# Patient Record
Sex: Female | Born: 1935
Health system: Southern US, Community
[De-identification: ages and names within clinical notes are randomized; demographics above are authoritative.]

## PROBLEM LIST (undated history)

## (undated) DIAGNOSIS — M1990 Unspecified osteoarthritis, unspecified site: Secondary | ICD-10-CM

## (undated) DIAGNOSIS — I5022 Chronic systolic (congestive) heart failure: Secondary | ICD-10-CM

## (undated) DIAGNOSIS — E785 Hyperlipidemia, unspecified: Secondary | ICD-10-CM

## (undated) DIAGNOSIS — H409 Unspecified glaucoma: Secondary | ICD-10-CM

## (undated) DIAGNOSIS — N3090 Cystitis, unspecified without hematuria: Secondary | ICD-10-CM

## (undated) DIAGNOSIS — I480 Paroxysmal atrial fibrillation: Secondary | ICD-10-CM

## (undated) DIAGNOSIS — H547 Unspecified visual loss: Secondary | ICD-10-CM

## (undated) DIAGNOSIS — I10 Essential (primary) hypertension: Secondary | ICD-10-CM

## (undated) DIAGNOSIS — I639 Cerebral infarction, unspecified: Secondary | ICD-10-CM

## (undated) DIAGNOSIS — I6381 Other cerebral infarction due to occlusion or stenosis of small artery: Secondary | ICD-10-CM

## (undated) DIAGNOSIS — E079 Disorder of thyroid, unspecified: Secondary | ICD-10-CM

## (undated) DIAGNOSIS — G8191 Hemiplegia, unspecified affecting right dominant side: Secondary | ICD-10-CM

## (undated) HISTORY — DX: Cystitis, unspecified without hematuria: N30.90

## (undated) HISTORY — DX: Essential (primary) hypertension: I10

## (undated) HISTORY — DX: Hemiplegia, unspecified affecting right dominant side: G81.91

## (undated) HISTORY — PX: TONSILLECTOMY AND ADENOIDECTOMY: SUR1326

## (undated) HISTORY — DX: Disorder of thyroid, unspecified: E07.9

## (undated) HISTORY — DX: Cerebral infarction, unspecified: I63.9

## (undated) HISTORY — DX: Unspecified glaucoma: H40.9

## (undated) HISTORY — DX: Hyperlipidemia, unspecified: E78.5

## (undated) HISTORY — DX: Unspecified osteoarthritis, unspecified site: M19.90

## (undated) HISTORY — PX: VAGINAL PROLAPSE REPAIR: SHX830

## (undated) HISTORY — DX: Other cerebral infarction due to occlusion or stenosis of small artery: I63.81

---

## 1974-02-23 HISTORY — PX: THYROID SURGERY: SHX805

## 1998-11-27 ENCOUNTER — Other Ambulatory Visit: Admission: RE | Admit: 1998-11-27 | Discharge: 1998-11-27 | Payer: Self-pay | Admitting: Cardiology

## 1999-05-21 ENCOUNTER — Encounter: Payer: Self-pay | Admitting: Cardiology

## 1999-05-21 ENCOUNTER — Encounter: Admission: RE | Admit: 1999-05-21 | Discharge: 1999-05-21 | Payer: Self-pay | Admitting: Cardiology

## 2000-01-28 ENCOUNTER — Ambulatory Visit (HOSPITAL_COMMUNITY): Admission: RE | Admit: 2000-01-28 | Discharge: 2000-01-28 | Payer: Self-pay | Admitting: Cardiovascular Disease

## 2000-05-15 ENCOUNTER — Encounter: Admission: RE | Admit: 2000-05-15 | Discharge: 2000-05-15 | Payer: Self-pay | Admitting: Orthopaedic Surgery

## 2000-05-15 ENCOUNTER — Encounter: Payer: Self-pay | Admitting: Orthopaedic Surgery

## 2000-05-25 ENCOUNTER — Encounter: Payer: Self-pay | Admitting: Internal Medicine

## 2000-05-25 ENCOUNTER — Encounter: Admission: RE | Admit: 2000-05-25 | Discharge: 2000-05-25 | Payer: Self-pay | Admitting: Internal Medicine

## 2000-05-26 ENCOUNTER — Encounter: Admission: RE | Admit: 2000-05-26 | Discharge: 2000-08-24 | Payer: Self-pay | Admitting: Orthopaedic Surgery

## 2000-05-27 ENCOUNTER — Encounter: Admission: RE | Admit: 2000-05-27 | Discharge: 2000-05-27 | Payer: Self-pay | Admitting: Orthopaedic Surgery

## 2000-05-27 ENCOUNTER — Encounter: Payer: Self-pay | Admitting: Orthopaedic Surgery

## 2000-06-11 ENCOUNTER — Encounter: Admission: RE | Admit: 2000-06-11 | Discharge: 2000-06-11 | Payer: Self-pay | Admitting: Orthopaedic Surgery

## 2000-06-11 ENCOUNTER — Encounter: Payer: Self-pay | Admitting: Orthopaedic Surgery

## 2000-06-25 ENCOUNTER — Encounter: Payer: Self-pay | Admitting: Orthopaedic Surgery

## 2000-06-25 ENCOUNTER — Encounter: Admission: RE | Admit: 2000-06-25 | Discharge: 2000-06-25 | Payer: Self-pay | Admitting: Orthopaedic Surgery

## 2000-08-11 ENCOUNTER — Ambulatory Visit (HOSPITAL_COMMUNITY): Admission: RE | Admit: 2000-08-11 | Discharge: 2000-08-11 | Payer: Self-pay | Admitting: Internal Medicine

## 2000-08-12 ENCOUNTER — Ambulatory Visit (HOSPITAL_COMMUNITY): Admission: RE | Admit: 2000-08-12 | Discharge: 2000-08-12 | Payer: Self-pay | Admitting: Internal Medicine

## 2000-08-12 ENCOUNTER — Encounter: Payer: Self-pay | Admitting: Internal Medicine

## 2000-08-19 ENCOUNTER — Encounter: Payer: Self-pay | Admitting: Internal Medicine

## 2000-09-07 ENCOUNTER — Other Ambulatory Visit: Admission: RE | Admit: 2000-09-07 | Discharge: 2000-09-07 | Payer: Self-pay | Admitting: Gynecology

## 2000-09-15 ENCOUNTER — Encounter: Admission: RE | Admit: 2000-09-15 | Discharge: 2000-09-15 | Payer: Self-pay | Admitting: Gynecology

## 2000-09-15 ENCOUNTER — Encounter: Payer: Self-pay | Admitting: Gynecology

## 2000-09-23 ENCOUNTER — Other Ambulatory Visit: Admission: RE | Admit: 2000-09-23 | Discharge: 2000-09-23 | Payer: Self-pay | Admitting: Gynecology

## 2000-11-11 ENCOUNTER — Encounter: Payer: Self-pay | Admitting: General Surgery

## 2000-11-11 ENCOUNTER — Encounter: Admission: RE | Admit: 2000-11-11 | Discharge: 2000-11-11 | Payer: Self-pay | Admitting: General Surgery

## 2001-03-07 ENCOUNTER — Ambulatory Visit (HOSPITAL_COMMUNITY): Admission: RE | Admit: 2001-03-07 | Discharge: 2001-03-07 | Payer: Self-pay | Admitting: General Surgery

## 2001-03-07 ENCOUNTER — Encounter: Payer: Self-pay | Admitting: General Surgery

## 2001-03-23 ENCOUNTER — Ambulatory Visit (HOSPITAL_COMMUNITY): Admission: RE | Admit: 2001-03-23 | Discharge: 2001-03-23 | Payer: Self-pay | Admitting: Gastroenterology

## 2001-06-28 ENCOUNTER — Encounter: Admission: RE | Admit: 2001-06-28 | Discharge: 2001-06-28 | Payer: Self-pay | Admitting: Internal Medicine

## 2001-06-28 ENCOUNTER — Encounter: Payer: Self-pay | Admitting: Internal Medicine

## 2001-09-20 ENCOUNTER — Other Ambulatory Visit: Admission: RE | Admit: 2001-09-20 | Discharge: 2001-09-20 | Payer: Self-pay | Admitting: Gynecology

## 2002-09-06 ENCOUNTER — Encounter: Payer: Self-pay | Admitting: Gynecology

## 2002-09-06 ENCOUNTER — Encounter: Admission: RE | Admit: 2002-09-06 | Discharge: 2002-09-06 | Payer: Self-pay | Admitting: Gynecology

## 2005-03-08 ENCOUNTER — Emergency Department (HOSPITAL_COMMUNITY): Admission: EM | Admit: 2005-03-08 | Discharge: 2005-03-08 | Payer: Self-pay | Admitting: Emergency Medicine

## 2005-05-28 ENCOUNTER — Encounter: Admission: RE | Admit: 2005-05-28 | Discharge: 2005-05-28 | Payer: Self-pay | Admitting: Internal Medicine

## 2006-01-17 ENCOUNTER — Emergency Department (HOSPITAL_COMMUNITY): Admission: EM | Admit: 2006-01-17 | Discharge: 2006-01-17 | Payer: Self-pay | Admitting: Emergency Medicine

## 2009-10-04 ENCOUNTER — Ambulatory Visit: Payer: Self-pay | Admitting: Cardiovascular Disease

## 2010-07-11 NOTE — Procedures (Signed)
[facility]  Patient:    Amanda Cochran, Amanda Cochran Visit Number: [id number] MRN: [id number]          Service Type: END Location: ENDO Attending Physician:  Louie Bun Dictated by:   Everardo All Madilyn Fireman, M.D. Proc. Date: 03/23/01 Admit Date:  03/23/2001   CC:         Sharlet Salina T. Hoxworth, M.D.   Procedure Report  PROCEDURE:  Esophagogastroduodenoscopy with biopsy.  SURGEON:  John C. Madilyn Fireman, M.D.  INDICATIONS FOR PROCEDURE:  The patient undergoing colonoscopy due to right lower quadrant abdominal pain and a family history of colon polyps, who has also had refractory reflux symptoms and other dyspeptic symptoms.  The procedure is to assess for any mucosal or structural abnormality of the upper GI tract to enable better understanding and treatment of her symptoms.  DESCRIPTION OF PROCEDURE:  The patient was placed in the left lateral decubitus position, and placed on the pulse monitor with continuous low flow oxygen delivered by nasal cannula.  She was sedated with 50 mg of IV Demerol and 5 mg of IV Versed.  The Olympus video endoscope was advanced under direct vision into the oropharynx and esophagus.  The esophagus was straight and of normal caliber with the squamocolumnar line at 38 cm.  It was somewhat irregular, but there were no discrete linear erosions or exudate, and no visible stricture, ring, or hiatal hernia.  The appearance was consistent with minimal to mild erosive esophagitis.  The stomach was entered and a small amount of liquid secretions were suctioned from the fundus.  Retroflexed view of the cardia was unremarkable.  The fundus and body appeared normal.  The antrum showed a few patches of erythema and granularity consistent with mild gastritis with no focal erosions or ulcers.  A CLOtest was obtained.  The pylorus was not deformed and easily allowed passage of the endoscope tip into the duodenum, but the bulb and second portion were  well-inspected and appeared to be within normal limits.  The endoscope was then withdrawn and the patient returned to the recovery room in stable condition.  She tolerated the procedure well and there were no immediate complications.  IMPRESSION:  Minimal esophagitis and minimal to mild gastritis.  PLAN:  Await CLOtest and treat for irradication of Helicobacter if positive. Otherwise, consider proton pump inhibitor in place of her H2 blocker. Dictated by:   Everardo All Madilyn Fireman, M.D. Attending Physician:  Louie Bun DD:  03/23/01 TD:  03/24/01 Job: [id number]

## 2010-07-11 NOTE — Cardiovascular Report (Signed)
[place]  Patient:    Amanda Cochran, Amanda Cochran                         MRN: [id number] Proc. Date: 01/28/00 Adm. Date:  [id number] Disc. Date: [id number] Attending:  Koren Bound CC:         [facility]   Cardiac Catheterization  HISTORY:  Ms. Panchal is a 77 year old female with a history of chest pain and exertional dyspnea.  She is referred for heart catheterization for further evaluation.  ACCESS:  The right femoral artery was easily cannulated using a modified Seldinger technique.  HEMODYNAMICS:  The LV pressure is 189/18 with an aortic pressure of 187/70.  ANGIOGRAPHY:  The left main coronary artery is relatively short and is otherwise smooth and normal.  The left anterior descending artery is a moderate-sized vessel.  It is smooth and normal throughout its course.  It gives off a large diagonal vessel and then tapers fairly significantly as it reaches the apex.  There are no discrete stenoses.  The left circumflex artery is a moderate-sized vessel.  It gives off a high obtuse marginal artery.  The distal left circumflex artery is fairly tortuous but is otherwise normal.  There are no discrete stenoses.  The right coronary artery has minor luminal irregularities.  There are diffuse 10-20% stenoses in the proximal and mid-segments.  The posterior descending artery and the posterolateral segment artery are unremarkable.  LEFT VENTRICULOGRAM:  The left ventriculogram was performed in a 30 RAO position.  It reveals overall normal left ventricular systolic function with an ejection fraction of 60%.  There is no mitral regurgitation.  An aortic root shot was performed because of her very brisk pulses.  There was no evidence of aortic insufficiency.  COMPLICATIONS:  None.  CONCLUSIONS 1. Minor coronary irregularities. 2. Normal left ventricular systolic function. 3. No evidence of aortic insufficiency. DD:  02/27/00 TD:   02/27/00 Job: [id number]

## 2010-07-11 NOTE — Procedures (Signed)
[facility]  Patient:    Amanda Cochran, PACCIONE Visit Number: [id number] MRN: [id number]          Service Type: END Location: ENDO Attending Physician:  Louie Bun Dictated by:   Everardo All Madilyn Fireman, M.D. Proc. Date: 03/23/01 Admit Date:  03/23/2001   CC:         Sharlet Salina T. Hoxworth, M.D.   Procedure Report  PROCEDURE:  Colonoscopy.  SURGEON:  John C. Madilyn Fireman, M.D.  INDICATIONS FOR PROCEDURE:  Right lower quadrant abdominal pain and family history of colon polyps in a first degree relative.  DESCRIPTION OF PROCEDURE:  The patient was placed in the left lateral decubitus position and placed on the pulse monitor with continuous low flow oxygen delivered by nasal cannula.  She was sedated with 20 mg of IV Demerol and 2 mg of IV Versed in addition to the medicine given for the previous EGD. The Olympus video colonoscope was inserted into the rectum and advanced to the cecum, confirmed by transillumination at McBurneys point, and visualization of the ileocecal valve and appendiceal orifice.  The prep was excellent.  The terminal ileum was intubated and explored for several centimeters and appeared to be within normal limits.  The cecum, ascending, and transverse colon appeared normal with no masses, polyps, diverticula, or other mucosal abnormalities.  In the descending and sigmoid colon, there were seen several scattered diverticula with no other abnormality seen.  The rectum appeared normal and retroflexed view of the anus revealed no obvious ________ or internal hemorrhoids.  The colonoscope was then withdrawn and the patient returned to the recovery room in stable condition.  She tolerated the procedure well and there were no immediate complications.  IMPRESSION:  Diverticulosis, otherwise normal colonoscopy.  PLAN:  Consider repeat colonoscopy or flexible sigmoidoscopy in five years. Dictated by:   Everardo All Madilyn Fireman, M.D. Attending Physician:  Louie Bun DD:  03/23/01 TD:  03/24/01 Job: [id number]

## 2011-05-25 DIAGNOSIS — H4011X0 Primary open-angle glaucoma, stage unspecified: Secondary | ICD-10-CM | POA: Diagnosis not present

## 2011-05-25 DIAGNOSIS — H409 Unspecified glaucoma: Secondary | ICD-10-CM | POA: Diagnosis not present

## 2011-05-25 DIAGNOSIS — H25019 Cortical age-related cataract, unspecified eye: Secondary | ICD-10-CM | POA: Diagnosis not present

## 2011-05-25 DIAGNOSIS — H353190 Nonexudative age-related macular degeneration, unspecified eye, stage unspecified: Secondary | ICD-10-CM | POA: Diagnosis not present

## 2011-06-09 DIAGNOSIS — H4010X0 Unspecified open-angle glaucoma, stage unspecified: Secondary | ICD-10-CM | POA: Diagnosis not present

## 2011-06-09 DIAGNOSIS — H2510 Age-related nuclear cataract, unspecified eye: Secondary | ICD-10-CM | POA: Diagnosis not present

## 2011-06-09 DIAGNOSIS — H353190 Nonexudative age-related macular degeneration, unspecified eye, stage unspecified: Secondary | ICD-10-CM | POA: Diagnosis not present

## 2011-06-24 DIAGNOSIS — M1990 Unspecified osteoarthritis, unspecified site: Secondary | ICD-10-CM | POA: Diagnosis not present

## 2011-06-24 DIAGNOSIS — M899 Disorder of bone, unspecified: Secondary | ICD-10-CM | POA: Diagnosis not present

## 2011-06-24 DIAGNOSIS — R7301 Impaired fasting glucose: Secondary | ICD-10-CM | POA: Diagnosis not present

## 2011-06-24 DIAGNOSIS — I10 Essential (primary) hypertension: Secondary | ICD-10-CM | POA: Diagnosis not present

## 2011-06-24 DIAGNOSIS — E039 Hypothyroidism, unspecified: Secondary | ICD-10-CM | POA: Diagnosis not present

## 2011-06-24 DIAGNOSIS — M949 Disorder of cartilage, unspecified: Secondary | ICD-10-CM | POA: Diagnosis not present

## 2011-07-16 DIAGNOSIS — L723 Sebaceous cyst: Secondary | ICD-10-CM | POA: Diagnosis not present

## 2011-07-16 DIAGNOSIS — L570 Actinic keratosis: Secondary | ICD-10-CM | POA: Diagnosis not present

## 2011-07-22 DIAGNOSIS — H353190 Nonexudative age-related macular degeneration, unspecified eye, stage unspecified: Secondary | ICD-10-CM | POA: Diagnosis not present

## 2011-07-22 DIAGNOSIS — H409 Unspecified glaucoma: Secondary | ICD-10-CM | POA: Diagnosis not present

## 2011-07-22 DIAGNOSIS — H4011X0 Primary open-angle glaucoma, stage unspecified: Secondary | ICD-10-CM | POA: Diagnosis not present

## 2011-07-24 DIAGNOSIS — H409 Unspecified glaucoma: Secondary | ICD-10-CM | POA: Diagnosis not present

## 2011-07-24 DIAGNOSIS — H4011X0 Primary open-angle glaucoma, stage unspecified: Secondary | ICD-10-CM | POA: Diagnosis not present

## 2011-08-05 DIAGNOSIS — H409 Unspecified glaucoma: Secondary | ICD-10-CM | POA: Diagnosis not present

## 2011-08-05 DIAGNOSIS — H2510 Age-related nuclear cataract, unspecified eye: Secondary | ICD-10-CM | POA: Diagnosis not present

## 2011-08-05 DIAGNOSIS — H4011X0 Primary open-angle glaucoma, stage unspecified: Secondary | ICD-10-CM | POA: Diagnosis not present

## 2011-09-18 DIAGNOSIS — M25559 Pain in unspecified hip: Secondary | ICD-10-CM | POA: Diagnosis not present

## 2011-09-18 DIAGNOSIS — M47814 Spondylosis without myelopathy or radiculopathy, thoracic region: Secondary | ICD-10-CM | POA: Diagnosis not present

## 2011-09-18 DIAGNOSIS — M47817 Spondylosis without myelopathy or radiculopathy, lumbosacral region: Secondary | ICD-10-CM | POA: Diagnosis not present

## 2011-09-18 DIAGNOSIS — M5137 Other intervertebral disc degeneration, lumbosacral region: Secondary | ICD-10-CM | POA: Diagnosis not present

## 2011-10-12 DIAGNOSIS — H4011X0 Primary open-angle glaucoma, stage unspecified: Secondary | ICD-10-CM | POA: Diagnosis not present

## 2011-10-12 DIAGNOSIS — H409 Unspecified glaucoma: Secondary | ICD-10-CM | POA: Diagnosis not present

## 2011-11-02 ENCOUNTER — Encounter: Payer: Self-pay | Admitting: Cardiovascular Disease

## 2011-11-19 DIAGNOSIS — D235 Other benign neoplasm of skin of trunk: Secondary | ICD-10-CM | POA: Diagnosis not present

## 2011-11-25 ENCOUNTER — Encounter: Payer: Self-pay | Admitting: Cardiovascular Disease

## 2011-12-23 DIAGNOSIS — Z23 Encounter for immunization: Secondary | ICD-10-CM | POA: Diagnosis not present

## 2012-02-02 DIAGNOSIS — H4011X0 Primary open-angle glaucoma, stage unspecified: Secondary | ICD-10-CM | POA: Diagnosis not present

## 2012-02-02 DIAGNOSIS — H409 Unspecified glaucoma: Secondary | ICD-10-CM | POA: Diagnosis not present

## 2012-03-01 DIAGNOSIS — M899 Disorder of bone, unspecified: Secondary | ICD-10-CM | POA: Diagnosis not present

## 2012-03-01 DIAGNOSIS — R82998 Other abnormal findings in urine: Secondary | ICD-10-CM | POA: Diagnosis not present

## 2012-03-01 DIAGNOSIS — I10 Essential (primary) hypertension: Secondary | ICD-10-CM | POA: Diagnosis not present

## 2012-03-01 DIAGNOSIS — M949 Disorder of cartilage, unspecified: Secondary | ICD-10-CM | POA: Diagnosis not present

## 2012-03-01 DIAGNOSIS — E039 Hypothyroidism, unspecified: Secondary | ICD-10-CM | POA: Diagnosis not present

## 2012-03-01 DIAGNOSIS — E785 Hyperlipidemia, unspecified: Secondary | ICD-10-CM | POA: Diagnosis not present

## 2012-03-01 DIAGNOSIS — R7301 Impaired fasting glucose: Secondary | ICD-10-CM | POA: Diagnosis not present

## 2012-03-05 DIAGNOSIS — R6889 Other general symptoms and signs: Secondary | ICD-10-CM | POA: Diagnosis not present

## 2012-03-08 DIAGNOSIS — G459 Transient cerebral ischemic attack, unspecified: Secondary | ICD-10-CM | POA: Diagnosis not present

## 2012-03-08 DIAGNOSIS — Z0000 Encounter for general adult medical examination without abnormal findings: Secondary | ICD-10-CM | POA: Diagnosis not present

## 2012-03-08 DIAGNOSIS — M1990 Unspecified osteoarthritis, unspecified site: Secondary | ICD-10-CM | POA: Diagnosis not present

## 2012-03-08 DIAGNOSIS — I4949 Other premature depolarization: Secondary | ICD-10-CM | POA: Diagnosis not present

## 2012-03-08 DIAGNOSIS — Z124 Encounter for screening for malignant neoplasm of cervix: Secondary | ICD-10-CM | POA: Diagnosis not present

## 2012-03-09 DIAGNOSIS — J342 Deviated nasal septum: Secondary | ICD-10-CM | POA: Diagnosis not present

## 2012-03-09 DIAGNOSIS — Z8673 Personal history of transient ischemic attack (TIA), and cerebral infarction without residual deficits: Secondary | ICD-10-CM | POA: Diagnosis not present

## 2012-03-14 DIAGNOSIS — Z1212 Encounter for screening for malignant neoplasm of rectum: Secondary | ICD-10-CM | POA: Diagnosis not present

## 2012-03-22 DIAGNOSIS — I658 Occlusion and stenosis of other precerebral arteries: Secondary | ICD-10-CM | POA: Diagnosis not present

## 2012-03-22 DIAGNOSIS — G459 Transient cerebral ischemic attack, unspecified: Secondary | ICD-10-CM | POA: Diagnosis not present

## 2012-03-22 DIAGNOSIS — I6350 Cerebral infarction due to unspecified occlusion or stenosis of unspecified cerebral artery: Secondary | ICD-10-CM | POA: Diagnosis not present

## 2012-07-05 DIAGNOSIS — H409 Unspecified glaucoma: Secondary | ICD-10-CM | POA: Diagnosis not present

## 2012-07-05 DIAGNOSIS — H4011X0 Primary open-angle glaucoma, stage unspecified: Secondary | ICD-10-CM | POA: Diagnosis not present

## 2012-11-21 DIAGNOSIS — D235 Other benign neoplasm of skin of trunk: Secondary | ICD-10-CM | POA: Diagnosis not present

## 2012-11-21 DIAGNOSIS — L570 Actinic keratosis: Secondary | ICD-10-CM | POA: Diagnosis not present

## 2012-11-21 DIAGNOSIS — D485 Neoplasm of uncertain behavior of skin: Secondary | ICD-10-CM | POA: Diagnosis not present

## 2012-12-22 DIAGNOSIS — Z23 Encounter for immunization: Secondary | ICD-10-CM | POA: Diagnosis not present

## 2013-02-07 DIAGNOSIS — H409 Unspecified glaucoma: Secondary | ICD-10-CM | POA: Diagnosis not present

## 2013-02-07 DIAGNOSIS — H4011X0 Primary open-angle glaucoma, stage unspecified: Secondary | ICD-10-CM | POA: Diagnosis not present

## 2013-02-23 HISTORY — PX: OTHER SURGICAL HISTORY: SHX169

## 2013-03-21 DIAGNOSIS — E785 Hyperlipidemia, unspecified: Secondary | ICD-10-CM | POA: Diagnosis not present

## 2013-03-21 DIAGNOSIS — E559 Vitamin D deficiency, unspecified: Secondary | ICD-10-CM | POA: Diagnosis not present

## 2013-03-21 DIAGNOSIS — R82998 Other abnormal findings in urine: Secondary | ICD-10-CM | POA: Diagnosis not present

## 2013-03-21 DIAGNOSIS — I10 Essential (primary) hypertension: Secondary | ICD-10-CM | POA: Diagnosis not present

## 2013-03-21 DIAGNOSIS — E039 Hypothyroidism, unspecified: Secondary | ICD-10-CM | POA: Diagnosis not present

## 2013-03-21 DIAGNOSIS — R7301 Impaired fasting glucose: Secondary | ICD-10-CM | POA: Diagnosis not present

## 2013-03-28 DIAGNOSIS — Z23 Encounter for immunization: Secondary | ICD-10-CM | POA: Diagnosis not present

## 2013-03-28 DIAGNOSIS — IMO0002 Reserved for concepts with insufficient information to code with codable children: Secondary | ICD-10-CM | POA: Diagnosis not present

## 2013-03-28 DIAGNOSIS — Z0000 Encounter for general adult medical examination without abnormal findings: Secondary | ICD-10-CM | POA: Diagnosis not present

## 2013-03-28 DIAGNOSIS — E039 Hypothyroidism, unspecified: Secondary | ICD-10-CM | POA: Diagnosis not present

## 2013-03-28 DIAGNOSIS — E785 Hyperlipidemia, unspecified: Secondary | ICD-10-CM | POA: Diagnosis not present

## 2013-03-28 DIAGNOSIS — E559 Vitamin D deficiency, unspecified: Secondary | ICD-10-CM | POA: Diagnosis not present

## 2013-03-28 DIAGNOSIS — F3289 Other specified depressive episodes: Secondary | ICD-10-CM | POA: Diagnosis not present

## 2013-03-28 DIAGNOSIS — M25519 Pain in unspecified shoulder: Secondary | ICD-10-CM | POA: Diagnosis not present

## 2013-03-28 DIAGNOSIS — I10 Essential (primary) hypertension: Secondary | ICD-10-CM | POA: Diagnosis not present

## 2013-03-28 DIAGNOSIS — F329 Major depressive disorder, single episode, unspecified: Secondary | ICD-10-CM | POA: Diagnosis not present

## 2013-03-31 DIAGNOSIS — Z1212 Encounter for screening for malignant neoplasm of rectum: Secondary | ICD-10-CM | POA: Diagnosis not present

## 2013-04-05 ENCOUNTER — Emergency Department (HOSPITAL_COMMUNITY): Payer: Medicare Other

## 2013-04-05 ENCOUNTER — Emergency Department (HOSPITAL_COMMUNITY)
Admission: EM | Admit: 2013-04-05 | Discharge: 2013-04-05 | Disposition: A | Discharge status: Home / Self Care | Payer: Medicare Other | Attending: Emergency Medicine | Admitting: Emergency Medicine

## 2013-04-05 ENCOUNTER — Encounter (HOSPITAL_COMMUNITY): Payer: Self-pay | Admitting: Emergency Medicine

## 2013-04-05 DIAGNOSIS — Z87448 Personal history of other diseases of urinary system: Secondary | ICD-10-CM | POA: Insufficient documentation

## 2013-04-05 DIAGNOSIS — Z79899 Other long term (current) drug therapy: Secondary | ICD-10-CM | POA: Diagnosis not present

## 2013-04-05 DIAGNOSIS — R51 Headache: Secondary | ICD-10-CM | POA: Diagnosis not present

## 2013-04-05 DIAGNOSIS — R112 Nausea with vomiting, unspecified: Secondary | ICD-10-CM | POA: Diagnosis not present

## 2013-04-05 DIAGNOSIS — Z8673 Personal history of transient ischemic attack (TIA), and cerebral infarction without residual deficits: Secondary | ICD-10-CM | POA: Diagnosis not present

## 2013-04-05 DIAGNOSIS — R209 Unspecified disturbances of skin sensation: Secondary | ICD-10-CM | POA: Insufficient documentation

## 2013-04-05 DIAGNOSIS — I10 Essential (primary) hypertension: Secondary | ICD-10-CM | POA: Insufficient documentation

## 2013-04-05 DIAGNOSIS — E785 Hyperlipidemia, unspecified: Secondary | ICD-10-CM | POA: Insufficient documentation

## 2013-04-05 DIAGNOSIS — R1110 Vomiting, unspecified: Secondary | ICD-10-CM | POA: Diagnosis not present

## 2013-04-05 DIAGNOSIS — R42 Dizziness and giddiness: Secondary | ICD-10-CM | POA: Diagnosis not present

## 2013-04-05 DIAGNOSIS — Z7982 Long term (current) use of aspirin: Secondary | ICD-10-CM | POA: Diagnosis not present

## 2013-04-05 DIAGNOSIS — M129 Arthropathy, unspecified: Secondary | ICD-10-CM | POA: Insufficient documentation

## 2013-04-05 DIAGNOSIS — H409 Unspecified glaucoma: Secondary | ICD-10-CM | POA: Insufficient documentation

## 2013-04-05 DIAGNOSIS — E079 Disorder of thyroid, unspecified: Secondary | ICD-10-CM | POA: Diagnosis not present

## 2013-04-05 LAB — CBC
HEMATOCRIT: 45.9 % (ref 36.0–46.0)
Hemoglobin: 15.6 g/dL — ABNORMAL HIGH (ref 12.0–15.0)
MCH: 29.6 pg (ref 26.0–34.0)
MCHC: 34 g/dL (ref 30.0–36.0)
MCV: 87.1 fL (ref 78.0–100.0)
PLATELETS: 279 10*3/uL (ref 150–400)
RBC: 5.27 MIL/uL — ABNORMAL HIGH (ref 3.87–5.11)
RDW: 14.1 % (ref 11.5–15.5)
WBC: 11.6 10*3/uL — AB (ref 4.0–10.5)

## 2013-04-05 LAB — URINALYSIS, ROUTINE W REFLEX MICROSCOPIC
Bilirubin Urine: NEGATIVE
Glucose, UA: NEGATIVE mg/dL
KETONES UR: 15 mg/dL — AB
NITRITE: NEGATIVE
Protein, ur: NEGATIVE mg/dL
SPECIFIC GRAVITY, URINE: 1.019 (ref 1.005–1.030)
UROBILINOGEN UA: 0.2 mg/dL (ref 0.0–1.0)
pH: 8 (ref 5.0–8.0)

## 2013-04-05 LAB — POCT I-STAT, CHEM 8
BUN: 16 mg/dL (ref 6–23)
CALCIUM ION: 1.2 mmol/L (ref 1.13–1.30)
CHLORIDE: 105 meq/L (ref 96–112)
CREATININE: 0.9 mg/dL (ref 0.50–1.10)
Glucose, Bld: 105 mg/dL — ABNORMAL HIGH (ref 70–99)
HCT: 49 % — ABNORMAL HIGH (ref 36.0–46.0)
Hemoglobin: 16.7 g/dL — ABNORMAL HIGH (ref 12.0–15.0)
POTASSIUM: 3.8 meq/L (ref 3.7–5.3)
Sodium: 146 mEq/L (ref 137–147)
TCO2: 24 mmol/L (ref 0–100)

## 2013-04-05 LAB — HEPATIC FUNCTION PANEL
ALBUMIN: 4.1 g/dL (ref 3.5–5.2)
ALT: 14 U/L (ref 0–35)
AST: 25 U/L (ref 0–37)
Alkaline Phosphatase: 62 U/L (ref 39–117)
Bilirubin, Direct: <0.2 mg/dL (ref 0.0–0.3)
TOTAL PROTEIN: 8 g/dL (ref 6.0–8.3)
Total Bilirubin: 0.2 mg/dL — ABNORMAL LOW (ref 0.3–1.2)

## 2013-04-05 LAB — BASIC METABOLIC PANEL
BUN: 17 mg/dL (ref 6–23)
CALCIUM: 9.9 mg/dL (ref 8.4–10.5)
CO2: 23 mEq/L (ref 19–32)
CREATININE: 0.81 mg/dL (ref 0.50–1.10)
Chloride: 105 mEq/L (ref 96–112)
GFR calc Af Amer: 76 mL/min — ABNORMAL LOW (ref >90)
GFR calc non Af Amer: 66 mL/min — ABNORMAL LOW (ref >90)
GLUCOSE: 104 mg/dL — AB (ref 70–99)
Potassium: 4.1 mEq/L (ref 3.7–5.3)
SODIUM: 145 meq/L (ref 137–147)

## 2013-04-05 LAB — URINE MICROSCOPIC-ADD ON

## 2013-04-05 LAB — POCT I-STAT TROPONIN I: Troponin i, poc: 0.04 ng/mL (ref 0.00–0.08)

## 2013-04-05 MED ORDER — MECLIZINE HCL 25 MG PO TABS
25.0000 mg | ORAL_TABLET | Freq: Once | ORAL | Status: AC
  Administered 2013-04-05: 25 mg via ORAL
  Filled 2013-04-05: qty 1

## 2013-04-05 MED ORDER — MECLIZINE HCL 25 MG PO TABS: 25.0000 mg | ORAL_TABLET | Freq: Four times a day (QID) | ORAL | Status: DC

## 2013-04-05 MED ORDER — ONDANSETRON 4 MG PO TBDP: 4.0000 mg | ORAL_TABLET | Freq: Three times a day (TID) | ORAL | Status: DC | PRN

## 2013-04-05 MED ORDER — ONDANSETRON HCL 4 MG/2ML IJ SOLN
4.0000 mg | Freq: Once | INTRAMUSCULAR | Status: AC
  Administered 2013-04-05: 4 mg via INTRAVENOUS
  Filled 2013-04-05: qty 2

## 2013-04-05 NOTE — Discharge Instructions (Signed)
MRI of the brain without any new acute findings. Cleared by neurology for discharge home. Make an appointment with your regular Dr. for followup. Make an appointment with ear nose and throat for evaluation of the ears. Take the anti-GERD as directed take Zofran as needed for nausea and vomiting. Return for any newer worse symptoms.   Benign Positional Vertigo Vertigo means you feel like you or your surroundings are moving when they are not. Benign positional vertigo is the most common form of vertigo. Benign means that the cause of your condition is not serious. Benign positional vertigo is more common in older adults. CAUSES  Benign positional vertigo is the result of an upset in the labyrinth system. This is an area in the middle ear that helps control your balance. This may be caused by a viral infection, head injury, or repetitive motion. However, often no specific cause is found. SYMPTOMS  Symptoms of benign positional vertigo occur when you move your head or eyes in different directions. Some of the symptoms may include:  Loss of balance and falls.  Vomiting.  Blurred vision.  Dizziness.  Nausea.  Involuntary eye movements (nystagmus). DIAGNOSIS  Benign positional vertigo is usually diagnosed by physical exam. If the specific cause of your benign positional vertigo is unknown, your caregiver may perform imaging tests, such as magnetic resonance imaging (MRI) or computed tomography (CT). TREATMENT  Your caregiver may recommend movements or procedures to correct the benign positional vertigo. Medicines such as meclizine, benzodiazepines, and medicines for nausea may be used to treat your symptoms. In rare cases, if your symptoms are caused by certain conditions that affect the inner ear, you may need surgery. HOME CARE INSTRUCTIONS   Follow your caregiver's instructions.  Move slowly. Do not make sudden body or head movements.  Avoid driving.  Avoid operating heavy  machinery.  Avoid performing any tasks that would be dangerous to you or others during a vertigo episode.  Drink enough fluids to keep your urine clear or pale yellow. SEEK IMMEDIATE MEDICAL CARE IF:   You develop problems with walking, weakness, numbness, or using your arms, hands, or legs.  You have difficulty speaking.  You develop severe headaches.  Your nausea or vomiting continues or gets worse.  You develop visual changes.  Your family or friends notice any behavioral changes.  Your condition gets worse.  You have a fever.  You develop a stiff neck or sensitivity to light. MAKE SURE YOU:   Understand these instructions.  Will watch your condition.  Will get help right away if you are not doing well or get worse. Document Released: 11/17/2005 Document Revised: 05/04/2011 Document Reviewed: 10/30/2010 [facility] Patient Information 2014 [place].

## 2013-04-05 NOTE — ED Notes (Signed)
Reminded Patient we need a urine sample.  Patient stated she was so dry.  Patient has call light at bedside.

## 2013-04-05 NOTE — ED Provider Notes (Signed)
CSN: [id number]     Arrival date & time 04/05/13  1404 History   First MD Initiated Contact with Patient 04/05/13 1512     Chief Complaint  Patient presents with  . Dizziness     (Consider location/radiation/quality/duration/timing/severity/associated sxs/prior Treatment) Patient is a 80 y.o. female presenting with dizziness. The history is provided by the patient.  Dizziness Associated symptoms: headaches, nausea and vomiting   Associated symptoms: no chest pain and no shortness of breath    patient with acute onset of dizziness with vertigo this morning around 4. It persisted throughout the day patient eventually got her neighbor to bring her in. Patient complains of some dizziness mild headache nausea vomited once. Patient had similar episodes of dizziness vertigo 13 years ago but none since. Patient has a history of strokes. No new focal deficits. Patient does have a some numbness to the left arm that is chronic in nature.  Past Medical History  Diagnosis Date  . Thyroid disease   . Hyperlipidemia   . Hypertension   . Glaucoma   . Arthritis   . Bladder infection    Past Surgical History  Procedure Laterality Date  . Thyroid surgery  1976  . Tonsillectomy and adenoidectomy    . Vaginal prolapse repair     No family history on file. History  Substance Use Topics  . Smoking status: Never Smoker   . Smokeless tobacco: Not on file  . Alcohol Use: No   OB History   Grav Para Term Preterm Abortions TAB SAB Ect Mult Living                 Review of Systems  Constitutional: Negative for fever and fatigue.  HENT: Negative for ear pain.   Eyes: Negative for visual disturbance.  Respiratory: Negative for shortness of breath.   Cardiovascular: Negative for chest pain.  Gastrointestinal: Positive for nausea and vomiting. Negative for abdominal pain.  Genitourinary: Negative for dysuria.  Musculoskeletal: Negative for back pain.  Skin: Negative for rash.  Neurological:  Positive for dizziness, numbness and headaches.  Psychiatric/Behavioral: Negative for confusion.      Allergies  Review of patient's allergies indicates no known allergies.  Home Medications   Current Outpatient Rx  Name  Route  Sig  Dispense  Refill  . aspirin 325 MG tablet   Oral   Take 325 mg by mouth at bedtime.         . benazepril (LOTENSIN) 40 MG tablet   Oral   Take 40 mg by mouth daily.         . brinzolamide (AZOPT) 1 % ophthalmic suspension   Both Eyes   Place 1 drop into both eyes 2 (two) times daily.          . Calcium Carbonate (CALTRATE 600 PO)   Oral   Take by mouth. 1 tab twice a day         . Cholecalciferol (VITAMIN D) 2000 UNITS CAPS   Oral   Take 1 capsule by mouth daily.         Marland Kitchen escitalopram (LEXAPRO) 10 MG tablet   Oral   Take 10 mg by mouth at bedtime.         . Fish Oil-Cholecalciferol (FISH OIL + D3) 1200-1000 MG-UNIT CAPS   Oral   Take by mouth.         . latanoprost (XALATAN) 0.005 % ophthalmic solution      1 drop at bedtime.         Marland Kitchen  levothyroxine (SYNTHROID, LEVOTHROID) 100 MCG tablet   Oral   Take 100 mcg by mouth daily.         . Multiple Vitamins-Minerals (CENTRUM SILVER PO)   Oral   Take 1 tablet by mouth daily.         . simvastatin (ZOCOR) 40 MG tablet   Oral   Take 40 mg by mouth every evening.         . meclizine (ANTIVERT) 25 MG tablet   Oral   Take 1 tablet (25 mg total) by mouth 4 (four) times daily.   28 tablet   0   . ondansetron (ZOFRAN ODT) 4 MG disintegrating tablet   Oral   Take 1 tablet (4 mg total) by mouth every 8 (eight) hours as needed for nausea or vomiting.   20 tablet   0    BP 164/68  Pulse 39  Temp(Src) 97.6 F (36.4 C) (Oral)  Resp 16  Wt 153 lb (69.4 kg)  SpO2 93% Physical Exam  Nursing note and vitals reviewed. Constitutional: She is oriented to person, place, and time. She appears well-developed and well-nourished. No distress.  HENT:  Head:  Normocephalic and atraumatic.  Mouth/Throat: Oropharynx is clear and moist.  Eyes: Conjunctivae and EOM are normal. Pupils are equal, round, and reactive to light.  Neck: Normal range of motion. Neck supple.  Cardiovascular: Normal rate, regular rhythm and normal heart sounds.   Pulmonary/Chest: Effort normal and breath sounds normal. No respiratory distress.  Abdominal: Soft. Bowel sounds are normal. There is no tenderness.  Musculoskeletal: Normal range of motion. She exhibits no edema.  Neurological: She is alert and oriented to person, place, and time. No cranial nerve deficit. She exhibits normal muscle tone. Coordination normal.  Skin: Skin is warm. No rash noted.    ED Course  Procedures (including critical care time) Labs Review Labs Reviewed  CBC - Abnormal; Notable for the following:    WBC 11.6 (*)    RBC 5.27 (*)    Hemoglobin 15.6 (*)    All other components within normal limits  BASIC METABOLIC PANEL - Abnormal; Notable for the following:    Glucose, Bld 104 (*)    GFR calc non Af Amer 66 (*)    GFR calc Af Amer 76 (*)    All other components within normal limits  URINALYSIS, ROUTINE W REFLEX MICROSCOPIC - Abnormal; Notable for the following:    APPearance CLOUDY (*)    Hgb urine dipstick TRACE (*)    Ketones, ur 15 (*)    Leukocytes, UA LARGE (*)    All other components within normal limits  HEPATIC FUNCTION PANEL - Abnormal; Notable for the following:    Total Bilirubin 0.2 (*)    All other components within normal limits  URINE MICROSCOPIC-ADD ON - Abnormal; Notable for the following:    Squamous Epithelial / LPF MANY (*)    Bacteria, UA MANY (*)    All other components within normal limits  POCT I-STAT, CHEM 8 - Abnormal; Notable for the following:    Glucose, Bld 105 (*)    Hemoglobin 16.7 (*)    HCT 49.0 (*)    All other components within normal limits  URINE CULTURE  POCT I-STAT TROPONIN I   Results for orders placed during the hospital encounter of  04/05/13  CBC      Result Value Ref Range   WBC 11.6 (*) 4.0 - 10.5 K/uL   RBC 5.27 (*) 3.87 -  5.11 MIL/uL   Hemoglobin 15.6 (*) 12.0 - 15.0 g/dL   HCT 45.9  36.0 - 46.0 %   MCV 87.1  78.0 - 100.0 fL   MCH 29.6  26.0 - 34.0 pg   MCHC 34.0  30.0 - 36.0 g/dL   RDW 14.1  11.5 - 15.5 %   Platelets 279  150 - 400 K/uL  BASIC METABOLIC PANEL      Result Value Ref Range   Sodium 145  137 - 147 mEq/L   Potassium 4.1  3.7 - 5.3 mEq/L   Chloride 105  96 - 112 mEq/L   CO2 23  19 - 32 mEq/L   Glucose, Bld 104 (*) 70 - 99 mg/dL   BUN 17  6 - 23 mg/dL   Creatinine, Ser 0.81  0.50 - 1.10 mg/dL   Calcium 9.9  8.4 - 10.5 mg/dL   GFR calc non Af Amer 66 (*) >90 mL/min   GFR calc Af Amer 76 (*) >90 mL/min  URINALYSIS, ROUTINE W REFLEX MICROSCOPIC      Result Value Ref Range   Color, Urine YELLOW  YELLOW   APPearance CLOUDY (*) CLEAR   Specific Gravity, Urine 1.019  1.005 - 1.030   pH 8.0  5.0 - 8.0   Glucose, UA NEGATIVE  NEGATIVE mg/dL   Hgb urine dipstick TRACE (*) NEGATIVE   Bilirubin Urine NEGATIVE  NEGATIVE   Ketones, ur 15 (*) NEGATIVE mg/dL   Protein, ur NEGATIVE  NEGATIVE mg/dL   Urobilinogen, UA 0.2  0.0 - 1.0 mg/dL   Nitrite NEGATIVE  NEGATIVE   Leukocytes, UA LARGE (*) NEGATIVE  HEPATIC FUNCTION PANEL      Result Value Ref Range   Total Protein 8.0  6.0 - 8.3 g/dL   Albumin 4.1  3.5 - 5.2 g/dL   AST 25  0 - 37 U/L   ALT 14  0 - 35 U/L   Alkaline Phosphatase 62  39 - 117 U/L   Total Bilirubin 0.2 (*) 0.3 - 1.2 mg/dL   Bilirubin, Direct <0.2  0.0 - 0.3 mg/dL   Indirect Bilirubin NOT CALCULATED  0.3 - 0.9 mg/dL  URINE MICROSCOPIC-ADD ON      Result Value Ref Range   Squamous Epithelial / LPF MANY (*) RARE   WBC, UA 11-20  <3 WBC/hpf   RBC / HPF 0-2  <3 RBC/hpf   Bacteria, UA MANY (*) RARE  POCT I-STAT, CHEM 8      Result Value Ref Range   Sodium 146  137 - 147 mEq/L   Potassium 3.8  3.7 - 5.3 mEq/L   Chloride 105  96 - 112 mEq/L   BUN 16  6 - 23 mg/dL   Creatinine,  Ser 0.90  0.50 - 1.10 mg/dL   Glucose, Bld 105 (*) 70 - 99 mg/dL   Calcium, Ion 1.20  1.13 - 1.30 mmol/L   TCO2 24  0 - 100 mmol/L   Hemoglobin 16.7 (*) 12.0 - 15.0 g/dL   HCT 49.0 (*) 36.0 - 46.0 %  POCT I-STAT TROPONIN I      Result Value Ref Range   Troponin i, poc 0.04  0.00 - 0.08 ng/mL   Comment 3             Imaging Review Dg Chest 2 View  04/05/2013   CLINICAL DATA:  Dizziness since 4 a.m. with emesis twice  EXAM: CHEST  2 VIEW  COMPARISON:  None.  FINDINGS: Heart  size upper normal. Vascular pattern normal. Lungs clear. No effusions.  IMPRESSION: No acute findings   Electronically Signed   By: Skipper Cliche M.D.   On: 04/05/2013 16:03   Ct Head Wo Contrast  04/05/2013   CLINICAL DATA:  Dizziness.  Vomiting.  History of stroke.  EXAM: CT HEAD WITHOUT CONTRAST  TECHNIQUE: Contiguous axial images were obtained from the base of the skull through the vertex without intravenous contrast.  COMPARISON:  None.  FINDINGS: The brain does not show accelerated atrophy for age. There chronic small vessel changes within the hemispheric white matter. There is an old right parietal cortical and subcortical infarction. There are old small vessel infarctions within the basal ganglia on the right and the right thalamus. No sign of acute infarction, mass lesion, hemorrhage, hydrocephalus or extra-axial collection. Sinuses, middle ears and mastoids are clear. No calvarial abnormality. Frontal scalp sebaceous cyst incidentally noted.  IMPRESSION: No identifiable acute abnormality.  Chronic small vessel disease affecting the hemispheric white matter, right thalamus and basal ganglia. Old right parietal cortical and subcortical infarction.   Electronically Signed   By: Nelson Chimes M.D.   On: 04/05/2013 16:15   Mr Brain Wo Contrast  04/05/2013   CLINICAL DATA:  80 year old female who awoke with dizziness. Numbness in the left upper extremity. Headache nausea and vomiting. Initial encounter.  EXAM: MRI HEAD  WITHOUT CONTRAST  TECHNIQUE: Multiplanar, multiecho pulse sequences of the brain and surrounding structures were obtained without intravenous contrast.  COMPARISON:  Head CT without contrast 04/05/2013.  FINDINGS: No restricted diffusion or evidence of acute infarction. Major intracranial vascular flow voids are preserved.  No restricted diffusion to suggest acute infarction. No midline shift, mass effect, evidence of mass lesion, ventriculomegaly, extra-axial collection or acute intracranial hemorrhage. Cervicomedullary junction and pituitary are within normal limits. Negative visualized cervical spine.  Small chronic cortically based infarct in the right parietal lobe with encephalomalacia (series 6, image 15). Smaller chronic cortically based infarct in the posterior right temporal lobe nearby (series 7, image 13). Subtle chronic cortically based infarct with encephalomalacia right occipital pole. Superimposed scattered mild to moderate for age cerebral white matter T2 and FLAIR hyperintensity. Deep gray matter nuclei Within normal limits for age. Brainstem and cerebellum within normal limits for age.  The right internal auditory structures appear grossly normal. The left or not well visualized (series 6, image 5). Mastoids are clear. Visualized orbit soft tissues are within normal limits.  Mild to moderate paranasal sinus mucosal thickening. Visualized scalp soft tissues are within normal limits. Normal bone marrow signal.  IMPRESSION: 1.  No acute intracranial abnormality. 2. Several small chronic infarcts in the posterior right MCA and right PCA territories. Superimposed cerebral white matter chronic small vessel disease. 3. Left internal auditory structures not well visualized with this technique. If dizziness/vertigo persists, consider nonemergent follow-up IAC protocol brain MRI.   Electronically Signed   By: Lars Pinks M.D.   On: 04/05/2013 19:43    EKG Interpretation    Date/Time:  Wednesday April 05 2013 14:13:46 EST Ventricular Rate:  91 PR Interval:  176 QRS Duration: 100 QT Interval:  378 QTC Calculation: 464 R Axis:   55 Text Interpretation:  Sinus rhythm with frequent Premature ventricular complexes Cannot rule out Anterior infarct , age undetermined Abnormal ECG No previous ECGs available Confirmed by   MD,  ([id number]) on 04/05/2013 3:15:06 PM            MDM   Final diagnoses:  Vertigo  Patient with extensive workup for the vertigo. MRI without acute changes. Consultation with neurology is given their blessing for her to be discharged home. Patient improved significantly here with antinausea medicine and fiber. We'll continue both. Followup with your nose and throat to evaluate the ear canals more carefully has been arranged. Patient also followup with her primary care Dr.    Mervin Kung, MD 04/05/13 [phone]

## 2013-04-05 NOTE — ED Notes (Signed)
Pt reports that she woke up at 0400 this morning with dizziness so bad that she couldn't get out of bed along with numbness in her left arm. Around 0500 she was able to get out of bed and do her daily activities. Pt lives alone but does not drive called her neighbor at 3 and came here. Reports currently is dizzy, headache, nauseated. She has vomited x 1. Pt is a x 4 is following commands. No droop noted, neighbor reports her speech sounds different.

## 2013-04-05 NOTE — Consult Note (Signed)
NEURO HOSPITALIST CONSULT NOTE    Reason for Consult: acute vertigo  HPI:                                                                                                                                          Amanda Cochran is an 80 y.o. female, right handed, with a past medical history significant for HTN, hyperlipidemia, thyroid disease, ischemic stroke 2015 (left sided numbness), comes in today due to acute onset vertigo. She recalls having a previous episode of " room spinning but not so severe" 13 years ago. This morning, she woke up with a severe, unpleasant spinning sensation " every time I tried to get up". She said that few hours after onset of such sensation she became very nauseated and started vomiting. She tells me that the spinning sensation will get significantly better if her head and neck remain still and worse every time she attempted to move her body. Walking was a major challenge this morning and she had to hold to the bed or walls. Denies double vision, difficulty swallowing, focal weakness, slurred speech, language or visual disturbances. No tinnitus or other new ear symptoms. No recent fever, infection, head or neck trauma. CT brain revealed no acute lesions but oldold right parietal cortical and subcortical infarction. MRI-DWI brain showed no acute abnormality.The right internal auditory structures appear grossly normal but the left is not well visualized  Feels better, more comfortable after receiving zofran IV and meclizine.   Past Medical History  Diagnosis Date  . Thyroid disease   . Hyperlipidemia   . Hypertension   . Glaucoma   . Arthritis   . Bladder infection     Past Surgical History  Procedure Laterality Date  . Thyroid surgery  1976  . Tonsillectomy and adenoidectomy    . Vaginal prolapse repair      No family history on file.   Social History:  reports that she has never smoked. She does not have any smokeless tobacco  history on file. She reports that she does not drink alcohol or use illicit drugs.  No Known Allergies  MEDICATIONS:  I have reviewed the patient's current medications.   ROS:                                                                                                                                       History obtained from the patient and chart review.  General ROS: negative for - chills, fatigue, fever, night sweats, weight gain or weight loss Psychological ROS: negative for - behavioral disorder, hallucinations, memory difficulties, mood swings or suicidal ideation Ophthalmic ROS: negative for - blurry vision, double vision, eye pain ENT ROS: negative for - epistaxis, nasal discharge, oral lesions, sore throat, or tinnitus Allergy and Immunology ROS: negative for - hives or itchy/watery eyes Hematological and Lymphatic ROS: negative for - bleeding problems, bruising or swollen lymph nodes Endocrine ROS: negative for - galactorrhea, hair pattern changes, polydipsia/polyuria or temperature intolerance Respiratory ROS: negative for - cough, hemoptysis, shortness of breath or wheezing Cardiovascular ROS: negative for - chest pain, dyspnea on exertion, edema or irregular heartbeat Gastrointestinal ROS: negative for - abdominal pain, diarrhea, hematemesis, nausea/vomiting or stool incontinence Genito-Urinary ROS: negative for - dysuria, hematuria, incontinence or urinary frequency/urgency Musculoskeletal ROS: negative for - joint swelling or muscular weakness Neurological ROS: as noted in HPI Dermatological ROS: negative for rash and skin lesion changes   Physical exam: pleasant female in no apparent distress at the time of this evaluation. Blood pressure 163/69, pulse 70, temperature 97.6 F (36.4 C), temperature source Oral, resp. rate 23, weight 69.4 kg (153 lb), SpO2  96.00%. Head: normocephalic. Neck: supple, no bruits, no JVD. Cardiac: no murmurs. Lungs: clear. Abdomen: soft, no tender, no mass. Extremities: no edema.  Neurologic Examination:                                                                                                      Mental Status: Alert, oriented, thought content appropriate.  Speech fluent without evidence of aphasia.  Able to follow 3 step commands without difficulty. Cranial Nerves: II: Discs flat bilaterally; Visual fields grossly normal, pupils equal, round, reactive to light and accommodation III,IV, VI: ptosis not present, extra-ocular motions intact bilaterally V,VII: smile symmetric, facial light touch sensation normal bilaterally VIII: hearing normal bilaterally IX,X: gag reflex present XI: bilateral shoulder shrug XII: midline tongue extension without atrophy or fasciculations  Motor: Right : Upper extremity   5/5    Left:     Upper extremity   5/5  Lower extremity   5/5     Lower extremity  5/5 Tone and bulk:normal tone throughout; no atrophy noted Sensory: Pinprick and light touch intact throughout, bilaterally Deep Tendon Reflexes:  Right: Upper Extremity   Left: Upper extremity   biceps (C-5 to C-6) 2/4   biceps (C-5 to C-6) 2/4 tricep (C7) 2/4    triceps (C7) 2/4 Brachioradialis (C6) 2/4  Brachioradialis (C6) 2/4  Lower Extremity Lower Extremity  quadriceps (L-2 to L-4) 2/4   quadriceps (L-2 to L-4) 2/4 Achilles (S1) 2/4   Achilles (S1) 2/4  Plantars: Right: downgoing   Left: downgoing Cerebellar: normal finger-to-nose,  normal heel-to-shin test Gait:  No tested CV: pulses palpable throughout    No results found for this basename: cbc, bmp, coags, chol, tri, ldl, hga1c    Results for orders placed during the hospital encounter of 04/05/13 (from the past 48 hour(s))  CBC     Status: Abnormal   Collection Time    04/05/13  2:45 PM      Result Value Ref Range   WBC 11.6 (*) 4.0 - 10.5  K/uL   RBC 5.27 (*) 3.87 - 5.11 MIL/uL   Hemoglobin 15.6 (*) 12.0 - 15.0 g/dL   HCT 45.9  36.0 - 46.0 %   MCV 87.1  78.0 - 100.0 fL   MCH 29.6  26.0 - 34.0 pg   MCHC 34.0  30.0 - 36.0 g/dL   RDW 14.1  11.5 - 15.5 %   Platelets 279  150 - 400 K/uL  BASIC METABOLIC PANEL     Status: Abnormal   Collection Time    04/05/13  2:45 PM      Result Value Ref Range   Sodium 145  137 - 147 mEq/L   Potassium 4.1  3.7 - 5.3 mEq/L   Chloride 105  96 - 112 mEq/L   CO2 23  19 - 32 mEq/L   Glucose, Bld 104 (*) 70 - 99 mg/dL   BUN 17  6 - 23 mg/dL   Creatinine, Ser 0.81  0.50 - 1.10 mg/dL   Calcium 9.9  8.4 - 10.5 mg/dL   GFR calc non Af Amer 66 (*) >90 mL/min   GFR calc Af Amer 76 (*) >90 mL/min   Comment: (NOTE)     The eGFR has been calculated using the CKD EPI equation.     This calculation has not been validated in all clinical situations.     eGFR's persistently <90 mL/min signify possible Chronic Kidney     Disease.  HEPATIC FUNCTION PANEL     Status: Abnormal   Collection Time    04/05/13  2:45 PM      Result Value Ref Range   Total Protein 8.0  6.0 - 8.3 g/dL   Albumin 4.1  3.5 - 5.2 g/dL   AST 25  0 - 37 U/L   Comment: HEMOLYSIS AT THIS LEVEL MAY AFFECT RESULT   ALT 14  0 - 35 U/L   Alkaline Phosphatase 62  39 - 117 U/L   Total Bilirubin 0.2 (*) 0.3 - 1.2 mg/dL   Bilirubin, Direct <0.2  0.0 - 0.3 mg/dL   Indirect Bilirubin NOT CALCULATED  0.3 - 0.9 mg/dL  POCT I-STAT TROPONIN I     Status: None   Collection Time    04/05/13  3:02 PM      Result Value Ref Range   Troponin i, poc 0.04  0.00 - 0.08 ng/mL   Comment 3  Comment: Due to the release kinetics of cTnI,     a negative result within the first hours     of the onset of symptoms does not rule out     myocardial infarction with certainty.     If myocardial infarction is still suspected,     repeat the test at appropriate intervals.  POCT I-STAT, CHEM 8     Status: Abnormal   Collection Time    04/05/13  3:04  PM      Result Value Ref Range   Sodium 146  137 - 147 mEq/L   Potassium 3.8  3.7 - 5.3 mEq/L   Chloride 105  96 - 112 mEq/L   BUN 16  6 - 23 mg/dL   Creatinine, Ser 0.90  0.50 - 1.10 mg/dL   Glucose, Bld 105 (*) 70 - 99 mg/dL   Calcium, Ion 1.20  1.13 - 1.30 mmol/L   TCO2 24  0 - 100 mmol/L   Hemoglobin 16.7 (*) 12.0 - 15.0 g/dL   HCT 49.0 (*) 36.0 - 46.0 %  URINALYSIS, ROUTINE W REFLEX MICROSCOPIC     Status: Abnormal   Collection Time    04/05/13  7:33 PM      Result Value Ref Range   Color, Urine YELLOW  YELLOW   APPearance CLOUDY (*) CLEAR   Specific Gravity, Urine 1.019  1.005 - 1.030   pH 8.0  5.0 - 8.0   Glucose, UA NEGATIVE  NEGATIVE mg/dL   Hgb urine dipstick TRACE (*) NEGATIVE   Bilirubin Urine NEGATIVE  NEGATIVE   Ketones, ur 15 (*) NEGATIVE mg/dL   Protein, ur NEGATIVE  NEGATIVE mg/dL   Urobilinogen, UA 0.2  0.0 - 1.0 mg/dL   Nitrite NEGATIVE  NEGATIVE   Leukocytes, UA LARGE (*) NEGATIVE  URINE MICROSCOPIC-ADD ON     Status: Abnormal   Collection Time    04/05/13  7:33 PM      Result Value Ref Range   Squamous Epithelial / LPF MANY (*) RARE   WBC, UA 11-20  <3 WBC/hpf   RBC / HPF 0-2  <3 RBC/hpf   Bacteria, UA MANY (*) RARE    Dg Chest 2 View  04/05/2013   CLINICAL DATA:  Dizziness since 4 a.m. with emesis twice  EXAM: CHEST  2 VIEW  COMPARISON:  None.  FINDINGS: Heart size upper normal. Vascular pattern normal. Lungs clear. No effusions.  IMPRESSION: No acute findings   Electronically Signed   By: Skipper Cliche M.D.   On: 04/05/2013 16:03   Ct Head Wo Contrast  04/05/2013   CLINICAL DATA:  Dizziness.  Vomiting.  History of stroke.  EXAM: CT HEAD WITHOUT CONTRAST  TECHNIQUE: Contiguous axial images were obtained from the base of the skull through the vertex without intravenous contrast.  COMPARISON:  None.  FINDINGS: The brain does not show accelerated atrophy for age. There chronic small vessel changes within the hemispheric white matter. There is an old  right parietal cortical and subcortical infarction. There are old small vessel infarctions within the basal ganglia on the right and the right thalamus. No sign of acute infarction, mass lesion, hemorrhage, hydrocephalus or extra-axial collection. Sinuses, middle ears and mastoids are clear. No calvarial abnormality. Frontal scalp sebaceous cyst incidentally noted.  IMPRESSION: No identifiable acute abnormality.  Chronic small vessel disease affecting the hemispheric white matter, right thalamus and basal ganglia. Old right parietal cortical and subcortical infarction.   Electronically Signed   By: Elta Guadeloupe  Shogry M.D.   On: 04/05/2013 16:15   Mr Brain Wo Contrast  04/05/2013   CLINICAL DATA:  80 year old female who awoke with dizziness. Numbness in the left upper extremity. Headache nausea and vomiting. Initial encounter.  EXAM: MRI HEAD WITHOUT CONTRAST  TECHNIQUE: Multiplanar, multiecho pulse sequences of the brain and surrounding structures were obtained without intravenous contrast.  COMPARISON:  Head CT without contrast 04/05/2013.  FINDINGS: No restricted diffusion or evidence of acute infarction. Major intracranial vascular flow voids are preserved.  No restricted diffusion to suggest acute infarction. No midline shift, mass effect, evidence of mass lesion, ventriculomegaly, extra-axial collection or acute intracranial hemorrhage. Cervicomedullary junction and pituitary are within normal limits. Negative visualized cervical spine.  Small chronic cortically based infarct in the right parietal lobe with encephalomalacia (series 6, image 15). Smaller chronic cortically based infarct in the posterior right temporal lobe nearby (series 7, image 13). Subtle chronic cortically based infarct with encephalomalacia right occipital pole. Superimposed scattered mild to moderate for age cerebral white matter T2 and FLAIR hyperintensity. Deep gray matter nuclei Within normal limits for age. Brainstem and cerebellum within  normal limits for age.  The right internal auditory structures appear grossly normal. The left or not well visualized (series 6, image 5). Mastoids are clear. Visualized orbit soft tissues are within normal limits.  Mild to moderate paranasal sinus mucosal thickening. Visualized scalp soft tissues are within normal limits. Normal bone marrow signal.  IMPRESSION: 1.  No acute intracranial abnormality. 2. Several small chronic infarcts in the posterior right MCA and right PCA territories. Superimposed cerebral white matter chronic small vessel disease. 3. Left internal auditory structures not well visualized with this technique. If dizziness/vertigo persists, consider nonemergent follow-up IAC protocol brain MRI.   Electronically Signed   By: Lars Pinks M.D.   On: 04/05/2013 19:43        Assessment/Plan: 80 y/o with acute isolated vertigo worsened by head movements, non focal neuro-exam and MRI brain without evidence of posterior circulation or posterior fossa structures involvement. Poor visualization of the left IAC. Although she has risk factors for stroke, her current presentation seems to be consistent with a peripheral vertigo. In addition, MRI brain revealed no brainstem or cerebellar infarct. She is more comfortable now after receiving Zofran and meclizine and could be discharge tonight with outpatient ENT follow up to further address poorly visualized left IAC and start vestibular rehab with Eply exercises.     Dorian Pod, MD 04/05/2013, 10:21 PM [facility].

## 2013-04-05 NOTE — ED Notes (Signed)
Patient transported to MRI 

## 2013-04-05 NOTE — ED Notes (Signed)
Neurologist at the bedside 

## 2013-04-05 NOTE — ED Notes (Signed)
Pt given turkey sandwich and ice water ?

## 2013-04-06 LAB — URINE CULTURE

## 2013-04-07 DIAGNOSIS — H8110 Benign paroxysmal vertigo, unspecified ear: Secondary | ICD-10-CM | POA: Diagnosis not present

## 2013-04-07 DIAGNOSIS — Z6825 Body mass index (BMI) 25.0-25.9, adult: Secondary | ICD-10-CM | POA: Diagnosis not present

## 2013-04-27 DIAGNOSIS — H25019 Cortical age-related cataract, unspecified eye: Secondary | ICD-10-CM | POA: Diagnosis not present

## 2013-04-27 DIAGNOSIS — H2510 Age-related nuclear cataract, unspecified eye: Secondary | ICD-10-CM | POA: Diagnosis not present

## 2013-04-27 DIAGNOSIS — H353190 Nonexudative age-related macular degeneration, unspecified eye, stage unspecified: Secondary | ICD-10-CM | POA: Diagnosis not present

## 2013-06-27 DIAGNOSIS — H2589 Other age-related cataract: Secondary | ICD-10-CM | POA: Diagnosis not present

## 2013-06-27 DIAGNOSIS — H2510 Age-related nuclear cataract, unspecified eye: Secondary | ICD-10-CM | POA: Diagnosis not present

## 2013-06-27 DIAGNOSIS — H25019 Cortical age-related cataract, unspecified eye: Secondary | ICD-10-CM | POA: Diagnosis not present

## 2013-07-18 DIAGNOSIS — H2589 Other age-related cataract: Secondary | ICD-10-CM | POA: Diagnosis not present

## 2013-07-18 DIAGNOSIS — H25019 Cortical age-related cataract, unspecified eye: Secondary | ICD-10-CM | POA: Diagnosis not present

## 2013-07-18 DIAGNOSIS — H2510 Age-related nuclear cataract, unspecified eye: Secondary | ICD-10-CM | POA: Diagnosis not present

## 2013-08-23 DIAGNOSIS — H409 Unspecified glaucoma: Secondary | ICD-10-CM | POA: Diagnosis not present

## 2013-08-23 DIAGNOSIS — H4011X0 Primary open-angle glaucoma, stage unspecified: Secondary | ICD-10-CM | POA: Diagnosis not present

## 2013-09-20 DIAGNOSIS — H409 Unspecified glaucoma: Secondary | ICD-10-CM | POA: Diagnosis not present

## 2013-09-20 DIAGNOSIS — H4011X0 Primary open-angle glaucoma, stage unspecified: Secondary | ICD-10-CM | POA: Diagnosis not present

## 2013-10-02 DIAGNOSIS — I10 Essential (primary) hypertension: Secondary | ICD-10-CM | POA: Diagnosis not present

## 2013-10-02 DIAGNOSIS — E039 Hypothyroidism, unspecified: Secondary | ICD-10-CM | POA: Diagnosis not present

## 2013-10-02 DIAGNOSIS — R7301 Impaired fasting glucose: Secondary | ICD-10-CM | POA: Diagnosis not present

## 2013-10-02 DIAGNOSIS — F329 Major depressive disorder, single episode, unspecified: Secondary | ICD-10-CM | POA: Diagnosis not present

## 2013-10-02 DIAGNOSIS — Z8679 Personal history of other diseases of the circulatory system: Secondary | ICD-10-CM | POA: Diagnosis not present

## 2013-10-02 DIAGNOSIS — Z6825 Body mass index (BMI) 25.0-25.9, adult: Secondary | ICD-10-CM | POA: Diagnosis not present

## 2013-10-02 DIAGNOSIS — F3289 Other specified depressive episodes: Secondary | ICD-10-CM | POA: Diagnosis not present

## 2013-10-03 DIAGNOSIS — F329 Major depressive disorder, single episode, unspecified: Secondary | ICD-10-CM | POA: Diagnosis not present

## 2013-10-03 DIAGNOSIS — H548 Legal blindness, as defined in USA: Secondary | ICD-10-CM | POA: Diagnosis not present

## 2013-10-03 DIAGNOSIS — I10 Essential (primary) hypertension: Secondary | ICD-10-CM | POA: Diagnosis not present

## 2013-10-03 DIAGNOSIS — R279 Unspecified lack of coordination: Secondary | ICD-10-CM | POA: Diagnosis not present

## 2013-10-03 DIAGNOSIS — F3289 Other specified depressive episodes: Secondary | ICD-10-CM | POA: Diagnosis not present

## 2013-10-05 DIAGNOSIS — R279 Unspecified lack of coordination: Secondary | ICD-10-CM | POA: Diagnosis not present

## 2013-10-05 DIAGNOSIS — F3289 Other specified depressive episodes: Secondary | ICD-10-CM | POA: Diagnosis not present

## 2013-10-05 DIAGNOSIS — F329 Major depressive disorder, single episode, unspecified: Secondary | ICD-10-CM | POA: Diagnosis not present

## 2013-10-06 DIAGNOSIS — H409 Unspecified glaucoma: Secondary | ICD-10-CM | POA: Diagnosis not present

## 2013-10-06 DIAGNOSIS — H4011X0 Primary open-angle glaucoma, stage unspecified: Secondary | ICD-10-CM | POA: Diagnosis not present

## 2013-10-09 DIAGNOSIS — R279 Unspecified lack of coordination: Secondary | ICD-10-CM | POA: Diagnosis not present

## 2013-10-09 DIAGNOSIS — F3289 Other specified depressive episodes: Secondary | ICD-10-CM | POA: Diagnosis not present

## 2013-10-09 DIAGNOSIS — F329 Major depressive disorder, single episode, unspecified: Secondary | ICD-10-CM | POA: Diagnosis not present

## 2013-10-10 DIAGNOSIS — R279 Unspecified lack of coordination: Secondary | ICD-10-CM | POA: Diagnosis not present

## 2013-10-10 DIAGNOSIS — F3289 Other specified depressive episodes: Secondary | ICD-10-CM | POA: Diagnosis not present

## 2013-10-10 DIAGNOSIS — F329 Major depressive disorder, single episode, unspecified: Secondary | ICD-10-CM | POA: Diagnosis not present

## 2013-10-11 DIAGNOSIS — F3289 Other specified depressive episodes: Secondary | ICD-10-CM | POA: Diagnosis not present

## 2013-10-11 DIAGNOSIS — F329 Major depressive disorder, single episode, unspecified: Secondary | ICD-10-CM | POA: Diagnosis not present

## 2013-10-11 DIAGNOSIS — R279 Unspecified lack of coordination: Secondary | ICD-10-CM | POA: Diagnosis not present

## 2013-10-27 DIAGNOSIS — L259 Unspecified contact dermatitis, unspecified cause: Secondary | ICD-10-CM | POA: Diagnosis not present

## 2013-11-22 DIAGNOSIS — H409 Unspecified glaucoma: Secondary | ICD-10-CM | POA: Diagnosis not present

## 2013-11-22 DIAGNOSIS — H4011X0 Primary open-angle glaucoma, stage unspecified: Secondary | ICD-10-CM | POA: Diagnosis not present

## 2013-11-27 DIAGNOSIS — D225 Melanocytic nevi of trunk: Secondary | ICD-10-CM | POA: Diagnosis not present

## 2013-11-27 DIAGNOSIS — L7211 Pilar cyst: Secondary | ICD-10-CM | POA: Diagnosis not present

## 2013-11-27 DIAGNOSIS — D485 Neoplasm of uncertain behavior of skin: Secondary | ICD-10-CM | POA: Diagnosis not present

## 2013-11-27 DIAGNOSIS — L570 Actinic keratosis: Secondary | ICD-10-CM | POA: Diagnosis not present

## 2013-12-05 DIAGNOSIS — H4011X3 Primary open-angle glaucoma, severe stage: Secondary | ICD-10-CM | POA: Diagnosis not present

## 2013-12-20 DIAGNOSIS — Z283 Underimmunization status: Secondary | ICD-10-CM | POA: Diagnosis not present

## 2013-12-20 DIAGNOSIS — Z23 Encounter for immunization: Secondary | ICD-10-CM | POA: Diagnosis not present

## 2013-12-20 DIAGNOSIS — H4011X3 Primary open-angle glaucoma, severe stage: Secondary | ICD-10-CM | POA: Diagnosis not present

## 2014-01-25 DIAGNOSIS — H4011X3 Primary open-angle glaucoma, severe stage: Secondary | ICD-10-CM | POA: Diagnosis not present

## 2014-02-03 ENCOUNTER — Emergency Department (HOSPITAL_COMMUNITY): Payer: Medicare Other

## 2014-02-03 ENCOUNTER — Encounter (HOSPITAL_COMMUNITY): Payer: Self-pay | Admitting: Emergency Medicine

## 2014-02-03 ENCOUNTER — Inpatient Hospital Stay (HOSPITAL_COMMUNITY)
Admission: EM | Admit: 2014-02-03 | Discharge: 2014-02-07 | DRG: 066 | Disposition: A | Discharge status: Rehabilitation Facility | Payer: Medicare Other | Attending: Internal Medicine | Admitting: Internal Medicine

## 2014-02-03 ENCOUNTER — Inpatient Hospital Stay (HOSPITAL_COMMUNITY): Payer: Medicare Other

## 2014-02-03 DIAGNOSIS — I639 Cerebral infarction, unspecified: Secondary | ICD-10-CM | POA: Diagnosis not present

## 2014-02-03 DIAGNOSIS — I10 Essential (primary) hypertension: Secondary | ICD-10-CM | POA: Diagnosis present

## 2014-02-03 DIAGNOSIS — E039 Hypothyroidism, unspecified: Secondary | ICD-10-CM | POA: Diagnosis present

## 2014-02-03 DIAGNOSIS — H409 Unspecified glaucoma: Secondary | ICD-10-CM | POA: Diagnosis present

## 2014-02-03 DIAGNOSIS — H548 Legal blindness, as defined in USA: Secondary | ICD-10-CM | POA: Diagnosis present

## 2014-02-03 DIAGNOSIS — E785 Hyperlipidemia, unspecified: Secondary | ICD-10-CM | POA: Diagnosis present

## 2014-02-03 DIAGNOSIS — R29810 Facial weakness: Secondary | ICD-10-CM | POA: Diagnosis present

## 2014-02-03 DIAGNOSIS — R471 Dysarthria and anarthria: Secondary | ICD-10-CM | POA: Diagnosis present

## 2014-02-03 DIAGNOSIS — I517 Cardiomegaly: Secondary | ICD-10-CM

## 2014-02-03 DIAGNOSIS — I6789 Other cerebrovascular disease: Secondary | ICD-10-CM | POA: Diagnosis not present

## 2014-02-03 DIAGNOSIS — R1312 Dysphagia, oropharyngeal phase: Secondary | ICD-10-CM | POA: Diagnosis present

## 2014-02-03 DIAGNOSIS — Z8673 Personal history of transient ischemic attack (TIA), and cerebral infarction without residual deficits: Secondary | ICD-10-CM | POA: Diagnosis not present

## 2014-02-03 DIAGNOSIS — M1990 Unspecified osteoarthritis, unspecified site: Secondary | ICD-10-CM | POA: Diagnosis present

## 2014-02-03 DIAGNOSIS — Z7982 Long term (current) use of aspirin: Secondary | ICD-10-CM | POA: Diagnosis not present

## 2014-02-03 DIAGNOSIS — I6350 Cerebral infarction due to unspecified occlusion or stenosis of unspecified cerebral artery: Secondary | ICD-10-CM | POA: Diagnosis not present

## 2014-02-03 LAB — CBG MONITORING, ED: Glucose-Capillary: 120 mg/dL — ABNORMAL HIGH (ref 70–99)

## 2014-02-03 LAB — DIFFERENTIAL
Basophils Absolute: 0.1 10*3/uL (ref 0.0–0.1)
Basophils Relative: 1 % (ref 0–1)
Eosinophils Absolute: 0.3 10*3/uL (ref 0.0–0.7)
Eosinophils Relative: 3 % (ref 0–5)
Lymphocytes Relative: 36 % (ref 12–46)
Lymphs Abs: 3.3 10*3/uL (ref 0.7–4.0)
Monocytes Absolute: 0.7 10*3/uL (ref 0.1–1.0)
Monocytes Relative: 8 % (ref 3–12)
Neutro Abs: 4.7 10*3/uL (ref 1.7–7.7)
Neutrophils Relative %: 52 % (ref 43–77)

## 2014-02-03 LAB — COMPREHENSIVE METABOLIC PANEL WITH GFR
ALT: 13 U/L (ref 0–35)
AST: 25 U/L (ref 0–37)
Albumin: 3.4 g/dL — ABNORMAL LOW (ref 3.5–5.2)
Alkaline Phosphatase: 57 U/L (ref 39–117)
Anion gap: 14 (ref 5–15)
BUN: 19 mg/dL (ref 6–23)
CO2: 23 meq/L (ref 19–32)
Calcium: 9.2 mg/dL (ref 8.4–10.5)
Chloride: 105 meq/L (ref 96–112)
Creatinine, Ser: 0.86 mg/dL (ref 0.50–1.10)
GFR calc Af Amer: 70 mL/min — ABNORMAL LOW
GFR calc non Af Amer: 61 mL/min — ABNORMAL LOW
Glucose, Bld: 119 mg/dL — ABNORMAL HIGH (ref 70–99)
Potassium: 4.4 meq/L (ref 3.7–5.3)
Sodium: 142 meq/L (ref 137–147)
Total Bilirubin: 0.3 mg/dL (ref 0.3–1.2)
Total Protein: 6.9 g/dL (ref 6.0–8.3)

## 2014-02-03 LAB — I-STAT CHEM 8, ED
BUN: 20 mg/dL (ref 6–23)
CALCIUM ION: 1.1 mmol/L — AB (ref 1.13–1.30)
Chloride: 106 mEq/L (ref 96–112)
Creatinine, Ser: 0.9 mg/dL (ref 0.50–1.10)
Glucose, Bld: 120 mg/dL — ABNORMAL HIGH (ref 70–99)
HCT: 43 % (ref 36.0–46.0)
HEMOGLOBIN: 14.6 g/dL (ref 12.0–15.0)
Potassium: 3.8 mEq/L (ref 3.7–5.3)
Sodium: 140 mEq/L (ref 137–147)
TCO2: 22 mmol/L (ref 0–100)

## 2014-02-03 LAB — APTT: aPTT: 27 s (ref 24–37)

## 2014-02-03 LAB — CBC
HCT: 41.5 % (ref 36.0–46.0)
Hemoglobin: 13.6 g/dL (ref 12.0–15.0)
MCH: 28.7 pg (ref 26.0–34.0)
MCHC: 32.8 g/dL (ref 30.0–36.0)
MCV: 87.6 fL (ref 78.0–100.0)
PLATELETS: 218 10*3/uL (ref 150–400)
RBC: 4.74 MIL/uL (ref 3.87–5.11)
RDW: 14.1 % (ref 11.5–15.5)
WBC: 9.1 10*3/uL (ref 4.0–10.5)

## 2014-02-03 LAB — I-STAT TROPONIN, ED: Troponin i, poc: 0.03 ng/mL (ref 0.00–0.08)

## 2014-02-03 LAB — PROTIME-INR
INR: 1.03 (ref 0.00–1.49)
PROTHROMBIN TIME: 13.6 s (ref 11.6–15.2)

## 2014-02-03 MED ORDER — CLOPIDOGREL BISULFATE 75 MG PO TABS
75.0000 mg | ORAL_TABLET | Freq: Every day | ORAL | Status: DC
  Filled 2014-02-03: qty 1

## 2014-02-03 MED ORDER — LORAZEPAM 2 MG/ML IJ SOLN
INTRAMUSCULAR | Status: AC
Start: 2014-02-03 — End: 2014-02-03
  Filled 2014-02-03: qty 1

## 2014-02-03 MED ORDER — BRINZOLAMIDE 1 % OP SUSP
1.0000 [drp] | Freq: Two times a day (BID) | OPHTHALMIC | Status: DC
  Filled 2014-02-03: qty 10

## 2014-02-03 MED ORDER — RESOURCE THICKENUP CLEAR PO POWD
ORAL | Status: DC | PRN
  Filled 2014-02-03: qty 125

## 2014-02-03 MED ORDER — STROKE: EARLY STAGES OF RECOVERY BOOK
Freq: Once | Status: DC
  Filled 2014-02-03: qty 1

## 2014-02-03 MED ORDER — ESCITALOPRAM OXALATE 10 MG PO TABS
10.0000 mg | ORAL_TABLET | Freq: Every day | ORAL | Status: DC
  Administered 2014-02-04 – 2014-02-06 (×3): 10 mg via ORAL
  Filled 2014-02-03 (×4): qty 1

## 2014-02-03 MED ORDER — SIMVASTATIN 40 MG PO TABS
40.0000 mg | ORAL_TABLET | Freq: Every day | ORAL | Status: DC
  Administered 2014-02-03 – 2014-02-06 (×4): 40 mg via ORAL
  Filled 2014-02-03 (×4): qty 1

## 2014-02-03 MED ORDER — HYDRALAZINE HCL 20 MG/ML IJ SOLN
5.0000 mg | Freq: Four times a day (QID) | INTRAMUSCULAR | Status: DC | PRN
  Administered 2014-02-03 – 2014-02-04 (×2): 5 mg via INTRAVENOUS
  Filled 2014-02-03 (×2): qty 1

## 2014-02-03 MED ORDER — LORAZEPAM 2 MG/ML IJ SOLN
0.5000 mg | Freq: Once | INTRAMUSCULAR | Status: AC
  Administered 2014-02-03: 0.5 mg via INTRAVENOUS

## 2014-02-03 MED ORDER — LABETALOL HCL 5 MG/ML IV SOLN
10.0000 mg | Freq: Once | INTRAVENOUS | Status: AC
  Administered 2014-02-03: 10 mg via INTRAVENOUS
  Filled 2014-02-03: qty 4

## 2014-02-03 MED ORDER — SODIUM CHLORIDE 0.9 % IV SOLN: INTRAVENOUS | Status: DC

## 2014-02-03 MED ORDER — LATANOPROST 0.005 % OP SOLN
1.0000 [drp] | Freq: Every day | OPHTHALMIC | Status: DC
  Administered 2014-02-03 – 2014-02-06 (×4): 1 [drp] via OPHTHALMIC
  Filled 2014-02-03 (×2): qty 2.5

## 2014-02-03 MED ORDER — HEPARIN SODIUM (PORCINE) 5000 UNIT/ML IJ SOLN
5000.0000 [IU] | Freq: Three times a day (TID) | INTRAMUSCULAR | Status: DC
  Administered 2014-02-03 – 2014-02-07 (×13): 5000 [IU] via SUBCUTANEOUS
  Filled 2014-02-03 (×13): qty 1

## 2014-02-03 MED ORDER — ASPIRIN EC 325 MG PO TBEC
325.0000 mg | DELAYED_RELEASE_TABLET | Freq: Every day | ORAL | Status: DC
  Administered 2014-02-03 – 2014-02-04 (×2): 325 mg via ORAL
  Filled 2014-02-03 (×2): qty 1

## 2014-02-03 MED ORDER — DORZOLAMIDE HCL-TIMOLOL MAL 2-0.5 % OP SOLN
1.0000 [drp] | Freq: Two times a day (BID) | OPHTHALMIC | Status: DC
  Administered 2014-02-03 – 2014-02-07 (×9): 1 [drp] via OPHTHALMIC
  Filled 2014-02-03: qty 10

## 2014-02-03 MED ORDER — SENNOSIDES-DOCUSATE SODIUM 8.6-50 MG PO TABS: 1.0000 | ORAL_TABLET | Freq: Every evening | ORAL | Status: DC | PRN

## 2014-02-03 NOTE — ED Provider Notes (Signed)
CSN: [id number]     Arrival date & time 02/03/14  0355 History   First MD Initiated Contact with Patient 02/03/14 0359     Chief Complaint  Patient presents with  . Code Stroke    @EDPCLEARED @ (Consider location/radiation/quality/duration/timing/severity/associated sxs/prior Treatment) HPI Patient presents as code stroke. She has right-sided facial droop and slurred speech. Unknown onset. Last seen normal at roughly 10 PM. Per EMS systolic blood pressure reading as high as 270. Given 10 mg of labetalol in route. Patient denies any pain specifically any chest pain, abdominal pain, headache. No point did she have any nausea or vomiting. She's had no visual changes. Past Medical History  Diagnosis Date  . Thyroid disease   . Hyperlipidemia   . Hypertension   . Glaucoma   . Arthritis   . Bladder infection    Past Surgical History  Procedure Laterality Date  . Thyroid surgery  1976  . Tonsillectomy and adenoidectomy    . Vaginal prolapse repair     History reviewed. No pertinent family history. History  Substance Use Topics  . Smoking status: Never Smoker   . Smokeless tobacco: Not on file  . Alcohol Use: No   OB History    No data available     Review of Systems  Constitutional: Negative for fever and chills.  Eyes: Negative for visual disturbance.  Respiratory: Negative for cough and shortness of breath.   Cardiovascular: Negative for chest pain.  Gastrointestinal: Negative for nausea, vomiting, abdominal pain and diarrhea.  Musculoskeletal: Negative for myalgias, neck pain and neck stiffness.  Skin: Negative for rash and wound.  Neurological: Positive for speech difficulty. Negative for dizziness, light-headedness, numbness and headaches.  All other systems reviewed and are negative.     Allergies  Review of patient's allergies indicates no known allergies.  Home Medications   Prior to Admission medications   Medication Sig Start Date End Date Taking?  Authorizing Provider  aspirin 325 MG tablet Take 325 mg by mouth at bedtime.   Yes Historical Provider, MD  benazepril (LOTENSIN) 40 MG tablet Take 40 mg by mouth daily.   Yes Historical Provider, MD  Calcium Carbonate (CALTRATE 600 PO) Take 1 tablet by mouth daily.    Yes Historical Provider, MD  Cholecalciferol (VITAMIN D) 2000 UNITS CAPS Take 1 capsule by mouth daily.   Yes Historical Provider, MD  dorzolamide-timolol (COSOPT) 22.3-6.8 MG/ML ophthalmic solution Place 1 drop into both eyes 2 (two) times daily.   Yes Historical Provider, MD  Fish Oil-Cholecalciferol (FISH OIL + D3) 1200-1000 MG-UNIT CAPS Take 1 tablet by mouth daily.    Yes Historical Provider, MD  latanoprost (XALATAN) 0.005 % ophthalmic solution Place 1 drop into both eyes at bedtime.    Yes Historical Provider, MD  levothyroxine (SYNTHROID, LEVOTHROID) 100 MCG tablet Take 100 mcg by mouth daily.   Yes Historical Provider, MD  meclizine (ANTIVERT) 25 MG tablet Take 1 tablet (25 mg total) by mouth 4 (four) times daily. Patient taking differently: Take 25 mg by mouth 3 (three) times daily as needed for dizziness.  04/05/13  Yes Fredia Sorrow, MD  Multiple Vitamins-Minerals (CENTRUM SILVER PO) Take 1 tablet by mouth daily.   Yes Historical Provider, MD  ondansetron (ZOFRAN ODT) 4 MG disintegrating tablet Take 1 tablet (4 mg total) by mouth every 8 (eight) hours as needed for nausea or vomiting. 04/05/13  Yes Fredia Sorrow, MD  simvastatin (ZOCOR) 40 MG tablet Take 40 mg by mouth every evening.  Yes Historical Provider, MD  brinzolamide (AZOPT) 1 % ophthalmic suspension Place 1 drop into both eyes 2 (two) times daily.     Historical Provider, MD  escitalopram (LEXAPRO) 10 MG tablet Take 10 mg by mouth at bedtime. 03/28/13   Historical Provider, MD   BP 198/78 mmHg  Pulse 78  Temp(Src) 97.7 F (36.5 C)  Resp 13  SpO2 98% Physical Exam  Constitutional: She is oriented to person, place, and time. She appears well-developed and  well-nourished. No distress.  HENT:  Head: Normocephalic and atraumatic.  Mouth/Throat: Oropharynx is clear and moist. No oropharyngeal exudate.  Eyes: EOM are normal. Pupils are equal, round, and reactive to light.  Neck: Normal range of motion. Neck supple.  Cardiovascular: Normal rate and regular rhythm.   Pulmonary/Chest: Effort normal and breath sounds normal. No respiratory distress. She has no wheezes. She has no rales.  Abdominal: Soft. Bowel sounds are normal. She exhibits no distension and no mass. There is no tenderness. There is no rebound and no guarding.  Musculoskeletal: Normal range of motion. She exhibits no edema or tenderness.  Neurological: She is alert and oriented to person, place, and time.  Right lower facial droop. Dysarthric speech. 5/5 motor in all extremities. Sensation intact.  Skin: Skin is warm and dry. No rash noted. No erythema.  Psychiatric: She has a normal mood and affect. Her behavior is normal.  Nursing note and vitals reviewed.   ED Course  Procedures (including critical care time) Labs Review Labs Reviewed  COMPREHENSIVE METABOLIC PANEL - Abnormal; Notable for the following:    Glucose, Bld 119 (*)    Albumin 3.4 (*)    GFR calc non Af Amer 61 (*)    GFR calc Af Amer 70 (*)    All other components within normal limits  CBG MONITORING, ED - Abnormal; Notable for the following:    Glucose-Capillary 120 (*)    All other components within normal limits  I-STAT CHEM 8, ED - Abnormal; Notable for the following:    Glucose, Bld 120 (*)    Calcium, Ion 1.10 (*)    All other components within normal limits  PROTIME-INR  APTT  CBC  DIFFERENTIAL  I-STAT TROPOININ, ED    Imaging Review Ct Head (brain) Wo Contrast  02/03/2014   CLINICAL DATA:  Code stroke. Slurred speech and right-sided facial droop.  EXAM: CT HEAD WITHOUT CONTRAST  TECHNIQUE: Contiguous axial images were obtained from the base of the skull through the vertex without intravenous  contrast.  COMPARISON:  MRI brain 04/05/2013.  CT head 04/05/2013.  FINDINGS: Ventricles and sulci appear symmetrical. Mild diffuse atrophy. Low-attenuation changes in the deep white matter consistent with small vessel ischemia. Old lacunar infarcts in the right basal ganglia and thalamus. No mass effect or midline shift. No abnormal extra-axial fluid collections. Gray-white matter junctions are distinct. Basal cisterns are not effaced. No evidence of acute intracranial hemorrhage. No depressed skull fractures. Mucosal thickening in the paranasal sinuses. Mastoid air cells are not opacified. Radiopaque densities demonstrated in what appears to be the oral cavity on the lowest slice. This may represent opaque medication in the patient's mouth.  IMPRESSION: No acute intracranial abnormalities. Chronic atrophy and small vessel ischemic changes.  These results were called by telephone at the time of interpretation on 02/03/2014 at 4:11 am to Dr. Leonel Ramsay, who verbally acknowledged these results.   Electronically Signed   By: Lucienne Capers M.D.   On: 02/03/2014 04:14  EKG Interpretation None      Date: 02/03/2014  Rate: 82  Rhythm: normal sinus rhythm  QRS Axis: normal  Intervals: normal  ST/T Wave abnormalities: nonspecific T wave changes  Conduction Disutrbances:none  Narrative Interpretation:   Old EKG Reviewed: unchanged Frequent PVCs.  MDM   Final diagnoses:  CVA (cerebral vascular accident)    Dr. Leonel Ramsay has evaluated the patient. She is not a candidate for TPA. We'll admit to medicine.  Discussed with Dr. Alcario Drought. Will admit to telemetry bed.  Julianne Rice, MD 02/03/14 [phone]

## 2014-02-03 NOTE — Progress Notes (Signed)
VASCULAR LAB PRELIMINARY  PRELIMINARY  PRELIMINARY  PRELIMINARY  Carotid Dopplers completed.    Preliminary report:  1-39% ICA stenosis.  Vertebral artery flow is antegrade.   , , RVT 02/03/2014, 12:42 PM

## 2014-02-03 NOTE — Progress Notes (Signed)
Code Stroke called on 80 y.o female. Per patient she went to bed around 2200 last night and woke up this morning around 0300 with slurred speech and facial drooping. Pertinent history includes ischemic stroke (2015 per Epic notes), HTN, hyperlipidemia. NIHSS completed yielding 4 for right facial droop, mild sensory impairment to right cheek, and mild dysarthria. Pt hypertensive in field with systolic at 793 per EMS. CBG 120. Due to pt being outside of window and mild symptoms she is not a candidate for TPA. Pt to be admitted for full stroke workup

## 2014-02-03 NOTE — Procedures (Signed)
Objective Swallowing Evaluation: Modified Barium Swallowing Study  Patient Details  Name: Amanda Cochran MRN: [id number] Date of Birth: October 19, 1928  Today's Date: 02/03/2014 Time: 1133-1200 SLP Time Calculation (min) (ACUTE ONLY): 27 min  Past Medical History:  Past Medical History  Diagnosis Date  . Thyroid disease   . Hyperlipidemia   . Hypertension   . Glaucoma   . Arthritis   . Bladder infection    Past Surgical History:  Past Surgical History  Procedure Laterality Date  . Thyroid surgery  1976  . Tonsillectomy and adenoidectomy    . Vaginal prolapse repair     HPI:  80 y.o. female essential hypertension, and previous strokes currently on aspirin that comes in for sudden onset of slurred speech. She laid she woke up like at 2 AM and when she looked in mirror she had a facial droop. She called her neighbor and her neighbor cannot understand her. She denies any chest pain, shortness of breath no change in her medications.  CT head negative, pt with old basal ganglia and thalamic cva.  Swallow evaluation ordered.      Assessment / Plan / Recommendation Clinical Impression  Dysphagia Diagnosis: Mild oral phase dysphagia;Mild pharyngeal phase dysphagia Clinical impression: Mild sensorimotor oropharyngeal dysphagia with delayed oral transiting, decreased laryngeal elevation/closure and impaired epiglottic deflection.  Laryngeal penetration noted with liquids - *nectar,thin* to vocal cords.  Use of chin tuck with nectar decreased penetration to minimal and cued cough/throat clear effective to remove penetrates.  Mild vallecular residuals noted without pt full sensation.  Chin tuck posture also decreases amount of pharyngeal residuals as well as cued effortful dry swallow.  Pt expectoration removed approximately 10% of vallecular residuals that pt did not clear into esophagus, head turn right not effective with or without chin tuck.  Recommend modified diet to maximize airway protection.   Skilled intervention included educating pt to findings and reinforcing effective compensation strategies.      Treatment Recommendation  Therapy as outlined in treatment plan below    Diet Recommendation Dysphagia 3 (Mechanical Soft);Nectar-thick liquid (tsps thin ok with chin tuck)   Liquid Administration via: Cup;Straw;Spoon Medication Administration: Crushed with puree Supervision: Patient able to self feed;Full supervision/cueing for compensatory strategies Compensations: Slow rate;Small sips/bites;Clear throat intermittently;Effortful swallow;Multiple dry swallows after each bite/sip Postural Changes and/or Swallow Maneuvers: Seated upright 90 degrees;Upright 30-60 min after meal;Chin tuck (chin tuck with ALL boluses)    Other  Recommendations Recommended Consults: MBS Oral Care Recommendations: Oral care BID Other Recommendations: Have oral suction available   Follow Up Recommendations   (TBD)    Frequency and Duration min 2x/week  2 weeks     General Date of Onset: 02/03/14 HPI: 80 y.o. female essential hypertension, and previous strokes currently on aspirin that comes in for sudden onset of slurred speech. She laid she woke up like at 2 AM and when she looked in mirror she had a facial droop. She called her neighbor and her neighbor cannot understand her. She denies any chest pain, shortness of breath no change in her medications.  CT head negative, pt with old basal ganglia and thalamic cva.  Swallow evaluation ordered.  Type of Study: Modified Barium Swallowing Study Reason for Referral: Objectively evaluate swallowing function (concern for pharyngeal dysphagia s/p cva) Diet Prior to this Study: NPO Temperature Spikes Noted: No Respiratory Status: Room air History of Recent Intubation: No Behavior/Cognition: Alert;Cooperative;Pleasant mood Oral Cavity - Dentition: Adequate natural dentition Oral Motor / Sensory Function: Impaired -  see Bedside swallow eval Self-Feeding  Abilities: Able to feed self Patient Positioning: Upright in chair Baseline Vocal Quality: Low vocal intensity;Breathy Volitional Cough: Strong Volitional Swallow: Able to elicit Anatomy: Within functional limits Pharyngeal Secretions: Not observed secondary MBS (pt does report issue with post nasal drip - chronic)    Reason for Referral Objectively evaluate swallowing function (concern for pharyngeal dysphagia s/p cva)   Oral Phase Oral Preparation/Oral Phase Oral Phase: Impaired Oral - Nectar Oral - Nectar Teaspoon: Weak lingual manipulation;Reduced posterior propulsion Oral - Nectar Cup: Weak lingual manipulation;Reduced posterior propulsion Oral - Thin Oral - Thin Teaspoon: Weak lingual manipulation;Reduced posterior propulsion Oral - Thin Cup: Weak lingual manipulation;Reduced posterior propulsion Oral - Thin Straw: Lingual/palatal residue;Weak lingual manipulation Oral - Solids Oral - Puree: Weak lingual manipulation;Reduced posterior propulsion Oral - Regular: Reduced posterior propulsion;Weak lingual manipulation Oral - Pill: Weak lingual manipulation;Reduced posterior propulsion   Pharyngeal Phase Pharyngeal Phase Pharyngeal Phase: Impaired Pharyngeal - Nectar Pharyngeal - Nectar Cup: Reduced airway/laryngeal closure;Pharyngeal residue - valleculae;Penetration/Aspiration during swallow Penetration/Aspiration details (nectar cup): Material enters airway, remains ABOVE vocal cords and not ejected out;Material enters airway, CONTACTS cords and not ejected out Pharyngeal - Nectar Straw: Reduced airway/laryngeal closure;Pharyngeal residue - valleculae;Penetration/Aspiration during swallow Penetration/Aspiration details (nectar straw): Material does not enter airway;Material enters airway, remains ABOVE vocal cords and not ejected out Pharyngeal - Thin Pharyngeal - Thin Teaspoon: Reduced airway/laryngeal closure;Reduced epiglottic inversion;Penetration/Aspiration during  swallow Penetration/Aspiration details (thin teaspoon): Material enters airway, remains ABOVE vocal cords and not ejected out Pharyngeal - Thin Cup: Reduced airway/laryngeal closure;Reduced epiglottic inversion;Pharyngeal residue - valleculae;Penetration/Aspiration during swallow Penetration/Aspiration details (thin cup): Material enters airway, CONTACTS cords and not ejected out Pharyngeal - Thin Straw: Reduced airway/laryngeal closure;Reduced epiglottic inversion;Pharyngeal residue - valleculae;Penetration/Aspiration during swallow Penetration/Aspiration details (thin straw): Material enters airway, CONTACTS cords and not ejected out Pharyngeal - Solids Pharyngeal - Puree: Reduced airway/laryngeal closure;Reduced epiglottic inversion;Pharyngeal residue - valleculae Pharyngeal - Regular: Reduced airway/laryngeal closure;Reduced epiglottic inversion;Pharyngeal residue - valleculae Pharyngeal Phase - Comment Pharyngeal Comment: chin tuck posture decreases amount of pharyngeal residuals, cued effortful dry swallow further decreased residuals and pt expectoration removed approximately 10% that pt did not clear into esophagus, head turn right not effective with or without chin tuck  Cervical Esophageal Phase    GO    Cervical Esophageal Phase Cervical Esophageal Phase: [place], [place] [facility] SLP [phone]

## 2014-02-03 NOTE — Progress Notes (Signed)
STROKE TEAM PROGRESS NOTE   HISTORY Amanda Cochran is an 80 y.o. female who was in her normal state of health on going to sleep 12/11 at 10 pm and awoke around 2 AM with slurred speech. EMS was called and code stroke was activated in route. On arrival, it was noted that she had a right facial droop as well as slurred speech, but was outside the window for IV TPA. Her symptoms are also mild enough that I do not think TPA would be merited treatment she had been a candidate.  LKW: 10 PM tpa given?: no, outside of window   SUBJECTIVE (INTERVAL HISTORY) The patient was seen today just after her MRI and during her barium swallow study. She reported that she had had bleeding with Plavix in the past. She apparently tolerates coated aspirin.   OBJECTIVE Temp:  [97.7 F (36.5 C)-98.3 F (36.8 C)] 98 F (36.7 C) (12/12 1430) Pulse Rate:  [70-135] 70 (12/12 1430) Cardiac Rhythm:  [-]  Resp:  [13-24] 18 (12/12 1430) BP: (170-225)/(69-158) 170/75 mmHg (12/12 1430) SpO2:  [93 %-98 %] 97 % (12/12 1430)   Recent Labs Lab 02/03/14 0424  GLUCAP 120*    Recent Labs Lab 02/03/14 0359 02/03/14 0407  NA 142 140  K 4.4 3.8  CL 105 106  CO2 23  --   GLUCOSE 119* 120*  BUN 19 20  CREATININE 0.86 0.90  CALCIUM 9.2  --     Recent Labs Lab 02/03/14 0359  AST 25  ALT 13  ALKPHOS 57  BILITOT 0.3  PROT 6.9  ALBUMIN 3.4*    Recent Labs Lab 02/03/14 0359 02/03/14 0407  WBC 9.1  --   NEUTROABS 4.7  --   HGB 13.6 14.6  HCT 41.5 43.0  MCV 87.6  --   PLT 218  --    No results for input(s): CKTOTAL, CKMB, CKMBINDEX, TROPONINI in the last 168 hours.  Recent Labs  02/03/14 0359  LABPROT 13.6  INR 1.03   No results for input(s): COLORURINE, LABSPEC, PHURINE, GLUCOSEU, HGBUR, BILIRUBINUR, KETONESUR, PROTEINUR, UROBILINOGEN, NITRITE, LEUKOCYTESUR in the last 72 hours.  Invalid input(s): APPERANCEUR  No results found for: CHOL, TRIG, HDL, CHOLHDL, VLDL, LDLCALC No results found for:  HGBA1C No results found for: LABOPIA, COCAINSCRNUR, LABBENZ, AMPHETMU, THCU, LABBARB  No results for input(s): ETH in the last 168 hours.  Ct Head (brain) Wo Contrast 02/03/2014    No acute intracranial abnormalities. Chronic atrophy and small vessel ischemic changes.     MRI / MRA Brain Wo Contrast 02/03/2014    1. Acute left basal ganglia infarct.  2. Moderate chronic small vessel ischemic disease and chronic infarcts as above.  3. No evidence of major intracranial arterial occlusion or significant proximal anterior circulation stenosis. Mild distal basilar stenosis and evidence of branch vessel atherosclerosis.      Dg Swallowing Func-speech Pathology 02/03/2014    Diet Recommendation - Dysphagia 3 (Mechanical Soft);Nectar-thick  liquid (tsps thin ok with chin tuck)     Follow Up Recommendations   (TBD)    Frequency and Duration min 2x/week  2 weeks         PHYSICAL EXAM Pleasant elderly lady not in distress.Awake alert. Afebrile. Head is nontraumatic. Neck is supple without bruit. Hearing is normal. Cardiac exam no murmur or gallop. Lungs are clear to auscultation. Distal pulses are well felt. Neurological Exam : Awake alert oriented x 3 normal speech and language. Mild right lower face asymmetry. Tongue midline.  No drift. Mild diminished fine finger movements on right. Orbits left over right upper extremity. Mild right grip weak.. Normal sensation . Normal coordination.  ASSESSMENT/PLAN Amanda Cochran is a 80 y.o. female with history of hyperlipidemia, hypertension, and thyroid disease,  presenting with speech difficulties.  She did not receive IV t-PA due to mild symptoms and unknown time of onset.   Stroke:  Dominant - Acute left basal ganglia infarct. Probably secondary to small vessel disease.  Resultant  speech and swallowing difficulties  MRI  as above  MRA  as above  Carotid Doppler - 1-39% ICA stenosis. Vertebral artery flow is antegrade.   2D Echo - ejection  fraction approximately 40%. No cardiac source of emboli identified.  LDL - pending  HgbA1c pending  Subcutaneous heparin for VTE prophylaxis  DIET DYS 3 with nectar thick liquids  aspirin 325 mg orally every day prior to admission, now on aspirin 325 mg orally every day  Patient counseled to be compliant with her antithrombotic medications  Ongoing aggressive stroke risk factor management  Therapy recommendations:  Pending  Disposition:  Pending  Hypertension  Home meds: - Lotensin 40 mg daily  Stable Permissive hypertension <220/120 for 24-48 hours and then gradually normalize within 5-7 days   Hyperlipidemia  Home meds:  Zocor 40 mg daily - resumed in hospital  LDL pending, goal < 70  Continue statin at discharge  Other Stroke Risk Factors  Advanced age   Other Active Problems  Intolerance to Plavix - GI bleeding  Dysphagia  Elevated CBGs  Other Pertinent History  Thyroid disease   Hospital day # 0  Mikey Bussing PA-C [facility] Pager [phone] 02/03/2014, 5:01 PM I have personally examined this patient, reviewed notes, independently viewed imaging studies, participated in medical decision making and plan of care. I have made any additions or clarifications directly to the above note. Agree with note above.  Left brain subcortical stroke from small vessel disease. Continue stroke work up. Antony Contras, MD Medical Director [facility] Pager: [phone]       To contact Stroke Continuity provider, please refer to [email or web address] After hours, contact General Neurology

## 2014-02-03 NOTE — ED Notes (Addendum)
NIH score and stroke times documented under "other flowsheet documentation" @ 0400 by RRN.

## 2014-02-03 NOTE — Progress Notes (Signed)
Echo Lab  2D Echocardiogram completed.  [place], RDCS 02/03/2014 3:20 PM

## 2014-02-03 NOTE — ED Notes (Signed)
Pt placed in a gown. Pt is hooked up to the cardiac monitor, BP cuff and pulse ox continuous.

## 2014-02-03 NOTE — Consult Note (Signed)
Neurology Consultation Reason for Consult: dysarthria/drrop Referring Physician: Lita Mains, D  CC: slurred speech  History is obtained from:patient  HPI: Amanda Cochran is a 80 y.o. female who was in her normal state of health on going to sleep 12/11 at 10 pm and awoke around 2 AM with slurred speech. EMS was called and code stroke was activated in route. On arrival, it was noted that she had a right facial droop as well as slurred speech, but was outside the window for IV TPA. Her symptoms are also mild enough that I do not think TPA would be merited treatment she had been a candidate.    LKW: 10 PM tpa given?: no, outside of window    ROS: A 14 point ROS was performed and is negative except as noted in the HPI.   Past Medical History  Diagnosis Date  . Thyroid disease   . Hyperlipidemia   . Hypertension   . Glaucoma   . Arthritis   . Bladder infection     Family History: Brother-stroke  Social History: Tob: Denies  Exam: Current vital signs: Filed Vitals:   02/03/14 0613  BP: 200/90  Pulse: 86  Temp:   Resp: 19    Vital signs in last 24 hours:     Physical Exam  Constitutional: Appears well-developed and well-nourished.  Psych: Affect appropriate to situation Eyes: No scleral injection HENT: No OP obstrucion Head: Normocephalic.  Cardiovascular: Normal rate and regular rhythm.  Respiratory: Effort normal and breath sounds normal to anterior ascultation GI: Soft.  No distension. There is no tenderness.  Skin: WDI  Neuro: Mental Status: Patient is awake, alert, oriented to person, place, month, year, and situation. Patient is able to give a clear and coherent history. No signs of aphasia or neglect Cranial Nerves: II: Visual Fields are full in the left eye little light perception in the right eye. Pupils are equal, round, and reactive to light.  III,IV, VI: EOMI without ptosis or diploplia.  V: Facial sensation is decreased on the right to  pinprick VII: Facial movement is decreased on the right VIII: hearing is intact to voice X: Uvula elevates symmetrically XI: Shoulder shrug is symmetric. XII: tongue is midline without atrophy or fasciculations.  Motor: Tone is normal. Bulk is normal. 5/5 strength was present on the left, possible mild weakness in the right hand. No drift Sensory: Sensation is symmetric to light touch and temperature in the arms and legs. Deep Tendon Reflexes: 2+ and symmetric in the biceps and patellae.  Cerebellar: FNF and HKS are intact bilaterally   I have reviewed labs in epic and the results pertinent to this consultation are: CMP-unremarkable  I have reviewed the images obtained: CT head-no acute findings  Impression: 80 year old female with likely new ischemic stroke causing right facial droop and dysarthria  Recommendations: 1. HgbA1c, fasting lipid panel 2. MRI, MRA  of the brain without contrast 3. Frequent neuro checks 4. Echocardiogram 5. Carotid dopplers 6. Prophylactic therapy-Antiplatelet med: Aspirin - dose 325mg  PO or 300mg  PR 7. Risk factor modification 8. Telemetry monitoring 9. PT consult, OT consult, Speech consult    Roland Rack, MD [facility] [phone]  If 7pm- 7am, please page neurology on call as listed in [place].

## 2014-02-03 NOTE — H&P (Signed)
[facility] History and Physical  [name]:[id number] DOB: Oct 22, 1928 DOA: 02/03/2014  Referring physician: Dr. Hurley Cisco PCP: Jerlyn Ly, MD   Chief Complaint: slurred speech  HPI: Amanda Cochran is a 80 y.o. female essential hypertension, and previous strokes currently on aspirin that comes in for sudden onset of slurred speech. She laid she woke up like at 2 AM and when she looked in mirror she had a facial droop. She called her neighbor and her neighbor cannot understand her. She denies any chest pain, shortness of breath no change in her medications.  In the ED: Code stroke was called she was out of the window for IV TPA so neurology recommended admission for further stratification.   Review of Systems:  Constitutional:  No weight loss, night sweats, Fevers, chills, fatigue.  HEENT:  No headaches, Difficulty swallowing,Tooth/dental problems,Sore throat,  No sneezing, itching, ear ache, nasal congestion, post nasal drip,  Cardio-vascular:  No chest pain, Orthopnea, PND, swelling in lower extremities, anasarca, dizziness, palpitations  GI:  No heartburn, indigestion, abdominal pain, nausea, vomiting, diarrhea, change in bowel habits, loss of appetite  Resp:  No shortness of breath with exertion or at rest. No excess mucus, no productive cough, No non-productive cough, No coughing up of blood.No change in color of mucus.No wheezing.No chest wall deformity  Skin:  no rash or lesions.  GU:  no dysuria, change in color of urine, no urgency or frequency. No flank pain.  Musculoskeletal:  No joint pain or swelling. No decreased range of motion. No back pain.  Psych:  No change in mood or affect. No depression or anxiety. No memory loss.   Past Medical History  Diagnosis Date  . Thyroid disease   . Hyperlipidemia   . Hypertension   . Glaucoma   . Arthritis   . Bladder infection    Past Surgical History  Procedure Laterality Date  . Thyroid surgery  1976  .  Tonsillectomy and adenoidectomy    . Vaginal prolapse repair     Social History:  reports that she has never smoked. She does not have any smokeless tobacco history on file. She reports that she does not drink alcohol or use illicit drugs.  No Known Allergies  History reviewed. No pertinent family history.   Prior to Admission medications   Medication Sig Start Date End Date Taking? Authorizing Provider  aspirin 325 MG tablet Take 325 mg by mouth at bedtime.   Yes Historical Provider, MD  benazepril (LOTENSIN) 40 MG tablet Take 40 mg by mouth daily.   Yes Historical Provider, MD  Calcium Carbonate (CALTRATE 600 PO) Take 1 tablet by mouth daily.    Yes Historical Provider, MD  Cholecalciferol (VITAMIN D) 2000 UNITS CAPS Take 1 capsule by mouth daily.   Yes Historical Provider, MD  dorzolamide-timolol (COSOPT) 22.3-6.8 MG/ML ophthalmic solution Place 1 drop into both eyes 2 (two) times daily.   Yes Historical Provider, MD  Fish Oil-Cholecalciferol (FISH OIL + D3) 1200-1000 MG-UNIT CAPS Take 1 tablet by mouth daily.    Yes Historical Provider, MD  latanoprost (XALATAN) 0.005 % ophthalmic solution Place 1 drop into both eyes at bedtime.    Yes Historical Provider, MD  levothyroxine (SYNTHROID, LEVOTHROID) 100 MCG tablet Take 100 mcg by mouth daily.   Yes Historical Provider, MD  meclizine (ANTIVERT) 25 MG tablet Take 1 tablet (25 mg total) by mouth 4 (four) times daily. Patient taking differently: Take 25 mg by mouth 3 (three) times daily as  needed for dizziness.  04/05/13  Yes Fredia Sorrow, MD  Multiple Vitamins-Minerals (CENTRUM SILVER PO) Take 1 tablet by mouth daily.   Yes Historical Provider, MD  ondansetron (ZOFRAN ODT) 4 MG disintegrating tablet Take 1 tablet (4 mg total) by mouth every 8 (eight) hours as needed for nausea or vomiting. 04/05/13  Yes Fredia Sorrow, MD  simvastatin (ZOCOR) 40 MG tablet Take 40 mg by mouth every evening.   Yes Historical Provider, MD  brinzolamide (AZOPT)  1 % ophthalmic suspension Place 1 drop into both eyes 2 (two) times daily.     Historical Provider, MD  escitalopram (LEXAPRO) 10 MG tablet Take 10 mg by mouth at bedtime. 03/28/13   Historical Provider, MD   Physical Exam: Filed Vitals:   02/03/14 0630 02/03/14 0645 02/03/14 0700 02/03/14 0735  BP: 225/86 173/85  217/85  Pulse: 91 76  80  Temp:   98.3 F (36.8 C) 97.9 F (36.6 C)  TempSrc:   Oral Oral  Resp: 20 19  20   SpO2: 96% 93%  97%    Wt Readings from Last 3 Encounters:  04/05/13 69.4 kg (153 lb)    General:  Appears calm and comfortable Eyes: PERRL, normal lids, irises & conjunctiva ENT: grossly normal hearing, lips & tongue Neck: no LAD, masses or thyromegaly Cardiovascular: RRR, no m/r/g. No LE edema. Telemetry: SR, no arrhythmias  Respiratory: CTA bilaterally, no w/r/r. Normal respiratory effort. Abdomen: soft, ntnd Skin: no rash or induration seen on limited exam Musculoskeletal: grossly normal tone BUE/BLE Psychiatric: grossly normal mood and affect, speech fluent and appropriate Neurologic: She is awake alert and oriented 334 and 6 are intact, facial sensation is intact but she has a facial droop on the right, she wears hearing aids, uvula is midline, muscle strength in her upper extremities is 5 out of 5 and symmetrical. Her tongue is midline without any fasciculations cessation is intact without. Muscle strength in lower extremities is 5 over 5 and symmetrical sensation is intact. Finger-to-nose is normal.           Labs on Admission:  Basic Metabolic Panel:  Recent Labs Lab 02/03/14 0359 02/03/14 0407  NA 142 140  K 4.4 3.8  CL 105 106  CO2 23  --   GLUCOSE 119* 120*  BUN 19 20  CREATININE 0.86 0.90  CALCIUM 9.2  --    Liver Function Tests:  Recent Labs Lab 02/03/14 0359  AST 25  ALT 13  ALKPHOS 57  BILITOT 0.3  PROT 6.9  ALBUMIN 3.4*   No results for input(s): LIPASE, AMYLASE in the last 168 hours. No results for input(s): AMMONIA in  the last 168 hours. CBC:  Recent Labs Lab 02/03/14 0359 02/03/14 0407  WBC 9.1  --   NEUTROABS 4.7  --   HGB 13.6 14.6  HCT 41.5 43.0  MCV 87.6  --   PLT 218  --    Cardiac Enzymes: No results for input(s): CKTOTAL, CKMB, CKMBINDEX, TROPONINI in the last 168 hours.  BNP (last 3 results) No results for input(s): PROBNP in the last 8760 hours. CBG:  Recent Labs Lab 02/03/14 0424  GLUCAP 120*    Radiological Exams on Admission: Ct Head (brain) Wo Contrast  02/03/2014   CLINICAL DATA:  Code stroke. Slurred speech and right-sided facial droop.  EXAM: CT HEAD WITHOUT CONTRAST  TECHNIQUE: Contiguous axial images were obtained from the base of the skull through the vertex without intravenous contrast.  COMPARISON:  MRI brain 04/05/2013.  CT head 04/05/2013.  FINDINGS: Ventricles and sulci appear symmetrical. Mild diffuse atrophy. Low-attenuation changes in the deep white matter consistent with small vessel ischemia. Old lacunar infarcts in the right basal ganglia and thalamus. No mass effect or midline shift. No abnormal extra-axial fluid collections. Gray-white matter junctions are distinct. Basal cisterns are not effaced. No evidence of acute intracranial hemorrhage. No depressed skull fractures. Mucosal thickening in the paranasal sinuses. Mastoid air cells are not opacified. Radiopaque densities demonstrated in what appears to be the oral cavity on the lowest slice. This may represent opaque medication in the patient's mouth.  IMPRESSION: No acute intracranial abnormalities. Chronic atrophy and small vessel ischemic changes.  These results were called by telephone at the time of interpretation on 02/03/2014 at 4:11 am to Dr. Leonel Ramsay, who verbally acknowledged these results.   Electronically Signed   By: Lucienne Capers M.D.   On: 02/03/2014 04:14    EKG: Independently reviewed. Sinus rhythm right axis deviation frequent PVCs and no ST segment abnormalities.  Assessment/Plan CVA  (cerebral vascular accident) - HgbA1c, fasting lipid panel  - MRI, MRA of the brain without contrast  - PT consult, OT consult, Speech consult  - Echocardiogram  - Carotid dopplers  - Prophylactic therapy-Antiplatelet med: Plavix - Avoid D5 fluids. - risk factor modification  - Cardiac Monitoring  - Neurochecks q4h  - Keep MAP 70   Essential hypertension - Allow permissive hypertension, use antihypertensive when necessary IV until swallowing evaluation past.  Hypothyroidism: - Recently she passes swallowing evaluation.  Code Status: full DVT Prophylaxis: heparin Family Communication: neighbor Disposition Plan: inpatient  Time spent: 2 minutes  Charlynne Cousins [facility] Pager [phone]

## 2014-02-03 NOTE — Evaluation (Signed)
Clinical/Bedside Swallow Evaluation Patient Details  Name: Amanda Cochran MRN: [id number] Date of Birth: Jul 03, 1928  Today's Date: 02/03/2014 Time: 0839-0920 SLP Time Calculation (min) (ACUTE ONLY): 41 min  Past Medical History:  Past Medical History  Diagnosis Date  . Thyroid disease   . Hyperlipidemia   . Hypertension   . Glaucoma   . Arthritis   . Bladder infection    Past Surgical History:  Past Surgical History  Procedure Laterality Date  . Thyroid surgery  1976  . Tonsillectomy and adenoidectomy    . Vaginal prolapse repair     HPI:  80 y.o. female essential hypertension, and previous strokes currently on aspirin that comes in for sudden onset of slurred speech. She laid she woke up like at 2 AM and when she looked in mirror she had a facial droop. She called her neighbor and her neighbor cannot understand her. She denies any chest pain, shortness of breath no change in her medications.  CT head negative, pt with old basal ganglia and thalamic cva.  Swallow evaluation ordered.    Assessment / Plan / Recommendation Clinical Impression  Pt presents with multiple cranial nerve deficits including trigeminal, facial, vagus, glossopharyngeal and hypoglossal nerve impairments.  She presents with inconsistent symptoms of oropharyngeal dysphagia and concern for pharyngeal stasis and possible aspiration.  Right anterior labial spillage noted with mildly delayed oral transiting.  Pharyngeal swallow impairment suspected as pt reported sensation of pharyngeal residuals (right Head turn right with dry/liquid swallows attempted to clear sensation but was inconsistently effective).  Overt coughing noted with thin x1 with pt's face turning red and mild distress- suspect aspiration.  SLP can not rule out silent or significant nature of pharyngeal dysphagia and therefore recommend to proceed with MBS.  Please note, pt also became fatigued during evaluation-stating she was "tired".  Skilled SLP  intervention included trials of compensation strategies and clinical reasoning for instrumental testing.      Aspiration Risk  Moderate    Diet Recommendation NPO (pending MBS)   Medication Administration: Whole meds with puree    Other  Recommendations Recommended Consults: MBS   Follow Up Recommendations    TBD   Frequency and Duration   TBD     Pertinent Vitals/Pain Afebrile, decreased    SLP Swallow Goals   tbd  Swallow Study Prior Functional Status   pt reports h/o thyroid issues with thyroid impinging into larynx requiring surgical removal many years ago=chronic hoarseness noted before and after surgery per pt    General Date of Onset: 02/03/14 HPI: 80 y.o. female essential hypertension, and previous strokes currently on aspirin that comes in for sudden onset of slurred speech. She laid she woke up like at 2 AM and when she looked in mirror she had a facial droop. She called her neighbor and her neighbor cannot understand her. She denies any chest pain, shortness of breath no change in her medications.  CT head negative, pt with old basal ganglia and thalamic cva.  Swallow evaluation ordered.  Type of Study: Bedside swallow evaluation Diet Prior to this Study: NPO Respiratory Status: Room air History of Recent Intubation: No Behavior/Cognition: Alert;Cooperative;Pleasant mood Oral Cavity - Dentition: Adequate natural dentition Self-Feeding Abilities: Able to feed self (right handed) Patient Positioning: Upright in bed Baseline Vocal Quality: Low vocal intensity;Breathy Volitional Cough: Strong Volitional Swallow: Able to elicit    Oral/Motor/Sensory Function Overall Oral Motor/Sensory Function: Impaired Labial ROM: Reduced right Labial Symmetry: Within Functional Limits Labial Strength: Reduced Labial  Sensation: Reduced Lingual ROM: Reduced right Lingual Symmetry: Abnormal symmetry right Lingual Strength: Reduced Lingual Sensation: Reduced Facial ROM: Reduced  right Facial Symmetry: Right droop Facial Strength: Reduced Facial Sensation: Reduced Velum:  (deviates to left) Mandible: Within Functional Limits   Ice Chips Ice chips: Not tested   Thin Liquid Thin Liquid: Impaired Presentation: Cup;Straw;Self Fed Oral Phase Impairments: Impaired anterior to posterior transit;Reduced labial seal Oral Phase Functional Implications: Prolonged oral transit;Right anterior spillage Pharyngeal  Phase Impairments: Suspected delayed Swallow;Multiple swallows;Cough - Delayed (audible swallow) Other Comments: cough x1 of approximately 15 boluses, with oral residuals of crackers    Nectar Thick Nectar Thick Liquid: Not tested   Honey Thick Honey Thick Liquid: Not tested   Puree Puree: Impaired Presentation: Self Fed;Spoon Oral Phase Impairments: Reduced lingual movement/coordination;Impaired anterior to posterior transit;Reduced labial seal Pharyngeal Phase Impairments: Suspected delayed Swallow;Multiple swallows Other Comments: pt reports sensation of pharyngeal residuals on left side of pharynx that she states clears with dry or liquid swallows   Solid   GO    Solid: Impaired Presentation: Self Fed Oral Phase Impairments: Reduced labial seal;Reduced lingual movement/coordination;Impaired anterior to posterior transit Oral Phase Functional Implications: Other (comment) (prolonged oral transiting) Pharyngeal Phase Impairments: Suspected delayed Swallow Other Comments: c/o residuals in pharynx, right side,eliminated with head turn right and dry swallow but presentation is inconsistent       Luanna Salk, [facility] SLP [phone]

## 2014-02-03 NOTE — ED Notes (Signed)
Patient here from home via EMS with left sided facial droop, slurred speech, and severe hypertension. EMS reported SBPs as high as 270. Code stroke activated en route. Labetalol given by EMS to correct BP.

## 2014-02-04 DIAGNOSIS — E785 Hyperlipidemia, unspecified: Secondary | ICD-10-CM

## 2014-02-04 DIAGNOSIS — I10 Essential (primary) hypertension: Secondary | ICD-10-CM

## 2014-02-04 LAB — LIPID PANEL
CHOLESTEROL: 188 mg/dL (ref 0–200)
HDL: 50 mg/dL (ref >39)
LDL Cholesterol: 86 mg/dL (ref 0–99)
Total CHOL/HDL Ratio: 3.8 RATIO
Triglycerides: 258 mg/dL — ABNORMAL HIGH (ref <150)
VLDL: 52 mg/dL — ABNORMAL HIGH (ref 0–40)

## 2014-02-04 LAB — HEMOGLOBIN A1C
Hgb A1c MFr Bld: 6 % — ABNORMAL HIGH (ref <5.7)
MEAN PLASMA GLUCOSE: 126 mg/dL — AB (ref <117)

## 2014-02-04 MED ORDER — BENAZEPRIL HCL 20 MG PO TABS
40.0000 mg | ORAL_TABLET | Freq: Every day | ORAL | Status: DC
  Administered 2014-02-04 – 2014-02-07 (×4): 40 mg via ORAL
  Filled 2014-02-04 (×4): qty 2

## 2014-02-04 MED ORDER — ONDANSETRON HCL 4 MG/2ML IJ SOLN
4.0000 mg | INTRAMUSCULAR | Status: DC | PRN
  Administered 2014-02-04 – 2014-02-06 (×2): 4 mg via INTRAVENOUS
  Filled 2014-02-04 (×2): qty 2

## 2014-02-04 MED ORDER — ACETAMINOPHEN 325 MG PO TABS
650.0000 mg | ORAL_TABLET | Freq: Four times a day (QID) | ORAL | Status: DC | PRN
  Administered 2014-02-04 – 2014-02-05 (×2): 650 mg via ORAL
  Filled 2014-02-04 (×3): qty 2

## 2014-02-04 MED ORDER — ASPIRIN EC 81 MG PO TBEC
81.0000 mg | DELAYED_RELEASE_TABLET | Freq: Every day | ORAL | Status: DC
  Administered 2014-02-05 – 2014-02-07 (×3): 81 mg via ORAL
  Filled 2014-02-04 (×3): qty 1

## 2014-02-04 MED ORDER — ASPIRIN-DIPYRIDAMOLE ER 25-200 MG PO CP12
1.0000 | ORAL_CAPSULE | Freq: Every day | ORAL | Status: DC
  Administered 2014-02-04 – 2014-02-06 (×3): 1 via ORAL
  Filled 2014-02-04 (×3): qty 1

## 2014-02-04 MED ORDER — LEVOTHYROXINE SODIUM 100 MCG PO TABS
100.0000 ug | ORAL_TABLET | Freq: Every day | ORAL | Status: DC
  Administered 2014-02-04 – 2014-02-07 (×4): 100 ug via ORAL
  Filled 2014-02-04 (×4): qty 1

## 2014-02-04 NOTE — Progress Notes (Signed)
Pts BP 192/80 manually on the right arm.  Notified MD to make them aware and pushed 5mg  Hydralazine.  Will continue to monitor.

## 2014-02-04 NOTE — Progress Notes (Signed)
STROKE TEAM PROGRESS NOTE   HISTORY Amanda Cochran is an 80 y.o. female who was in her normal state of health on going to sleep 12/11 at 10 pm and awoke around 2 AM with slurred speech. EMS was called and code stroke was activated in route. On arrival, it was noted that she had a right facial droop as well as slurred speech, but was outside the window for IV TPA. Her symptoms are also mild enough that I do not think TPA would be merited treatment she had been a candidate.  LKW: 10 PM tpa given?: no, outside of window   SUBJECTIVE (INTERVAL HISTORY) The patient's nephew is at the bedside today. Dr. Leonie Man discussed the antiplatelet options for the patient. She reports previous bleeding with Plavix and stomach upset with aspirin. Dr. Leonie Man suggested a trial of Aggrenox.   OBJECTIVE Temp:  [97.6 F (36.4 C)-98.8 F (37.1 C)] 97.9 F (36.6 C) (12/13 0743) Pulse Rate:  [45-135] 86 (12/13 0743) Cardiac Rhythm:  [-] Heart block (12/12 2000) Resp:  [18-22] 20 (12/13 0630) BP: (143-199)/(61-143) 143/68 mmHg (12/13 0743) SpO2:  [95 %-98 %] 96 % (12/13 0743)   Recent Labs Lab 02/03/14 0424  GLUCAP 120*    Recent Labs Lab 02/03/14 0359 02/03/14 0407  NA 142 140  K 4.4 3.8  CL 105 106  CO2 23  --   GLUCOSE 119* 120*  BUN 19 20  CREATININE 0.86 0.90  CALCIUM 9.2  --     Recent Labs Lab 02/03/14 0359  AST 25  ALT 13  ALKPHOS 57  BILITOT 0.3  PROT 6.9  ALBUMIN 3.4*    Recent Labs Lab 02/03/14 0359 02/03/14 0407  WBC 9.1  --   NEUTROABS 4.7  --   HGB 13.6 14.6  HCT 41.5 43.0  MCV 87.6  --   PLT 218  --    No results for input(s): CKTOTAL, CKMB, CKMBINDEX, TROPONINI in the last 168 hours.  Recent Labs  02/03/14 0359  LABPROT 13.6  INR 1.03   No results for input(s): COLORURINE, LABSPEC, PHURINE, GLUCOSEU, HGBUR, BILIRUBINUR, KETONESUR, PROTEINUR, UROBILINOGEN, NITRITE, LEUKOCYTESUR in the last 72 hours.  Invalid input(s): APPERANCEUR     Component Value  Date/Time   CHOL 188 02/04/2014 0705   TRIG 258* 02/04/2014 0705   HDL 50 02/04/2014 0705   CHOLHDL 3.8 02/04/2014 0705   VLDL 52* 02/04/2014 0705   LDLCALC 86 02/04/2014 0705   No results found for: HGBA1C No results found for: LABOPIA, COCAINSCRNUR, LABBENZ, AMPHETMU, THCU, LABBARB  No results for input(s): ETH in the last 168 hours.  Ct Head (brain) Wo Contrast 02/03/2014    No acute intracranial abnormalities. Chronic atrophy and small vessel ischemic changes.     MRI / MRA Brain Wo Contrast 02/03/2014    1. Acute left basal ganglia infarct.  2. Moderate chronic small vessel ischemic disease and chronic infarcts as above.  3. No evidence of major intracranial arterial occlusion or significant proximal anterior circulation stenosis. Mild distal basilar stenosis and evidence of branch vessel atherosclerosis.      Dg Swallowing Func-speech Pathology 02/03/2014    Diet Recommendation - Dysphagia 3 (Mechanical Soft);Nectar-thick  liquid (tsps thin ok with chin tuck)     Follow Up Recommendations   (TBD)    Frequency and Duration min 2x/week  2 weeks         PHYSICAL EXAM Pleasant elderly lady not in distress.Awake alert. Afebrile. Head is nontraumatic. Neck is supple  without bruit. Hearing is normal. Cardiac exam no murmur or gallop. Lungs are clear to auscultation. Distal pulses are well felt. Neurological Exam :  Awake alert oriented x 3 normal speech and language. Mild right lower face asymmetry. Tongue midline. No drift. Mild diminished fine finger movements on rightt. Orbits left over right upper extremity. Mild right grip weak.. Normal sensation . Normal coordination.    ASSESSMENT/PLAN [name] is a 80 y.o. female with history of hyperlipidemia, hypertension, and thyroid disease,  presenting with speech difficulties.  She did not receive IV t-PA due to mild symptoms and unknown time of onset.   Stroke:  Dominant - Acute left basal ganglia infarct. Probably  secondary to small vessel disease.  Resultant  speech and swallowing difficulties  MRI  as above  MRA  as above  Carotid Doppler - 1-39% ICA stenosis. Vertebral artery flow is antegrade.   2D Echo - ejection fraction approximately 40%. No cardiac source of emboli identified.  LDL - 86  HgbA1c pending - no history of diabetes mellitus  Subcutaneous heparin for VTE prophylaxis  DIET DYS 3 with nectar thick liquids  aspirin 325 mg orally every day prior to admission, now on aspirin 325 mg orally every day  Patient counseled to be compliant with her antithrombotic medications  Ongoing aggressive stroke risk factor management  Therapy recommendations:  CIR  Disposition:  Inpatient rehabilitation screening ordered.  Hypertension  Home meds: - Lotensin 40 mg daily  Stable Permissive hypertension <220/120 for 24-48 hours and then gradually normalize within 5-7 days   Hyperlipidemia  Home meds:  Zocor 40 mg daily - resumed in hospital  LDL 86 goal < 70  Already on Zocor 40 mg daily - maximum recommended dose - ? compliance.  Continue statin at discharge  Other Stroke Risk Factors  Advanced age   Other Active Problems  Intolerance to Plavix - GI bleeding. Stomach upset with aspirin.  Trial of Aggrenox as discussed with the patient and her nephew.  To start Aggrenox therapy: 1. ECASA 81 mg each morning and one Aggrenox each evening for 2 weeks. 2. If patient tolerates medications stop the enteric-coated aspirin and give Aggrenox 1 twice each day. 3. If problematic headaches try Tylenol 650 mg 30 minutes prior to Aggrenox.   Dysphagia - dysphagia 3 diet with nectar thick liquids  Elevated CBGs  Other Pertinent History  Thyroid disease  The stroke team will sign off at this time. Please call if we can be of further service.  Hospital day # 1  Mikey Bussing PA-C [facility] Pager [phone] 02/04/2014, 10:12 AM I have personally  examined this patient, reviewed notes, independently viewed imaging studies, participated in medical decision making and plan of care. I have made any additions or clarifications directly to the above note. Agree with note above. Will sign off. Call for questions.  Antony Contras, MD Medical Director [facility] Pager: [phone] 02/04/2014 4:49 PM     To contact Stroke Continuity provider, please refer to [email or web address] After hours, contact General Neurology

## 2014-02-04 NOTE — Progress Notes (Signed)
[facility] PROGRESS NOTE  Amanda Cochran ZOX:[id number] DOB: 1928/03/05 DOA: 02/03/2014 PCP: Jerlyn Ly, MD  Assessment/Plan: 1. Acute L basal ganglia CVA 1. Neurology following 2. No carotid stenosis 3. 2d echo with EF 40%, no thrombus 2. HTN 1. BP stable 2. Follow 3. Hypothyroid 4. DVT prophylaxis 1. Heparin subQ  Code Status: Full Family Communication: Pt in room Disposition Plan: Pending   Consultants:  Neurology  Procedures:    Antibiotics:   (indicate start date, and stop date if known)  HPI/Subjective: No complaints. Pt is in good spirits. Reports feeling better today  Objective: Filed Vitals:   02/04/14 0615 02/04/14 0624 02/04/14 0630 02/04/14 0743  BP: 190/84 192/80 147/86 143/68  Pulse: 70  101 86  Temp:   98.4 F (36.9 C) 97.9 F (36.6 C)  TempSrc:   Oral   Resp:   20   SpO2:   95% 96%   No intake or output data in the 24 hours ending 02/04/14 1403 There were no vitals filed for this visit.  Exam:   General:  Awake, in nad  Cardiovascular: regular,s1, s2  Respiratory: normal resp effort, no wheezing  Abdomen: soft, nondistended  Musculoskeletal: perfused, no clubbing   Data Reviewed: Basic Metabolic Panel:  Recent Labs Lab 02/03/14 0359 02/03/14 0407  NA 142 140  K 4.4 3.8  CL 105 106  CO2 23  --   GLUCOSE 119* 120*  BUN 19 20  CREATININE 0.86 0.90  CALCIUM 9.2  --    Liver Function Tests:  Recent Labs Lab 02/03/14 0359  AST 25  ALT 13  ALKPHOS 57  BILITOT 0.3  PROT 6.9  ALBUMIN 3.4*   No results for input(s): LIPASE, AMYLASE in the last 168 hours. No results for input(s): AMMONIA in the last 168 hours. CBC:  Recent Labs Lab 02/03/14 0359 02/03/14 0407  WBC 9.1  --   NEUTROABS 4.7  --   HGB 13.6 14.6  HCT 41.5 43.0  MCV 87.6  --   PLT 218  --    Cardiac Enzymes: No results for input(s): CKTOTAL, CKMB, CKMBINDEX, TROPONINI in the last 168 hours. BNP (last 3 results) No results for  input(s): PROBNP in the last 8760 hours. CBG:  Recent Labs Lab 02/03/14 0424  GLUCAP 120*    No results found for this or any previous visit (from the past 240 hour(s)).   Studies: Ct Head (brain) Wo Contrast  02/03/2014   CLINICAL DATA:  Code stroke. Slurred speech and right-sided facial droop.  EXAM: CT HEAD WITHOUT CONTRAST  TECHNIQUE: Contiguous axial images were obtained from the base of the skull through the vertex without intravenous contrast.  COMPARISON:  MRI brain 04/05/2013.  CT head 04/05/2013.  FINDINGS: Ventricles and sulci appear symmetrical. Mild diffuse atrophy. Low-attenuation changes in the deep white matter consistent with small vessel ischemia. Old lacunar infarcts in the right basal ganglia and thalamus. No mass effect or midline shift. No abnormal extra-axial fluid collections. Gray-white matter junctions are distinct. Basal cisterns are not effaced. No evidence of acute intracranial hemorrhage. No depressed skull fractures. Mucosal thickening in the paranasal sinuses. Mastoid air cells are not opacified. Radiopaque densities demonstrated in what appears to be the oral cavity on the lowest slice. This may represent opaque medication in the patient's mouth.  IMPRESSION: No acute intracranial abnormalities. Chronic atrophy and small vessel ischemic changes.  These results were called by telephone at the time of interpretation on 02/03/2014 at 4:11 am to Dr.  Leonel Ramsay, who verbally acknowledged these results.   Electronically Signed   By: Lucienne Capers M.D.   On: 02/03/2014 04:14   Mr Brain Wo Contrast  02/03/2014   CLINICAL DATA:  Acute onset slurred speech and right facial droop this morning. Stroke.  EXAM: MRI HEAD WITHOUT CONTRAST  MRA HEAD WITHOUT CONTRAST  TECHNIQUE: Multiplanar, multiecho pulse sequences of the brain and surrounding structures were obtained without intravenous contrast. Angiographic images of the head were obtained using MRA technique without  contrast.  COMPARISON:  02/03/2014 head CT and 04/05/2013 MRI  FINDINGS: MRI HEAD FINDINGS  There is a small, acute left lateral lenticulostriate territory infarct involving the body of the caudate, corona radiata, and posterior lentiform nucleus. Tiny foci of heterogeneously increased signal in the posterior midbrain near the colliculi on axial diffusion images are not confirmed on coronal images and felt to be artifactual.  There is no evidence of intracranial hemorrhage, mass, midline shift, or extra-axial fluid collection. Small, chronic cortical infarcts are again seen in the right temporal, right parietal, and right occipital lobes, unchanged. Patchy T2 hyperintensities elsewhere in the subcortical and deep cerebral white matter are unchanged from the prior MRI nonspecific but compatible with moderate chronic small vessel ischemic disease. There is mild cerebral atrophy, within normal limits for age. Chronic lacunar infarcts are again seen in the right basal ganglia.  Prior bilateral cataract extraction is noted. There is mild right maxillary sinus mucosal thickening. No significant mastoid air cell fluid is seen. Major intracranial vascular flow voids are preserved.  MRA HEAD FINDINGS  Images are mildly degraded by motion artifact.  Visualized distal vertebral arteries are patent with the left being mildly dominant. PICA origins are patent. Right AICA origin is patent. SCA origins are patent. There is evidence of mild distal basilar artery stenosis at the level of the SCA origins, although evaluation is partially limited by motion artifact through this area. There is a fetal type origin of the left PCA with the left P1 segment appearing hypoplastic and irregular. PCAs are mildly irregular diffusely without evidence of significant proximal stenosis.  Internal carotid arteries are patent from skullbase to carotid termini without evidence of stenosis. There is a slightly bulbous appearance of the left posterior  communicating artery origin which is favored to reflect motion artifact and possibly mild infundibular dilatation without discrete aneurysm identified. ACAs are unremarkable. M1 segments are patent without stenosis. No major MCA branch vessel occlusion is identified, although there is mild-to-moderate bilateral branch vessel irregularities suggestive of atherosclerosis.  IMPRESSION: 1. Acute left basal ganglia infarct. 2. Moderate chronic small vessel ischemic disease and chronic infarcts as above. 3. No evidence of major intracranial arterial occlusion or significant proximal anterior circulation stenosis. Mild distal basilar stenosis and evidence of branch vessel atherosclerosis.   Electronically Signed   By: Logan Bores   On: 02/03/2014 11:52   Dg Swallowing Func-speech Pathology  02/03/2014   Macario Golds, CCC-SLP     02/03/2014 12:42 PM Objective Swallowing Evaluation: Modified Barium Swallowing Study   Patient Details  Name: Amanda Cochran MRN: [id number] Date of Birth: 1928/04/23  Today's Date: 02/03/2014 Time: 1133-1200 SLP Time Calculation (min) (ACUTE ONLY): 27 min  Past Medical History:  Past Medical History  Diagnosis Date  . Thyroid disease   . Hyperlipidemia   . Hypertension   . Glaucoma   . Arthritis   . Bladder infection    Past Surgical History:  Past Surgical History  Procedure Laterality Date  .  Thyroid surgery  1976  . Tonsillectomy and adenoidectomy    . Vaginal prolapse repair     HPI:  80 y.o. female essential hypertension, and previous strokes  currently on aspirin that comes in for sudden onset of slurred  speech. She laid she woke up like at 2 AM and when she looked in  mirror she had a facial droop. She called her neighbor and her  neighbor cannot understand her. She denies any chest pain,  shortness of breath no change in her medications.  CT head  negative, pt with old basal ganglia and thalamic cva.  Swallow  evaluation ordered.      Assessment / Plan / Recommendation Clinical  Impression  Dysphagia Diagnosis: Mild oral phase dysphagia;Mild pharyngeal  phase dysphagia Clinical impression: Mild sensorimotor oropharyngeal dysphagia  with delayed oral transiting, decreased laryngeal  elevation/closure and impaired epiglottic deflection.  Laryngeal  penetration noted with liquids - *nectar,thin* to vocal cords.   Use of chin tuck with nectar decreased penetration to minimal and  cued cough/throat clear effective to remove penetrates.  Mild  vallecular residuals noted without pt full sensation.  Chin tuck  posture also decreases amount of pharyngeal residuals as well as  cued effortful dry swallow.  Pt expectoration removed  approximately 10% of vallecular residuals that pt did not clear  into esophagus, head turn right not effective with or without  chin tuck.  Recommend modified diet to maximize airway  protection.  Skilled intervention included educating pt to  findings and reinforcing effective compensation strategies.      Treatment Recommendation  Therapy as outlined in treatment plan below    Diet Recommendation Dysphagia 3 (Mechanical Soft);Nectar-thick  liquid (tsps thin ok with chin tuck)   Liquid Administration via: Cup;Straw;Spoon Medication Administration: Crushed with puree Supervision: Patient able to self feed;Full supervision/cueing  for compensatory strategies Compensations: Slow rate;Small sips/bites;Clear throat  intermittently;Effortful swallow;Multiple dry swallows after each  bite/sip Postural Changes and/or Swallow Maneuvers: Seated upright 90  degrees;Upright 30-60 min after meal;Chin tuck (chin tuck with  ALL boluses)    Other  Recommendations Recommended Consults: MBS Oral Care Recommendations: Oral care BID Other Recommendations: Have oral suction available   Follow Up Recommendations   (TBD)    Frequency and Duration min 2x/week  2 weeks     General Date of Onset: 02/03/14 HPI: 80 y.o. female essential hypertension, and previous strokes  currently on aspirin that  comes in for sudden onset of slurred  speech. She laid she woke up like at 2 AM and when she looked in  mirror she had a facial droop. She called her neighbor and her  neighbor cannot understand her. She denies any chest pain,  shortness of breath no change in her medications.  CT head  negative, pt with old basal ganglia and thalamic cva.  Swallow  evaluation ordered.  Type of Study: Modified Barium Swallowing Study Reason for Referral: Objectively evaluate swallowing function  (concern for pharyngeal dysphagia s/p cva) Diet Prior to this Study: NPO Temperature Spikes Noted: No Respiratory Status: Room air History of Recent Intubation: No Behavior/Cognition: Alert;Cooperative;Pleasant mood Oral Cavity - Dentition: Adequate natural dentition Oral Motor / Sensory Function: Impaired - see Bedside swallow  eval Self-Feeding Abilities: Able to feed self Patient Positioning: Upright in chair Baseline Vocal Quality: Low vocal intensity;Breathy Volitional Cough: Strong Volitional Swallow: Able to elicit Anatomy: Within functional limits Pharyngeal Secretions: Not observed secondary MBS (pt does report  issue with post nasal drip - chronic)  Reason for Referral Objectively evaluate swallowing function  (concern for pharyngeal dysphagia s/p cva)   Oral Phase Oral Preparation/Oral Phase Oral Phase: Impaired Oral - Nectar Oral - Nectar Teaspoon: Weak lingual manipulation;Reduced  posterior propulsion Oral - Nectar Cup: Weak lingual manipulation;Reduced posterior  propulsion Oral - Thin Oral - Thin Teaspoon: Weak lingual manipulation;Reduced posterior  propulsion Oral - Thin Cup: Weak lingual manipulation;Reduced posterior  propulsion Oral - Thin Straw: Lingual/palatal residue;Weak lingual  manipulation Oral - Solids Oral - Puree: Weak lingual manipulation;Reduced posterior  propulsion Oral - Regular: Reduced posterior propulsion;Weak lingual  manipulation Oral - Pill: Weak lingual manipulation;Reduced posterior  propulsion    Pharyngeal Phase Pharyngeal Phase Pharyngeal Phase: Impaired Pharyngeal - Nectar Pharyngeal - Nectar Cup: Reduced airway/laryngeal  closure;Pharyngeal residue - valleculae;Penetration/Aspiration  during swallow Penetration/Aspiration details (nectar cup): Material enters  airway, remains ABOVE vocal cords and not ejected out;Material  enters airway, CONTACTS cords and not ejected out Pharyngeal - Nectar Straw: Reduced airway/laryngeal  closure;Pharyngeal residue - valleculae;Penetration/Aspiration  during swallow Penetration/Aspiration details (nectar straw): Material does not  enter airway;Material enters airway, remains ABOVE vocal cords  and not ejected out Pharyngeal - Thin Pharyngeal - Thin Teaspoon: Reduced airway/laryngeal  closure;Reduced epiglottic inversion;Penetration/Aspiration  during swallow Penetration/Aspiration details (thin teaspoon): Material enters  airway, remains ABOVE vocal cords and not ejected out Pharyngeal - Thin Cup: Reduced airway/laryngeal closure;Reduced  epiglottic inversion;Pharyngeal residue -  valleculae;Penetration/Aspiration during swallow Penetration/Aspiration details (thin cup): Material enters  airway, CONTACTS cords and not ejected out Pharyngeal - Thin Straw: Reduced airway/laryngeal closure;Reduced  epiglottic inversion;Pharyngeal residue -  valleculae;Penetration/Aspiration during swallow Penetration/Aspiration details (thin straw): Material enters  airway, CONTACTS cords and not ejected out Pharyngeal - Solids Pharyngeal - Puree: Reduced airway/laryngeal closure;Reduced  epiglottic inversion;Pharyngeal residue - valleculae Pharyngeal - Regular: Reduced airway/laryngeal closure;Reduced  epiglottic inversion;Pharyngeal residue - valleculae Pharyngeal Phase - Comment Pharyngeal Comment: chin tuck posture decreases amount of  pharyngeal residuals, cued effortful dry swallow further  decreased residuals and pt expectoration removed approximately  10% that pt did not clear  into esophagus, head turn right not  effective with or without chin tuck  Cervical Esophageal Phase    GO    Cervical Esophageal Phase Cervical Esophageal Phase: Dahlia Byes, MS [facility] SLP [phone]    Mr Amanda Cochran Head/brain Wo Cm  02/03/2014   CLINICAL DATA:  Acute onset slurred speech and right facial droop this morning. Stroke.  EXAM: MRI HEAD WITHOUT CONTRAST  MRA HEAD WITHOUT CONTRAST  TECHNIQUE: Multiplanar, multiecho pulse sequences of the brain and surrounding structures were obtained without intravenous contrast. Angiographic images of the head were obtained using MRA technique without contrast.  COMPARISON:  02/03/2014 head CT and 04/05/2013 MRI  FINDINGS: MRI HEAD FINDINGS  There is a small, acute left lateral lenticulostriate territory infarct involving the body of the caudate, corona radiata, and posterior lentiform nucleus. Tiny foci of heterogeneously increased signal in the posterior midbrain near the colliculi on axial diffusion images are not confirmed on coronal images and felt to be artifactual.  There is no evidence of intracranial hemorrhage, mass, midline shift, or extra-axial fluid collection. Small, chronic cortical infarcts are again seen in the right temporal, right parietal, and right occipital lobes, unchanged. Patchy T2 hyperintensities elsewhere in the subcortical and deep cerebral white matter are unchanged from the prior MRI nonspecific but compatible with moderate chronic small vessel ischemic disease. There is mild cerebral atrophy, within normal limits for age. Chronic lacunar  infarcts are again seen in the right basal ganglia.  Prior bilateral cataract extraction is noted. There is mild right maxillary sinus mucosal thickening. No significant mastoid air cell fluid is seen. Major intracranial vascular flow voids are preserved.  MRA HEAD FINDINGS  Images are mildly degraded by motion artifact.  Visualized distal vertebral arteries are patent with the left being mildly  dominant. PICA origins are patent. Right AICA origin is patent. SCA origins are patent. There is evidence of mild distal basilar artery stenosis at the level of the SCA origins, although evaluation is partially limited by motion artifact through this area. There is a fetal type origin of the left PCA with the left P1 segment appearing hypoplastic and irregular. PCAs are mildly irregular diffusely without evidence of significant proximal stenosis.  Internal carotid arteries are patent from skullbase to carotid termini without evidence of stenosis. There is a slightly bulbous appearance of the left posterior communicating artery origin which is favored to reflect motion artifact and possibly mild infundibular dilatation without discrete aneurysm identified. ACAs are unremarkable. M1 segments are patent without stenosis. No major MCA branch vessel occlusion is identified, although there is mild-to-moderate bilateral branch vessel irregularities suggestive of atherosclerosis.  IMPRESSION: 1. Acute left basal ganglia infarct. 2. Moderate chronic small vessel ischemic disease and chronic infarcts as above. 3. No evidence of major intracranial arterial occlusion or significant proximal anterior circulation stenosis. Mild distal basilar stenosis and evidence of branch vessel atherosclerosis.   Electronically Signed   By: Logan Bores   On: 02/03/2014 11:52    Scheduled Meds: .  stroke: mapping our early stages of recovery book   Does not apply Once  . aspirin EC  325 mg Oral Daily  . benazepril  40 mg Oral Daily  . dorzolamide-timolol  1 drop Both Eyes BID  . escitalopram  10 mg Oral QHS  . heparin  5,000 Units Subcutaneous 3 times per day  . latanoprost  1 drop Both Eyes QHS  . simvastatin  40 mg Oral q1800   Continuous Infusions:   Active Problems:   CVA (cerebral vascular accident)   Essential hypertension   Acute CVA (cerebrovascular accident)    Time spent: 30min    , [place]  [facility] Pager [phone]. If 7PM-7AM, please contact night-coverage at [email or web address], password [facility] 02/04/2014, 2:03 PM  LOS: 1 day

## 2014-02-04 NOTE — Evaluation (Signed)
Physical Therapy Evaluation Patient Details Name: Amanda Cochran MRN: [id number] DOB: [birth year] Today's Date: 02/04/2014   History of Present Illness  80 y.o. female admitted with slurred speech found to have Acute left basal ganglia infarct. IV TPA not administered.  Clinical Impression  Pt admitted with above diagnosis. Pt currently with functional limitations due to the deficits listed below (see PT Problem List). Pt was independent with a white cane for blindness prior to admission. She lives at home alone. Requires min assist for balance while ambulating at this time. Mildly dyspneic after ambulating 50 feet but unable to obtain SpO2. If patient declined by CIR, she will require short term SNF for continued therapy services in order to regain functional independence. Pt will benefit from skilled PT to increase their independence and safety with mobility to allow discharge to the venue listed below.       Follow Up Recommendations CIR    Equipment Recommendations  Rolling walker with 5" wheels    Recommendations for Other Services Rehab consult;OT consult;Speech consult     Precautions / Restrictions Precautions Precautions: Fall Restrictions Weight Bearing Restrictions: No      Mobility  Bed Mobility Overal bed mobility: Needs Assistance Bed Mobility: Supine to Sit     Supine to sit: Min assist     General bed mobility comments: Min assist for guidance to sit at edge of bed. VC for technique. Scoots forward to edge of bed without assist.  Transfers Overall transfer level: Needs assistance Equipment used: 1 person hand held assist Transfers: Sit to/from Stand Sit to Stand: Min assist         General transfer comment: Min assist for stability from lowest bed setting with hand held assist.  Ambulation/Gait Ambulation/Gait assistance: Min assist Ambulation Distance (Feet): 100 Feet Assistive device: Rolling walker (2 wheeled);1 person hand held assist Gait  Pattern/deviations: Step-through pattern;Decreased stride length;Staggering right;Drifts right/left;Narrow base of support   Gait velocity interpretation: Below normal speed for age/gender General Gait Details: Min assist for balance while ambulating without an assistive device (hand held assist). Cues for forward gaze and to widen base of support. Became mildly dyspneic but unable to get SpO2 reading and resolved after sitting. Trial of ambulating with rolling walker, required close guard for safety. Without RW pt demonstrates Rt drift and occasional stagger to Rt requiring assist to correct. Educated on safe DME use with rolling walker.  Stairs            Wheelchair Mobility    Modified Rankin (Stroke Patients Only) Modified Rankin (Stroke Patients Only) Pre-Morbid Rankin Score: Slight disability Modified Rankin: Moderately severe disability     Balance Overall balance assessment: Needs assistance Sitting-balance support: No upper extremity supported;Feet supported Sitting balance-Leahy Scale: Fair     Standing balance support: Single extremity supported Standing balance-Leahy Scale: Poor                               Pertinent Vitals/Pain Pain Assessment: No/denies pain  HR 80s at rest     [place] expects to be discharged to:: Inpatient rehab Living Arrangements: Alone Available Help at Discharge: Neighbor;Available PRN/intermittently Type of Home: House Home Access: Stairs to enter Entrance Stairs-Rails: Right;Left Entrance Stairs-Number of Steps: 3 Home Layout: Two level;Able to live on main level with bedroom/bathroom;Bed/bath upstairs (bath upstairs) Home Equipment: Other (comment);Bedside commode;Toilet riser (White cane)      Prior Function Level of Independence: Independent  with assistive device(s)         Comments: white cane for blindess     Hand Dominance   Dominant Hand: Right    Extremity/Trunk Assessment    Upper Extremity Assessment: Defer to OT evaluation           Lower Extremity Assessment: Overall WFL for tasks assessed         Communication   Communication: Expressive difficulties;HOH  Cognition Arousal/Alertness: Awake/alert Behavior During Therapy: WFL for tasks assessed/performed Overall Cognitive Status: Within Functional Limits for tasks assessed                      General Comments General comments (skin integrity, edema, etc.): Pts nephew was present during therapy evaluation and questions were answered, he is from out of town. Discussed d/c planning with patient.    Exercises        Assessment/Plan    PT Assessment Patient needs continued PT services  PT Diagnosis Difficulty walking;Abnormality of gait   PT Problem List Decreased activity tolerance;Decreased balance;Decreased mobility;Decreased knowledge of use of DME;Cardiopulmonary status limiting activity  PT Treatment Interventions DME instruction;Gait training;Functional mobility training;Therapeutic activities;Therapeutic exercise;Balance training;Neuromuscular re-education;Patient/family education   PT Goals (Current goals can be found in the Care Plan section) Acute Rehab PT Goals Patient Stated Goal: Be independent again PT Goal Formulation: With patient Time For Goal Achievement: 02/18/14 Potential to Achieve Goals: Fair    Frequency Min 4X/week   Barriers to discharge Decreased caregiver support Lives alone    Co-evaluation               End of Session Equipment Utilized During Treatment: Gait belt Activity Tolerance: Patient tolerated treatment well Patient left: in chair;with call bell/phone within reach;with family/visitor present;with chair alarm set Nurse Communication: Mobility status         Time: 4650-3546 PT Time Calculation (min) (ACUTE ONLY): 31 min   Charges:   PT Evaluation $Initial PT Evaluation Tier I: 1 Procedure PT Treatments $Gait Training: 8-22  mins   PT G CodesEllouise Newer 02/04/2014, 11:54 AM Elayne Snare, [place]

## 2014-02-04 NOTE — Progress Notes (Signed)
Speech Language Pathology Treatment: Dysphagia  Patient Details Name: Amanda Cochran MRN: [id number] DOB: 1928-10-30 Today's Date: 02/04/2014 Time: 1157-2620 SLP Time Calculation (min) (ACUTE ONLY): 25 min  Assessment / Plan / Recommendation Clinical Impression  Pt reports no need to tuck her chin with breakfast today - stating she does fine if she eats slowly and takes small boluses.  SLP explained clinical reasoning for chin tuck based on MBS yesterday.  Delays in oral transiting and oral stasis noted which SLP offered to obtain applesauce to help pt clear. Pt did not answer SLP question but demonstrated continued efforts to clear.    No s/s of aspiration nor complaint of pharyngeal stasis reported by pt - however sensory impairment noted on MBS.  Encouraged pt to expectorate at end of meal to assure pharynx is clear as able.  Pt stated she has "been on this earth a long time and will do what [she] wants".  Encouraged pt to continue precautions for airway protection.     Gerald Stabs, nephew, present and SLP explained clinical reasoning for swallow strategies as he reports he helped pt to eat breakfast.     Recommend continue diet with strict precautions and SLP follow up for dysphagia/dysarthria management.       HPI HPI: 80 y.o. female essential hypertension, and previous strokes currently on aspirin that comes in for sudden onset of slurred speech. She laid she woke up like at 2 AM and when she looked in mirror she had a facial droop. She called her neighbor and her neighbor cannot understand her. She denies any chest pain, shortness of breath no change in her medications.  CT head negative, pt with old basal ganglia and thalamic cva.  Swallow evaluation ordered.    Pertinent Vitals Pain Assessment: No/denies pain  SLP Plan  Continue with current plan of care    Recommendations Diet recommendations: Dysphagia 3 (mechanical soft);Nectar-thick liquid Liquids provided via: Straw (ok with chin  tuck) Medication Administration: Crushed with puree Supervision: Patient able to self feed;Full supervision/cueing for compensatory strategies Compensations: Slow rate;Small sips/bites;Clear throat intermittently;Effortful swallow;Multiple dry swallows after each bite/sip Postural Changes and/or Swallow Maneuvers: Seated upright 90 degrees;Upright 30-60 min after meal;Chin tuck (chin tuck with all  boluses)              Oral Care Recommendations: Oral care BID Follow up Recommendations: Inpatient Rehab Plan: Continue with current plan of care    [place], [place] [facility] SLP [phone]

## 2014-02-05 NOTE — Progress Notes (Signed)
Pt ambulated with rolling walker standby assist x1 to use the bathroom. Gait steady. Will continue to monitor.

## 2014-02-05 NOTE — Progress Notes (Signed)
Rehab Admissions Coordinator Note:  Patient was screened by , L for appropriateness for an Inpatient Acute Rehab Consult.  At this time, we are recommending Inpatient Rehab consult.  , L 02/05/2014, 11:26 AM  I can be reached at [phone].

## 2014-02-05 NOTE — Progress Notes (Signed)
Physical Therapy Treatment Patient Details Name: Amanda Cochran MRN: [id number] DOB: 1929-02-16 Today's Date: 02/05/2014    History of Present Illness 80 yo female admitted due to slurred speech and SBP 270. MRI (+) L basal ganglia infarct. NIH= 4 PMH: glaucoma, HTN, arthritis, vaginal prolapse repair    PT Comments    Patient is highly motivated and progressing well with therapy. Continues to require Min A for gait especially through tight spaces. Patient would benefit from CIR to return to Mod I level and return home. Continue to recommend comprehensive inpatient rehab (CIR) for post-acute therapy needs.    Follow Up Recommendations  CIR     Equipment Recommendations  Rolling walker with 5" wheels    Recommendations for Other Services       Precautions / Restrictions Precautions Precautions: Fall Restrictions Weight Bearing Restrictions: No    Mobility  Bed Mobility Overal bed mobility: Needs Assistance Bed Mobility: Supine to Sit     Supine to sit: Supervision     General bed mobility comments: Supervision for safety  Transfers Overall transfer level: Needs assistance Equipment used: None Transfers: Sit to/from Stand Sit to Stand: Min guard         General transfer comment: Min guard (A) for balance and safety. Pt reaching for objects to hold onto immediately upon standing.  Ambulation/Gait Ambulation/Gait assistance: Min assist Ambulation Distance (Feet): 120 Feet Assistive device: 1 person hand held assist Gait Pattern/deviations: Step-through pattern;Decreased stride length   Gait velocity interpretation: Below normal speed for age/gender General Gait Details: Min A for balance with gait. Patient with lateral LOB x2 with min A to correct. Cues to increase step length and to manuver through tight spaces.    Stairs            Wheelchair Mobility    Modified Rankin (Stroke Patients Only) Modified Rankin (Stroke Patients Only) Pre-Morbid  Rankin Score: Slight disability Modified Rankin: Moderately severe disability     Balance Overall balance assessment: Needs assistance Sitting-balance support: No upper extremity supported;Feet supported Sitting balance-Leahy Scale: Fair     Standing balance support: No upper extremity supported;During functional activity Standing balance-Leahy Scale: Poor Standing balance comment: Pt with several small LOB while ambulating to and from bathroom without AD. Pt also reaching for objects (door handle, counter, grab bars).                    Cognition Arousal/Alertness: Awake/alert Behavior During Therapy: WFL for tasks assessed/performed Overall Cognitive Status: Within Functional Limits for tasks assessed                      Exercises      General Comments        Pertinent Vitals/Pain Pain Assessment: No/denies pain    Home Living Family/patient expects to be discharged to:: Private residence Living Arrangements: Alone Available Help at Discharge: Neighbor;Available PRN/intermittently Type of Home: House Home Access: Stairs to enter Entrance Stairs-Rails: Right;Left Home Layout: Two level;Able to live on main level with bedroom/bathroom;Bed/bath upstairs Home Equipment: Other (comment);Bedside commode;Toilet riser (White cane)      Prior Function Level of Independence: Independent      Comments: [facility] and received for a white cane about 2 weeks ago. Also received brail stickers for microwave. Pt has a small dog and reports that the dog is the same color as her hardwood floors and amy need (A) taking care of her.  PT Goals (current goals can now be found in the care plan section) Acute Rehab PT Goals Patient Stated Goal: Be independent again Progress towards PT goals: Progressing toward goals    Frequency  Min 4X/week    PT Plan Current plan remains appropriate    Co-evaluation             End of  Session Equipment Utilized During Treatment: Gait belt Activity Tolerance: Patient tolerated treatment well Patient left: in chair;with call bell/phone within reach;with family/visitor present;with chair alarm set     Time: 8250-5397 PT Time Calculation (min) (ACUTE ONLY): 24 min  Charges:  $Gait Training: 8-22 mins $Therapeutic Activity: 8-22 mins                    G Codes:      [name] 02/05/2014, 2:03 PM 02/05/2014 [name] PTA [phone] pager [phone] office

## 2014-02-05 NOTE — Progress Notes (Signed)
Pt eating breakfast at this time. Pt expressed concern stated that she was anxious because she lives alone and doesn't feel like she can care for herself.  Pt has been working with therapy and has a IP rehab screening ordered for possible admission.

## 2014-02-05 NOTE — Progress Notes (Signed)
[facility] PROGRESS NOTE  Amanda Cochran BPZ:[id number] DOB: 05-31-1928 DOA: 02/03/2014 PCP: Jerlyn Ly, MD  Assessment/Plan: 1. Acute L basal ganglia CVA 1. Neurology following 2. No carotid stenosis 3. 2d echo with EF 40%, no thrombus 4. PT/OT recs for CIR 5. CIR consulted - follow recs 2. HTN 1. BP stable 2. Follow 3. Hypothyroid 4. DVT prophylaxis 1. Heparin subQ  Code Status: Full Family Communication: Pt in room Disposition Plan: Pending   Consultants:  Neurology  Procedures:    Antibiotics:   (indicate start date, and stop date if known) HPI/Subjective: No acute events noted overnight  Objective: Filed Vitals:   02/04/14 2214 02/05/14 0115 02/05/14 0602 02/05/14 0933  BP: 160/77 153/63 171/70 155/63  Pulse: 73 70 69 70  Temp: 98.2 F (36.8 C) 98 F (36.7 C) 97.5 F (36.4 C) 97.8 F (36.6 C)  TempSrc: Oral Oral Axillary Oral  Resp: 20 20 20 18   SpO2: 97% 95% 97% 96%    Intake/Output Summary (Last 24 hours) at 02/05/14 1332 Last data filed at 02/05/14 0900  Gross per 24 hour  Intake    120 ml  Output      0 ml  Net    120 ml   There were no vitals filed for this visit.  Exam:   General:  Awake, in nad  Cardiovascular: regular,s1, s2  Respiratory: normal resp effort, no wheezing  Abdomen: soft, nondistended  Musculoskeletal: perfused, no clubbing   Data Reviewed: Basic Metabolic Panel:  Recent Labs Lab 02/03/14 0359 02/03/14 0407  NA 142 140  K 4.4 3.8  CL 105 106  CO2 23  --   GLUCOSE 119* 120*  BUN 19 20  CREATININE 0.86 0.90  CALCIUM 9.2  --    Liver Function Tests:  Recent Labs Lab 02/03/14 0359  AST 25  ALT 13  ALKPHOS 57  BILITOT 0.3  PROT 6.9  ALBUMIN 3.4*   No results for input(s): LIPASE, AMYLASE in the last 168 hours. No results for input(s): AMMONIA in the last 168 hours. CBC:  Recent Labs Lab 02/03/14 0359 02/03/14 0407  WBC 9.1  --   NEUTROABS 4.7  --   HGB 13.6 14.6  HCT 41.5  43.0  MCV 87.6  --   PLT 218  --    Cardiac Enzymes: No results for input(s): CKTOTAL, CKMB, CKMBINDEX, TROPONINI in the last 168 hours. BNP (last 3 results) No results for input(s): PROBNP in the last 8760 hours. CBG:  Recent Labs Lab 02/03/14 0424  GLUCAP 120*    No results found for this or any previous visit (from the past 240 hour(s)).   Studies: No results found.  Scheduled Meds: .  stroke: mapping our early stages of recovery book   Does not apply Once  . aspirin EC  81 mg Oral Daily  . benazepril  40 mg Oral Daily  . dipyridamole-aspirin  1 capsule Oral QPC supper  . dorzolamide-timolol  1 drop Both Eyes BID  . escitalopram  10 mg Oral QHS  . heparin  5,000 Units Subcutaneous 3 times per day  . latanoprost  1 drop Both Eyes QHS  . levothyroxine  100 mcg Oral QAC breakfast  . simvastatin  40 mg Oral q1800   Continuous Infusions:   Active Problems:   CVA (cerebral vascular accident)   Essential hypertension   Acute CVA (cerebrovascular accident)   Hyperlipidemia    Time spent: 41min    CHIU, STEPHEN K  [facility] Pager [phone]. If 7PM-7AM, please contact night-coverage at [email or web address], password [email or web address] 02/05/2014, 1:32 PM  LOS: 2 days

## 2014-02-05 NOTE — Progress Notes (Signed)
Speech Language Pathology Treatment: Dysphagia  Patient Details Name: Amanda Cochran MRN: [id number] DOB: 09-14-1928 Today's Date: 02/05/2014 Time: 8416-6063 SLP Time Calculation (min) (ACUTE ONLY): 17 min  Assessment / Plan / Recommendation Clinical Impression  Pt seen for f/u dysphagia treatment. SLP provided Min cues for recall of recommended strategies as well as utilization of them throughout PO intake consisting of nectar thick liquids. Pt did not demonstrate overt signs of aspiration across consistencies, and says that while her swallowing feels somewhat improved it is still taking her a "long time" to eat. Will continue to follow and advance as able.   HPI HPI: 80 yo female admitted due to slurred speech and SBP 270. MRI (+) L basal ganglia infarct. NIH= 4 PMH: glaucoma, HTN, arthritis, vaginal prolapse repair   Pertinent Vitals Pain Assessment: No/denies pain  SLP Plan  Continue with current plan of care    Recommendations Diet recommendations: Dysphagia 3 (mechanical soft);Nectar-thick liquid Medication Administration: Crushed with puree Supervision: Patient able to self feed;Full supervision/cueing for compensatory strategies Compensations: Slow rate;Small sips/bites;Clear throat intermittently;Effortful swallow;Multiple dry swallows after each bite/sip Postural Changes and/or Swallow Maneuvers: Seated upright 90 degrees;Upright 30-60 min after meal;Chin tuck              Oral Care Recommendations: Oral care BID Follow up Recommendations: Inpatient Rehab Plan: Continue with current plan of care    GO      Germain Osgood, M.A. CCC-SLP [phone]  Germain Osgood 02/05/2014, 3:19 PM

## 2014-02-05 NOTE — Evaluation (Signed)
Speech Language Pathology Evaluation Patient Details Name: Amanda Cochran MRN: [id number] DOB: 1928-12-25 Today's Date: 02/05/2014 Time: 2836-6294 SLP Time Calculation (min) (ACUTE ONLY): 32 min  Problem List:  Patient Active Problem List   Diagnosis Date Noted  . Hyperlipidemia   . CVA (cerebral vascular accident) 02/03/2014  . Essential hypertension 02/03/2014  . Acute CVA (cerebrovascular accident) 02/03/2014   Past Medical History:  Past Medical History  Diagnosis Date  . Thyroid disease   . Hyperlipidemia   . Hypertension   . Glaucoma   . Arthritis   . Bladder infection    Past Surgical History:  Past Surgical History  Procedure Laterality Date  . Thyroid surgery  1976  . Tonsillectomy and adenoidectomy    . Vaginal prolapse repair     HPI:  80 yo female admitted due to slurred speech and SBP 270. MRI (+) L basal ganglia infarct. NIH= 4 PMH: glaucoma, HTN, arthritis, vaginal prolapse repair   Assessment / Plan / Recommendation Clinical Impression  Pt has a mild dysarthria secondary to imprecise articulation from facial nerve involvement, reducing intelligibility at the conversational level. Her voice is hoarse, although patient reports that this is from a previous surgery as opposed to acute CVA. Pt appears to have appropriate sustained attention, basic problem solving, and emergent/anticipatory awareness, although she does have difficulty with storage of new information for which SLP provided Min cues for repetition to increase recall. Pt will benefit from skilled SLP services to maximize functional communication and independence.    SLP Assessment  Patient needs continued Speech Lanaguage Pathology Services    Follow Up Recommendations  Inpatient Rehab    Frequency and Duration min 2x/week  2 weeks   Pertinent Vitals/Pain Pain Assessment: No/denies pain   SLP Goals  Progression toward goals: Progressing toward goals Patient/Family Stated Goal: wants to get  home Potential to Achieve Goals (ACUTE ONLY): Good  SLP Evaluation Prior Functioning  Cognitive/Linguistic Baseline: Baseline deficits Baseline deficit details: pt reports mild memory trouble, refers to her "senior moments" Type of Home: House  Lives With: Alone Available Help at Discharge: Neighbor;Available PRN/intermittently Vocation: Retired   Associate Professor  Overall Cognitive Status: Impaired/Different from baseline Arousal/Alertness: Awake/alert Orientation Level: Oriented X4 Attention: Sustained Sustained Attention: Appears intact Memory: Impaired Memory Impairment: Storage deficit;Decreased recall of new information Awareness: Appears intact Problem Solving: Appears intact Safety/Judgment: Appears intact    Comprehension  Auditory Comprehension Overall Auditory Comprehension: Appears within functional limits for tasks assessed    Expression Expression Primary Mode of Expression: Verbal Verbal Expression Overall Verbal Expression: Appears within functional limits for tasks assessed Written Expression Dominant Hand: Right Written Expression: Not tested   Oral / Motor Oral Motor/Sensory Function Overall Oral Motor/Sensory Function: Impaired Labial ROM: Reduced right Labial Symmetry: Within Functional Limits Labial Strength: Reduced Labial Sensation: Reduced Lingual ROM: Reduced right Lingual Symmetry: Abnormal symmetry right Lingual Strength: Reduced Lingual Sensation: Reduced Facial ROM: Reduced right Facial Symmetry: Right droop Facial Strength: Reduced Facial Sensation: Reduced Mandible: Within Functional Limits Motor Speech Overall Motor Speech: Impaired Respiration: Within functional limits Phonation: Hoarse (reports this is from previous surgery) Resonance: Within functional limits Articulation: Impaired Level of Impairment: Conversation Intelligibility: Intelligibility reduced Conversation: 75-100% accurate Motor Planning: Witnin functional  limits Motor Speech Errors: Not applicable   GO      Amanda Cochran, M.A. CCC-SLP [phone]  Amanda Cochran 02/05/2014, 3:26 PM

## 2014-02-05 NOTE — Progress Notes (Signed)
Occupational Therapy Evaluation Patient Details Name: Amanda Cochran MRN: [id number] DOB: 03-Apr-1928 Today's Date: 02/05/2014    History of Present Illness 80 yo female admitted due to slurred speech and SBP 270. MRI (+) L basal ganglia infarct. NIH= 4 PMH: glaucoma, HTN, arthritis, vaginal prolapse repair   Clinical Impression   PT admitted with the above. Pt currently with functional limitiations due to the deficits listed below (see OT problem list). PTA, pt began using white cane for low vision about 2 weeks ago. Pt reaching for objects to hold onto and had several small LOB while ambulating without AD. Discussed home safety strategies for IADLs especially pet care. Pt will benefit from skilled OT to increase their independence and safety with adls and balance. Recommending CIR to maximize patient's independence and safety with ADLs, IADLs, and mobility.    Follow Up Recommendations  CIR    Equipment Recommendations  Other (comment) (Defer to next venue)    Recommendations for Other Services Rehab consult     Precautions / Restrictions Precautions Precautions: Fall Restrictions Weight Bearing Restrictions: No      Mobility Bed Mobility               General bed mobility comments: Pt in chair on OT arrival  Transfers Overall transfer level: Needs assistance Equipment used: None Transfers: Sit to/from Stand Sit to Stand: Min guard         General transfer comment: Min guard (A) for balance and safety. Pt reaching for objects to hold onto immediately upon standing.    Balance Overall balance assessment: Needs assistance Sitting-balance support: No upper extremity supported;Feet supported Sitting balance-Leahy Scale: Fair     Standing balance support: No upper extremity supported;During functional activity Standing balance-Leahy Scale: Poor Standing balance comment: Pt with several small LOB while ambulating to and from bathroom without AD. Pt also reaching  for objects (door handle, counter, grab bars).                            ADL Overall ADL's : Needs assistance/impaired     Grooming: Brushing hair;Min guard;Standing                   Toilet Transfer: Min guard;Ambulation;Regular Toilet;Grab bars Toilet Transfer Details (indicate cue type and reason): Min guard (A) for safety and balance. Toileting- Water quality scientist and Hygiene: Min guard;Sitting/lateral lean       Functional mobility during ADLs: Min guard General ADL Comments: Pt with several small LOB during session. Pt reaching for surfaces (door handle, counters, grab bars) to hold onto while ambulating to and from bathroom. Discussed environmental and safety precautions and modifications for home. Pt seemed aggreable to having HHOT come out and assess/help make adaptation fors pt's low vision.     Vision                 Additional Comments: Pt with low vision, formal visual assessments were unsucessful. Pt reports she is unable to see out of center part of her R eye and can only see with the outer parts of the eye. Pt also reports seeing shadows. "It's like someone has put shades over my eyes."   Perception     Praxis      Pertinent Vitals/Pain Pain Assessment: No/denies pain     Hand Dominance Right   Extremity/Trunk Assessment Upper Extremity Assessment Upper Extremity Assessment: Overall WFL for tasks assessed   Lower Extremity Assessment  Lower Extremity Assessment: Defer to PT evaluation   Cervical / Trunk Assessment Cervical / Trunk Assessment: Normal   Communication Communication Communication: HOH   Cognition Arousal/Alertness: Awake/alert Behavior During Therapy: WFL for tasks assessed/performed Overall Cognitive Status: Within Functional Limits for tasks assessed                     General Comments       Exercises       Shoulder Instructions      Home Living Family/patient expects to be discharged to::  Private residence Living Arrangements: Alone Available Help at Discharge: Neighbor;Available PRN/intermittently Type of Home: House Home Access: Stairs to enter [employer] of Steps: 3 Entrance Stairs-Rails: Right;Left Home Layout: Two level;Able to live on main level with bedroom/bathroom;Bed/bath upstairs Alternate Level Stairs-Number of Steps: 7 Alternate Level Stairs-Rails: Left     Bathroom Toilet: Handicapped height     Home Equipment: Other (comment);Bedside commode;Toilet riser (White cane)          Prior Functioning/Environment Level of Independence: Independent        Comments: [facility] and received for a white cane about 2 weeks ago. Also received brail stickers for microwave. Pt has a small dog and reports that the dog is the same color as her hardwood floors and amy need (A) taking care of her.    OT Diagnosis: Generalized weakness;Disturbance of vision   OT Problem List: Impaired balance (sitting and/or standing);Impaired vision/perception;Decreased activity tolerance;Decreased coordination;Decreased safety awareness;Decreased knowledge of use of DME or AE;Decreased knowledge of precautions;Decreased strength;Decreased range of motion   OT Treatment/Interventions: Self-care/ADL training;Therapeutic exercise;Energy conservation;DME and/or AE instruction;Therapeutic activities;Visual/perceptual remediation/compensation;Patient/family education;Balance training    OT Goals(Current goals can be found in the care plan section) Acute Rehab OT Goals Patient Stated Goal: Be independent again OT Goal Formulation: With patient Time For Goal Achievement: 02/19/14 Potential to Achieve Goals: Good ADL Goals Pt Will Perform Lower Body Dressing: with modified independence;sit to/from stand Pt Will Transfer to Toilet: with modified independence;ambulating;regular height toilet Pt Will Perform Tub/Shower Transfer: Tub  transfer;Shower transfer;ambulating;shower seat Additional ADL Goal #1: Pt will identify 2-3 strategies for home safety because of progressing low vision.  OT Frequency: Min 3X/week   Barriers to D/C:            Co-evaluation              End of Session Equipment Utilized During Treatment: Gait belt Nurse Communication: Mobility status;Precautions  Activity Tolerance: Patient limited by fatigue Patient left: in chair;with call bell/phone within reach;with chair alarm set   Time: 5929-2446 OT Time Calculation (min): 26 min Charges:    G-Codes:    Redmond Baseman 2014/02/17, 1:49 PM

## 2014-02-06 LAB — CBC
HCT: 41.8 % (ref 36.0–46.0)
HEMOGLOBIN: 13.7 g/dL (ref 12.0–15.0)
MCH: 28.8 pg (ref 26.0–34.0)
MCHC: 32.8 g/dL (ref 30.0–36.0)
MCV: 87.8 fL (ref 78.0–100.0)
Platelets: 258 10*3/uL (ref 150–400)
RBC: 4.76 MIL/uL (ref 3.87–5.11)
RDW: 14.6 % (ref 11.5–15.5)
WBC: 10.8 10*3/uL — ABNORMAL HIGH (ref 4.0–10.5)

## 2014-02-06 LAB — BASIC METABOLIC PANEL
Anion gap: 12 (ref 5–15)
BUN: 20 mg/dL (ref 6–23)
CO2: 25 mEq/L (ref 19–32)
CREATININE: 0.83 mg/dL (ref 0.50–1.10)
Calcium: 9.1 mg/dL (ref 8.4–10.5)
Chloride: 105 mEq/L (ref 96–112)
GFR calc non Af Amer: 63 mL/min — ABNORMAL LOW (ref >90)
GFR, EST AFRICAN AMERICAN: 74 mL/min — AB (ref >90)
GLUCOSE: 102 mg/dL — AB (ref 70–99)
Potassium: 4.2 mEq/L (ref 3.7–5.3)
Sodium: 142 mEq/L (ref 137–147)

## 2014-02-06 MED ORDER — ASPIRIN 81 MG PO TBEC: 81.0000 mg | DELAYED_RELEASE_TABLET | Freq: Every day | ORAL | Status: DC

## 2014-02-06 MED ORDER — POLYETHYLENE GLYCOL 3350 17 G PO PACK
17.0000 g | PACK | Freq: Every day | ORAL | Status: DC
  Administered 2014-02-07: 17 g via ORAL
  Filled 2014-02-06: qty 1

## 2014-02-06 MED ORDER — SENNOSIDES-DOCUSATE SODIUM 8.6-50 MG PO TABS
1.0000 | ORAL_TABLET | Freq: Two times a day (BID) | ORAL | Status: DC
  Administered 2014-02-06 – 2014-02-07 (×2): 1 via ORAL
  Filled 2014-02-06 (×2): qty 1

## 2014-02-06 MED ORDER — NAPHAZOLINE-PHENIRAMINE 0.025-0.3 % OP SOLN
2.0000 [drp] | Freq: Four times a day (QID) | OPHTHALMIC | Status: DC | PRN
  Filled 2014-02-06: qty 15

## 2014-02-06 MED ORDER — ASPIRIN-DIPYRIDAMOLE ER 25-200 MG PO CP12: 1.0000 | ORAL_CAPSULE | Freq: Every day | ORAL | Status: DC

## 2014-02-06 MED ORDER — NAPHAZOLINE HCL 0.1 % OP SOLN: 2.0000 [drp] | Freq: Four times a day (QID) | OPHTHALMIC | Status: DC | PRN

## 2014-02-06 NOTE — Progress Notes (Signed)
Physical Therapy Treatment Patient Details Name: Amanda Cochran MRN: [id number] DOB: 24-Nov-1928 Today's Date: 02/06/2014    History of Present Illness 80 yo female admitted due to slurred speech and SBP 270. MRI (+) L basal ganglia infarct. NIH= 4 PMH: glaucoma, HTN, arthritis, vaginal prolapse repair    PT Comments    Patient progressing well. ABle to complete BERG balance this session with increased time and rest breaks. Score 31/54 indicating increased fall risk. Continue to recommend comprehensive inpatient rehab (CIR) for post-acute therapy needs.   Follow Up Recommendations  Supervision/Assistance - 24 hour     Equipment Recommendations  Rolling walker with 5" wheels    Recommendations for Other Services       Precautions / Restrictions Precautions Precautions: Fall    Mobility  Bed Mobility   Bed Mobility: Supine to Sit;Sit to Supine     Supine to sit: Supervision Sit to supine: Supervision   General bed mobility comments: Supervision for safety. Cues for positioning due to rails  Transfers Overall transfer level: Needs assistance Equipment used: None   Sit to Stand: Min guard         General transfer comment: Min guard (A) for balance and safety. bracing of LEs on bed/chair  Ambulation/Gait Ambulation/Gait assistance: Min assist Ambulation Distance (Feet): 140 Feet Assistive device: None Gait Pattern/deviations: Step-through pattern;Decreased stride length   Gait velocity interpretation: Below normal speed for age/gender General Gait Details: Min A for balance with gait. Patient with increased L sway today   Stairs            Wheelchair Mobility    Modified Rankin (Stroke Patients Only) Modified Rankin (Stroke Patients Only) Pre-Morbid Rankin Score: Slight disability Modified Rankin: Moderately severe disability     Balance     Sitting balance-Leahy Scale: Fair       Standing balance-Leahy Scale: Poor                       Cognition Arousal/Alertness: Awake/alert Behavior During Therapy: WFL for tasks assessed/performed Overall Cognitive Status: Within Functional Limits for tasks assessed                      Exercises      General Comments        Pertinent Vitals/Pain Pain Assessment: No/denies pain    Home Living                      Prior Function            PT Goals (current goals can now be found in the care plan section) Progress towards PT goals: Progressing toward goals    Frequency  Min 4X/week    PT Plan Current plan remains appropriate    Co-evaluation             End of Session Equipment Utilized During Treatment: Gait belt Activity Tolerance: Patient tolerated treatment well Patient left: in chair;with call bell/phone within reach     Time: 3818-2993 PT Time Calculation (min) (ACUTE ONLY): 24 min  Charges:  $Gait Training: 8-22 mins $Therapeutic Activity: 8-22 mins                    G Codes:      [name] 02/06/2014, 2:48 PM  02/06/2014 [name] PTA [phone] pager [phone] office

## 2014-02-06 NOTE — Consult Note (Signed)
Physical Medicine and Rehabilitation Consult Reason for Consult: CVA Referring Physician: [facility]   HPI: Amanda Cochran is a 80 y.o. right handed female with history of hypertension and previous strokes with little residual weakness maintained on aspirin 325 mg daily and severe glaucoma with limited vision. Lives alone and uses a cane. He presented 02/03/2014 with slurred speech and right facial droop. MRI showed acute left basal ganglia infarct as well as moderate chronic small vessel ischemic disease. MRA with no evidence of major intracranial occlusion or stenosis. Patient did not receive TPA. Echocardiogram with ejection fraction of 40% without embolism. Carotid Doppler no ICA stenosis. Neurology services consulted presently on aspirin 81 mg daily as well as Aggrenox. Dysphagia 3 active thick liquid diet. Subcutaneous heparin has been added for DVT prophylaxis. Physical therapy evaluation completed 02/04/2014 with recommendations of physical medicine rehabilitation consult.   Review of Systems  Eyes:       Poor vision related to glaucoma  Musculoskeletal: Positive for myalgias and joint pain.  Neurological: Positive for dizziness.  All other systems reviewed and are negative.  Past Medical History  Diagnosis Date  . Thyroid disease   . Hyperlipidemia   . Hypertension   . Glaucoma   . Arthritis   . Bladder infection    Past Surgical History  Procedure Laterality Date  . Thyroid surgery  1976  . Tonsillectomy and adenoidectomy    . Vaginal prolapse repair     History reviewed. No pertinent family history. Social History:  reports that she has never smoked. She does not have any smokeless tobacco history on file. She reports that she does not drink alcohol or use illicit drugs. Allergies: No Known Allergies Medications Prior to Admission  Medication Sig Dispense Refill  . aspirin 325 MG tablet Take 325 mg by mouth at bedtime.    . benazepril (LOTENSIN) 40 MG tablet Take 40  mg by mouth daily.    . Calcium Carbonate (CALTRATE 600 PO) Take 1 tablet by mouth daily.     . Cholecalciferol (VITAMIN D) 2000 UNITS CAPS Take 1 capsule by mouth daily.    . dorzolamide-timolol (COSOPT) 22.3-6.8 MG/ML ophthalmic solution Place 1 drop into both eyes 2 (two) times daily.    . Fish Oil-Cholecalciferol (FISH OIL + D3) 1200-1000 MG-UNIT CAPS Take 1 tablet by mouth daily.     Marland Kitchen latanoprost (XALATAN) 0.005 % ophthalmic solution Place 1 drop into both eyes at bedtime.     Marland Kitchen levothyroxine (SYNTHROID, LEVOTHROID) 100 MCG tablet Take 100 mcg by mouth daily.    . meclizine (ANTIVERT) 25 MG tablet Take 1 tablet (25 mg total) by mouth 4 (four) times daily. (Patient taking differently: Take 25 mg by mouth 3 (three) times daily as needed for dizziness. ) 28 tablet 0  . Multiple Vitamins-Minerals (CENTRUM SILVER PO) Take 1 tablet by mouth daily.    . ondansetron (ZOFRAN ODT) 4 MG disintegrating tablet Take 1 tablet (4 mg total) by mouth every 8 (eight) hours as needed for nausea or vomiting. 20 tablet 0  . simvastatin (ZOCOR) 40 MG tablet Take 40 mg by mouth every evening.    . brinzolamide (AZOPT) 1 % ophthalmic suspension Place 1 drop into both eyes 2 (two) times daily.     Marland Kitchen escitalopram (LEXAPRO) 10 MG tablet Take 10 mg by mouth at bedtime.      Home: Home Living Family/patient expects to be discharged to:: Private residence Living Arrangements: Alone Available Help at Discharge:  Neighbor, Available PRN/intermittently Type of Home: House Home Access: Stairs to enter [employer] of Steps: 3 Entrance Stairs-Rails: Right, Left Home Layout: Two level, Able to live on main level with bedroom/bathroom, Bed/bath upstairs Alternate Level Stairs-Number of Steps: 7 Alternate Level Stairs-Rails: Left Home Equipment: Other (comment), Bedside commode, Toilet riser (White cane)  Lives With: Alone  Functional History: Prior Function Level of Independence: Independent Comments: Pt  [facility] and received for a white cane about 2 weeks ago. Also received brail stickers for microwave. Pt has a small dog and reports that the dog is the same color as her hardwood floors and amy need (A) taking care of her. Functional Status:  Mobility: Bed Mobility Overal bed mobility: Needs Assistance Bed Mobility: Supine to Sit Supine to sit: Supervision General bed mobility comments: Supervision for safety Transfers Overall transfer level: Needs assistance Equipment used: None Transfers: Sit to/from Stand Sit to Stand: Min guard General transfer comment: Min guard (A) for balance and safety. Pt reaching for objects to hold onto immediately upon standing. Ambulation/Gait Ambulation/Gait assistance: Min assist Ambulation Distance (Feet): 120 Feet Assistive device: 1 person hand held assist Gait Pattern/deviations: Step-through pattern, Decreased stride length Gait velocity interpretation: Below normal speed for age/gender General Gait Details: Min A for balance with gait. Patient with lateral LOB x2 with min A to correct. Cues to increase step length and to manuver through tight spaces.     ADL: ADL Overall ADL's : Needs assistance/impaired Grooming: Brushing hair, Min guard, Standing Toilet Transfer: Min guard, Ambulation, Regular Toilet, Grab bars Toilet Transfer Details (indicate cue type and reason): Min guard (A) for safety and balance. Toileting- Water quality scientist and Hygiene: Min guard, Sitting/lateral lean Functional mobility during ADLs: Min guard General ADL Comments: Pt with several small LOB during session. Pt reaching for surfaces (door handle, counters, grab bars) to hold onto while ambulating to and from bathroom. Discussed environmental and safety precautions and modifications for home. Pt seemed aggreable to having HHOT come out and assess/help make adaptation fors pt's low vision.  Cognition: Cognition Overall Cognitive  Status: Impaired/Different from baseline Arousal/Alertness: Awake/alert Orientation Level: Oriented X4 Attention: Sustained Sustained Attention: Appears intact Memory: Impaired Memory Impairment: Storage deficit, Decreased recall of new information Awareness: Appears intact Problem Solving: Appears intact Safety/Judgment: Appears intact Cognition Arousal/Alertness: Awake/alert Behavior During Therapy: WFL for tasks assessed/performed Overall Cognitive Status: Impaired/Different from baseline  Blood pressure 165/62, pulse 66, temperature 97.8 F (36.6 C), temperature source Oral, resp. rate 16, SpO2 98 %. Physical Exam  Constitutional: She is oriented to person, place, and time.  HENT:  Head: Normocephalic.  Eyes:  Pupils sluggish but reactive to light  Neck: Normal range of motion. Neck supple. No thyromegaly present.  Cardiovascular: Normal rate and regular rhythm.   Respiratory: Effort normal and breath sounds normal. No respiratory distress.  GI: Soft. Bowel sounds are normal. She exhibits no distension.  Neurological: She is alert and oriented to person, place, and time.  Speech is a bit dysarthric but fully intelligible. She follows commands. Right facial droop. Visual acuity poor. Sensation 1 to 1+/2 right face, arm, leg. RUE 4/5. RLE 4/5 prox to distal. Fair insight and awareness.   Skin: Skin is warm and dry.  Psychiatric:  Anxious,    No results found for this or any previous visit (from the past 24 hour(s)). No results found.  Assessment/Plan: Diagnosis: left basal ganglia infarct 1. Does the need for close, 24 hr/day medical  supervision in concert with the patient's rehab needs make it unreasonable for this patient to be served in a less intensive setting? Yes 2. Co-Morbidities requiring supervision/potential complications: essential htn, anxiety, reactive depression 3. Due to bladder management, bowel management, safety, skin/wound care, disease management,  medication administration, pain management and patient education, does the patient require 24 hr/day rehab nursing? Yes 4. Does the patient require coordinated care of a physician, rehab nurse, PT (1-2 hrs/day, 5 days/week), OT (1-2 hrs/day, 5 days/week) and SLP (1-2 hrs/day, 5 days/week) to address physical and functional deficits in the context of the above medical diagnosis(es)? Yes Addressing deficits in the following areas: balance, endurance, locomotion, strength, transferring, bowel/bladder control, bathing, dressing, feeding, grooming, toileting, speech and psychosocial support 5. Can the patient actively participate in an intensive therapy program of at least 3 hrs of therapy per day at least 5 days per week? Yes 6. The potential for patient to make measurable gains while on inpatient rehab is excellent 7. Anticipated functional outcomes upon discharge from inpatient rehab are modified independent  with PT, modified independent with OT, modified independent with SLP. 8. Estimated rehab length of stay to reach the above functional goals is: 7-8 9. Does the patient have adequate social supports and living environment to accommodate these discharge functional goals? Yes, potentially 10. Anticipated D/C setting: Home 11. Anticipated post D/C treatments: [place] therapy 12. Overall Rehab/Functional Prognosis: excellent  RECOMMENDATIONS: This patient's condition is appropriate for continued rehabilitative care in the following setting: CIR Patient has agreed to participate in recommended program. Yes Note that insurance prior authorization may be required for reimbursement for recommended care.  Comment: Rehab Admissions Coordinator to follow up.  Thanks,  Meredith Staggers, MD, Mellody Drown     02/06/2014

## 2014-02-06 NOTE — Progress Notes (Signed)
[facility] PROGRESS NOTE  Amanda Cochran ZDG:[id number] DOB: [birth year] DOA: 02/03/2014 PCP: Jerlyn Ly, MD  Assessment/Plan: 1. Acute L basal ganglia CVA 1. Neurology following 2. No carotid stenosis 3. 2d echo with EF 40%, no thrombus 4. Neuro recs for 81mg  ASA with 1 tab aggrenox x 2 weeks, then transition to BID aggrenox if pt able to tolerate 5. PT/OT recs for CIR 6. CIR consulted - pending 2. HTN 1. BP stable 2. Follow 3. Hypothyroid 4. DVT prophylaxis 1. Heparin subQ  Code Status: Full Family Communication: Pt in room Disposition Plan: Pending   Consultants:  Neurology  Procedures:    Antibiotics:   (indicate start date, and stop date if known) HPI/Subjective: No acute events noted overnight  Objective: Filed Vitals:   02/05/14 2126 02/06/14 0134 02/06/14 0654 02/06/14 1022  BP: 184/56 165/62 188/66 151/84  Pulse: 75 66 68 84  Temp: 98.2 F (36.8 C) 97.8 F (36.6 C) 97.7 F (36.5 C) 97.4 F (36.3 C)  TempSrc: Oral Oral Oral Oral  Resp: 18 16 18 18   SpO2: 96% 98% 97% 92%    Intake/Output Summary (Last 24 hours) at 02/06/14 1222 Last data filed at 02/06/14 0900  Gross per 24 hour  Intake    240 ml  Output      0 ml  Net    240 ml   There were no vitals filed for this visit.  Exam:   General:  Awake, in nad  Cardiovascular: regular,s1, s2  Respiratory: normal resp effort, no wheezing  Abdomen: soft, nondistended  Musculoskeletal: perfused, no clubbing   Data Reviewed: Basic Metabolic Panel:  Recent Labs Lab 02/03/14 0359 02/03/14 0407  NA 142 140  K 4.4 3.8  CL 105 106  CO2 23  --   GLUCOSE 119* 120*  BUN 19 20  CREATININE 0.86 0.90  CALCIUM 9.2  --    Liver Function Tests:  Recent Labs Lab 02/03/14 0359  AST 25  ALT 13  ALKPHOS 57  BILITOT 0.3  PROT 6.9  ALBUMIN 3.4*   No results for input(s): LIPASE, AMYLASE in the last 168 hours. No results for input(s): AMMONIA in the last 168  hours. CBC:  Recent Labs Lab 02/03/14 0359 02/03/14 0407 02/06/14 1144  WBC 9.1  --  10.8*  NEUTROABS 4.7  --   --   HGB 13.6 14.6 13.7  HCT 41.5 43.0 41.8  MCV 87.6  --  87.8  PLT 218  --  258   Cardiac Enzymes: No results for input(s): CKTOTAL, CKMB, CKMBINDEX, TROPONINI in the last 168 hours. BNP (last 3 results) No results for input(s): PROBNP in the last 8760 hours. CBG:  Recent Labs Lab 02/03/14 0424  GLUCAP 120*    No results found for this or any previous visit (from the past 240 hour(s)).   Studies: No results found.  Scheduled Meds: .  stroke: mapping our early stages of recovery book   Does not apply Once  . aspirin EC  81 mg Oral Daily  . benazepril  40 mg Oral Daily  . dipyridamole-aspirin  1 capsule Oral QPC supper  . dorzolamide-timolol  1 drop Both Eyes BID  . escitalopram  10 mg Oral QHS  . heparin  5,000 Units Subcutaneous 3 times per day  . latanoprost  1 drop Both Eyes QHS  . levothyroxine  100 mcg Oral QAC breakfast  . simvastatin  40 mg Oral q1800   Continuous Infusions:   Active Problems:  CVA (cerebral vascular accident)   Essential hypertension   Acute CVA (cerebrovascular accident)   Hyperlipidemia    Time spent: 32min    , [place] [facility] Pager [phone]. If 7PM-7AM, please contact night-coverage at [email or web address], password [facility] 02/06/2014, 12:22 PM  LOS: 3 days

## 2014-02-06 NOTE — Progress Notes (Signed)
Rehab admissions - Evaluated for possible admission.  I met with patient and gave her rehab brochures.  Patient would like inpatient rehab here at [facility]  Currently rehab beds are full.  I will check back in am for bed availability.  Call me for questions.  #[phone]

## 2014-02-07 ENCOUNTER — Inpatient Hospital Stay (HOSPITAL_COMMUNITY)
Admission: RE | Admit: 2014-02-07 | Discharge: 2014-02-19 | DRG: 057 | Disposition: A | Discharge status: Home Health | Payer: Medicare Other | Source: Intra-hospital | Attending: Physical Medicine & Rehabilitation | Admitting: Physical Medicine & Rehabilitation

## 2014-02-07 DIAGNOSIS — E785 Hyperlipidemia, unspecified: Secondary | ICD-10-CM | POA: Diagnosis present

## 2014-02-07 DIAGNOSIS — I69391 Dysphagia following cerebral infarction: Secondary | ICD-10-CM | POA: Diagnosis not present

## 2014-02-07 DIAGNOSIS — I639 Cerebral infarction, unspecified: Secondary | ICD-10-CM | POA: Diagnosis present

## 2014-02-07 DIAGNOSIS — I6381 Other cerebral infarction due to occlusion or stenosis of small artery: Secondary | ICD-10-CM | POA: Diagnosis present

## 2014-02-07 DIAGNOSIS — I6330 Cerebral infarction due to thrombosis of unspecified cerebral artery: Secondary | ICD-10-CM | POA: Diagnosis not present

## 2014-02-07 DIAGNOSIS — F329 Major depressive disorder, single episode, unspecified: Secondary | ICD-10-CM | POA: Diagnosis present

## 2014-02-07 DIAGNOSIS — E039 Hypothyroidism, unspecified: Secondary | ICD-10-CM | POA: Diagnosis present

## 2014-02-07 DIAGNOSIS — I63011 Cerebral infarction due to thrombosis of right vertebral artery: Secondary | ICD-10-CM | POA: Diagnosis not present

## 2014-02-07 DIAGNOSIS — I69392 Facial weakness following cerebral infarction: Secondary | ICD-10-CM | POA: Diagnosis not present

## 2014-02-07 DIAGNOSIS — I69322 Dysarthria following cerebral infarction: Principal | ICD-10-CM

## 2014-02-07 DIAGNOSIS — M25551 Pain in right hip: Secondary | ICD-10-CM | POA: Diagnosis not present

## 2014-02-07 DIAGNOSIS — I6340 Cerebral infarction due to embolism of unspecified cerebral artery: Secondary | ICD-10-CM | POA: Diagnosis not present

## 2014-02-07 DIAGNOSIS — I10 Essential (primary) hypertension: Secondary | ICD-10-CM | POA: Diagnosis present

## 2014-02-07 DIAGNOSIS — H4050X0 Glaucoma secondary to other eye disorders, unspecified eye, stage unspecified: Secondary | ICD-10-CM | POA: Diagnosis not present

## 2014-02-07 DIAGNOSIS — G8190 Hemiplegia, unspecified affecting unspecified side: Secondary | ICD-10-CM | POA: Diagnosis not present

## 2014-02-07 DIAGNOSIS — R1310 Dysphagia, unspecified: Secondary | ICD-10-CM | POA: Diagnosis present

## 2014-02-07 DIAGNOSIS — H409 Unspecified glaucoma: Secondary | ICD-10-CM | POA: Diagnosis present

## 2014-02-07 DIAGNOSIS — G8191 Hemiplegia, unspecified affecting right dominant side: Secondary | ICD-10-CM

## 2014-02-07 HISTORY — DX: Cerebral infarction, unspecified: I63.9

## 2014-02-07 LAB — URINE MICROSCOPIC-ADD ON

## 2014-02-07 LAB — CBC
HCT: 45 % (ref 36.0–46.0)
Hemoglobin: 15.1 g/dL — ABNORMAL HIGH (ref 12.0–15.0)
MCH: 29.8 pg (ref 26.0–34.0)
MCHC: 33.6 g/dL (ref 30.0–36.0)
MCV: 88.9 fL (ref 78.0–100.0)
Platelets: 272 10*3/uL (ref 150–400)
RBC: 5.06 MIL/uL (ref 3.87–5.11)
RDW: 14.2 % (ref 11.5–15.5)
WBC: 13.4 10*3/uL — ABNORMAL HIGH (ref 4.0–10.5)

## 2014-02-07 LAB — URINALYSIS, ROUTINE W REFLEX MICROSCOPIC
Bilirubin Urine: NEGATIVE
Glucose, UA: NEGATIVE mg/dL
HGB URINE DIPSTICK: NEGATIVE
KETONES UR: NEGATIVE mg/dL
NITRITE: NEGATIVE
Protein, ur: NEGATIVE mg/dL
Specific Gravity, Urine: 1.017 (ref 1.005–1.030)
Urobilinogen, UA: 0.2 mg/dL (ref 0.0–1.0)
pH: 6.5 (ref 5.0–8.0)

## 2014-02-07 LAB — CREATININE, SERUM
CREATININE: 0.77 mg/dL (ref 0.50–1.10)
GFR calc Af Amer: 88 mL/min — ABNORMAL LOW (ref >90)
GFR, EST NON AFRICAN AMERICAN: 76 mL/min — AB (ref >90)

## 2014-02-07 MED ORDER — ASPIRIN EC 81 MG PO TBEC
81.0000 mg | DELAYED_RELEASE_TABLET | Freq: Every day | ORAL | Status: DC
  Administered 2014-02-08 – 2014-02-18 (×11): 81 mg via ORAL
  Filled 2014-02-07 (×14): qty 1

## 2014-02-07 MED ORDER — TRAZODONE HCL 50 MG PO TABS
50.0000 mg | ORAL_TABLET | Freq: Every evening | ORAL | Status: DC | PRN
  Administered 2014-02-07: 50 mg via ORAL
  Administered 2014-02-08 – 2014-02-09 (×2): 25 mg via ORAL
  Administered 2014-02-11: 50 mg via ORAL
  Filled 2014-02-07 (×4): qty 1

## 2014-02-07 MED ORDER — SIMVASTATIN 40 MG PO TABS
40.0000 mg | ORAL_TABLET | Freq: Every day | ORAL | Status: DC
  Administered 2014-02-07 – 2014-02-10 (×4): 40 mg via ORAL
  Filled 2014-02-07 (×5): qty 1

## 2014-02-07 MED ORDER — NAPHAZOLINE-PHENIRAMINE 0.025-0.3 % OP SOLN
2.0000 [drp] | Freq: Four times a day (QID) | OPHTHALMIC | Status: DC | PRN
  Filled 2014-02-07: qty 15

## 2014-02-07 MED ORDER — SENNOSIDES-DOCUSATE SODIUM 8.6-50 MG PO TABS
1.0000 | ORAL_TABLET | Freq: Two times a day (BID) | ORAL | Status: DC
  Administered 2014-02-07 – 2014-02-18 (×19): 1 via ORAL
  Filled 2014-02-07 (×27): qty 1

## 2014-02-07 MED ORDER — ONDANSETRON HCL 4 MG/2ML IJ SOLN: 4.0000 mg | Freq: Four times a day (QID) | INTRAMUSCULAR | Status: DC | PRN

## 2014-02-07 MED ORDER — LATANOPROST 0.005 % OP SOLN
1.0000 [drp] | Freq: Every day | OPHTHALMIC | Status: DC
  Administered 2014-02-07 – 2014-02-18 (×12): 1 [drp] via OPHTHALMIC
  Filled 2014-02-07 (×2): qty 2.5

## 2014-02-07 MED ORDER — DORZOLAMIDE HCL-TIMOLOL MAL 2-0.5 % OP SOLN
1.0000 [drp] | Freq: Two times a day (BID) | OPHTHALMIC | Status: DC
  Administered 2014-02-07 – 2014-02-19 (×24): 1 [drp] via OPHTHALMIC
  Filled 2014-02-07: qty 10

## 2014-02-07 MED ORDER — ESCITALOPRAM OXALATE 10 MG PO TABS
10.0000 mg | ORAL_TABLET | Freq: Every day | ORAL | Status: DC
  Administered 2014-02-07 – 2014-02-09 (×3): 10 mg via ORAL
  Filled 2014-02-07 (×5): qty 1

## 2014-02-07 MED ORDER — ACETAMINOPHEN 325 MG PO TABS
650.0000 mg | ORAL_TABLET | Freq: Four times a day (QID) | ORAL | Status: DC | PRN
  Administered 2014-02-11 – 2014-02-15 (×4): 650 mg via ORAL
  Filled 2014-02-07 (×4): qty 2

## 2014-02-07 MED ORDER — ASPIRIN-DIPYRIDAMOLE ER 25-200 MG PO CP12
1.0000 | ORAL_CAPSULE | Freq: Every day | ORAL | Status: DC
  Administered 2014-02-07 – 2014-02-18 (×12): 1 via ORAL
  Filled 2014-02-07 (×13): qty 1

## 2014-02-07 MED ORDER — HEPARIN SODIUM (PORCINE) 5000 UNIT/ML IJ SOLN: 5000.0000 [IU] | Freq: Three times a day (TID) | INTRAMUSCULAR | Status: DC

## 2014-02-07 MED ORDER — LEVOTHYROXINE SODIUM 100 MCG PO TABS
100.0000 ug | ORAL_TABLET | Freq: Every day | ORAL | Status: DC
  Administered 2014-02-08 – 2014-02-19 (×12): 100 ug via ORAL
  Filled 2014-02-07 (×14): qty 1

## 2014-02-07 MED ORDER — HEPARIN SODIUM (PORCINE) 5000 UNIT/ML IJ SOLN
5000.0000 [IU] | Freq: Three times a day (TID) | INTRAMUSCULAR | Status: DC
  Administered 2014-02-07 – 2014-02-19 (×34): 5000 [IU] via SUBCUTANEOUS
  Filled 2014-02-07 (×36): qty 1

## 2014-02-07 MED ORDER — ONDANSETRON HCL 4 MG PO TABS: 4.0000 mg | ORAL_TABLET | Freq: Four times a day (QID) | ORAL | Status: DC | PRN

## 2014-02-07 MED ORDER — SORBITOL 70 % SOLN: 30.0000 mL | Freq: Every day | Status: DC | PRN

## 2014-02-07 MED ORDER — BENAZEPRIL HCL 40 MG PO TABS
40.0000 mg | ORAL_TABLET | Freq: Every day | ORAL | Status: DC
  Administered 2014-02-08 – 2014-02-19 (×12): 40 mg via ORAL
  Filled 2014-02-07 (×14): qty 1

## 2014-02-07 MED ORDER — POLYETHYLENE GLYCOL 3350 17 G PO PACK
17.0000 g | PACK | Freq: Every day | ORAL | Status: DC
  Administered 2014-02-08 – 2014-02-13 (×3): 17 g via ORAL
  Filled 2014-02-07 (×13): qty 1

## 2014-02-07 NOTE — Discharge Summary (Addendum)
[facility]  Physician Discharge Summary   Patient ID: Cochran Amanda MRN: [id number] DOB/AGE: [birth year] 80 y.o.  Admit date: 02/03/2014 Discharge date: 02/07/2014  PCP: Jerlyn Ly, MD  DISCHARGE DIAGNOSES:  Active Problems:   CVA (cerebral vascular accident)   Essential hypertension   Acute CVA (cerebrovascular accident)   Hyperlipidemia   RECOMMENDATIONS FOR OUTPATIENT FOLLOW UP: 1. See recommendation regarding Aggrenox. 2. Patient going to CIR 3. Needs follow up with PCP or cardiology for Low EF and non specific changes on EKG.  DISCHARGE CONDITION: fair  Diet recommendation: Dys 3 with Nectar Thick liquids   INITIAL HISTORY: Amanda Cochran is a 80 y.o. Female with PMH of essential hypertension, and previous strokes currently on aspirin who came in for sudden onset of slurred speech. She was admitted for stroke work up.  Consultations:  Neurology  Procedures: Carotid Doppler 1-39% ICA stenosis. Vertebral artery flow is antegrade.   2 D ECHO Report "Study Conclusions - Left ventricle: Technically very limited study. There ishypokinesis at the base inferior and base inferolateral segments.The anterior septum moves. I can not see any of the other walls.I can not estimate the EF. I can guess 40%. The cavity size wasnormal. Wall thickness was increased in a pattern of mild LVH. - Left atrium: The atrium was mildly dilated. - Impressions: No cardiac source of embolism was identified, butcannot be ruled out on the basis of this examination. Impressions: - No cardiac source of embolism was identified, but cannot be ruledout on the basis of this examination."  HOSPITAL COURSE:   Acute L basal ganglia CVA Patient was seen by neurology and underwent stroke workup. LDL was 86. She is on a statin. Antiplatelet was discussed by the neurologist with the patient. Apparently, she has not tolerated Plavix in the past. So Aggrenox has been initiated. She should  take Aggrenox once a day along with 81 mg of aspirin for 2 weeks. And if at that point she is tolerating the Aggrenox well she should be transitioned to Aggrenox twice daily and aspirin can be discontinued. She can take Tylenol for headaches that sometimes occurs with taking Aggrenox. She was seen by physical and occupational therapy who recommended inpatient rehabilitation. She was seen by the rehabilitation M.D. and has been accepted to rehabilitation today. This morning patient did complain of some numbness in the left side of her face. She is feeling very anxious and upset due to her stroke. However, she does not have any new deficits on examination. At this point, we would recommend continued observation. No further changes in treatment plan. She can go to inpatient rehabilitation.  Low ejection fraction with non specific EKG changes This was detected on echocardiogram done in the hospital. Wall motion abnormality could not be ruled out. Patient does not have any chest discomfort. This will need to be pursued as an outpatient. There is no need to initiate diuretics at this time. Would recommend daily weights. Old records are reviewed and it appears that she had a cardiac catheterization in 2001, which showed clean coronaries. EF was normal at that time.  Essential HTN Blood pressure remains elevated. She has been placed back on her been as appropriate. Monitor blood pressures closely. There is no appropriate response in the next 48 hours. Dose may have to be increased or additional agent may be required.  Hypothyroidism Continue home medications.  History of glaucoma/legally blind. Continue with eyedrops  Patient is stable for transfer to inpatient rehabilitation.  PERTINENT LABS:  The  results of significant diagnostics from this hospitalization (including imaging, microbiology, ancillary and laboratory) are listed below for reference.     Labs: Basic Metabolic Panel:  Recent Labs Lab  02/03/14 0359 02/03/14 0407 02/06/14 1144  NA 142 140 142  K 4.4 3.8 4.2  CL 105 106 105  CO2 23  --  25  GLUCOSE 119* 120* 102*  BUN 19 20 20   CREATININE 0.86 0.90 0.83  CALCIUM 9.2  --  9.1   Liver Function Tests:  Recent Labs Lab 02/03/14 0359  AST 25  ALT 13  ALKPHOS 57  BILITOT 0.3  PROT 6.9  ALBUMIN 3.4*    CBC:  Recent Labs Lab 02/03/14 0359 02/03/14 0407 02/06/14 1144  WBC 9.1  --  10.8*  NEUTROABS 4.7  --   --   HGB 13.6 14.6 13.7  HCT 41.5 43.0 41.8  MCV 87.6  --  87.8  PLT 218  --  258   CBG:  Recent Labs Lab 02/03/14 0424  GLUCAP 120*     IMAGING STUDIES Ct Head (brain) Wo Contrast  02/03/2014   CLINICAL DATA:  Code stroke. Slurred speech and right-sided facial droop.  EXAM: CT HEAD WITHOUT CONTRAST  TECHNIQUE: Contiguous axial images were obtained from the base of the skull through the vertex without intravenous contrast.  COMPARISON:  MRI brain 04/05/2013.  CT head 04/05/2013.  FINDINGS: Ventricles and sulci appear symmetrical. Mild diffuse atrophy. Low-attenuation changes in the deep white matter consistent with small vessel ischemia. Old lacunar infarcts in the right basal ganglia and thalamus. No mass effect or midline shift. No abnormal extra-axial fluid collections. Gray-white matter junctions are distinct. Basal cisterns are not effaced. No evidence of acute intracranial hemorrhage. No depressed skull fractures. Mucosal thickening in the paranasal sinuses. Mastoid air cells are not opacified. Radiopaque densities demonstrated in what appears to be the oral cavity on the lowest slice. This may represent opaque medication in the patient's mouth.  IMPRESSION: No acute intracranial abnormalities. Chronic atrophy and small vessel ischemic changes.  These results were called by telephone at the time of interpretation on 02/03/2014 at 4:11 am to Dr. Leonel Ramsay, who verbally acknowledged these results.   Electronically Signed   By: Lucienne Capers  M.D.   On: 02/03/2014 04:14   Mr Brain Wo Contrast  02/03/2014   CLINICAL DATA:  Acute onset slurred speech and right facial droop this morning. Stroke.  EXAM: MRI HEAD WITHOUT CONTRAST  MRA HEAD WITHOUT CONTRAST  TECHNIQUE: Multiplanar, multiecho pulse sequences of the brain and surrounding structures were obtained without intravenous contrast. Angiographic images of the head were obtained using MRA technique without contrast.  COMPARISON:  02/03/2014 head CT and 04/05/2013 MRI  FINDINGS: MRI HEAD FINDINGS  There is a small, acute left lateral lenticulostriate territory infarct involving the body of the caudate, corona radiata, and posterior lentiform nucleus. Tiny foci of heterogeneously increased signal in the posterior midbrain near the colliculi on axial diffusion images are not confirmed on coronal images and felt to be artifactual.  There is no evidence of intracranial hemorrhage, mass, midline shift, or extra-axial fluid collection. Small, chronic cortical infarcts are again seen in the right temporal, right parietal, and right occipital lobes, unchanged. Patchy T2 hyperintensities elsewhere in the subcortical and deep cerebral white matter are unchanged from the prior MRI nonspecific but compatible with moderate chronic small vessel ischemic disease. There is mild cerebral atrophy, within normal limits for age. Chronic lacunar infarcts are again seen in the right  basal ganglia.  Prior bilateral cataract extraction is noted. There is mild right maxillary sinus mucosal thickening. No significant mastoid air cell fluid is seen. Major intracranial vascular flow voids are preserved.  MRA HEAD FINDINGS  Images are mildly degraded by motion artifact.  Visualized distal vertebral arteries are patent with the left being mildly dominant. PICA origins are patent. Right AICA origin is patent. SCA origins are patent. There is evidence of mild distal basilar artery stenosis at the level of the SCA origins, although  evaluation is partially limited by motion artifact through this area. There is a fetal type origin of the left PCA with the left P1 segment appearing hypoplastic and irregular. PCAs are mildly irregular diffusely without evidence of significant proximal stenosis.  Internal carotid arteries are patent from skullbase to carotid termini without evidence of stenosis. There is a slightly bulbous appearance of the left posterior communicating artery origin which is favored to reflect motion artifact and possibly mild infundibular dilatation without discrete aneurysm identified. ACAs are unremarkable. M1 segments are patent without stenosis. No major MCA branch vessel occlusion is identified, although there is mild-to-moderate bilateral branch vessel irregularities suggestive of atherosclerosis.  IMPRESSION: 1. Acute left basal ganglia infarct. 2. Moderate chronic small vessel ischemic disease and chronic infarcts as above. 3. No evidence of major intracranial arterial occlusion or significant proximal anterior circulation stenosis. Mild distal basilar stenosis and evidence of branch vessel atherosclerosis.   Electronically Signed   By: Logan Bores   On: 02/03/2014 11:52   Dg Swallowing Func-speech Pathology  02/03/2014   Macario Golds, CCC-SLP     02/03/2014 12:42 PM Objective Swallowing Evaluation: Modified Barium Swallowing Study   Patient Details  Name: TAELYR JANTZ MRN: [id number] Date of Birth: Aug 03, 1928  Today's Date: 02/03/2014 Time: 1133-1200 SLP Time Calculation (min) (ACUTE ONLY): 27 min  Past Medical History:  Past Medical History  Diagnosis Date  . Thyroid disease   . Hyperlipidemia   . Hypertension   . Glaucoma   . Arthritis   . Bladder infection    Past Surgical History:  Past Surgical History  Procedure Laterality Date  . Thyroid surgery  1976  . Tonsillectomy and adenoidectomy    . Vaginal prolapse repair     HPI:  80 y.o. female essential hypertension, and previous strokes  currently on aspirin  that comes in for sudden onset of slurred  speech. She laid she woke up like at 2 AM and when she looked in  mirror she had a facial droop. She called her neighbor and her  neighbor cannot understand her. She denies any chest pain,  shortness of breath no change in her medications.  CT head  negative, pt with old basal ganglia and thalamic cva.  Swallow  evaluation ordered.      Assessment / Plan / Recommendation Clinical Impression  Dysphagia Diagnosis: Mild oral phase dysphagia;Mild pharyngeal  phase dysphagia Clinical impression: Mild sensorimotor oropharyngeal dysphagia  with delayed oral transiting, decreased laryngeal  elevation/closure and impaired epiglottic deflection.  Laryngeal  penetration noted with liquids - *nectar,thin* to vocal cords.   Use of chin tuck with nectar decreased penetration to minimal and  cued cough/throat clear effective to remove penetrates.  Mild  vallecular residuals noted without pt full sensation.  Chin tuck  posture also decreases amount of pharyngeal residuals as well as  cued effortful dry swallow.  Pt expectoration removed  approximately 10% of vallecular residuals that pt did not clear  into esophagus, head  turn right not effective with or without  chin tuck.  Recommend modified diet to maximize airway  protection.  Skilled intervention included educating pt to  findings and reinforcing effective compensation strategies.      Treatment Recommendation  Therapy as outlined in treatment plan below    Diet Recommendation Dysphagia 3 (Mechanical Soft);Nectar-thick  liquid (tsps thin ok with chin tuck)   Liquid Administration via: Cup;Straw;Spoon Medication Administration: Crushed with puree Supervision: Patient able to self feed;Full supervision/cueing  for compensatory strategies Compensations: Slow rate;Small sips/bites;Clear throat  intermittently;Effortful swallow;Multiple dry swallows after each  bite/sip Postural Changes and/or Swallow Maneuvers: Seated upright 90   degrees;Upright 30-60 min after meal;Chin tuck (chin tuck with  ALL boluses)    Other  Recommendations Recommended Consults: MBS Oral Care Recommendations: Oral care BID Other Recommendations: Have oral suction available   Follow Up Recommendations   (TBD)    Frequency and Duration min 2x/week  2 weeks     General Date of Onset: 02/03/14 HPI: 80 y.o. female essential hypertension, and previous strokes  currently on aspirin that comes in for sudden onset of slurred  speech. She laid she woke up like at 2 AM and when she looked in  mirror she had a facial droop. She called her neighbor and her  neighbor cannot understand her. She denies any chest pain,  shortness of breath no change in her medications.  CT head  negative, pt with old basal ganglia and thalamic cva.  Swallow  evaluation ordered.  Type of Study: Modified Barium Swallowing Study Reason for Referral: Objectively evaluate swallowing function  (concern for pharyngeal dysphagia s/p cva) Diet Prior to this Study: NPO Temperature Spikes Noted: No Respiratory Status: Room air History of Recent Intubation: No Behavior/Cognition: Alert;Cooperative;Pleasant mood Oral Cavity - Dentition: Adequate natural dentition Oral Motor / Sensory Function: Impaired - see Bedside swallow  eval Self-Feeding Abilities: Able to feed self Patient Positioning: Upright in chair Baseline Vocal Quality: Low vocal intensity;Breathy Volitional Cough: Strong Volitional Swallow: Able to elicit Anatomy: Within functional limits Pharyngeal Secretions: Not observed secondary MBS (pt does report  issue with post nasal drip - chronic)    Reason for Referral Objectively evaluate swallowing function  (concern for pharyngeal dysphagia s/p cva)   Oral Phase Oral Preparation/Oral Phase Oral Phase: Impaired Oral - Nectar Oral - Nectar Teaspoon: Weak lingual manipulation;Reduced  posterior propulsion Oral - Nectar Cup: Weak lingual manipulation;Reduced posterior  propulsion Oral - Thin Oral - Thin  Teaspoon: Weak lingual manipulation;Reduced posterior  propulsion Oral - Thin Cup: Weak lingual manipulation;Reduced posterior  propulsion Oral - Thin Straw: Lingual/palatal residue;Weak lingual  manipulation Oral - Solids Oral - Puree: Weak lingual manipulation;Reduced posterior  propulsion Oral - Regular: Reduced posterior propulsion;Weak lingual  manipulation Oral - Pill: Weak lingual manipulation;Reduced posterior  propulsion   Pharyngeal Phase Pharyngeal Phase Pharyngeal Phase: Impaired Pharyngeal - Nectar Pharyngeal - Nectar Cup: Reduced airway/laryngeal  closure;Pharyngeal residue - valleculae;Penetration/Aspiration  during swallow Penetration/Aspiration details (nectar cup): Material enters  airway, remains ABOVE vocal cords and not ejected out;Material  enters airway, CONTACTS cords and not ejected out Pharyngeal - Nectar Straw: Reduced airway/laryngeal  closure;Pharyngeal residue - valleculae;Penetration/Aspiration  during swallow Penetration/Aspiration details (nectar straw): Material does not  enter airway;Material enters airway, remains ABOVE vocal cords  and not ejected out Pharyngeal - Thin Pharyngeal - Thin Teaspoon: Reduced airway/laryngeal  closure;Reduced epiglottic inversion;Penetration/Aspiration  during swallow Penetration/Aspiration details (thin teaspoon): Material enters  airway, remains ABOVE vocal cords and not ejected out Pharyngeal -  Thin Cup: Reduced airway/laryngeal closure;Reduced  epiglottic inversion;Pharyngeal residue -  valleculae;Penetration/Aspiration during swallow Penetration/Aspiration details (thin cup): Material enters  airway, CONTACTS cords and not ejected out Pharyngeal - Thin Straw: Reduced airway/laryngeal closure;Reduced  epiglottic inversion;Pharyngeal residue -  valleculae;Penetration/Aspiration during swallow Penetration/Aspiration details (thin straw): Material enters  airway, CONTACTS cords and not ejected out Pharyngeal - Solids Pharyngeal - Puree: Reduced  airway/laryngeal closure;Reduced  epiglottic inversion;Pharyngeal residue - valleculae Pharyngeal - Regular: Reduced airway/laryngeal closure;Reduced  epiglottic inversion;Pharyngeal residue - valleculae Pharyngeal Phase - Comment Pharyngeal Comment: chin tuck posture decreases amount of  pharyngeal residuals, cued effortful dry swallow further  decreased residuals and pt expectoration removed approximately  10% that pt did not clear into esophagus, head turn right not  effective with or without chin tuck  Cervical Esophageal Phase    GO    Cervical Esophageal Phase Cervical Esophageal Phase: Dahlia Byes, MS [facility] SLP [phone]    Mr Jodene Nam Head/brain Wo Cm  02/03/2014   CLINICAL DATA:  Acute onset slurred speech and right facial droop this morning. Stroke.  EXAM: MRI HEAD WITHOUT CONTRAST  MRA HEAD WITHOUT CONTRAST  TECHNIQUE: Multiplanar, multiecho pulse sequences of the brain and surrounding structures were obtained without intravenous contrast. Angiographic images of the head were obtained using MRA technique without contrast.  COMPARISON:  02/03/2014 head CT and 04/05/2013 MRI  FINDINGS: MRI HEAD FINDINGS  There is a small, acute left lateral lenticulostriate territory infarct involving the body of the caudate, corona radiata, and posterior lentiform nucleus. Tiny foci of heterogeneously increased signal in the posterior midbrain near the colliculi on axial diffusion images are not confirmed on coronal images and felt to be artifactual.  There is no evidence of intracranial hemorrhage, mass, midline shift, or extra-axial fluid collection. Small, chronic cortical infarcts are again seen in the right temporal, right parietal, and right occipital lobes, unchanged. Patchy T2 hyperintensities elsewhere in the subcortical and deep cerebral white matter are unchanged from the prior MRI nonspecific but compatible with moderate chronic small vessel ischemic disease. There is mild cerebral atrophy, within  normal limits for age. Chronic lacunar infarcts are again seen in the right basal ganglia.  Prior bilateral cataract extraction is noted. There is mild right maxillary sinus mucosal thickening. No significant mastoid air cell fluid is seen. Major intracranial vascular flow voids are preserved.  MRA HEAD FINDINGS  Images are mildly degraded by motion artifact.  Visualized distal vertebral arteries are patent with the left being mildly dominant. PICA origins are patent. Right AICA origin is patent. SCA origins are patent. There is evidence of mild distal basilar artery stenosis at the level of the SCA origins, although evaluation is partially limited by motion artifact through this area. There is a fetal type origin of the left PCA with the left P1 segment appearing hypoplastic and irregular. PCAs are mildly irregular diffusely without evidence of significant proximal stenosis.  Internal carotid arteries are patent from skullbase to carotid termini without evidence of stenosis. There is a slightly bulbous appearance of the left posterior communicating artery origin which is favored to reflect motion artifact and possibly mild infundibular dilatation without discrete aneurysm identified. ACAs are unremarkable. M1 segments are patent without stenosis. No major MCA branch vessel occlusion is identified, although there is mild-to-moderate bilateral branch vessel irregularities suggestive of atherosclerosis.  IMPRESSION: 1. Acute left basal ganglia infarct. 2. Moderate chronic small vessel ischemic disease and chronic infarcts as above. 3. No evidence  of major intracranial arterial occlusion or significant proximal anterior circulation stenosis. Mild distal basilar stenosis and evidence of branch vessel atherosclerosis.   Electronically Signed   By: Logan Bores   On: 02/03/2014 11:52    DISCHARGE EXAMINATION: Filed Vitals:   02/06/14 2228 02/07/14 0205 02/07/14 0518 02/07/14 1034  BP: 168/78 197/77 175/93 177/85    Pulse: 71 68 61 79  Temp: 97.7 F (36.5 C) 97.7 F (36.5 C) 97.7 F (36.5 C) 98.2 F (36.8 C)  TempSrc: Oral Oral Oral Oral  Resp: 18 18 18 20   SpO2: 96% 95% 93% 94%   General appearance: alert, cooperative, appears stated age and no distress Cardio: regular rate and rhythm, S1, S2 normal, no murmur, click, rub or gallop GI: soft, non-tender; bowel sounds normal; no masses,  no organomegaly Neurologic: Alert and oriented 3. Slight right facial droop. No left facial droop. Right sided weakness is appreciated. Sensation over face bilaterally was equal and normal. No pronator drift. Strength was weaker on the right side due to stroke. No new deficits identified.  DISPOSITION: Inpatient rehabilitation  ALLERGIES: No Known Allergies  Current Inpatient Medications:  Scheduled: .  stroke: mapping our early stages of recovery book   Does not apply Once  . aspirin EC  81 mg Oral Daily  . benazepril  40 mg Oral Daily  . dipyridamole-aspirin  1 capsule Oral QPC supper  . dorzolamide-timolol  1 drop Both Eyes BID  . escitalopram  10 mg Oral QHS  . heparin  5,000 Units Subcutaneous 3 times per day  . latanoprost  1 drop Both Eyes QHS  . levothyroxine  100 mcg Oral QAC breakfast  . polyethylene glycol  17 g Oral Daily  . senna-docusate  1 tablet Oral BID  . simvastatin  40 mg Oral q1800   Continuous:  CXK:[id number], hydrALAZINE, naphazoline-pheniramine, ondansetron (ZOFRAN) IV, RESOURCE THICKENUP CLEAR, senna-docusate  Discharge medications to be determined when she is released from inpatient rehabilitation.  Follow-up Information    Follow up with PERINI,MARK A, MD. Schedule an appointment as soon as possible for a visit in 1 week.   Specialty:  Internal Medicine   Why:  to discuss low EF on echocardiogram.   Contact information:   [place] [address] [phone]       Follow up with SETHI,PRAMOD, MD In 2 months.   Specialties:  Neurology, Radiology    Contact information:   [address] [place] [address] [place] TIME: 35 mins.  [facility]  [facility] Pager [phone]  02/07/2014, 11:05 AM

## 2014-02-07 NOTE — Discharge Instructions (Signed)
Ischemic Stroke °A stroke (cerebrovascular accident) is the sudden death of brain tissue. It is a medical emergency. A stroke can cause permanent loss of brain function. This can cause problems with different parts of your body. A transient ischemic attack (TIA) is different because it does not cause permanent damage. A TIA is a short-lived problem of poor blood flow affecting a part of the brain. A TIA is also a serious problem because having a TIA greatly increases the chances of having a stroke. When symptoms first develop, you cannot know if the problem might be a stroke or a TIA. °CAUSES  °A stroke is caused by a decrease of oxygen supply to an area of your brain. It is usually the result of a small blood clot or collection of cholesterol or fat (plaque) that blocks blood flow in the brain. A stroke can also be caused by blocked or damaged carotid arteries.  °RISK FACTORS °· High blood pressure (hypertension). °· High cholesterol. °· Diabetes mellitus. °· Heart disease. °· The buildup of plaque in the blood vessels (peripheral artery disease or atherosclerosis). °· The buildup of plaque in the blood vessels providing blood and oxygen to the brain (carotid artery stenosis). °· An abnormal heart rhythm (atrial fibrillation). °· Obesity. °· Smoking. °· Taking oral contraceptives (especially in combination with smoking). °· Physical inactivity. °· A diet high in fats, salt (sodium), and calories. °· Alcohol use. °· Use of illegal drugs (especially cocaine and methamphetamine). °· Being African American. °· Being over the age of 55. °· Family history of stroke. °· Previous history of blood clots, stroke, TIA, or heart attack. °· Sickle cell disease. °SYMPTOMS  °These symptoms usually develop suddenly, or may be newly present upon awakening from sleep: °· Sudden weakness or numbness of the face, arm, or leg, especially on one side of the body. °· Sudden trouble walking or difficulty moving arms or legs. °· Sudden  confusion. °· Sudden personality changes. °· Trouble speaking (aphasia) or understanding. °· Difficulty swallowing. °· Sudden trouble seeing in one or both eyes. °· Double vision. °· Dizziness. °· Loss of balance or coordination. °· Sudden severe headache with no known cause. °· Trouble reading or writing. °DIAGNOSIS  °Your health care provider can often determine the presence or absence of a stroke based on your symptoms, history, and physical exam. Computed tomography (CT) of the brain is usually performed to confirm the stroke, determine causes, and determine stroke severity. Other tests may be done to find the cause of the stroke. These tests may include: °· Electrocardiography. °· Continuous heart monitoring. °· Echocardiography. °· Carotid ultrasonography. °· Magnetic resonance imaging (MRI). °· A scan of the brain circulation. °· Blood tests. °PREVENTION  °The risk of a stroke can be decreased by appropriately treating high blood pressure, high cholesterol, diabetes, heart disease, and obesity and by quitting smoking, limiting alcohol, and staying physically active. °TREATMENT  °Time is of the essence. It is important to seek treatment at the first sign of these symptoms because you may receive a medicine to dissolve the clot (thrombolytic) that cannot be given if too much time has passed since your symptoms began. Even if you do not know when your symptoms began, get treatment as soon as possible as there are other treatment options available including oxygen, intravenous (IV) fluids, and medicines to thin the blood (anticoagulants). Treatment of stroke depends on the duration, severity, and cause of your symptoms. Medicines and dietary changes may be used to address diabetes, high blood   pressure, and other risk factors. Physical, speech, and occupational therapists will assess you and work with you to improve any functions impaired by the stroke. Measures will be taken to prevent short-term and long-term  complications, including infection from breathing foreign material into the lungs (aspiration pneumonia), blood clots in the legs, bedsores, and falls. Rarely, surgery may be needed to remove large blood clots or to open up blocked arteries. °HOME CARE INSTRUCTIONS  °· Take medicines only as directed by your health care provider. Follow the directions carefully. Medicines may be used to control risk factors for a stroke. Be sure you understand all your medicine instructions. °· You may be told to take a medicine to thin the blood, such as aspirin or the anticoagulant warfarin. Warfarin needs to be taken exactly as instructed. °¨ Too much and too little warfarin are both dangerous. Too much warfarin increases the risk of bleeding. Too little warfarin continues to allow the risk for blood clots. While taking warfarin, you will need to have regular blood tests to measure your blood clotting time. These blood tests usually include both the PT and INR tests. The PT and INR results allow your health care provider to adjust your dose of warfarin. The dose can change for many reasons. It is critically important that you take warfarin exactly as prescribed, and that you have your PT and INR levels drawn exactly as directed. °¨ Many foods, especially foods high in vitamin K, can interfere with warfarin and affect the PT and INR results. Foods high in vitamin K include spinach, kale, broccoli, cabbage, collard and turnip greens, brussels sprouts, peas, cauliflower, seaweed, and parsley, as well as beef and pork liver, green tea, and soybean oil. You should eat a consistent amount of foods high in vitamin K. Avoid major changes in your diet, or notify your health care provider before changing your diet. Arrange a visit with a dietitian to answer your questions. °¨ Many medicines can interfere with warfarin and affect the PT and INR results. You must tell your health care provider about any and all medicines you take. This  includes all vitamins and supplements. Be especially cautious with aspirin and anti-inflammatory medicines. Do not take or discontinue any prescribed or over-the-counter medicine except on the advice of your health care provider or pharmacist. °¨ Warfarin can have side effects, such as excessive bruising or bleeding. You will need to hold pressure over cuts for longer than usual. Your health care provider or pharmacist will discuss other potential side effects. °¨ Avoid sports or activities that may cause injury or bleeding. °¨ Be mindful when shaving, flossing your teeth, or handling sharp objects. °¨ Alcohol can change the body's ability to handle warfarin. It is best to avoid alcoholic drinks or consume only very small amounts while taking warfarin. Notify your health care provider if you change your alcohol intake. °¨ Notify your dentist or other health care providers before procedures. °· If swallow studies have determined that your swallowing reflex is present, you should eat healthy foods. Including 5 or more servings of fruits and vegetables a day may reduce the risk of stroke. Foods may need to be a certain consistency (soft or pureed), or small bites may need to be taken in order to avoid aspirating or choking. Certain dietary changes may be advised to address high blood pressure, high cholesterol, diabetes, or obesity. °¨ Food choices that are low in sodium, saturated fat, trans fat, and cholesterol are recommended to manage high blood pressure. °¨   Food choies that are high in fiber, and low in saturated fat, trans fat, and cholesterol may control cholesterol levels. °¨ Controlling carbohydrates and sugar intake is recommended to manage diabetes. °¨ Reducing calorie intake and making food choices that are low in sodium, saturated fat, trans fat, and cholesterol are recommended to manage obesity. °· Maintain a healthy weight. °· Stay physically active. It is recommended that you get at least 30 minutes of  activity on all or most days. °· Do not use any tobacco products including cigarettes, chewing tobacco, or electronic cigarettes. °· Limit alcohol use even if you are not taking warfarin. Moderate alcohol use is considered to be: °¨ No more than 2 drinks each day for men. °¨ No more than 1 drink each day for nonpregnant women. °· Home safety. A safe home environment is important to reduce the risk of falls. Your health care provider may arrange for specialists to evaluate your home. Having grab bars in the bedroom and bathroom is often important. Your health care provider may arrange for equipment to be used at home, such as raised toilets and a seat for the shower. °· Physical, occupational, and speech therapy. Ongoing therapy may be needed to maximize your recovery after a stroke. If you have been advised to use a walker or a cane, use it at all times. Be sure to keep your therapy appointments. °· Follow all instructions for follow-up with your health care provider. This is very important. This includes any referrals, physical therapy, rehabilitation, and lab tests. Proper follow-up can prevent another stroke from occurring. °SEEK MEDICAL CARE IF: °· You have personality changes. °· You have difficulty swallowing. °· You are seeing double. °· You have dizziness. °· You have a fever. °· You have skin breakdown. °SEEK IMMEDIATE MEDICAL CARE IF:  °Any of these symptoms may represent a serious problem that is an emergency. Do not wait to see if the symptoms will go away. Get medical help right away. Call your local emergency services (911 in [place] Do not drive yourself to the hospital. °· You have sudden weakness or numbness of the face, arm, or leg, especially on one side of the body. °· You have sudden trouble walking or difficulty moving arms or legs. °· You have sudden confusion. °· You have trouble speaking (aphasia) or understanding. °· You have sudden trouble seeing in one or both eyes. °· You have a loss of  balance or coordination. °· You have a sudden, severe headache with no known cause. °· You have new chest pain or an irregular heartbeat. °· You have a partial or total loss of consciousness. °Document Released: 02/09/2005 Document Revised: 06/26/2013 Document Reviewed: 09/20/2011 °[employer]® Patient Information ©2015 [employer]. This information is not intended to replace advice given to you by your health care provider. Make sure you discuss any questions you have with your health care provider. ° °

## 2014-02-07 NOTE — Progress Notes (Signed)
Patient ID: Amanda Cochran, female   DOB: December 10, 1928, 80 y.o.   MRN: [id number] Patient admitted to [phone] via wheelchair, escorted by nursing staff.  Patient verbalized understanding of rehab process, signed fall safety agreement.  Appears to be in no immediate distress at this time.  Will continue to monitor.  [name], RN

## 2014-02-07 NOTE — Progress Notes (Signed)
Patient discharged to rehab unit via wheelchair. Patient has no c/o pain or any distress noted. Patient's belonging also taken with patient. Patient aware of belongings placement in room. Tomma Rakers RN

## 2014-02-07 NOTE — H&P (Signed)
Physical Medicine and Rehabilitation Admission H&P   Chief Complaint  Patient presents with  . Code Stroke  : Chief complaint: Weakness  HPI: CHIRSTINE Cochran is a 80 y.o. right handed female with history of hypertension and previous strokes with little residual weakness maintained on aspirin 325 mg daily and severe glaucoma with limited vision. Lives alone and uses a cane. He presented 02/03/2014 with slurred speech and right facial droop. MRI showed acute left basal ganglia infarct as well as moderate chronic small vessel ischemic disease. MRA with no evidence of major intracranial occlusion or stenosis. Patient did not receive TPA. Echocardiogram with ejection fraction of 40% without embolism. Carotid Doppler no ICA stenosis. Neurology services consulted presently on aspirin 81 mg daily as well as Aggrenox. Dysphagia 3 nectar thick liquid diet. Subcutaneous heparin has been added for DVT prophylaxis. Physical therapy evaluation completed 02/04/2014 with recommendations of physical medicine rehabilitation consult. Patient was admitted for comprehensive rehabilitation program  ROS Review of Systems  Eyes:   Poor vision related to glaucoma  Musculoskeletal: Positive for myalgias and joint pain.  Neurological: Positive for dizziness Psychiatric. Depression.  All other systems reviewed and are negative   Past Medical History  Diagnosis Date  . Thyroid disease   . Hyperlipidemia   . Hypertension   . Glaucoma   . Arthritis   . Bladder infection    Past Surgical History  Procedure Laterality Date  . Thyroid surgery  1976  . Tonsillectomy and adenoidectomy    . Vaginal prolapse repair     History reviewed. No pertinent family history. Social History:  reports that she has never smoked. She does not have any smokeless tobacco history on file. She reports that she does not drink alcohol or use illicit drugs. Allergies: No Known  Allergies Medications Prior to Admission  Medication Sig Dispense Refill  . aspirin 325 MG tablet Take 325 mg by mouth at bedtime.    . benazepril (LOTENSIN) 40 MG tablet Take 40 mg by mouth daily.    . Calcium Carbonate (CALTRATE 600 PO) Take 1 tablet by mouth daily.     . Cholecalciferol (VITAMIN D) 2000 UNITS CAPS Take 1 capsule by mouth daily.    . dorzolamide-timolol (COSOPT) 22.3-6.8 MG/ML ophthalmic solution Place 1 drop into both eyes 2 (two) times daily.    . Fish Oil-Cholecalciferol (FISH OIL + D3) 1200-1000 MG-UNIT CAPS Take 1 tablet by mouth daily.     Marland Kitchen latanoprost (XALATAN) 0.005 % ophthalmic solution Place 1 drop into both eyes at bedtime.     Marland Kitchen levothyroxine (SYNTHROID, LEVOTHROID) 100 MCG tablet Take 100 mcg by mouth daily.    . meclizine (ANTIVERT) 25 MG tablet Take 1 tablet (25 mg total) by mouth 4 (four) times daily. (Patient taking differently: Take 25 mg by mouth 3 (three) times daily as needed for dizziness. ) 28 tablet 0  . Multiple Vitamins-Minerals (CENTRUM SILVER PO) Take 1 tablet by mouth daily.    . ondansetron (ZOFRAN ODT) 4 MG disintegrating tablet Take 1 tablet (4 mg total) by mouth every 8 (eight) hours as needed for nausea or vomiting. 20 tablet 0  . simvastatin (ZOCOR) 40 MG tablet Take 40 mg by mouth every evening.    . brinzolamide (AZOPT) 1 % ophthalmic suspension Place 1 drop into both eyes 2 (two) times daily.     Marland Kitchen escitalopram (LEXAPRO) 10 MG tablet Take 10 mg by mouth at bedtime.      Home: Home Living Family/patient  expects to be discharged to:: Private residence Living Arrangements: Alone Available Help at Discharge: Neighbor, Available PRN/intermittently Type of Home: House Home Access: Stairs to enter [employer] of Steps: 3 Entrance Stairs-Rails: Right, Left Home Layout: Two level, Able to live on main level with bedroom/bathroom, Bed/bath  upstairs Alternate Level Stairs-Number of Steps: 7 Alternate Level Stairs-Rails: Left Home Equipment: Other (comment), Bedside commode, Toilet riser (White cane) Lives With: Alone  Functional History: Prior Function Level of Independence: Independent Comments: [facility] and received for a white cane about 2 weeks ago. Also received brail stickers for microwave. Pt has a small dog and reports that the dog is the same color as her hardwood floors and amy need (A) taking care of her.  Functional Status:  Mobility: Bed Mobility Overal bed mobility: Needs Assistance Bed Mobility: Supine to Sit Supine to sit: Supervision General bed mobility comments: Supervision for safety Transfers Overall transfer level: Needs assistance Equipment used: None Transfers: Sit to/from Stand Sit to Stand: Min guard General transfer comment: Min guard (A) for balance and safety. Pt reaching for objects to hold onto immediately upon standing. Ambulation/Gait Ambulation/Gait assistance: Min assist Ambulation Distance (Feet): 120 Feet Assistive device: 1 person hand held assist Gait Pattern/deviations: Step-through pattern, Decreased stride length Gait velocity interpretation: Below normal speed for age/gender General Gait Details: Min A for balance with gait. Patient with lateral LOB x2 with min A to correct. Cues to increase step length and to manuver through tight spaces.     ADL: ADL Overall ADL's : Needs assistance/impaired Grooming: Brushing hair, Min guard, Standing Toilet Transfer: Min guard, Ambulation, Regular Toilet, Grab bars Toilet Transfer Details (indicate cue type and reason): Min guard (A) for safety and balance. Toileting- Water quality scientist and Hygiene: Min guard, Sitting/lateral lean Functional mobility during ADLs: Min guard General ADL Comments: Pt with several small LOB during session. Pt reaching for surfaces (door handle, counters, grab  bars) to hold onto while ambulating to and from bathroom. Discussed environmental and safety precautions and modifications for home. Pt seemed aggreable to having HHOT come out and assess/help make adaptation fors pt's low vision.  Cognition: Cognition Overall Cognitive Status: Impaired/Different from baseline Arousal/Alertness: Awake/alert Orientation Level: Oriented X4 Attention: Sustained Sustained Attention: Appears intact Memory: Impaired Memory Impairment: Storage deficit, Decreased recall of new information Awareness: Appears intact Problem Solving: Appears intact Safety/Judgment: Appears intact Cognition Arousal/Alertness: Awake/alert Behavior During Therapy: WFL for tasks assessed/performed Overall Cognitive Status: Impaired/Different from baseline  Physical Exam: Blood pressure 163/69, pulse 70, temperature 98 F (36.7 C), temperature source Oral, resp. rate 18, SpO2 96 %. Physical Exam Constitutional: She is oriented to person, place, and time.  HENT: oral mucosa pink and moist Head: Normocephalic.  Eyes:  Pupils round and reactive to light  Neck: Normal range of motion. Neck supple. No thyromegaly present.  Cardiovascular: Normal rate and regular rhythm. no murmur or rub Respiratory: Effort normal and breath sounds normal. No respiratory distress.  GI: Soft. Bowel sounds are normal. She exhibits no distension.  Neurological: She is alert and oriented to person, place, and time.  Speech remains a bit dysarthric but fully intelligible. She follows commands. Right facial droop and tongue deviation. Visual acuity poor. Sensation 1 to 1+/2 right face, arm, leg. No pronator drift but decreased Oriental R>L. Resting tremor bilaterally, R>L.  RUE 4/5 to 4+ deltoid, bicep, tricep, wrist, HI. RLE 4/5 HF, KE, ADF/APF.  reasonable insight and awareness.  Skin: Skin is  warm and dry.  Psychiatric:  Anxious  Lab Results Last 48 Hours    Results for orders placed or performed  during the hospital encounter of 02/03/14 (from the past 48 hour(s))  Basic metabolic panel Status: Abnormal   Collection Time: 02/06/14 11:44 AM  Result Value Ref Range   Sodium 142 137 - 147 mEq/L   Potassium 4.2 3.7 - 5.3 mEq/L   Chloride 105 96 - 112 mEq/L   CO2 25 19 - 32 mEq/L   Glucose, Bld 102 (H) 70 - 99 mg/dL   BUN 20 6 - 23 mg/dL   Creatinine, Ser 0.83 0.50 - 1.10 mg/dL   Calcium 9.1 8.4 - 10.5 mg/dL   GFR calc non Af Amer 63 (L) >90 mL/min   GFR calc Af Amer 74 (L) >90 mL/min    Comment: (NOTE) The eGFR has been calculated using the CKD EPI equation. This calculation has not been validated in all clinical situations. eGFR's persistently <90 mL/min signify possible Chronic Kidney Disease.    Anion gap 12 5 - 15  CBC Status: Abnormal   Collection Time: 02/06/14 11:44 AM  Result Value Ref Range   WBC 10.8 (H) 4.0 - 10.5 K/uL   RBC 4.76 3.87 - 5.11 MIL/uL   Hemoglobin 13.7 12.0 - 15.0 g/dL   HCT 41.8 36.0 - 46.0 %   MCV 87.8 78.0 - 100.0 fL   MCH 28.8 26.0 - 34.0 pg   MCHC 32.8 30.0 - 36.0 g/dL   RDW 14.6 11.5 - 15.5 %   Platelets 258 150 - 400 K/uL      Imaging Results (Last 48 hours)    No results found.       Medical Problem List and Plan: 1. Functional deficits secondary to left basal ganglia infarct secondary to small vessel disease 2. DVT Prophylaxis/Anticoagulation: Subcutaneous heparin. Monitor platelet counts and any signs of bleeding 3. Pain Management: Tylenol as needed 4. Dysphagia. Dysphagia 3 and liquids. Monitor for any signs of aspiration. Follow-up speech therapy 5. Neuropsych: This patient is capable of making decisions on his own behalf. 6. Skin/Wound Care: Routine skin checks 7. Fluids/Electrolytes/Nutrition: Strict I and O's. Follow-up chemistries 8. Hypertension. Lotensin 40 mg daily. Monitor with increased mobility 9.  Mood/depression. Lexapro 10 mg daily. Provide emotional support 10. Glaucoma. Continue eyedrops as advised 11. Hypothyroidism. Synthroid 12. Hyperlipidemia. Zocor    Post Admission Physician Evaluation: 1. Functional deficits secondary to left basal ganglia infarct. 2. Patient is admitted to receive collaborative, interdisciplinary care between the physiatrist, rehab nursing staff, and therapy team. 3. Patient's level of medical complexity and substantial therapy needs in context of that medical necessity cannot be provided at a lesser intensity of care such as a SNF. 4. Patient has experienced substantial functional loss from his/her baseline which was documented above under the "Functional History" and "Functional Status" headings. Judging by the patient's diagnosis, physical exam, and functional history, the patient has potential for functional progress which will result in measurable gains while on inpatient rehab. These gains will be of substantial and practical use upon discharge in facilitating mobility and self-care at the household level. 5. Physiatrist will provide 24 hour management of medical needs as well as oversight of the therapy plan/treatment and provide guidance as appropriate regarding the interaction of the two. 6. 24 hour rehab nursing will assist with bladder management, bowel management, safety, skin/wound care, disease management, medication administration, pain management and patient education and help integrate therapy concepts, techniques,education, etc. 7. PT  will assess and treat for/with: Lower extremity strength, range of motion, stamina, balance, functional mobility, safety, adaptive techniques and equipment, NMR, visual-spatial awareness, anxiety control, stroke education, community reintegration. Goals are: mod I. 8. OT will assess and treat for/with: ADL's, functional mobility, safety, upper extremity strength, adaptive techniques and equipment, NMR,  visual-spatial awareness, anxiety mgt, leisure awareness. Goals are: mod I. Therapy may proceed with showering this patient. 9. SLP will assess and treat for/with: cognition, communication. Goals are: mod I. 10. Case Management and Social Worker will assess and treat for psychological issues and discharge planning. 11. Team conference will be held weekly to assess progress toward goals and to determine barriers to discharge. 12. Patient will receive at least 3 hours of therapy per day at least 5 days per week. 13. ELOS: 8-10 days  14. Prognosis: excellent     Meredith Staggers, MD, [facility] 02/07/2014

## 2014-02-07 NOTE — Progress Notes (Signed)
PMR Admission Coordinator Pre-Admission Assessment  Patient: Amanda Cochran is an 80 y.o., female MRN: [id number] DOB: [birth year] Height:   Weight:    Insurance Information HMO: PPO: PCP: IPA: 80/20: OTHER:  PRIMARY: Medicare A/B Policy#: [id number] Subscriber: Amanda Cochran CM Name: Phone#: Fax#:  Pre-Cert#: Employer: Retired Benefits: Phone #: Name: Checked in [place]. Date: 08/24/95 Deduct: $1260 Out of Pocket Max: none Life Max: unlimited CIR: 100% SNF: 100 days Outpatient: 80% Co-Pay: 20% Home Health: 100% Co-Pay: none DME: 80% Co-Pay: 20% Providers: patient's choice  SECONDARY: [employer] [place] sup Policy#: [id number] Subscriber: Amanda Cochran CM Name: Phone#: Fax#:  Pre-Cert#: Employer: Retired Benefits: Phone #: [phone] Name:  Eff. Date: Deduct: Out of Pocket Max: Life Max:  CIR: SNF:  Outpatient: Co-Pay:  Home Health: Co-Pay:  DME: Co-Pay:    Emergency Contact Information Contact Information    Name Relation Home Work Mobile   Amanda Cochran Nephew   Rarden Other [place] [phone]       Current Medical History  Patient Admitting Diagnosis: L BG infarct  History of Present Illness: An 80 y.o. right handed female with history of hypertension and previous strokes with little residual weakness maintained on aspirin 325 mg daily and severe glaucoma with limited vision. Lives alone and uses a cane. She presented 02/03/2014 with slurred speech and right facial droop. MRI showed acute left basal ganglia infarct as well as moderate chronic small vessel ischemic  disease. MRA with no evidence of major intracranial occlusion or stenosis. Patient did not receive TPA. Echocardiogram with ejection fraction of 40% without embolism. Carotid Doppler no ICA stenosis. Neurology services consulted presently on aspirin 81 mg daily as well as Aggrenox. Dysphagia 3 nectar thick liquid diet. Subcutaneous heparin has been added for DVT prophylaxis. Physical therapy evaluation completed 02/04/2014 with recommendations of physical medicine rehabilitation consult. Patient to be admitted for comprehensive inpatient rehabilitation program.   Total: 2=NIH  Past Medical History  Past Medical History  Diagnosis Date  . Thyroid disease   . Hyperlipidemia   . Hypertension   . Glaucoma   . Arthritis   . Bladder infection     Family History  family history is not on file.  Prior Rehab/Hospitalizations: No previous rehab  Current Medications  Current facility-administered medications: stroke: mapping our early stages of recovery book, , Does not apply, Once, Charlynne Cousins, MD; acetaminophen (TYLENOL) tablet 650 mg, 650 mg, Oral, Q6H PRN, Lacy Duverney, PA-C, 650 mg at 02/05/14 2131; aspirin EC tablet 81 mg, 81 mg, Oral, Daily, David L Rinehuls, PA-C, 81 mg at 02/06/14 5361; benazepril (LOTENSIN) tablet 40 mg, 40 mg, Oral, Daily, Eugenie Filler, MD, 40 mg at 02/06/14 0958 dipyridamole-aspirin (AGGRENOX) 200-25 MG per 12 hr capsule 1 capsule, 1 capsule, Oral, QPC supper, David L Rinehuls, PA-C, 1 capsule at 02/06/14 1643; dorzolamide-timolol (COSOPT) 22.3-6.8 MG/ML ophthalmic solution 1 drop, 1 drop, Both Eyes, BID, Charlynne Cousins, MD, 1 drop at 02/06/14 2141; escitalopram (LEXAPRO) tablet 10 mg, 10 mg, Oral, QHS, Charlynne Cousins, MD, 10 mg at 02/06/14 2136 heparin injection 5,000 Units, 5,000 Units, Subcutaneous, 3 times per day, Charlynne Cousins, MD, 5,000 Units at 02/07/14 0536; hydrALAZINE (APRESOLINE) injection 5 mg, 5 mg,  Intravenous, Q6H PRN, Charlynne Cousins, MD, 5 mg at 02/04/14 0625; latanoprost (XALATAN) 0.005 % ophthalmic solution 1 drop, 1 drop, Both Eyes, QHS, Charlynne Cousins, MD, 1 drop at 02/06/14 2141 levothyroxine (SYNTHROID, LEVOTHROID) tablet  100 mcg, 100 mcg, Oral, QAC breakfast, Donne Hazel, MD, 100 mcg at 02/07/14 0823; naphazoline-pheniramine (NAPHCON-A) 0.025-0.3 % ophthalmic solution 2 drop, 2 drop, Both Eyes, QID PRN, Donne Hazel, MD; ondansetron [facility]) injection 4 mg, 4 mg, Intravenous, Q4H PRN, Lacy Duverney, PA-C, 4 mg at 02/06/14 0998 polyethylene glycol (MIRALAX / GLYCOLAX) packet 17 g, 17 g, Oral, Daily, Donne Hazel, MD, 17 g at 02/07/14 3382; RESOURCE THICKENUP CLEAR, , Oral, PRN, Antony Contras, MD; senna-docusate (Senokot-S) tablet 1 tablet, 1 tablet, Oral, QHS PRN, Charlynne Cousins, MD; senna-docusate (Senokot-S) tablet 1 tablet, 1 tablet, Oral, BID, Donne Hazel, MD, 1 tablet at 02/07/14 5053 simvastatin (ZOCOR) tablet 40 mg, 40 mg, Oral, q1800, Early Chars Rinehuls, PA-C, 40 mg at 02/06/14 1644  Patients Current Diet: DIET DYS 3  Precautions / Restrictions Precautions Precautions: Fall Restrictions Weight Bearing Restrictions: No   Prior Activity Level Limited Community (1-2x/wk): Went out 1-2 X a week. A friend drives her to grocery store and to MD appts.   Home Assistive Devices / Equipment Home Assistive Devices/Equipment: Bedside commode/3-in-1, Cane (specify quad or straight) Home Equipment: Other (comment), Bedside commode, Toilet riser (White cane)  Prior Functional Level Prior Function Level of Independence: Independent Comments: [facility] and received for a white cane about 2 weeks ago. Also received brail stickers for microwave. Pt has a small dog and reports that the dog is the same color as her hardwood floors and may need (A) taking care of her.  Current Functional Level Cognition  Arousal/Alertness:  Awake/alert Overall Cognitive Status: Within Functional Limits for tasks assessed Orientation Level: Oriented X4 Attention: Sustained Sustained Attention: Appears intact Memory: Impaired Memory Impairment: Storage deficit, Decreased recall of new information Awareness: Appears intact Problem Solving: Appears intact Safety/Judgment: Appears intact   Extremity Assessment (includes Sensation/Coordination)  Upper Extremity Assessment: Defer to OT evaluation Lower Extremity Assessment: Overall WFL for tasks assessed   ADLs  Overall ADL's : Needs assistance/impaired Grooming: Brushing hair, Min guard, Standing Toilet Transfer: Min guard, Ambulation, Regular Toilet, Grab bars Toilet Transfer Details (indicate cue type and reason): Min guard (A) for safety and balance. Toileting- Water quality scientist and Hygiene: Min guard, Sitting/lateral lean Functional mobility during ADLs: Min guard General ADL Comments: Pt with several small LOB during session. Pt reaching for surfaces (door handle, counters, grab bars) to hold onto while ambulating to and from bathroom. Discussed environmental and safety precautions and modifications for home. Pt seemed aggreable to having HHOT come out and assess/help make adaptation fors pt's low vision.    Mobility  Overal bed mobility: Needs Assistance Bed Mobility: Supine to Sit, Sit to Supine Supine to sit: Supervision Sit to supine: Supervision General bed mobility comments: Supervision for safety. Cues for positioning due to rails    Transfers  Overall transfer level: Needs assistance Equipment used: None Transfers: Sit to/from Stand Sit to Stand: Min guard General transfer comment: Min guard (A) for balance and safety. bracing of LEs on bed/chair    Ambulation / Gait / Stairs / Wheelchair Mobility  Ambulation/Gait Ambulation/Gait assistance: Museum/gallery curator (Feet): 140 Feet Assistive device: None Gait  Pattern/deviations: Step-through pattern, Decreased stride length Gait velocity interpretation: Below normal speed for age/gender General Gait Details: Min A for balance with gait. Patient with increased L sway today    Posture / Balance Berg Balance Test Sit to Stand: Able to stand independently using hands Standing Unsupported: Able to stand 2 minutes  with supervision Sitting with Back Unsupported but Feet Supported on Floor or Stool: Able to sit safely and securely 2 minutes Stand to Sit: Controls descent by using hands Transfers: Able to transfer safely, definite need of hands Standing Unsupported with Eyes Closed: Able to stand 10 seconds with supervision Standing Ubsupported with Feet Together: Able to place feet together independently but unable to hold for 30 seconds From Standing, Reach Forward with Outstretched Arm: Reaches forward but needs supervision From Standing Position, Pick up Object from Floor: Unable to pick up shoe, but reaches 2-5 cm (1-2") from shoe and balances independently From Standing Position, Turn to Look Behind Over each Shoulder: Looks behind one side only/other side shows less weight shift Turn 360 Degrees: Able to turn 360 degrees safely but slowly Standing Unsupported, Alternately Place Feet on Step/Stool: Able to complete >2 steps/needs minimal assist Standing Unsupported, One Foot in Front: Needs help to step but can hold 15 seconds Standing on One Leg: Unable to try or needs assist to prevent fall Total Score: 31    Special needs/care consideration BiPAP/CPAP No CPM No Continuous Drip IV No Dialysis No  Life Vest No Oxygen No Special Bed No Trach Size No Wound Vac (area) No  Skin No  Bowel mgmt: Last BM 02/04/14 Bladder mgmt: Up to bathroom with assistance Diabetic mgmt No Legally Blind and HOH    Previous Home Environment Living Arrangements: Alone Lives With: Alone Available Help at  Discharge: Neighbor, Available PRN/intermittently Type of Home: House Home Layout: Two level, Able to live on main level with bedroom/bathroom, Bed/bath upstairs Alternate Level Stairs-Rails: Left Alternate Level Stairs-Number of Steps: 7 Home Access: Stairs to enter Entrance Stairs-Rails: Right, Left Entrance Stairs-Number of Steps: 3 Bathroom Toilet: Handicapped height Home Care Services: Other (Comment) (delivered meals service)  Discharge Living Setting Plans for Discharge Living Setting: Alone, House Type of Home at Discharge: House Discharge Home Layout: Multi-level, 1/2 bath on main level, Bed/bath upstairs (Split level home.) Main level with living room where patient sleeps, kitchen and a 1/2 bath. Does not go down stairs much anymore. Alternate Level Stairs-Number of Steps: 7 stpes up to BR/BR and 8 steps down to den and storage areas. Discharge Home Access: Stairs to enter Entrance Stairs-Number of Steps: 3 Does the patient have any problems obtaining your medications?: No  Social/Family/Support Systems Patient Roles: Other (Comment) (Neighbor close by. Sister in law 150 miles away.) Contact Information: Hermenia Fiscal - neighbor Anticipated Caregiver: self Ability/Limitations of Caregiver: Neighbor assists with grocery shopping and with MD appointments Caregiver Availability: Intermittent Discharge Plan Discussed with Primary Caregiver: Yes Is Caregiver In Agreement with Plan?: Yes Does Caregiver/Family have Issues with Lodging/Transportation while Pt is in Rehab?: No  Goals/Additional Needs Patient/Family Goal for Rehab: PT/OT/ST mod I goals Expected length of stay: 7-8 days Cultural Considerations: [employer] Dietary Needs: Dys 3, nectar thick liquids Equipment Needs: TBD Pt/Family Agrees to Admission and willing to participate: Yes Program Orientation Provided & Reviewed with Pt/Caregiver Including Roles & Responsibilities: Yes  Decrease burden of Care through IP  rehab admission: N/A  Possible need for SNF placement upon discharge: Yes, if patient is not safe to go home alone with intermittent assistance.  Patient Condition: This patient's condition remains as documented in the consult dated 02/06/14, in which the Rehabilitation Physician determined and documented that the patient's condition is appropriate for intensive rehabilitative care in an inpatient rehabilitation facility. Will admit to inpatient rehab today.  Preadmission Screen Completed By: Retta Diones, 02/07/2014  10:29 AM ______________________________________________________________________  Discussed status with Dr. Naaman Plummer on 02/07/14 at 1028 and received telephone approval for admission today.  Admission Coordinator: Retta Diones, time1028/Date12/16/15          Cosigned by: Meredith Staggers, MD at 02/07/2014 10:35 AM  Revision History

## 2014-02-07 NOTE — Progress Notes (Signed)
RN called report to 4MW RN. Patient aware of transfer to rehab unit. Cardiac monitoring discontinued, IV discontinued and patient gown was changed and presentable. Aisha RN

## 2014-02-07 NOTE — Progress Notes (Signed)
Patient woke up this morning complaining of left side facial swelling and mild tingling, right side of face still has droop with poor  Facial muscle control affecting speech.  Will continue to monitor.

## 2014-02-07 NOTE — Progress Notes (Signed)
Speech Language Pathology Treatment: Dysphagia;Cognitive-Linquistic  Patient Details Name: Amanda Cochran MRN: [id number] DOB: 10-20-1928 Today's Date: 02/07/2014 Time: 9678-9381 SLP Time Calculation (min) (ACUTE ONLY): 15 min  Assessment / Plan / Recommendation Clinical Impression  SLP provided skilled observation of advanced trials of thin liquids with use of chin tuck strategy. Pt verbalized her compensatory strategies with Mod I, and utilized them throughout trials with supervision level A from therapist. Pt requested oral care supplies, which SLP provided. Pt's speech remains mildly dysarthric with Min cues from SLP for repetitions for increased intelligibility at the conversational level.   HPI HPI: 80 yo female admitted due to slurred speech and SBP 270. MRI (+) L basal ganglia infarct. NIH= 4 PMH: glaucoma, HTN, arthritis, vaginal prolapse repair   Pertinent Vitals Pain Assessment: No/denies pain  SLP Plan  Continue with current plan of care    Recommendations Diet recommendations: Dysphagia 3 (mechanical soft);Nectar-thick liquid Liquids provided via: Cup;Straw Medication Administration: Crushed with puree Supervision: Patient able to self feed;Full supervision/cueing for compensatory strategies Compensations: Slow rate;Small sips/bites;Clear throat intermittently;Effortful swallow;Multiple dry swallows after each bite/sip Postural Changes and/or Swallow Maneuvers: Seated upright 90 degrees;Upright 30-60 min after meal;Chin tuck              Oral Care Recommendations: Oral care BID Follow up Recommendations: Inpatient Rehab Plan: Continue with current plan of care    GO      Germain Osgood, M.A. CCC-SLP [phone]  Germain Osgood 02/07/2014, 2:09 PM

## 2014-02-07 NOTE — Progress Notes (Signed)
Expand All Collapse All        Physical Medicine and Rehabilitation Consult Reason for Consult: CVA Referring Physician: [facility]   HPI: Amanda Cochran is a 80 y.o. right handed female with history of hypertension and previous strokes with little residual weakness maintained on aspirin 325 mg daily and severe glaucoma with limited vision. Lives alone and uses a cane. He presented 02/03/2014 with slurred speech and right facial droop. MRI showed acute left basal ganglia infarct as well as moderate chronic small vessel ischemic disease. MRA with no evidence of major intracranial occlusion or stenosis. Patient did not receive TPA. Echocardiogram with ejection fraction of 40% without embolism. Carotid Doppler no ICA stenosis. Neurology services consulted presently on aspirin 81 mg daily as well as Aggrenox. Dysphagia 3 active thick liquid diet. Subcutaneous heparin has been added for DVT prophylaxis. Physical therapy evaluation completed 02/04/2014 with recommendations of physical medicine rehabilitation consult.   Review of Systems  Eyes:   Poor vision related to glaucoma  Musculoskeletal: Positive for myalgias and joint pain.  Neurological: Positive for dizziness.  All other systems reviewed and are negative.  Past Medical History  Diagnosis Date  . Thyroid disease   . Hyperlipidemia   . Hypertension   . Glaucoma   . Arthritis   . Bladder infection    Past Surgical History  Procedure Laterality Date  . Thyroid surgery  1976  . Tonsillectomy and adenoidectomy    . Vaginal prolapse repair     History reviewed. No pertinent family history. Social History:  reports that she has never smoked. She does not have any smokeless tobacco history on file. She reports that she does not drink alcohol or use illicit drugs. Allergies: No Known Allergies Medications Prior to Admission  Medication Sig Dispense Refill  . aspirin 325 MG tablet Take 325  mg by mouth at bedtime.    . benazepril (LOTENSIN) 40 MG tablet Take 40 mg by mouth daily.    . Calcium Carbonate (CALTRATE 600 PO) Take 1 tablet by mouth daily.     . Cholecalciferol (VITAMIN D) 2000 UNITS CAPS Take 1 capsule by mouth daily.    . dorzolamide-timolol (COSOPT) 22.3-6.8 MG/ML ophthalmic solution Place 1 drop into both eyes 2 (two) times daily.    . Fish Oil-Cholecalciferol (FISH OIL + D3) 1200-1000 MG-UNIT CAPS Take 1 tablet by mouth daily.     Marland Kitchen latanoprost (XALATAN) 0.005 % ophthalmic solution Place 1 drop into both eyes at bedtime.     Marland Kitchen levothyroxine (SYNTHROID, LEVOTHROID) 100 MCG tablet Take 100 mcg by mouth daily.    . meclizine (ANTIVERT) 25 MG tablet Take 1 tablet (25 mg total) by mouth 4 (four) times daily. (Patient taking differently: Take 25 mg by mouth 3 (three) times daily as needed for dizziness. ) 28 tablet 0  . Multiple Vitamins-Minerals (CENTRUM SILVER PO) Take 1 tablet by mouth daily.    . ondansetron (ZOFRAN ODT) 4 MG disintegrating tablet Take 1 tablet (4 mg total) by mouth every 8 (eight) hours as needed for nausea or vomiting. 20 tablet 0  . simvastatin (ZOCOR) 40 MG tablet Take 40 mg by mouth every evening.    . brinzolamide (AZOPT) 1 % ophthalmic suspension Place 1 drop into both eyes 2 (two) times daily.     Marland Kitchen escitalopram (LEXAPRO) 10 MG tablet Take 10 mg by mouth at bedtime.      Home: Home Living Family/patient expects to be discharged to:: Private residence Living Arrangements: Alone Available  Help at Discharge: Neighbor, Available PRN/intermittently Type of Home: House Home Access: Stairs to enter [employer] of Steps: 3 Entrance Stairs-Rails: Right, Left Home Layout: Two level, Able to live on main level with bedroom/bathroom, Bed/bath upstairs Alternate Level Stairs-Number of Steps: 7 Alternate Level Stairs-Rails: Left Home Equipment: Other (comment), Bedside  commode, Toilet riser (White cane) Lives With: Alone  Functional History: Prior Function Level of Independence: Independent Comments: [facility] and received for a white cane about 2 weeks ago. Also received brail stickers for microwave. Pt has a small dog and reports that the dog is the same color as her hardwood floors and amy need (A) taking care of her. Functional Status:  Mobility: Bed Mobility Overal bed mobility: Needs Assistance Bed Mobility: Supine to Sit Supine to sit: Supervision General bed mobility comments: Supervision for safety Transfers Overall transfer level: Needs assistance Equipment used: None Transfers: Sit to/from Stand Sit to Stand: Min guard General transfer comment: Min guard (A) for balance and safety. Pt reaching for objects to hold onto immediately upon standing. Ambulation/Gait Ambulation/Gait assistance: Min assist Ambulation Distance (Feet): 120 Feet Assistive device: 1 person hand held assist Gait Pattern/deviations: Step-through pattern, Decreased stride length Gait velocity interpretation: Below normal speed for age/gender General Gait Details: Min A for balance with gait. Patient with lateral LOB x2 with min A to correct. Cues to increase step length and to manuver through tight spaces.     ADL: ADL Overall ADL's : Needs assistance/impaired Grooming: Brushing hair, Min guard, Standing Toilet Transfer: Min guard, Ambulation, Regular Toilet, Grab bars Toilet Transfer Details (indicate cue type and reason): Min guard (A) for safety and balance. Toileting- Water quality scientist and Hygiene: Min guard, Sitting/lateral lean Functional mobility during ADLs: Min guard General ADL Comments: Pt with several small LOB during session. Pt reaching for surfaces (door handle, counters, grab bars) to hold onto while ambulating to and from bathroom. Discussed environmental and safety precautions and modifications for home.  Pt seemed aggreable to having HHOT come out and assess/help make adaptation fors pt's low vision.  Cognition: Cognition Overall Cognitive Status: Impaired/Different from baseline Arousal/Alertness: Awake/alert Orientation Level: Oriented X4 Attention: Sustained Sustained Attention: Appears intact Memory: Impaired Memory Impairment: Storage deficit, Decreased recall of new information Awareness: Appears intact Problem Solving: Appears intact Safety/Judgment: Appears intact Cognition Arousal/Alertness: Awake/alert Behavior During Therapy: WFL for tasks assessed/performed Overall Cognitive Status: Impaired/Different from baseline  Blood pressure 165/62, pulse 66, temperature 97.8 F (36.6 C), temperature source Oral, resp. rate 16, SpO2 98 %. Physical Exam  Constitutional: She is oriented to person, place, and time.  HENT:  Head: Normocephalic.  Eyes:  Pupils sluggish but reactive to light  Neck: Normal range of motion. Neck supple. No thyromegaly present.  Cardiovascular: Normal rate and regular rhythm.  Respiratory: Effort normal and breath sounds normal. No respiratory distress.  GI: Soft. Bowel sounds are normal. She exhibits no distension.  Neurological: She is alert and oriented to person, place, and time.  Speech is a bit dysarthric but fully intelligible. She follows commands. Right facial droop. Visual acuity poor. Sensation 1 to 1+/2 right face, arm, leg. RUE 4/5. RLE 4/5 prox to distal. Fair insight and awareness.  Skin: Skin is warm and dry.  Psychiatric:  Anxious,     Lab Results Last 24 Hours    No results found for this or any previous visit (from the past 24 hour(s)).    Imaging Results (Last 48 hours)  No results found.    Assessment/Plan: Diagnosis: left basal ganglia infarct 1. Does the need for close, 24 hr/day medical supervision in concert with the patient's rehab needs make it unreasonable for this patient to be served in a less intensive  setting? Yes 2. Co-Morbidities requiring supervision/potential complications: essential htn, anxiety, reactive depression 3. Due to bladder management, bowel management, safety, skin/wound care, disease management, medication administration, pain management and patient education, does the patient require 24 hr/day rehab nursing? Yes 4. Does the patient require coordinated care of a physician, rehab nurse, PT (1-2 hrs/day, 5 days/week), OT (1-2 hrs/day, 5 days/week) and SLP (1-2 hrs/day, 5 days/week) to address physical and functional deficits in the context of the above medical diagnosis(es)? Yes Addressing deficits in the following areas: balance, endurance, locomotion, strength, transferring, bowel/bladder control, bathing, dressing, feeding, grooming, toileting, speech and psychosocial support 5. Can the patient actively participate in an intensive therapy program of at least 3 hrs of therapy per day at least 5 days per week? Yes 6. The potential for patient to make measurable gains while on inpatient rehab is excellent 7. Anticipated functional outcomes upon discharge from inpatient rehab are modified independent with PT, modified independent with OT, modified independent with SLP. 8. Estimated rehab length of stay to reach the above functional goals is: 7-8 9. Does the patient have adequate social supports and living environment to accommodate these discharge functional goals? Yes, potentially 10. Anticipated D/C setting: Home 11. Anticipated post D/C treatments: [place] therapy 12. Overall Rehab/Functional Prognosis: excellent  RECOMMENDATIONS: This patient's condition is appropriate for continued rehabilitative care in the following setting: CIR Patient has agreed to participate in recommended program. Yes Note that insurance prior authorization may be required for reimbursement for recommended care.  Comment: Rehab Admissions Coordinator to follow up.  Thanks,  Meredith Staggers, MD,  Mellody Drown     02/06/2014

## 2014-02-07 NOTE — PMR Pre-admission (Signed)
PMR Admission Coordinator Pre-Admission Assessment  Patient: Amanda Cochran is an 80 y.o., female MRN: [id number] DOB: 1928/05/02 Height:   Weight:                Insurance Information HMO:     PPO:       PCP:       IPA:       80/20:       OTHER:   PRIMARY: Medicare A/B      Policy#: [id number]      Subscriber: [name] CM Name:        Phone#:       Fax#:   Pre-Cert#:        Employer: Retired Benefits:  Phone #:       Name: Checked in [place]. Date: 08/24/95     Deduct: $1260      Out of Pocket Max: none      Life Max: unlimited CIR: 100%      SNF: 100 days Outpatient: 80%     Co-Pay: 20% Home Health: 100%      Co-Pay: none DME: 80%     Co-Pay: 20% Providers: patient's choice  SECONDARY: [employer] of [place] sup      Policy#: [id number]      Subscriber: [name] CM Name:        Phone#:       Fax#:   Pre-Cert#:        Employer: Retired Benefits:  Phone #: [phone]     Name:   Eff. Date:       Deduct:        Out of Pocket Max:        Life Max:   CIR:        SNF:   Outpatient:       Co-Pay:   Home Health:        Co-Pay:   DME:       Co-Pay:     Emergency Contact Information Contact Information    Name Relation Home Work Mobile   Harmon,Chris Nephew   [place] Other [place] [phone]       Current Medical History  Patient Admitting Diagnosis:  L BG infarct  History of Present Illness: An 80 y.o. right handed female with history of hypertension and previous strokes with little residual weakness maintained on aspirin 325 mg daily and severe glaucoma with limited vision. Lives alone and uses a cane. She presented 02/03/2014 with slurred speech and right facial droop. MRI showed acute left basal ganglia infarct as well as moderate chronic small vessel ischemic disease. MRA with no evidence of major intracranial occlusion or stenosis. Patient did not receive TPA. Echocardiogram with ejection fraction of 40% without embolism.  Carotid Doppler no ICA stenosis. Neurology services consulted presently on aspirin 81 mg daily as well as Aggrenox. Dysphagia 3 nectar thick liquid diet. Subcutaneous heparin has been added for DVT prophylaxis. Physical therapy evaluation completed 02/04/2014 with recommendations of physical medicine rehabilitation consult.  Patient to be admitted for comprehensive inpatient rehabilitation program.    Total: 2=NIH  Past Medical History  Past Medical History  Diagnosis Date  . Thyroid disease   . Hyperlipidemia   . Hypertension   . Glaucoma   . Arthritis   . Bladder infection     Family History  family history is not on file.  Prior Rehab/Hospitalizations:  No previous rehab   Current Medications  Current facility-administered medications:  stroke: mapping our early stages of recovery book, , Does not apply, Once, Charlynne Cousins, MD;  acetaminophen (TYLENOL) tablet 650 mg, 650 mg, Oral, Q6H PRN, Lacy Duverney, PA-C, 650 mg at 02/05/14 2131;  aspirin EC tablet 81 mg, 81 mg, Oral, Daily, David L Rinehuls, PA-C, 81 mg at 02/06/14 4166;  benazepril (LOTENSIN) tablet 40 mg, 40 mg, Oral, Daily, Eugenie Filler, MD, 40 mg at 02/06/14 0958 dipyridamole-aspirin (AGGRENOX) 200-25 MG per 12 hr capsule 1 capsule, 1 capsule, Oral, QPC supper, David L Rinehuls, PA-C, 1 capsule at 02/06/14 1643;  dorzolamide-timolol (COSOPT) 22.3-6.8 MG/ML ophthalmic solution 1 drop, 1 drop, Both Eyes, BID, Charlynne Cousins, MD, 1 drop at 02/06/14 2141;  escitalopram (LEXAPRO) tablet 10 mg, 10 mg, Oral, QHS, Charlynne Cousins, MD, 10 mg at 02/06/14 2136 heparin injection 5,000 Units, 5,000 Units, Subcutaneous, 3 times per day, Charlynne Cousins, MD, 5,000 Units at 02/07/14 0536;  hydrALAZINE (APRESOLINE) injection 5 mg, 5 mg, Intravenous, Q6H PRN, Charlynne Cousins, MD, 5 mg at 02/04/14 0625;  latanoprost (XALATAN) 0.005 % ophthalmic solution 1 drop, 1 drop, Both Eyes, QHS, Charlynne Cousins, MD, 1 drop at  02/06/14 2141 levothyroxine (SYNTHROID, LEVOTHROID) tablet 100 mcg, 100 mcg, Oral, QAC breakfast, Donne Hazel, MD, 100 mcg at 02/07/14 0823;  naphazoline-pheniramine (NAPHCON-A) 0.025-0.3 % ophthalmic solution 2 drop, 2 drop, Both Eyes, QID PRN, Donne Hazel, MD;  ondansetron [facility]) injection 4 mg, 4 mg, Intravenous, Q4H PRN, Lacy Duverney, PA-C, 4 mg at 02/06/14 0630 polyethylene glycol (MIRALAX / GLYCOLAX) packet 17 g, 17 g, Oral, Daily, Donne Hazel, MD, 17 g at 02/07/14 1601;  RESOURCE THICKENUP CLEAR, , Oral, PRN, Antony Contras, MD;  senna-docusate (Senokot-S) tablet 1 tablet, 1 tablet, Oral, QHS PRN, Charlynne Cousins, MD;  senna-docusate (Senokot-S) tablet 1 tablet, 1 tablet, Oral, BID, Donne Hazel, MD, 1 tablet at 02/07/14 0932 simvastatin (ZOCOR) tablet 40 mg, 40 mg, Oral, q1800, Early Chars Rinehuls, PA-C, 40 mg at 02/06/14 1644  Patients Current Diet: DIET DYS 3  Precautions / Restrictions Precautions Precautions: Fall Restrictions Weight Bearing Restrictions: No   Prior Activity Level Limited Community (1-2x/wk): Went out 1-2 X a week.  A friend drives her to grocery store and to MD appts.   Home Assistive Devices / Equipment Home Assistive Devices/Equipment: Bedside commode/3-in-1, Cane (specify quad or straight) Home Equipment: Other (comment), Bedside commode, Toilet riser (White cane)  Prior Functional Level Prior Function Level of Independence: Independent Comments: Pt has been referred to [facility] and received for a white cane about 2 weeks ago. Also received brail stickers for microwave. Pt has a small dog and reports that the dog is the same color as her hardwood floors and may need (A) taking care of her.  Current Functional Level Cognition  Arousal/Alertness: Awake/alert Overall Cognitive Status: Within Functional Limits for tasks assessed Orientation Level: Oriented X4 Attention: Sustained Sustained Attention: Appears intact Memory:  Impaired Memory Impairment: Storage deficit, Decreased recall of new information Awareness: Appears intact Problem Solving: Appears intact Safety/Judgment: Appears intact    Extremity Assessment (includes Sensation/Coordination)  Upper Extremity Assessment: Defer to OT evaluation Lower Extremity Assessment: Overall WFL for tasks assessed    ADLs  Overall ADL's : Needs assistance/impaired Grooming: Brushing hair, Min guard, Standing Toilet Transfer: Min guard, Ambulation, Regular Toilet, Grab bars Toilet Transfer Details (indicate cue type and reason): Min guard (A) for safety and balance. Toileting- Clothing Manipulation and Hygiene:  Min guard, Sitting/lateral lean Functional mobility during ADLs: Min guard General ADL Comments: Pt with several small LOB during session. Pt reaching for surfaces (door handle, counters, grab bars) to hold onto while ambulating to and from bathroom. Discussed environmental and safety precautions and modifications for home. Pt seemed aggreable to having HHOT come out and assess/help make adaptation fors pt's low vision.    Mobility  Overal bed mobility: Needs Assistance Bed Mobility: Supine to Sit, Sit to Supine Supine to sit: Supervision Sit to supine: Supervision General bed mobility comments: Supervision for safety. Cues for positioning due to rails    Transfers  Overall transfer level: Needs assistance Equipment used: None Transfers: Sit to/from Stand Sit to Stand: Min guard General transfer comment: Min guard (A) for balance and safety. bracing of LEs on bed/chair    Ambulation / Gait / Stairs / Wheelchair Mobility  Ambulation/Gait Ambulation/Gait assistance: Museum/gallery curator (Feet): 140 Feet Assistive device: None Gait Pattern/deviations: Step-through pattern, Decreased stride length Gait velocity interpretation: Below normal speed for age/gender General Gait Details: Min A for balance with gait. Patient with increased L sway  today    Posture / Balance Berg Balance Test Sit to Stand: Able to stand  independently using hands Standing Unsupported: Able to stand 2 minutes with supervision Sitting with Back Unsupported but Feet Supported on Floor or Stool: Able to sit safely and securely 2 minutes Stand to Sit: Controls descent by using hands Transfers: Able to transfer safely, definite need of hands Standing Unsupported with Eyes Closed: Able to stand 10 seconds with supervision Standing Ubsupported with Feet Together: Able to place feet together independently but unable to hold for 30 seconds From Standing, Reach Forward with Outstretched Arm: Reaches forward but needs supervision From Standing Position, Pick up Object from Floor: Unable to pick up shoe, but reaches 2-5 cm (1-2") from shoe and balances independently From Standing Position, Turn to Look Behind Over each Shoulder: Looks behind one side only/other side shows less weight shift Turn 360 Degrees: Able to turn 360 degrees safely but slowly Standing Unsupported, Alternately Place Feet on Step/Stool: Able to complete >2 steps/needs minimal assist Standing Unsupported, One Foot in Front: Needs help to step but can hold 15 seconds Standing on One Leg: Unable to try or needs assist to prevent fall Total Score: 31    Special needs/care consideration BiPAP/CPAP No CPM No Continuous Drip IV No Dialysis No         Life Vest No Oxygen No Special Bed No Trach Size No Wound Vac (area) No       Skin No                               Bowel mgmt: Last BM 02/04/14 Bladder mgmt: Up to bathroom with assistance Diabetic mgmt No Legally Blind and HOH    Previous Home Environment Living Arrangements: Alone  Lives With: Alone Available Help at Discharge: Neighbor, Available PRN/intermittently Type of Home: House Home Layout: Two level, Able to live on main level with bedroom/bathroom, Bed/bath upstairs Alternate Level Stairs-Rails: Left Alternate Level  Stairs-Number of Steps: 7 Home Access: Stairs to enter Entrance Stairs-Rails: Right, Left Entrance Stairs-Number of Steps: 3 Bathroom Toilet: Handicapped height Home Care Services: Other (Comment) (delivered meals service)  Discharge Living Setting Plans for Discharge Living Setting: Alone, House Type of Home at Discharge: House Discharge Home Layout: Multi-level, 1/2 bath on main level, Bed/bath upstairs (Split  level home.)  Main level with living room where patient sleeps, kitchen and a 1/2 bath.  Does not go down stairs much anymore. Alternate Level Stairs-Number of Steps: 7 stpes up to BR/BR and 8 steps down to den and storage areas. Discharge Home Access: Stairs to enter Entrance Stairs-Number of Steps: 3 Does the patient have any problems obtaining your medications?: No  Social/Family/Support Systems Patient Roles: Other (Comment) (Neighbor close by.  Sister in law 150 miles away.) Contact Information: Hermenia Fiscal - neighbor Anticipated Caregiver: self Ability/Limitations of Caregiver: Neighbor assists with grocery shopping and with MD appointments Caregiver Availability: Intermittent Discharge Plan Discussed with Primary Caregiver: Yes Is Caregiver In Agreement with Plan?: Yes Does Caregiver/Family have Issues with Lodging/Transportation while Pt is in Rehab?: No  Goals/Additional Needs Patient/Family Goal for Rehab: PT/OT/ST mod I goals Expected length of stay: 7-8 days Cultural Considerations: [employer] Dietary Needs: Dys 3, nectar thick liquids Equipment Needs: TBD Pt/Family Agrees to Admission and willing to participate: Yes Program Orientation Provided & Reviewed with Pt/Caregiver Including Roles  & Responsibilities: Yes  Decrease burden of Care through IP rehab admission: N/A  Possible need for SNF placement upon discharge: Yes, if patient is not safe to go home alone with intermittent assistance.  Patient Condition: This patient's condition remains as documented in  the consult dated 02/06/14, in which the Rehabilitation Physician determined and documented that the patient's condition is appropriate for intensive rehabilitative care in an inpatient rehabilitation facility. Will admit to inpatient rehab today.  Preadmission Screen Completed By:  Retta Diones, 02/07/2014 10:29 AM ______________________________________________________________________   Discussed status with Dr. Naaman Plummer on 02/07/14 at 1028 and received telephone approval for admission today.  Admission Coordinator:  Retta Diones, time1028/Date12/16/15

## 2014-02-07 NOTE — Progress Notes (Signed)
Called to room.  Patient was sitting on commode attempting to have a bowel movement with no success.  Patient says she feels impacted.  Checked for impaction.  Large amount of hard stool noted in rectum, manually removed about 8 large balls of brown stool until only soft stool remained.  Patient tolerated fairly and had a small amount of bright red rectal bleeding after disimpaction.  Will continue to monitor.  [name], RN

## 2014-02-07 NOTE — Progress Notes (Signed)
Rehab admissions - I spoke with attending MD.  Patient clear for acute inpatient rehab admission today.  Bed available and will admit to rehab today.  Call me for questions.  #[phone]

## 2014-02-08 ENCOUNTER — Inpatient Hospital Stay (HOSPITAL_COMMUNITY): Payer: Medicare Other | Admitting: Occupational Therapy

## 2014-02-08 ENCOUNTER — Inpatient Hospital Stay (HOSPITAL_COMMUNITY): Payer: Medicare Other | Admitting: Speech Pathology

## 2014-02-08 ENCOUNTER — Inpatient Hospital Stay (HOSPITAL_COMMUNITY): Payer: Medicare Other | Admitting: Physical Therapy

## 2014-02-08 DIAGNOSIS — I6330 Cerebral infarction due to thrombosis of unspecified cerebral artery: Secondary | ICD-10-CM

## 2014-02-08 DIAGNOSIS — I69322 Dysarthria following cerebral infarction: Principal | ICD-10-CM

## 2014-02-08 DIAGNOSIS — G8190 Hemiplegia, unspecified affecting unspecified side: Secondary | ICD-10-CM

## 2014-02-08 LAB — COMPREHENSIVE METABOLIC PANEL
ALBUMIN: 3.4 g/dL — AB (ref 3.5–5.2)
ALT: 15 U/L (ref 0–35)
AST: 16 U/L (ref 0–37)
Alkaline Phosphatase: 57 U/L (ref 39–117)
Anion gap: 12 (ref 5–15)
BUN: 22 mg/dL (ref 6–23)
CHLORIDE: 102 meq/L (ref 96–112)
CO2: 27 mEq/L (ref 19–32)
Calcium: 9.2 mg/dL (ref 8.4–10.5)
Creatinine, Ser: 0.81 mg/dL (ref 0.50–1.10)
GFR calc Af Amer: 76 mL/min — ABNORMAL LOW (ref >90)
GFR, EST NON AFRICAN AMERICAN: 65 mL/min — AB (ref >90)
Glucose, Bld: 115 mg/dL — ABNORMAL HIGH (ref 70–99)
Potassium: 4.3 mEq/L (ref 3.7–5.3)
SODIUM: 141 meq/L (ref 137–147)
Total Bilirubin: 0.4 mg/dL (ref 0.3–1.2)
Total Protein: 6.8 g/dL (ref 6.0–8.3)

## 2014-02-08 LAB — CBC WITH DIFFERENTIAL/PLATELET
BASOS ABS: 0.1 10*3/uL (ref 0.0–0.1)
BASOS PCT: 1 % (ref 0–1)
Eosinophils Absolute: 0.1 10*3/uL (ref 0.0–0.7)
Eosinophils Relative: 1 % (ref 0–5)
HCT: 42.4 % (ref 36.0–46.0)
Hemoglobin: 13.7 g/dL (ref 12.0–15.0)
LYMPHS PCT: 21 % (ref 12–46)
Lymphs Abs: 2 10*3/uL (ref 0.7–4.0)
MCH: 28.5 pg (ref 26.0–34.0)
MCHC: 32.3 g/dL (ref 30.0–36.0)
MCV: 88.3 fL (ref 78.0–100.0)
Monocytes Absolute: 0.6 10*3/uL (ref 0.1–1.0)
Monocytes Relative: 6 % (ref 3–12)
NEUTROS ABS: 6.7 10*3/uL (ref 1.7–7.7)
NEUTROS PCT: 71 % (ref 43–77)
PLATELETS: 250 10*3/uL (ref 150–400)
RBC: 4.8 MIL/uL (ref 3.87–5.11)
RDW: 14.4 % (ref 11.5–15.5)
WBC: 9.5 10*3/uL (ref 4.0–10.5)

## 2014-02-08 NOTE — Care Management Note (Signed)
Inpatient Reading Individual Statement of Services  Patient Name:  Amanda Cochran  Date:  02/08/2014  Welcome to the [facility].  Our goal is to provide you with an individualized program based on your diagnosis and situation, designed to meet your specific needs.  With this comprehensive rehabilitation program, you will be expected to participate in at least 3 hours of rehabilitation therapies Monday-Friday, with modified therapy programming on the weekends.  Your rehabilitation program will include the following services:  Physical Therapy (PT), Occupational Therapy (OT), Speech Therapy (ST), 24 hour per day rehabilitation nursing, Therapeutic Recreaction (TR), Case Management (Social Worker), Rehabilitation Medicine, Nutrition Services and Pharmacy Services  Weekly team conferences will be held on Wednesday to discuss your progress.  Your Social Worker will talk with you frequently to get your input and to update you on team discussions.  Team conferences with you and your family in attendance may also be held.  Expected length of stay: 7-10 days Overall anticipated outcome: mod/i level  Depending on your progress and recovery, your program may change. Your Social Worker will coordinate services and will keep you informed of any changes. Your Social Worker's name and contact numbers are listed  below.  The following services may also be recommended but are not provided by the [facility]:  [place] will be made to provide these services after discharge if needed.  Arrangements include referral to agencies that provide these services.  Your insurance has been verified to be:  [place] Your primary doctor is:  Crist Infante  Pertinent information will be shared with your doctor and your insurance company.  Social Worker:  Ovidio Kin, [facility]  or ([phone]  Information discussed with and copy given to patient by: [name], 02/08/2014, 12:39 PM

## 2014-02-08 NOTE — Evaluation (Signed)
Physical Therapy Assessment and Plan  Patient Details  Name: Amanda Cochran MRN: [id number] Date of Birth: [birth year]  PT Diagnosis: Abnormality of gait, Hemiparesis dominant, cognitive deficits and right inattention Rehab Potential: Excellent ELOS: 7-10 days   Today's Date: 02/08/2014 PT Individual Time: 1400-1500 PT Individual Time Calculation (min): 60 min    Problem List:  Patient Active Problem List   Diagnosis Date Noted  . CVA (cerebral infarction) 02/07/2014  . Hyperlipidemia   . CVA (cerebral vascular accident) 02/03/2014  . Essential hypertension 02/03/2014  . Acute CVA (cerebrovascular accident) 02/03/2014    Past Medical History:  Past Medical History  Diagnosis Date  . Thyroid disease   . Hyperlipidemia   . Hypertension   . Glaucoma   . Arthritis   . Bladder infection    Past Surgical History:  Past Surgical History  Procedure Laterality Date  . Thyroid surgery  1976  . Tonsillectomy and adenoidectomy    . Vaginal prolapse repair      Assessment & Plan Clinical Impression: Patient is a 80 y.o. right handed female with history of hypertension and previous strokes with little residual weakness maintained on aspirin 325 mg daily and severe glaucoma with limited vision. Lives alone and uses a cane. He presented 02/03/2014 with slurred speech and right facial droop. MRI showed acute left basal ganglia infarct as well as moderate chronic small vessel ischemic disease. MRA with no evidence of major intracranial occlusion or stenosis. Patient did not receive TPA. Echocardiogram with ejection fraction of 40% without embolism. Carotid Doppler no ICA stenosis. Neurology services consulted presently on aspirin 81 mg daily as well as Aggrenox. Dysphagia 3 nectar thick liquid diet. Subcutaneous heparin has been added for DVT prophylaxis. Patient transferred to CIR on 02/07/2014 .   Patient currently requires min with mobility secondary to decreased attention to right,  decreased memory, disequilibrium (unknown origin) and decreased standing balance, decreased postural control, hemiplegia and decreased balance strategies.  Prior to hospitalization, patient was independent  with mobility and lived with Alone in a House home.  Home access is 3Stairs to enter.  Patient will benefit from skilled PT intervention to maximize safe functional mobility and minimize fall risk for planned discharge home alone vs assisted living facility, pending pt progress. Anticipate patient will benefit from follow up OP at discharge.  PT - End of Session Activity Tolerance: Endurance does not limit participation in activity Endurance Deficit: No PT Assessment Rehab Potential (ACUTE/IP ONLY): Excellent Barriers to Discharge: Decreased caregiver support Barriers to Discharge Comments: Pt lives alone with no family nearby. PT Patient demonstrates impairments in the following area(s): Balance;Motor;Perception;Safety PT Transfers Functional Problem(s): Bed Mobility;Bed to Chair;Car;Furniture;Floor PT Locomotion Functional Problem(s): Stairs;Ambulation PT Plan PT Intensity: Minimum of 1-2 x/day ,45 to 90 minutes PT Frequency: 5 out of 7 days PT Duration Estimated Length of Stay: 7-10 days PT Treatment/Interventions: Ambulation/gait training;Balance/vestibular training;Disease management/prevention;Functional mobility training;Patient/family education;Therapeutic Exercise;Visual/perceptual remediation/compensation;Therapeutic Activities;Discharge planning;DME/adaptive equipment instruction;Neuromuscular re-education;Stair training;UE/LE Strength taining/ROM PT Transfers Anticipated Outcome(s): Mod I PT Locomotion Anticipated Outcome(s): Supervision to Mod I PT Recommendation Recommendations for Other Services: Neuropsych consult Follow Up Recommendations: None;Outpatient PT Patient destination: Home (Home vs. Assisted Living) Equipment Recommended: To be determined  Skilled Therapeutic  Intervention PT evaluation performed. See below for detailed findings. Treatment initiated. Session focused on dynamic standing balance. Berg Balance Scale completed with score of 35/56. See below for detailed findings as well as description of assist/cueing required with bed mobility, w/c mobility, gait and stairs. Educated pt on  findings, goals, and plan of care. Oriented pt to rehab unit, fall precautions. Pt verbalized understanding of all education and was in full agreement with plan of care. Departed with pt supine in bed with 2 rails up, bed alarm on, and all needs within reach.  PT Evaluation Precautions/Restrictions Precautions Precautions: Fall Precaution Comments: Mild R inattention Restrictions Weight Bearing Restrictions: No General   Vital Signs Pain Pain Assessment Pain Assessment: No/denies pain Home Living/Prior Functioning Home Living Living Arrangements: Alone Available Help at Discharge: Neighbor;Available PRN/intermittently Type of Home: House Home Access: Stairs to enter [employer] of Steps: 3 Entrance Stairs-Rails: Right;Left (unable to reach both; pt typially ascends/descends laterally with bilar UE support at single rail) Home Layout: Two level;Bed/bath upstairs;Other (Comment) Alternate Level Stairs-Number of Steps: 7 Alternate Level Stairs-Rails: Left  Lives With: Alone Prior Function Level of Independence: Independent with basic ADLs;Independent with homemaking with ambulation;Independent with gait  Able to Take Stairs?: Yes Driving: No Vocation: Retired Comments: [facility] and received for a white cane about 2 weeks ago. Also received Braille stickers for microwave.  Vision/Perception  Perception Comments: During stair negotiation, pt required verbal/tactile cueing to advance RUE on hand rail.  Cognition Overall Cognitive Status: Within Functional Limits for tasks assessed Arousal/Alertness:  Awake/alert Orientation Level: Oriented X4 Attention: Sustained Memory: Impaired Memory Impairment: Storage deficit;Decreased recall of new information Awareness: Appears intact Problem Solving: Appears intact Safety/Judgment: Appears intact Sensation Sensation Light Touch: Appears Intact Stereognosis: Not tested Hot/Cold: Not tested Proprioception: Appears Intact Coordination Fine Motor Movements are Fluid and Coordinated: No Finger Nose Finger Test: [facility] Heel Shin Test: Grossly WFL in bilat LE's. 9 Hole Peg Test: Rt: 1:20 and Lt: 53 seconds Motor  Motor Motor: Other (comment) Motor - Observations: R hemiparesis (weakness in RUE>LE)  Mobility Bed Mobility Bed Mobility: Supine to Sit;Sit to Supine Supine to Sit: 5: Supervision;HOB flat Sit to Supine: 5: Supervision;HOB flat Sit to Supine - Details: Verbal cues for technique Sit to Supine - Details (indicate cue type and reason): Verbal cueing for logroll technique secondary to pt c/o pain in lower back when performing seated > long sitting > supine Transfers Transfers: Yes Sit to Stand: 5: Supervision;4: Min guard;From toilet;From bed Sit to Stand Details: Verbal cues for precautions/safety Stand to Sit: 5: Supervision;4: Min guard;To toilet;To bed Stand to Sit Details (indicate cue type and reason): Verbal cues for precautions/safety Locomotion  Ambulation Ambulation: Yes Ambulation/Gait Assistance: 4: Min assist;5: Supervision;4: Min guard Ambulation Distance (Feet): 120 Feet Assistive device: Other (Comment);1 person hand held assist (R HHA for obstacle negotiation in room; no AD required in hallway) Ambulation/Gait Assistance Details: Verbal cues for precautions/safety Ambulation/Gait Assistance Details: Pt ambulated x120' in controlled environment without assistive device with close supervision to min guard, no overt LOB. Pt required min A, R HHA for obstacle negotiation on R side. Gait Gait: Yes Gait Pattern:  Impaired Gait Pattern: Step-through pattern;Lateral trunk lean to right  Trunk/Postural Assessment  Cervical Assessment Cervical Assessment: Within Functional Limits Thoracic Assessment Thoracic Assessment: Within Functional Limits Lumbar Assessment Lumbar Assessment: Within Functional Limits Postural Control Postural Control: Deficits on evaluation Righting Reactions: Significant LOB to R side with no balance recovery reactions noted.  Balance Balance Balance Assessed: Yes Standardized Balance Assessment Standardized Balance Assessment: Berg Balance Test Berg Balance Test Sit to Stand: Able to stand without using hands and stabilize independently Standing Unsupported: Able to stand 2 minutes with supervision Sitting with Back Unsupported but Feet Supported  on Floor or Stool: Able to sit safely and securely 2 minutes Stand to Sit: Sits safely with minimal use of hands Transfers: Able to transfer safely, minor use of hands Standing Unsupported with Eyes Closed: Able to stand 10 seconds with supervision Standing Ubsupported with Feet Together: Able to place feet together independently but unable to hold for 30 seconds (LOB to R side) From Standing, Reach Forward with Outstretched Arm: Reaches forward but needs supervision From Standing Position, Pick up Object from Floor: Unable to pick up shoe, but reaches 2-5 cm (1-2") from shoe and balances independently From Standing Position, Turn to Look Behind Over each Shoulder: Looks behind one side only/other side shows less weight shift Turn 360 Degrees: Able to turn 360 degrees safely but slowly Standing Unsupported, Alternately Place Feet on Step/Stool: Able to complete >2 steps/needs minimal assist Standing Unsupported, One Foot in Front: Needs help to step but can hold 15 seconds Standing on One Leg: Tries to lift leg/unable to hold 3 seconds but remains standing independently Total Score: 35 Dynamic Standing Balance Dynamic Standing -  Balance Support: No upper extremity supported Dynamic Standing - Level of Assistance: 5: Stand by assistance;4: Min assist Dynamic Standing - Comments: See Berg Balance Scale above. Extremity Assessment  RUE Assessment RUE Assessment: Within Functional Limits (ROM WFL, strength grossly 5/5) LUE Assessment LUE Assessment: Within Functional Limits (ROM WFL, strength grossly 5/5) RLE Assessment RLE Assessment: Within Functional Limits (Grossly 4/5 to 5/5) LLE Assessment LLE Assessment: Within Functional Limits  FIM:  FIM - Bed/Chair Transfer Bed/Chair Transfer: 5: Supine > Sit: Supervision (verbal cues/safety issues);5: Sit > Supine: Supervision (verbal cues/safety issues);4: Chair or W/C > Bed: Min A (steadying Pt. > 75%);4: Bed > Chair or W/C: Min A (steadying Pt. > 75%) FIM - Locomotion: Wheelchair Locomotion: Wheelchair: 0: Activity did not occur (Pt ambulatory on unit) FIM - Locomotion: Ambulation Locomotion: Ambulation Assistive Devices: Other (comment) (No AD) Ambulation/Gait Assistance: 4: Min assist;5: Supervision;4: Min guard Locomotion: Ambulation: 2: Travels 50 - 149 ft with minimal assistance (Pt.>75%) FIM - Locomotion: Stairs Locomotion: Scientist, physiological: Hand rail - 2 Locomotion: Stairs: 1: Up and Down < 4 stairs with minimal assistance (Pt.>75%)   Refer to Care Plan for Long Term Goals  Recommendations for other services: Neuropsych  Discharge Criteria: Patient will be discharged from PT if patient refuses treatment 3 consecutive times without medical reason, if treatment goals not met, if there is a change in medical status, if patient makes no progress towards goals or if patient is discharged from hospital.  The above assessment, treatment plan, treatment alternatives and goals were discussed and mutually agreed upon: by patient  Stefano Gaul 02/08/2014, 7:55 PM

## 2014-02-08 NOTE — Evaluation (Signed)
Occupational Therapy Assessment and Plan  Patient Details  Name: Amanda Cochran MRN: [id number] Date of Birth: 08-Oct-1928  OT Diagnosis: blindness and low vision, cognitive deficits and hemiplegia affecting dominant side Rehab Potential: Rehab Potential (ACUTE ONLY): Good ELOS: 8-10 days   Today's Date: 02/08/2014 OT Individual Time: 7412-8786 OT Individual Time Calculation (min): 60 min     Problem List:  Patient Active Problem List   Diagnosis Date Noted  . CVA (cerebral infarction) 02/07/2014  . Hyperlipidemia   . CVA (cerebral vascular accident) 02/03/2014  . Essential hypertension 02/03/2014  . Acute CVA (cerebrovascular accident) 02/03/2014    Past Medical History:  Past Medical History  Diagnosis Date  . Thyroid disease   . Hyperlipidemia   . Hypertension   . Glaucoma   . Arthritis   . Bladder infection    Past Surgical History:  Past Surgical History  Procedure Laterality Date  . Thyroid surgery  1976  . Tonsillectomy and adenoidectomy    . Vaginal prolapse repair      Assessment & Plan Clinical Impression: Patient is a 80 y.o. right handed female with history of hypertension and previous strokes with little residual weakness maintained on aspirin 325 mg daily and severe glaucoma with limited vision. Lives alone and uses a cane. He presented 02/03/2014 with slurred speech and right facial droop. MRI showed acute left basal ganglia infarct as well as moderate chronic small vessel ischemic disease. MRA with no evidence of major intracranial occlusion or stenosis. Patient did not receive TPA. Echocardiogram with ejection fraction of 40% without embolism. Carotid Doppler no ICA stenosis. Neurology services consulted presently on aspirin 81 mg daily as well as Aggrenox. Dysphagia 3 nectar thick liquid diet. Subcutaneous heparin has been added for DVT prophylaxis. Physical therapy evaluation completed 02/04/2014 with recommendations of physical medicine rehabilitation  consult.   Patient transferred to CIR on 02/07/2014 .    Patient currently requires min with basic self-care skills secondary to unbalanced muscle activation and decreased coordination and decreased visual acuity and low vision due to glaucoma.  Prior to hospitalization, patient could complete ADLs with modified independent .  Patient will benefit from skilled intervention to increase independence with basic self-care skills prior to discharge home independently.  Anticipate patient will require intermittent supervision and follow up home health.  OT - End of Session Activity Tolerance: Tolerates 30+ min activity without fatigue Endurance Deficit: No OT Assessment Rehab Potential (ACUTE ONLY): Good Barriers to Discharge: Decreased caregiver support;Other (comment) Barriers to Discharge Comments: visual impairments OT Patient demonstrates impairments in the following area(s): Balance;Motor;Safety;Vision OT Basic ADL's Functional Problem(s): Grooming;Bathing;Dressing;Toileting OT Advanced ADL's Functional Problem(s): Simple Meal Preparation OT Transfers Functional Problem(s): Toilet;Tub/Shower OT Plan OT Intensity: Minimum of 1-2 x/day, 45 to 90 minutes OT Frequency: 5 out of 7 days OT Duration/Estimated Length of Stay: 8-10 days OT Treatment/Interventions: Balance/vestibular training;Discharge planning;Disease Lawyer;Functional mobility training;Patient/family education;Psychosocial support;Self Care/advanced ADL retraining;Therapeutic Activities;Therapeutic Exercise;UE/LE Coordination activities OT Self Feeding Anticipated Outcome(s): mod I OT Basic Self-Care Anticipated Outcome(s): mod I OT Toileting Anticipated Outcome(s): mod I OT Bathroom Transfers Anticipated Outcome(s): mod I OT Recommendation Recommendations for Other Services: Neuropsych consult Patient destination: Home Follow Up Recommendations: Home health OT Equipment  Recommended: Tub/shower bench;To be determined   Skilled Therapeutic Intervention OT eval initiated with education on rehab process, purpose of OT, and goals. Engaged in ADL assessment at sit > stand level in room shower.  Pt required min/HHA with ambulation to toilet and then walk-in shower secondary  to low vision and reported weakness on Rt side.  Pt supervision with bathing at sit > stand level, compensating for vision quite well.  Pt with no clothes on eval, however donned underwear and socks with setup assist.  9 hole peg test yielding decreased coordination and speed RUE with Rt: 1:20 and Lt: 53 seconds, visual impairment also impacting this task.  Grip strength Rt: 35# and Lt: 40#.    OT Evaluation Precautions/Restrictions  Precautions Precautions: Fall Restrictions Weight Bearing Restrictions: No Pain Pain Assessment Pain Assessment: No/denies pain Home Living/Prior Functioning Home Living Family/patient expects to be discharged to:: Private residence Living Arrangements: Alone Available Help at Discharge: Neighbor, Available PRN/intermittently Type of Home: House Home Access: Stairs to enter [employer] of Steps: 3 Entrance Stairs-Rails: Right, Left Home Layout: Two level, Bed/bath upstairs, Other (Comment) (states that she can sleep in the "formal sitting room" on the main floor, but there is only a half bath on the main floor) Alternate Level Stairs-Number of Steps: 7 Alternate Level Stairs-Rails: Left  Lives With: Alone Prior Function Level of Independence: Independent with basic ADLs, Independent with homemaking with ambulation, Independent with gait  Able to Take Stairs?: Yes Driving: No Vocation: Retired Comments: [facility] and received for a white cane about 2 weeks ago. Also received Braille stickers for microwave.  ADL  See FIM Vision/Perception  Vision- History Baseline Vision/History: Wears glasses Patient  Visual Report: Central vision impairment;Other (comment);Blurring of vision Vision- Assessment Vision Assessment?: Vision impaired- to be further tested in functional context Additional Comments: Pt with low vision due to glaucoma, formal visual assessments were unsuccessful.  Pt reports things are more blurry and require increased focuse to see  Cognition Overall Cognitive Status: Within Functional Limits for tasks assessed Arousal/Alertness: Awake/alert Orientation Level: Oriented X4 Attention: Sustained Memory: Impaired Memory Impairment: Storage deficit;Decreased recall of new information Awareness: Appears intact Problem Solving: Appears intact Safety/Judgment: Appears intact Sensation Sensation Light Touch: Appears Intact Stereognosis: Not tested Hot/Cold: Not tested Proprioception: Appears Intact Coordination Fine Motor Movements are Fluid and Coordinated: No Finger Nose Finger Test: [facility] 9 Hole Peg Test: Rt: 1:20 and Lt: 53 seconds Extremity/Trunk Assessment RUE Assessment RUE Assessment: Within Functional Limits (ROM WFL, strength grossly 5/5) LUE Assessment LUE Assessment: Within Functional Limits (ROM WFL, strength grossly 5/5)  FIM:  FIM - Bathing Bathing Steps Patient Completed: Chest;Right Arm;Left Arm;Abdomen;Front perineal area;Buttocks;Right upper leg;Left upper leg;Right lower leg (including foot);Left lower leg (including foot) Bathing: 5: Supervision: Safety issues/verbal cues FIM - Upper Body Dressing/Undressing Upper body dressing/undressing: 0: Wears gown/pajamas-no public clothing FIM - Lower Body Dressing/Undressing Lower body dressing/undressing steps patient completed: Thread/unthread right underwear leg;Thread/unthread left underwear leg;Pull underwear up/down;Don/Doff right sock;Don/Doff left sock Lower body dressing/undressing: 5: Set-up assist to: Obtain clothing FIM - Toileting Toileting steps completed by patient: Performs perineal  hygiene;Adjust clothing after toileting;Adjust clothing prior to toileting Toileting Assistive Devices: Grab bar or rail for support Toileting: 4: Steadying assist FIM - Control and instrumentation engineer Devices: HOB elevated Bed/Chair Transfer: 5: Supine > Sit: Supervision (verbal cues/safety issues);4: Bed > Chair or W/C: Min A (steadying Pt. > 75%);4: Chair or W/C > Bed: Min A (steadying Pt. > 75%) FIM - Radio producer Devices: Elevated toilet seat;Grab bars Toilet Transfers: 4-To toilet/BSC: Min A (steadying Pt. > 75%);4-From toilet/BSC: Min A (steadying Pt. > 75%) FIM - Systems developer Devices: Shower chair;Grab bars;Walk in shower Tub/shower Transfers: 4-Into Tub/Shower: Min  A (steadying Pt. > 75%/lift 1 leg);4-Out of Tub/Shower: Min A (steadying Pt. > 75%/lift 1 leg)   Refer to Care Plan for Long Term Goals  Recommendations for other services: Neuropsych  Discharge Criteria: Patient will be discharged from OT if patient refuses treatment 3 consecutive times without medical reason, if treatment goals not met, if there is a change in medical status, if patient makes no progress towards goals or if patient is discharged from hospital.  The above assessment, treatment plan, treatment alternatives and goals were discussed and mutually agreed upon: by patient  Simonne Come 02/08/2014, 12:26 PM

## 2014-02-08 NOTE — Progress Notes (Addendum)
80 y.o. right handed female with history of hypertension and previous strokes with little residual weakness maintained on aspirin 325 mg daily and severe glaucoma with limited vision. Lives alone and uses a cane. He presented 02/03/2014 with slurred speech and right facial droop. MRI showed acute left basal ganglia infarct as well as moderate chronic small vessel ischemic disease. MRA with no evidence of major intracranial occlusion or stenosis. Patient did not receive TPA. Echocardiogram with ejection fraction of 40% without embolism. Carotid Doppler no ICA stenosis. Neurology services consulted presently on aspirin 81 mg daily as well as Aggrenox. Dysphagia 3 nectar thick liquid diet  Subjective/Complaints: Felt off balance when getting up to go to BR this am, no increased weakness in arm or leg, vision feels funny  Review of Systems - Negative except as above  Objective: Vital Signs: Blood pressure 168/64, pulse 77, temperature 97.9 F (36.6 C), temperature source Oral, resp. rate 18, height $RemoveBe'5\' 6"'kBrAefHir$  (1.676 m), weight 69.9 kg (154 lb 1.6 oz), SpO2 95 %. No results found. Results for orders placed or performed during the hospital encounter of 02/07/14 (from the past 72 hour(s))  CBC     Status: Abnormal   Collection Time: 02/07/14  3:30 PM  Result Value Ref Range   WBC 13.4 (H) 4.0 - 10.5 K/uL   RBC 5.06 3.87 - 5.11 MIL/uL   Hemoglobin 15.1 (H) 12.0 - 15.0 g/dL   HCT 45.0 36.0 - 46.0 %   MCV 88.9 78.0 - 100.0 fL   MCH 29.8 26.0 - 34.0 pg   MCHC 33.6 30.0 - 36.0 g/dL   RDW 14.2 11.5 - 15.5 %   Platelets 272 150 - 400 K/uL  Creatinine, serum     Status: Abnormal   Collection Time: 02/07/14  3:30 PM  Result Value Ref Range   Creatinine, Ser 0.77 0.50 - 1.10 mg/dL   GFR calc non Af Amer 76 (L) >90 mL/min   GFR calc Af Amer 88 (L) >90 mL/min    Comment: (NOTE) The eGFR has been calculated using the CKD EPI equation. This calculation has not been validated in all clinical  situations. eGFR's persistently <90 mL/min signify possible Chronic Kidney Disease.      HEENT: normal Cardio: irregular and no murmur or gallop Resp: CTA B/L and unlabored GI: BS positive and NT, ND Extremity:  Pulses positive and No Edema Skin:   Intact Neuro: Alert/Oriented, Cranial Nerve II-XII normal, Normal Sensory, Abnormal Motor 4/5 in R delt, bi, tri, grip, HF, KE, ADF, Abnormal FMC Ataxic/ dec FMC and Other cerebellar FNF intact.  Visual fields intact , EOMI, neg nystagmus Musc/Skel:  Normal Gen NAD   Assessment/Plan: 1. Functional deficits secondary to Left basal ganglia infarct which require 3+ hours per day of interdisciplinary therapy in a comprehensive inpatient rehab setting. Physiatrist is providing close team supervision and 24 hour management of active medical problems listed below. Physiatrist and rehab team continue to assess barriers to discharge/monitor patient progress toward functional and medical goals. FIM:                   Comprehension Comprehension Mode: Auditory Comprehension: 6-Follows complex conversation/direction: With extra time/assistive device  Expression Expression Mode: Verbal Expression: 6-Expresses complex ideas: With extra time/assistive device  Social Interaction Social Interaction: 7-Interacts appropriately with others - No medications needed.  Problem Solving Problem Solving: 5-Solves basic problems: With no assist  Memory Memory: 6-More than reasonable amt of time   Medical Problem List and Plan:  1. Functional deficits secondary to left basal ganglia infarct secondary to small vessel disease 2.  DVT Prophylaxis/Anticoagulation: Subcutaneous heparin. Monitor platelet counts and any signs of bleeding 3. Pain Management: Tylenol as needed 4. Dysphagia. Dysphagia 3 and liquids. Monitor for any signs of aspiration. Follow-up speech therapy 5. Neuropsych: This patient is capable of making decisions on his own  behalf. 6. Skin/Wound Care: Routine skin checks 7. Fluids/Electrolytes/Nutrition: Strict I and O's. Follow-up chemistries 8. Hypertension. Lotensin 40 mg daily. Monitor with increased mobility 9. Mood/depression. Lexapro 10 mg daily. Provide emotional support 10. Glaucoma. Continue eyedrops as advised 11. Hypothyroidism. Synthroid 12. Hyperlipidemia. Zocor 13.  Arrythmia last 2 EKG show freq PVC, no rx, no Afib LOS (Days) 1 A FACE TO FACE EVALUATION WAS PERFORMED  KIRSTEINS,ANDREW E 02/08/2014, 6:53 AM

## 2014-02-08 NOTE — Progress Notes (Signed)
Patient information reviewed and entered into eRehab system by  , RN, CRRN, PPS Coordinator.  Information including medical coding and functional independence measure will be reviewed and updated through discharge.     Per nursing patient was given "Data Collection Information Summary for Patients in Inpatient Rehabilitation Facilities with attached "Privacy Act Statement-Health Care Records" upon admission.  

## 2014-02-08 NOTE — Evaluation (Signed)
Speech Language Pathology Assessment and Plan  Patient Details  Name: Amanda Cochran MRN: [id number] Date of Birth: [birth year]  SLP Diagnosis: Dysarthria;Dysphagia;Cognitive Impairments  Rehab Potential: Good ELOS: 7-10 days     Today's Date: 02/08/2014 SLP Individual Time: 4967-5916 SLP Individual Time Calculation (min): 60 min   Problem List:  Patient Active Problem List   Diagnosis Date Noted  . CVA (cerebral infarction) 02/07/2014  . Hyperlipidemia   . CVA (cerebral vascular accident) 02/03/2014  . Essential hypertension 02/03/2014  . Acute CVA (cerebrovascular accident) 02/03/2014   Past Medical History:  Past Medical History  Diagnosis Date  . Thyroid disease   . Hyperlipidemia   . Hypertension   . Glaucoma   . Arthritis   . Bladder infection    Past Surgical History:  Past Surgical History  Procedure Laterality Date  . Thyroid surgery  1976  . Tonsillectomy and adenoidectomy    . Vaginal prolapse repair      Assessment / Plan / Recommendation Clinical Impression   Amanda Cochran is a 80 y.o. right handed female with history of hypertension and previous strokes with little residual weakness  Lives alone and uses a cane. She presented 02/03/2014 with slurred speech and right facial droop. MRI showed acute left basal ganglia infarct as well as moderate chronic small vessel ischemic disease.  Dysphagia 3 nectar thick liquid diet per MBS. Patient was admitted for comprehensive rehabilitation program 02/07/2014.  SLP evaluation completed on 02/08/2014 with the following results:  Pt presents with s/s of a mild oropharyngeal dysphagia with both motor and suspected sensory components.  Pt presents with mild right sided labial weakness resulting in decreased labial seal and anterior loss of liquid consistencies.  No overt s/s of aspiration were noted with solid or liquid consistencies; however, pt reported poor control of thin liquids when swallowing which SLP suspects  correlates with premature spillage and delayed swallow initiation with thins noted on most recent MBS.  Additionally, pt reports sensation of increased pharyngeal residue with regular solid consistencies; therefore, recommend that pt continue on dys 3 solids with nectar thick liquids and full supervision for use of swallowing precautions (chin tuck per MBS, slow rate, small bites/sips, intermittent throat clear).   Additionally, pt presents with mild dysarthria characterized by decreased vocal intensity and imprecise articulation of consonants due to right sided weakness.  No formal cognitive assessment was completed on this date; however, pt presents with decreased recall of new information. SLP will follow up for a more thorough evaluation of cognitive function.   Pt would benefit from skilled ST while inpatient in order to maximize functional independence and reduce burden of care prior to discharge.  Anticipate that pt would benefit from ST follow up at discharge in either the home health or outpatient setting.    Skilled Therapeutic Interventions          Cognitive-linguistic and bedside swallowing evaluation completed with results and recommendations reviewed with patient.     SLP Assessment  Patient will need skilled Speech Lanaguage Pathology Services during CIR admission    Recommendations  Diet Recommendations: Dysphagia 3 (Mechanical Soft);Nectar-thick liquid Liquid Administration via: Cup;Straw;Spoon Medication Administration: Crushed with puree Supervision: Patient able to self feed;Full supervision/cueing for compensatory strategies Compensations: Slow rate;Small sips/bites;Clear throat intermittently;Effortful swallow;Multiple dry swallows after each bite/sip Postural Changes and/or Swallow Maneuvers: Seated upright 90 degrees;Upright 30-60 min after meal;Chin tuck Oral Care Recommendations: Oral care BID Patient destination:  (TBD home versus assisted living facility ) Follow up  Recommendations: Home Health SLP Equipment Recommended: None recommended by SLP    SLP Frequency 5 out of 7 days   SLP Treatment/Interventions Cognitive remediation/compensation;Cueing hierarchy;Functional tasks;Patient/family education;Oral motor exercises;Internal/external aids;Environmental controls    Pain Pain Assessment Pain Assessment: No/denies pain Prior Functioning Cognitive/Linguistic Baseline: Baseline deficits Baseline deficit details: mild baseline memory deficits  Type of Home: House  Lives With: Alone Available Help at Discharge: Available PRN/intermittently;Neighbor 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  SLP Short Term Goal 3 (Week 1): Pt will return demonstration of how to thicken liquids with Mod I  SLP Short Term Goal 4 (Week 1): Pt will improve recall of daily information via use of external aids over 80% of observable opportunities with Mod I  SLP Short Term Goal 5 (Week 1): Pt will improve speech intelligibility at the conversational level to >80% via compensatory strategies with Mod I.    See FIM for current functional status Refer to Care Plan for Long Term Goals  Recommendations for other services: None  Discharge Criteria: Patient will be discharged from SLP if patient refuses treatment 3 consecutive times without medical reason, if treatment goals not met, if there is a change in medical status, if patient makes no progress towards goals or if patient is discharged from hospital.  The above assessment, treatment plan, treatment alternatives and goals were discussed and mutually  agreed upon: by patient   Windell Moulding, M.A. CCC-SLP  , Selinda Orion 02/08/2014, 4:50 PM

## 2014-02-08 NOTE — Progress Notes (Signed)
Social Work Assessment and Plan Social Work Assessment and Plan  Patient Details  Name: Amanda Cochran MRN: [id number] Date of Birth: 1928-09-16  Today's Date: 02/08/2014  Problem List:  Patient Active Problem List   Diagnosis Date Noted  . CVA (cerebral infarction) 02/07/2014  . Hyperlipidemia   . CVA (cerebral vascular accident) 02/03/2014  . Essential hypertension 02/03/2014  . Acute CVA (cerebrovascular accident) 02/03/2014   Past Medical History:  Past Medical History  Diagnosis Date  . Thyroid disease   . Hyperlipidemia   . Hypertension   . Glaucoma   . Arthritis   . Bladder infection    Past Surgical History:  Past Surgical History  Procedure Laterality Date  . Thyroid surgery  1976  . Tonsillectomy and adenoidectomy    . Vaginal prolapse repair     Social History:  reports that she has never smoked. She does not have any smokeless tobacco history on file. She reports that she does not drink alcohol or use illicit drugs.  Family / Support Systems Marital Status: Widow/Widower How Long?: 2.9 years Patient Roles: Other (Comment) (Neighbor/ Aunt) Other Supports: Elwin Mocha  [phone]    [place]  [phone] Anticipated Caregiver: Unsure at this time Ability/Limitations of Caregiver: Only has intermittent assist-neighbor has back issues does a lot for her already Caregiver Availability: Intermittent Family Dynamics: Pt cared for husband until he died 2.9 years ago from [place] and they had no children.  She has nephews one in [place] and one in [place].  Her neighbor is the closest to her and provide assist prior to admission with transportation  Social History Preferred language: English Religion: Baptist Cultural Background: No issues Education: [employer] Read: Yes Write: Yes Employment Status: Retired Freight forwarder Issues: No issues Guardian/Conservator: POA is nephew in [place] but has not been needed, even now pt is  capable of making her own decisions according to MD while here.   Abuse/Neglect Physical Abuse: Denies Verbal Abuse: Denies Sexual Abuse: Denies Exploitation of patient/patient's resources: Denies Self-Neglect: Denies  Emotional Status Pt's affect, behavior adn adjustment status: Pt is motivated to regain as much as she can she would really like to get back her swallow back.  She wants to eat regular foods and drink thin liquids.  She has always been independent and taken care of others, she is not used to this role. Recent Psychosocial Issues: Other health issues-blindness recently got white cane from services for the blind-helps outside going to the mailbox Pyschiatric History: No issues-deferred depression screen due to pt feels she is doing well and this is not needed.  She is a realist and feels these things happen and you just need to move forward and keep on going.  Will monitor and intervene if needed. Substance Abuse History: No issues  Patient / Family Perceptions, Expectations & Goals Pt/Family understanding of illness & functional limitations: Pt has a good understanding of her stroke and deficits.  She is hopeful she will do well here.  She does talk with the MD and feels her questions are being answered. Will contact nephew since she reports he has no information. 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) Premorbid Home Care/DME Agencies: None Transportation available at discharge: [employer] referrals  recommended: Support group (specify)  Discharge Planning Living Arrangements: Alone Support Systems: Other relatives, Friends/neighbors, Church/faith community Type of  Residence: Private residence Insurance Resources: [employer], Multimedia programmer (specify) Nurse, mental health) Financial Resources: [place] Referred: No Living Expenses: Own Money Management: Patient Does the patient have any problems obtaining your medications?: No Home Management: Self Patient/Family Preliminary Plans: Return home with intermittent assist of neighbor if this is safe fot her to do.  Otherwise she will need to pursue alternative options depending upon what level she is.  She has thought about selling her home and moving to an ALF.  She may need to do this. Social Work Anticipated Follow Up Needs: HH/OP, ALF/IL, Support Group  Clinical Impression Very pleasant female who is willing to do what she can to recover from this stroke. She has been dealing with her visual issues for a while now and remained independent.  She is feeling it is getting time to begin the process Of selling her home and moving into a ALF.  She would like to return home after discharge and begin the work on this, but is open to what ever she needs to do.  Will keep nephew updated and work on a plan. Her neighbor is willing to help and has prior to admission but will see what level pt is at discharge.  [name] 02/08/2014, 2:05 PM

## 2014-02-09 ENCOUNTER — Inpatient Hospital Stay (HOSPITAL_COMMUNITY): Payer: Medicare Other | Admitting: Speech Pathology

## 2014-02-09 ENCOUNTER — Inpatient Hospital Stay (HOSPITAL_COMMUNITY): Payer: Medicare Other | Admitting: Occupational Therapy

## 2014-02-09 ENCOUNTER — Inpatient Hospital Stay (HOSPITAL_COMMUNITY): Payer: Medicare Other | Admitting: Physical Therapy

## 2014-02-09 DIAGNOSIS — G8191 Hemiplegia, unspecified affecting right dominant side: Secondary | ICD-10-CM

## 2014-02-09 HISTORY — DX: Hemiplegia, unspecified affecting right dominant side: G81.91

## 2014-02-09 NOTE — IPOC Note (Signed)
Overall Plan of Care [facility]) Patient Details Name: Amanda Cochran MRN: [id number] DOB: 1928/09/11  Admitting Diagnosis: L BG CVA  Hospital Problems: Active Problems:   CVA (cerebral infarction)   Right hemiparesis     Functional Problem List: Nursing Bowel, Endurance, Medication Management, Motor, Other (comment) (swallowing)  PT Balance, Motor, Perception, Safety  OT Balance, Motor, Safety, Vision  SLP Cognition, Nutrition  TR         Basic ADL's: OT Grooming, Bathing, Dressing, Toileting     Advanced  ADL's: OT Simple Meal Preparation     Transfers: PT Bed Mobility, Bed to Chair, Car, Furniture, Floor  OT Toilet, Tub/Shower     Locomotion: PT Stairs, Ambulation     Additional Impairments: OT    SLP Swallowing, Communication expression    TR      Anticipated Outcomes Item Anticipated Outcome  Self Feeding mod I  Swallowing  Mod I with least restrictive diet    Basic self-care  mod I  Toileting  mod I   Bathroom Transfers mod I  Bowel/Bladder  Mod I  Transfers  Mod I  Locomotion  Supervision to [employer] I  Communication  Mod I for use of swallowing precautions   Cognition     Pain  < 3  Safety/Judgment  supervision   Therapy Plan: PT Intensity: Minimum of 1-2 x/day ,45 to 90 minutes PT Frequency: 5 out of 7 days PT Duration Estimated Length of Stay: 7-10 days OT Intensity: Minimum of 1-2 x/day, 45 to 90 minutes OT Frequency: 5 out of 7 days OT Duration/Estimated Length of Stay: 8-10 days SLP Intensity: Minumum of 1-2 x/day, 30 to 90 minutes SLP Frequency: 5 out of 7 days SLP Duration/Estimated Length of Stay: 7-10 days        Team Interventions: Nursing Interventions Bowel Management, Disease Management/Prevention, Medication Management, Cognitive Remediation/Compensation, Dysphagia/Aspiration Precaution Training, Discharge Planning, Psychosocial Support  PT interventions Ambulation/gait training, Training and development officer, Disease  management/prevention, Functional mobility training, Patient/family education, Therapeutic Exercise, Visual/perceptual remediation/compensation, Therapeutic Activities, Discharge planning, DME/adaptive equipment instruction, Neuromuscular re-education, Stair training, UE/LE Strength taining/ROM  OT Interventions Balance/vestibular training, Discharge planning, Disease mangement/prevention, DME/adaptive equipment instruction, Functional mobility training, Patient/family education, Psychosocial support, Self Care/advanced ADL retraining, Therapeutic Activities, Therapeutic Exercise, UE/LE Coordination activities  SLP Interventions Cognitive remediation/compensation, Cueing hierarchy, Functional tasks, Patient/family education, Oral motor exercises, Internal/external aids, Environmental controls  TR Interventions    SW/CM Interventions Discharge Planning, Psychosocial Support, Patient/Family Education    Team Discharge Planning: Destination: PT-Home (Home vs. Assisted Living) ,OT- Home , SLP- (TBD home versus assisted living facility ) Projected Follow-up: PT-None, Outpatient PT, OT-  Home health OT, SLP-Home Health SLP Projected Equipment Needs: PT-To be determined, OT- Tub/shower bench, To be determined, SLP-None recommended by SLP Equipment Details: PT- , OT-  Patient/family involved in discharge planning: PT- Patient,  OT-Patient, SLP-Patient  MD ELOS: 8-10 days   Medical Rehab Prognosis:  Good Assessment: 80 y.o. right handed female with history of hypertension and previous strokes with little residual weakness maintained on aspirin 325 mg daily and severe glaucoma with limited vision. Lives alone and uses a cane. He presented 02/03/2014 with slurred speech and right facial droop. MRI showed acute left basal ganglia infarct as well as moderate chronic small vessel ischemic disease. MRA with no evidence of major intracranial occlusion or stenosis. Patient did not receive TPA. Echocardiogram with  ejection fraction of 40% without embolism. Carotid Doppler no ICA stenosis. Neurology services consulted presently on aspirin 81 mg  daily as well as Aggrenox.   Now requiring 24/7 Rehab RN,MD, as well as CIR level PT, OT and SLP.  Treatment team will focus on ADLs and mobility with goals set at Mod I   See Team Conference Notes for weekly updates to the plan of care

## 2014-02-09 NOTE — Progress Notes (Signed)
79 y.o. right handed female with history of hypertension and previous strokes with little residual weakness maintained on aspirin 325 mg daily and severe glaucoma with limited vision. Lives alone and uses a cane. He presented 02/03/2014 with slurred speech and right facial droop. MRI showed acute left basal ganglia infarct as well as moderate chronic small vessel ischemic disease. MRA with no evidence of major intracranial occlusion or stenosis. Patient did not receive TPA. Echocardiogram with ejection fraction of 40% without embolism. Carotid Doppler no ICA stenosis. Neurology services consulted presently on aspirin 81 mg daily as well as Aggrenox. Dysphagia 3 nectar thick liquid diet  Subjective/Complaints: Feels tired this am, "I think I did well in therapy yesterday" Reviewed nl bloodwork with pt Review of Systems - Negative except as above  Objective: Vital Signs: Blood pressure 158/84, pulse 69, temperature 98 F (36.7 C), temperature source Oral, resp. rate 17, height 5' 6" (1.676 m), weight 69.9 kg (154 lb 1.6 oz), SpO2 94 %. No results found. Results for orders placed or performed during the hospital encounter of 02/07/14 (from the past 72 hour(s))  CBC     Status: Abnormal   Collection Time: 02/07/14  3:30 PM  Result Value Ref Range   WBC 13.4 (H) 4.0 - 10.5 K/uL   RBC 5.06 3.87 - 5.11 MIL/uL   Hemoglobin 15.1 (H) 12.0 - 15.0 g/dL   HCT 45.0 36.0 - 46.0 %   MCV 88.9 78.0 - 100.0 fL   MCH 29.8 26.0 - 34.0 pg   MCHC 33.6 30.0 - 36.0 g/dL   RDW 14.2 11.5 - 15.5 %   Platelets 272 150 - 400 K/uL  Creatinine, serum     Status: Abnormal   Collection Time: 02/07/14  3:30 PM  Result Value Ref Range   Creatinine, Ser 0.77 0.50 - 1.10 mg/dL   GFR calc non Af Amer 76 (L) >90 mL/min   GFR calc Af Amer 88 (L) >90 mL/min    Comment: (NOTE) The eGFR has been calculated using the CKD EPI equation. This calculation has not been validated in all clinical situations. eGFR's persistently <90  mL/min signify possible Chronic Kidney Disease.   CBC WITH DIFFERENTIAL     Status: None   Collection Time: 02/08/14  6:34 AM  Result Value Ref Range   WBC 9.5 4.0 - 10.5 K/uL   RBC 4.80 3.87 - 5.11 MIL/uL   Hemoglobin 13.7 12.0 - 15.0 g/dL   HCT 42.4 36.0 - 46.0 %   MCV 88.3 78.0 - 100.0 fL   MCH 28.5 26.0 - 34.0 pg   MCHC 32.3 30.0 - 36.0 g/dL   RDW 14.4 11.5 - 15.5 %   Platelets 250 150 - 400 K/uL   Neutrophils Relative % 71 43 - 77 %   Neutro Abs 6.7 1.7 - 7.7 K/uL   Lymphocytes Relative 21 12 - 46 %   Lymphs Abs 2.0 0.7 - 4.0 K/uL   Monocytes Relative 6 3 - 12 %   Monocytes Absolute 0.6 0.1 - 1.0 K/uL   Eosinophils Relative 1 0 - 5 %   Eosinophils Absolute 0.1 0.0 - 0.7 K/uL   Basophils Relative 1 0 - 1 %   Basophils Absolute 0.1 0.0 - 0.1 K/uL  Comprehensive metabolic panel     Status: Abnormal   Collection Time: 02/08/14  6:34 AM  Result Value Ref Range   Sodium 141 137 - 147 mEq/L   Potassium 4.3 3.7 - 5.3 mEq/L   Chloride  102 96 - 112 mEq/L   CO2 27 19 - 32 mEq/L   Glucose, Bld 115 (H) 70 - 99 mg/dL   BUN 22 6 - 23 mg/dL   Creatinine, Ser 0.81 0.50 - 1.10 mg/dL   Calcium 9.2 8.4 - 10.5 mg/dL   Total Protein 6.8 6.0 - 8.3 g/dL   Albumin 3.4 (L) 3.5 - 5.2 g/dL   AST 16 0 - 37 U/L   ALT 15 0 - 35 U/L   Alkaline Phosphatase 57 39 - 117 U/L   Total Bilirubin 0.4 0.3 - 1.2 mg/dL   GFR calc non Af Amer 65 (L) >90 mL/min   GFR calc Af Amer 76 (L) >90 mL/min    Comment: (NOTE) The eGFR has been calculated using the CKD EPI equation. This calculation has not been validated in all clinical situations. eGFR's persistently <90 mL/min signify possible Chronic Kidney Disease.    Anion gap 12 5 - 15     HEENT: normal Cardio: irregular and no murmur or gallop Resp: CTA B/L and unlabored GI: BS positive and NT, ND Extremity:  Pulses positive and No Edema Skin:   Intact Neuro: Alert/Oriented, Cranial Nerve II-XII normal, Normal Sensory, Abnormal Motor 4+/5 in R delt,  bi, tri, grip, HF, KE, ADF, Abnormal FMC Ataxic/ dec FMC and Other cerebellar FNF intact.  Visual fields intact , EOMI, neg nystagmus Musc/Skel:  Normal Gen NAD   Assessment/Plan: 1. Functional deficits secondary to Left basal ganglia infarct which require 3+ hours per day of interdisciplinary therapy in a comprehensive inpatient rehab setting. Physiatrist is providing close team supervision and 24 hour management of active medical problems listed below. Physiatrist and rehab team continue to assess barriers to discharge/monitor patient progress toward functional and medical goals. FIM: FIM - Bathing Bathing Steps Patient Completed: Chest, Right Arm, Left Arm, Abdomen, Front perineal area, Buttocks, Right upper leg, Left upper leg, Right lower leg (including foot), Left lower leg (including foot) Bathing: 5: Supervision: Safety issues/verbal cues  FIM - Upper Body Dressing/Undressing Upper body dressing/undressing: 0: Wears gown/pajamas-no public clothing FIM - Lower Body Dressing/Undressing Lower body dressing/undressing steps patient completed: Thread/unthread right underwear leg, Thread/unthread left underwear leg, Pull underwear up/down, Don/Doff right sock, Don/Doff left sock Lower body dressing/undressing: 5: Set-up assist to: Obtain clothing  FIM - Toileting Toileting steps completed by patient: Performs perineal hygiene, Adjust clothing after toileting, Adjust clothing prior to toileting Toileting Assistive Devices: Grab bar or rail for support Toileting: 4: Steadying assist  FIM - Radio producer Devices: Elevated toilet seat, Grab bars Toilet Transfers: 4-To toilet/BSC: Min A (steadying Pt. > 75%), 4-From toilet/BSC: Min A (steadying Pt. > 75%)  FIM - Bed/Chair Transfer Bed/Chair Transfer Assistive Devices: HOB elevated Bed/Chair Transfer: 5: Supine > Sit: Supervision (verbal cues/safety issues), 5: Sit > Supine: Supervision (verbal cues/safety  issues), 4: Chair or W/C > Bed: Min A (steadying Pt. > 75%), 4: Bed > Chair or W/C: Min A (steadying Pt. > 75%)  FIM - Locomotion: Wheelchair Locomotion: Wheelchair: 0: Activity did not occur (Pt ambulatory on unit) FIM - Locomotion: Ambulation Locomotion: Ambulation Assistive Devices: Other (comment) (No AD) Ambulation/Gait Assistance: 4: Min assist, 5: Supervision, 4: Min guard Locomotion: Ambulation: 2: Travels 50 - 149 ft with minimal assistance (Pt.>75%)  Comprehension Comprehension Mode: Auditory Comprehension: 6-Follows complex conversation/direction: With extra time/assistive device  Expression Expression Mode: Verbal Expression: 5-Set-up assist with assistive device  Social Interaction Social Interaction: 6-Interacts appropriately with others with medication or  extra time (anti-anxiety, antidepressant).  Problem Solving Problem Solving: 5-Solves basic problems: With no assist  Memory Memory: 5-Recognizes or recalls 90% of the time/requires cueing < 10% of the time   Medical Problem List and Plan: 1. Functional deficits secondary to left basal ganglia infarct secondary to small vessel disease 2.  DVT Prophylaxis/Anticoagulation: Subcutaneous heparin. Monitor platelet counts and any signs of bleeding 3. Pain Management: Tylenol as needed 4. Dysphagia. Dysphagia 3 and liquids. Monitor for any signs of aspiration. Follow-up speech therapy 5. Neuropsych: This patient is capable of making decisions on his own behalf. 6. Skin/Wound Care: Routine skin checks 7. Fluids/Electrolytes/Nutrition: Strict I and O's. Follow-up chemistries 8. Hypertension. Lotensin 40 mg daily. Monitor with increased mobility 9. Mood/depression. Lexapro 10 mg daily. Provide emotional support 10. Glaucoma. Continue eyedrops as advised 11. Hypothyroidism. Synthroid 12. Hyperlipidemia. Zocor 13.  Arrythmia last 2 EKG show freq PVC, no rx, no Afib LOS (Days) 2 A FACE TO FACE EVALUATION WAS  PERFORMED  KIRSTEINS,ANDREW E 02/09/2014, 6:57 AM    

## 2014-02-09 NOTE — Progress Notes (Signed)
Speech Language Pathology Daily Session Note  Patient Details  Name: Amanda Cochran MRN: [id number] Date of Birth: 1928-08-01  Today's Date: 02/09/2014 SLP Individual Time: Session 1: 1856-3149; Session 2: 1133-1203 SLP Individual Time Calculation (min): 31 min; 30 min   Short Term Goals: Week 1: SLP Short Term Goal 1 (Week 1): Pt will tolerate presentations of her currently prescribed diet with no overt s/s of aspiration with Mod I cues for use of swallowing precautiosn  SLP Short Term Goal 2 (Week 1): Pt will tolerate trials of regular water following oral care per the water protocol with no overt s/s of aspiration with supervision cues.  SLP Short Term Goal 3 (Week 1): Pt will return demonstration of how to thicken liquids with Mod I  SLP Short Term Goal 4 (Week 1): Pt will improve recall of daily information via use of external aids over 80% of observable opportunities with Mod I  SLP Short Term Goal 5 (Week 1): Pt will improve speech intelligibility at the conversational level to >80% via compensatory strategies with Mod I.    Skilled Therapeutic Interventions:  Session 1: Pt was seen for skilled ST targeting dysphagia goals.  Upon arrival, pt was seated upright in recliner, awake, alert, and agreeable to participate in [place].  SLP completed skilled observations during presentations of pt's currently prescribed diet with pt exhibiting no overt s/s of aspiration with solid or liquid consistencies.  Pt presented with initial complaints of solid consistencies getting "stuck" in pharynx which she was able to orally expectorate.  No further complaints with SLP intervention for use of liquid wash.  Continue per current plan of care.   Session 2: Pt was seen for skilled ST targeting dysphagia goals.  Upon arrival, pt was asleep in bed but easily awakened to voice.  Pt requested to use the bathroom and benefited from overall supervision level assist for use of safety precautions to ambulated to the  bathroom.  Upon return from the bathroom, pt seated upright in recliner to maximize swallowing safety during trials of regular water via teaspoon following oral care.  No overt s/s of aspiration were noted during the abovementioned trials.  Recommend initiation of the water protocol with full supervision to continue working towards liquids advancement.  RN made aware.  Continue per current plan of care.    FIM:  Comprehension Comprehension Mode: Auditory Comprehension: 6-Follows complex conversation/direction: With extra time/assistive device Expression Expression Mode: Verbal Expression: 5-Expresses basic needs/ideas: With no assist Social Interaction Social Interaction: 6-Interacts appropriately with others with medication or extra time (anti-anxiety, antidepressant). Problem Solving Problem Solving: 5-Solves basic problems: With no assist Memory Memory: 5-Recognizes or recalls 90% of the time/requires cueing < 10% of the time FIM - Eating Eating Activity: 5: Supervision/cues  Pain Pain Assessment Pain Assessment: No/denies pain Pain Score: 0-No pain  Therapy/Group: Individual Therapy   Amanda Cochran, M.A. CCC-SLP   , Amanda Cochran 02/09/2014, 9:12 AM

## 2014-02-09 NOTE — Progress Notes (Signed)
Physical Therapy Session Note  Patient Details  Name: LATEEFAH MALLERY MRN: [id number] Date of Birth: 05/13/1928  Today's Date: 02/09/2014 PT Individual Time: 0930-1030 PT Individual Time Calculation (min): 60 min   Short Term Goals: Week 1:  PT Short Term Goal 1 (Week 1): STG's = LTG's secondary to anticipated LOS.  Skilled Therapeutic Interventions/Progress Updates:    Pt received semi reclined in bed; agreeable to therapy. Pt performed functional ambulation 2 x150' in controlled environment with close supervision to min guard, subtle to min cueing (tactile/verbal) for R-sided obstacle negotiation.  Noted the following gait deviations: narrow BOS, limited lateral weight shift to R, decreased R foot clearance (due to limited hip/knee flexion and ankle dorsiflexion) during RLE advancement, midfoot strike. See below for detailed description of NMR interventions addressing said gait deviations. Session ended in pt room, where pt was left supine in bed with 2 rails up , bed alarm on, and all needs within reach.  Therapy Documentation Precautions:  Precautions Precautions: Fall Precaution Comments: Mild R inattention Restrictions Weight Bearing Restrictions: No Vital Signs: Therapy Vitals Temp: 98.3 F (36.8 C) Temp Source: Oral Pulse Rate: (!) 59 Resp: 17 BP: (!) 161/70 mmHg Patient Position (if appropriate): Sitting Oxygen Therapy SpO2: 94 % O2 Device: Not Delivered Pain: Pain Assessment Pain Assessment: No/denies pain Pain Score: 0-No pain NMR: Neuromuscular Facilitation: Upper Extremity;Right;Forced use;Activity to increase motor control;Activity to increase grading;Activity to increase sustained activation;Activity to increase lateral weight shifting;Activity to increase anterior-posterior weight shifting. - Pt performed the following in standing without UE support: RUE reaching outside BOS (across midline and laterally) for horseshoes then hung horseshoes on shortened basketball  goal rim for increased stability with functional weight shifting, increased visual attention to R visual field, and to promote functional use of RUE. Pt required close supervision to min A to recover from single LOB to R side and verbal cueing for to visually attend to target when reaching to R side.  - Transitioned to alternating bilat reciprocal step taps (on 4" step) for increased lateral weight shifting, R hip/knee flexion to increase RLE clearance during gait. Initially pt required R HHA and needed to look at R foot but eventually able to perform with forward gaze and no UE support with supervision and 2 minor R toe catches during final 20 reps.  - While semi reclined on mat table, performed RLE PNF D1 flexion/extension x15 reps rhythmic initiation, x10 reps resisted with tactile cueing at R ankle dorsiflexors to increase initiation/selective control of R ankle dorsiflexion with concurrent R knee extension.   See FIM for current functional status  Therapy/Group: Individual Therapy  Hobble, Malva Cogan 02/09/2014, 5:17 PM

## 2014-02-09 NOTE — Progress Notes (Signed)
Occupational Therapy Session Note  Patient Details  Name: Amanda Cochran MRN: [id number] Date of Birth: 11-04-1928  Today's Date: 02/09/2014 OT Individual Time: 7353-2992 OT Individual Time Calculation (min): 60 min    Short Term Goals: Week 1:  OT Short Term Goal 1 (Week 1): STG = LTGs due to ELOS  Skilled Therapeutic Interventions/Progress Updates:    Engaged in ADL retraining with focus on functional mobility, transfers, and functional use of dominant RUE.  Pt ambulated to toilet with min/steady assist then progressed to shower.  Bathing completed at sit > stand level in shower with supervision.  Educated on use of tub transfer bench at home with bathing to increase safety with transfer and bathing as well as energy conservation.  Pt again with no clothes, however donned underwear and socks and shoes with only setup assist.  Pt required increased time to brush hair with Rt hand and attempt to put hair in ponytail requiring  assistance.  Pt with decreased endurance this session requiring increased rest breaks and reporting fatigue with attempts to use RUE.  Ambulated to ADL apt and performed tub/shower transfer with tub transfer bench with min assist.  Pt left in recliner in room awaiting SLP.  Therapy Documentation Precautions:  Precautions Precautions: Fall Precaution Comments: Mild R inattention Restrictions Weight Bearing Restrictions: No Pain: Pain Assessment Pain Assessment: No/denies pain Pain Score: 0-No pain  See FIM for current functional status  Therapy/Group: Individual Therapy  Simonne Come 02/09/2014, 9:33 AM

## 2014-02-10 ENCOUNTER — Inpatient Hospital Stay (HOSPITAL_COMMUNITY): Payer: Medicare Other | Admitting: Occupational Therapy

## 2014-02-10 ENCOUNTER — Inpatient Hospital Stay (HOSPITAL_COMMUNITY): Payer: Medicare Other | Admitting: Physical Therapy

## 2014-02-10 ENCOUNTER — Inpatient Hospital Stay (HOSPITAL_COMMUNITY): Payer: Medicare Other | Admitting: Speech Pathology

## 2014-02-10 DIAGNOSIS — I6340 Cerebral infarction due to embolism of unspecified cerebral artery: Secondary | ICD-10-CM

## 2014-02-10 MED ORDER — AMLODIPINE BESYLATE 2.5 MG PO TABS
2.5000 mg | ORAL_TABLET | Freq: Every day | ORAL | Status: DC
  Administered 2014-02-10 – 2014-02-11 (×2): 2.5 mg via ORAL
  Filled 2014-02-10 (×3): qty 1

## 2014-02-10 NOTE — Progress Notes (Addendum)
Occupational Therapy Session Note  Patient Details  Name: Amanda Cochran MRN: [id number] Date of Birth: [birth year]  Today's Date: 02/10/2014 OT Individual Time: 2536-6440 OT Individual Time Calculation (min): 49 min    Short Term Goals: Week 1:  OT Short Term Goal 1 (Week 1): STG = LTGs due to ELOS  Skilled Therapeutic Interventions/Progress Updates:    Engaged in ADL retraining with focus on functional mobility, transfers, and functional use of dominant RUE during self-care tasks.  Pt reports pain in knee, improving from AM session but not rated.  Ambulated to toilet with close supervision this session, completing toileting and doffing underwear from toilet.  Bathing completed at sit > stand level in room shower with education on safety and energy conservation with bathing at seated level.  Noted pt with decreased functional grasp with difficulty squeezing soap from liquid soap bottle, requiring cue to attempt again with increased success.  Pt's friend brought clothes last night.  Educated on fastening bra in front and rotating around trunk as she was unable to fasten bra behind back due to decreased strength and coordination in Rt hand.  Completed UB and LB dressing with setup assist only.  Therapy Documentation Precautions:  Precautions Precautions: Fall Precaution Comments: Mild R inattention Restrictions Weight Bearing Restrictions: No General:   Vital Signs: Therapy Vitals BP: (!) 136/59 mmHg Patient Position (if appropriate): Sitting Pain: Pain Assessment Pain Assessment: No/denies pain Pain Score: 10-Worst pain ever Pain Location: Knee Pain Orientation: Right  See FIM for current functional status  Therapy/Group: Individual Therapy  Simonne Come 02/10/2014, 11:55 AM

## 2014-02-10 NOTE — Progress Notes (Signed)
80 y.o. right handed female with history of hypertension and previous strokes with little residual weakness maintained on aspirin 325 mg daily and severe glaucoma with limited vision. Lives alone and uses a cane. He presented 02/03/2014 with slurred speech and right facial droop. MRI showed acute left basal ganglia infarct as well as moderate chronic small vessel ischemic disease. MRA with no evidence of major intracranial occlusion or stenosis. Patient did not receive TPA. Echocardiogram with ejection fraction of 40% without embolism. Carotid Doppler no ICA stenosis. Neurology services consulted presently on aspirin 81 mg daily as well as Aggrenox. Dysphagia 3 nectar thick liquid diet  Subjective/Complaints: States that she did not sleep well last night. She states she was over thinking. She noted yesterday during therapy that her right knee tended to "give way".  Objective: Vital Signs: Blood pressure 175/35, pulse 69, temperature 97.5 F (36.4 C), temperature source Oral, resp. rate 16, height 5\' 6"  (1.676 m), weight 154 lb 1.6 oz (69.9 kg), SpO2 93 %. No results found.   Elderly female in no acute distress. HEENT exam: Atraumatic, normocephalic Neck supple without lymphadenopathy or thyromegaly. Chest clear to auscultation Cardiac exam regular rate Abdominal exam active bowel sounds, soft. Extremities without edema  Assessment/Plan: 1. Functional deficits secondary to Left basal ganglia infarct   Medical Problem List and Plan: 1. Functional deficits secondary to left basal ganglia infarct secondary to small vessel disease 2.  DVT Prophylaxis/Anticoagulation: Subcutaneous heparin. Monitor platelet counts and any signs of bleeding 3. Pain Management: patient denies pain  4. Dysphagia. Dysphagia 3 and liquids. Monitor for any signs of aspiration. Follow-up speech therapy 5. Neuropsych: This patient is capable of making decisions on his own behalf. 6. Skin/Wound Care: Routine skin  checks 7. Fluids/Electrolytes/Nutrition: Strict I and O's. Follow-up chemistries 8. Hypertension. Lotensin 40 mg daily. Monitor with increased mobility  blood pressure ranged from 161-176/70-73  Will add low-dose amlodipine  9. Mood/depression. Lexapro 10 mg daily. Provide emotional support 10. Glaucoma. Continue eyedrops as advised 11. Hypothyroidism. Synthroid 12. Hyperlipidemia. Zocor  LOS (Days) 3 A FACE TO FACE EVALUATION WAS PERFORMED  , HENRY 02/10/2014, 9:00 AM

## 2014-02-10 NOTE — Progress Notes (Signed)
Physical Therapy Session Note  Patient Details  Name: Amanda Cochran MRN: [id number] Date of Birth: 16-Sep-1928  Today's Date: 02/10/2014 PT Individual Time: 0805-0900 1300-1405 PT Individual Time Calculation (min): 55 min , 45  Short Term Goals: Week 1:  PT Short Term Goal 1 (Week 1): STG's = LTG's secondary to anticipated LOS. Skilled Therapeutic Interventions/Progress Updates:    Pt demonstrates difficulty since last PT session due to R knee pain that suddenly comes and goes, causing buckling. Pt with most relief in prone lying, consistent with possible lumbar derangement. Pt also with some relief from manual work directed at knee and core neuromuscular re-ed. Pt frustrated with this impairment, but still able to progress throughout session.  Therapy Documentation Precautions:  Precautions Precautions: Fall Precaution Comments: Mild R inattention Restrictions Weight Bearing Restrictions: No Vital Signs: Therapy Vitals Temp: 97.5 F (36.4 C) Temp Source: Oral Pulse Rate: 69 Resp: 16 BP: (!) 175/35 mmHg Oxygen Therapy SpO2: 93 % O2 Device: Not Delivered Pain: Pain Assessment Pain Score: 10-Worst pain ever Pain Location: Knee Pain Orientation: Right Mobility:  Pt min A for transfers with cues for technique Locomotion : Ambulation Ambulation/Gait Assistance: 4: Min assist with cues for gait velcoity and diaphragmatic ventilation  Other Treatments:  Tx1: Pt educated on rehab plan, pain management, and safety in mobility. Pt performs static and dynamic sitting balance activities with dual motor tasks including reaching, grasping, LE manipulation, and weight shifting within functional tasks. Prone lying x6' performed. Facilitation of diaphragm and lateral chest breathing performed followed by resisted R lateral chest breathing and active practice. Diaphragm breathing then performed in standing with cues for alignment.  Tx2:  Hamstring, quadriceps, IT band, gastroc release  followed by patellar mobs. Seated knee flexion, hip abd/add AROM, LAQs, hip ER AROM, hip IR AROM, anterior weight shifts, heel raises, anterior weight shifts in standing, ankle pumps 2x10 with cues for ventilation. Pt educated on rehab plan, safety in mobility, and progressing activities.  See FIM for current functional status  Therapy/Group: Individual Therapy  Monia Pouch 02/10/2014, 8:54 AM

## 2014-02-10 NOTE — Progress Notes (Signed)
Speech Language Pathology Daily Session Note  Patient Details  Name: Amanda Cochran MRN: [id number] Date of Birth: Jun 26, 1928  Today's Date: 02/10/2014 SLP Individual Time: 6295-2841 SLP Individual Time Calculation (min): 45 min  Short Term Goals: Week 1: SLP Short Term Goal 1 (Week 1): Pt will tolerate presentations of her currently prescribed diet with no overt s/s of aspiration with Mod I cues for use of swallowing precautiosn  SLP Short Term Goal 2 (Week 1): Pt will tolerate trials of regular water following oral care per the water protocol with no overt s/s of aspiration with supervision cues.  SLP Short Term Goal 3 (Week 1): Pt will return demonstration of how to thicken liquids with Mod I  SLP Short Term Goal 4 (Week 1): Pt will improve recall of daily information via use of external aids over 80% of observable opportunities with Mod I  SLP Short Term Goal 5 (Week 1): Pt will improve speech intelligibility at the conversational level to >80% via compensatory strategies with Mod I.    Skilled Therapeutic Interventions: Skilled treatment session focused on continued diagnostic treatment of patient's cognitive function. SLP facilitated session by administering the MoCA-Blind. Patient scored a 15/22 with a score of 18 and above considered normal.  Patient demonstrated deficits in the areas of delayed recall, verbal fluency and problem solving. Patient demonstrated appropriate emergent awareness of cognitive deficits and became labile but was quickly redirected.  SLP also facilitated session by providing extra time and Min A visual and question cues with functional problem solving with basic money management task. Patient left in wheelchair with quick release belt in place and all needs within reach.  Continue with current plan of care.    FIM:  Comprehension Comprehension Mode: Auditory Comprehension: 6-Follows complex conversation/direction: With extra time/assistive  device Expression Expression Mode: Verbal Expression: 6-Expresses complex ideas: With extra time/assistive device Social Interaction Social Interaction: 6-Interacts appropriately with others with medication or extra time (anti-anxiety, antidepressant). Problem Solving Problem Solving: 3-Solves basic 50 - 74% of the time/requires cueing 25 - 49% of the time Memory Memory: 3-Recognizes or recalls 50 - 74% of the time/requires cueing 25 - 49% of the time FIM - Eating Eating Activity: 5: Supervision/cues  Pain Pain Assessment Pain Assessment: No/denies pain  Therapy/Group: Individual Therapy  ,  02/10/2014, 12:55 PM

## 2014-02-11 ENCOUNTER — Ambulatory Visit (HOSPITAL_COMMUNITY): Payer: Medicare Other | Admitting: *Deleted

## 2014-02-11 ENCOUNTER — Inpatient Hospital Stay (HOSPITAL_COMMUNITY): Payer: Medicare Other | Admitting: Physical Therapy

## 2014-02-11 MED ORDER — AMLODIPINE BESYLATE 5 MG PO TABS
5.0000 mg | ORAL_TABLET | Freq: Every day | ORAL | Status: DC
  Administered 2014-02-12: 5 mg via ORAL
  Filled 2014-02-11 (×3): qty 1

## 2014-02-11 MED ORDER — ATORVASTATIN CALCIUM 20 MG PO TABS
20.0000 mg | ORAL_TABLET | Freq: Every day | ORAL | Status: DC
  Administered 2014-02-11 – 2014-02-18 (×8): 20 mg via ORAL
  Filled 2014-02-11 (×9): qty 1

## 2014-02-11 NOTE — Progress Notes (Signed)
80 y.o. right handed female with history of hypertension and previous strokes with little residual weakness maintained on aspirin 325 mg daily and severe glaucoma with limited vision. Lives alone and uses a cane. He presented 02/03/2014 with slurred speech and right facial droop. MRI showed acute left basal ganglia infarct as well as moderate chronic small vessel ischemic disease. MRA with no evidence of major intracranial occlusion or stenosis. Patient did not receive TPA. Echocardiogram with ejection fraction of 40% without embolism. Carotid Doppler no ICA stenosis. Neurology services consulted presently on aspirin 81 mg daily as well as Aggrenox. Dysphagia 3 nectar thick liquid diet (As documented in history and physical) Subjective/Complaints: She slept well last night. Has no significant complaints. She still notes that her right knee feels weak during therapy.  Objective: Vital Signs: Blood pressure 174/76, pulse 78, temperature 98.6 F (37 C), temperature source Oral, resp. rate 18, height 5\' 6"  (1.676 m), weight 154 lb 1.6 oz (69.9 kg), SpO2 95 %. No results found.   Elderly female who is alert and oriented and can carry on a normal conversation. Neck is supple. Chest clear to auscultation. Cardiac exam S1 and S2 are regular. Abdominal exam active bowel sounds, soft. Extremities no edema.  Assessment/Plan: 1. Functional deficits secondary to Left basal ganglia infarct   Medical Problem List and Plan: 1. Functional deficits secondary to left basal ganglia infarct secondary to small vessel disease 2.  DVT Prophylaxis/Anticoagulation: Subcutaneous heparin.  3. Pain Management: patient denies pain  4. Dysphagia. Dysphagia 3 and liquids. Monitor for any signs of aspiration. Follow-up speech therapy 5. Neuropsych: This patient is capable of making decisions on his own behalf. 6. Skin/Wound Care: Routine skin checks 7. Fluids/Electrolytes/Nutrition: Strict I and O's. Follow-up chemistries 8.  Hypertension. Lotensin 40 mg daily. Monitor with increased mobility  blood pressure ranged from 158-176/70-84 Will increase dose of amlodipine 9. Mood/depression. Lexapro 10 mg daily. Provide emotional support 10. Glaucoma. Continue eyedrops as advised 11. Hypothyroidism. Synthroid 12. Hyperlipidemia. Zocor 13. She is currently on Lexapro. She tells me that she was prescribed this as an outpatient. She took one dose and has not taken since then. She prefers not to take Lexapro and I'll discontinue that.  LOS (Days) 4 A FACE TO FACE EVALUATION WAS PERFORMED  SWORDS,BRUCE HENRY 02/11/2014, 8:31 AM

## 2014-02-11 NOTE — Progress Notes (Signed)
Physical Therapy Session Note  Patient Details  Name: MEILY GLOWACKI MRN: [id number] Date of Birth: 06-Aug-1928  Today's Date: 02/11/2014 PT Individual Time: 8032-1224 PT Individual Time Calculation (min): 45 min   Short Term Goals: Week 1:  PT Short Term Goal 1 (Week 1): STG's = LTG's secondary to anticipated LOS.  Skilled Therapeutic Interventions/Progress Updates:    Pt continues to be limited by RLE pain that is worst with turning and pivoting. Pt doing better in session until last ambulation trial with increased pain. Pt with upper chest breathing and increased respiratory rate when pain occurs, further exacerbating problems. Pt would continue to benefit from gradual increase in activity levels.  Therapy Documentation Precautions:  Precautions Precautions: Fall Precaution Comments: Mild R inattention Restrictions Weight Bearing Restrictions: No Vital Signs: Therapy Vitals Temp: 98.6 F (37 C) Temp Source: Oral Pulse Rate: 78 Resp: 18 BP: (!) 174/76 mmHg Patient Position (if appropriate): Lying Oxygen Therapy SpO2: 95 % O2 Device: Not Delivered Pain: Pain Assessment Pain Assessment: No/denies pain Mobility:  Pt performs transfers with Min A with cues for sequencing and safety Locomotion :   Pt ambulates 125' Min A with cues for sequencing and ventilation Other Treatments:  Pt educated on rehab plan and progressing mobility. Pt performs standing hip ER 2x10. Piriformis releases performed. Hip IR AAROM, heel raises, AROM, LAQs, and anterior weight shifts, sit stands, and hip ER in standing 2x10. Static standing performed 2'x2.   See FIM for current functional status  Therapy/Group: Individual Therapy  Monia Pouch 02/11/2014, 9:00 AM

## 2014-02-12 ENCOUNTER — Inpatient Hospital Stay (HOSPITAL_COMMUNITY): Payer: Medicare Other | Admitting: Speech Pathology

## 2014-02-12 ENCOUNTER — Inpatient Hospital Stay (HOSPITAL_COMMUNITY): Payer: Medicare Other | Admitting: Occupational Therapy

## 2014-02-12 ENCOUNTER — Inpatient Hospital Stay (HOSPITAL_COMMUNITY): Payer: Medicare Other | Admitting: Physical Therapy

## 2014-02-12 DIAGNOSIS — I10 Essential (primary) hypertension: Secondary | ICD-10-CM

## 2014-02-12 NOTE — Progress Notes (Signed)
80 y.o. right handed female with history of hypertension and previous strokes with little residual weakness maintained on aspirin 325 mg daily and severe glaucoma with limited vision. Lives alone and uses a cane. He presented 02/03/2014 with slurred speech and right facial droop. MRI showed acute left basal ganglia infarct as well as moderate chronic small vessel ischemic disease. MRA with no evidence of major intracranial occlusion or stenosis. Patient did not receive TPA. Echocardiogram with ejection fraction of 40% without embolism. Carotid Doppler no ICA stenosis. Neurology services consulted presently on aspirin 81 mg daily as well as Aggrenox. Dysphagia 3 nectar thick liquid diet  Subjective/Complaints: Right felt weak in therapy yesterday Review of Systems - Negative except as above  Objective: Vital Signs: Blood pressure 158/56, pulse 66, temperature 98.6 F (37 C), temperature source Oral, resp. rate 17, height 5\' 6"  (1.676 m), weight 69.9 kg (154 lb 1.6 oz), SpO2 92 %. No results found. No results found for this or any previous visit (from the past 72 hour(s)).   HEENT: normal Cardio: irregular and no murmur or gallop Resp: CTA B/L and unlabored GI: BS positive and NT, ND Extremity:  Pulses positive and No Edema Skin:   Intact Neuro: Alert/Oriented, anxious Cranial Nerve II-XII normal, Normal Sensory, Abnormal Motor 5-/5 in R delt, bi, tri, grip, HF, KE, ADF, Abnormal FMC Ataxic/ dec FMC and Other cerebellar FNF intact.  Visual fields intact , EOMI, neg nystagmus Musc/Skel:  Normal, FROM right hip and knee Gen NAD   Assessment/Plan: 1. Functional deficits secondary to Left basal ganglia infarct which require 3+ hours per day of interdisciplinary therapy in a comprehensive inpatient rehab setting. Physiatrist is providing close team supervision and 24 hour management of active medical problems listed below. Physiatrist and rehab team continue to assess barriers to  discharge/monitor patient progress toward functional and medical goals. FIM: FIM - Bathing Bathing Steps Patient Completed: Chest, Right Arm, Left Arm, Abdomen, Front perineal area, Buttocks, Right upper leg, Left upper leg, Right lower leg (including foot), Left lower leg (including foot) Bathing: 5: Supervision: Safety issues/verbal cues  FIM - Upper Body Dressing/Undressing Upper body dressing/undressing steps patient completed: Thread/unthread right bra strap, Thread/unthread left bra strap, Hook/unhook bra, Thread/unthread right sleeve of pullover shirt/dresss, Thread/unthread left sleeve of pullover shirt/dress, Put head through opening of pull over shirt/dress, Pull shirt over trunk Upper body dressing/undressing: 5: Set-up assist to: Obtain clothing/put away FIM - Lower Body Dressing/Undressing Lower body dressing/undressing steps patient completed: Thread/unthread right underwear leg, Thread/unthread left underwear leg, Pull underwear up/down, Thread/unthread right pants leg, Thread/unthread left pants leg, Pull pants up/down, Don/Doff right sock, Don/Doff left sock, Don/Doff right shoe, Don/Doff left shoe, Fasten/unfasten right shoe, Fasten/unfasten left shoe Lower body dressing/undressing: 5: Set-up assist to: Don/Doff TED stocking  FIM - Toileting Toileting steps completed by patient: Performs perineal hygiene, Adjust clothing after toileting, Adjust clothing prior to toileting Toileting Assistive Devices: Grab bar or rail for support Toileting: 5: Supervision: Safety issues/verbal cues  FIM - Radio producer Devices: Elevated toilet seat, Grab bars Toilet Transfers: 5-To toilet/BSC: Supervision (verbal cues/safety issues), 5-From toilet/BSC: Supervision (verbal cues/safety issues)  FIM - Control and instrumentation engineer Devices: HOB elevated Bed/Chair Transfer: 4: Bed > Chair or W/C: Min A (steadying Pt. > 75%), 4: Chair or W/C > Bed:  Min A (steadying Pt. > 75%), 5: Supine > Sit: Supervision (verbal cues/safety issues)  FIM - Locomotion: Wheelchair Locomotion: Wheelchair: 0: Activity did not occur FIM - Locomotion:  Ambulation Locomotion: Ambulation Assistive Devices: Other (comment) Ambulation/Gait Assistance: 4: Min assist Locomotion: Ambulation: 4: Travels 150 ft or more with minimal assistance (Pt.>75%)  Comprehension Comprehension Mode: Auditory Comprehension: 6-Follows complex conversation/direction: With extra time/assistive device  Expression Expression Mode: Verbal Expression: 6-Expresses complex ideas: With extra time/assistive device  Social Interaction Social Interaction: 6-Interacts appropriately with others with medication or extra time (anti-anxiety, antidepressant).  Problem Solving Problem Solving: 5-Solves basic problems: With no assist  Memory Memory: 5-Recognizes or recalls 90% of the time/requires cueing < 10% of the time   Medical Problem List and Plan: 1. Functional deficits secondary to left basal ganglia infarct secondary to small vessel disease 2.  DVT Prophylaxis/Anticoagulation: Subcutaneous heparin. Monitor platelet counts and any signs of bleeding 3. Pain Management: Tylenol as needed 4. Dysphagia. Dysphagia 3 and liquids. Monitor for any signs of aspiration. Follow-up speech therapy 5. Neuropsych: This patient is capable of making decisions on his own behalf. 6. Skin/Wound Care: Routine skin checks 7. Fluids/Electrolytes/Nutrition: Strict I and O's. Follow-up chemistries 8. Hypertension. Lotensin 40 mg daily. Monitor with increased mobility 9. Mood/depression. Lexapro 10 mg daily. Provide emotional support 10. Glaucoma. Continue eyedrops as advised 11. Hypothyroidism. Synthroid 12. Hyperlipidemia. Zocor 13.  Arrythmia last 2 EKG show freq PVC, no rx, no Afib LOS (Days) 5 A FACE TO FACE EVALUATION WAS PERFORMED  , E 02/12/2014, 6:46 AM

## 2014-02-12 NOTE — Progress Notes (Signed)
Performed water protocol with patient.  Patient brushed teeth and washed mouth out with mouthwash per instructions.  Patient drank approximately 8 oz of water per tsp.  Patient got choked and coughed at one time, but was able to finish drinking without incident using compensatory strategies.  Will continue to monitor.  [name], RN

## 2014-02-12 NOTE — Progress Notes (Signed)
Speech Language Pathology Daily Session Note  Patient Details  Name: Amanda Cochran MRN: [id number] Date of Birth: 31-Dec-1928  Today's Date: 02/12/2014 SLP Individual Time: 0900-0930; 1030-1100 SLP Individual Time Calculation (min): 30 min; 30 min    Short Term Goals: Week 1: SLP Short Term Goal 1 (Week 1): Pt will tolerate presentations of her currently prescribed diet with no overt s/s of aspiration with Mod I cues for use of swallowing precautiosn  SLP Short Term Goal 2 (Week 1): Pt will tolerate trials of regular water following oral care per the water protocol with no overt s/s of aspiration with supervision cues.  SLP Short Term Goal 3 (Week 1): Pt will return demonstration of how to thicken liquids with Mod I  SLP Short Term Goal 4 (Week 1): Pt will improve recall of daily information via use of external aids over 80% of observable opportunities with Mod I  SLP Short Term Goal 5 (Week 1): Pt will improve speech intelligibility at the conversational level to >80% via compensatory strategies with Mod I.    Skilled Therapeutic Interventions:  Session 1: Pt was seen for skilled ST targeting swallowing goals and ongoing education.  Upon arrival, pt was asleep in bed but was easily awakened to voice and light touch.  SLP provided skilled education for how to thicken liquids, with pt able to return demonstration with min-mod assist verbal cues.  Handout was provided to facilitate carryover at home and in between therapy sessions.  Will address in later therapy session to target delayed recall of new information.      Session 2: Pt was seen for skilled ST targeting swallowing and cognitive goals.  Pt was received from OT session while standing at sink completing oral care.  SLP facilitated the session with supervision cues for recall of how to thicken liquids from previous AM therapy session.  Additionally, SLP completed skilled observations with presentations of regular water via teaspoon per the  water protocol.  No overt s/s of aspiration were noted with the abovementioned trials or with trials of regular water via cup sips.  Pt also reports improved facial sensation.  Continue per current plan of care.    FIM:  Comprehension Comprehension Mode: Auditory Comprehension: 6-Follows complex conversation/direction: With extra time/assistive device Expression Expression Mode: Verbal Expression: 6-Expresses complex ideas: With extra time/assistive device Social Interaction Social Interaction: 6-Interacts appropriately with others with medication or extra time (anti-anxiety, antidepressant). Problem Solving Problem Solving: 5-Solves basic problems: With no assist Memory Memory: 5-Recognizes or recalls 90% of the time/requires cueing < 10% of the time FIM - Eating Eating Activity: 5: Supervision/cues  Pain Pain Assessment (Session 1) Pain Assessment: No/denies pain Pain Assessment (Session 2) Pain Assessment: No/denies pain  Therapy/Group: Individual Therapy  , Selinda Orion 02/12/2014, 2:09 PM

## 2014-02-12 NOTE — Progress Notes (Signed)
Occupational Therapy Session Note  Patient Details  Name: Amanda Cochran MRN: [id number] Date of Birth: Apr 20, 1928  Today's Date: 02/12/2014 OT Individual Time: 0930-1030 OT Individual Time Calculation (min): 60 min    Short Term Goals: Week 1:  OT Short Term Goal 1 (Week 1): STG = LTGs due to ELOS  Skilled Therapeutic Interventions/Progress Updates:    Engaged in ADL retraining with focus on functional mobility functional use of dominant RUE, and increased independence with self-care tasks.  Pt received in bed reporting knee buckling over weekend during PT sessions resulting in knee pain, but no pain at this time.  Pt ambulated with close supervision in room without AD to retrieve items in preparation for bathing and dressing.  During ambulation to shower pt's Rt knee buckled requiring mod assist to regain balance.  Bathing and dressing completed at sit > stand level with distant supervision with no further knee buckling but close supervision to occasional min assist during mobility.  Discussed d/c plan with pt reporting really wanting to return home.  Completed handwriting activity with Rt hand with pt reporting handwriting is "85% better".    Therapy Documentation Precautions:  Precautions Precautions: Fall Precaution Comments: Mild R inattention Restrictions Weight Bearing Restrictions: No General:   Vital Signs: Therapy Vitals Temp: 97.9 F (36.6 C) Temp Source: Oral Pulse Rate: 75 Resp: 17 BP: (!) 129/48 mmHg Patient Position (if appropriate): Lying Oxygen Therapy SpO2: 98 % O2 Device: Not Delivered Pain: Pain Assessment Pain Assessment: No/denies pain  See FIM for current functional status  Therapy/Group: Individual Therapy  Simonne Come 02/12/2014, 3:19 PM

## 2014-02-12 NOTE — Progress Notes (Addendum)
Physical Therapy Session Note  Patient Details  Name: Amanda Cochran MRN: [id number] Date of Birth: January 15, 1929  Today's Date: 02/12/2014 PT Individual Time: 1300-1400 PT Individual Time Calculation (min): 60 min   Short Term Goals: Week 1:  PT Short Term Goal 1 (Week 1): STG's = LTG's secondary to anticipated LOS.  Skilled Therapeutic Interventions/Progress Updates:    Pt received seated EOB finishing lunch with RN present; agreeable to therapy. Pt reporting R knee "buckling" and pain with ambulation in therapy over weekend and this AM. Pt performed ambulation x75' in controlled environment with close supervision to min guard, min A to recover from instance of R genu recurvatum in R midstance. Upon observation of pt's R knee in seated, noted concordant pain with end range R knee extension. Added heel lift to R shoe to prevent genu recurvatum, then performed functional ambulation x200' in controlled environment without assistive device with no instances of R genu recurvatum but continuing to exhibit R genu varum. Utilized NMES on medial aspect of distal R hamstrings to increase control of R knee extension during dynamic standing, ambulation. Optimum gait pattern (active control of R genu recurvatum and genu varum during RLE midstance) achieved with parameters at 8.5 mA; 1:3 duty cycle, 2-second ramp, 50 pps, x20 minutes total. While using NMES, pt performed gait >250' in controlled and home environments with close supervision; verbal cueing provided for widened BOS, R heel strike, and bilat arm swing. Pt negotiated 6 stairs laterally with bilat UE support at R rail, step-to pattern, and supervision. Pt reported no R knee pain during/after therapy. Session ended in pt room, where pt was left supine in bed with 3 rails up, bed alarm on, and all needs within reach.  Therapy Documentation Precautions:  Precautions Precautions: Fall Precaution Comments: Mild R inattention Restrictions Weight Bearing  Restrictions: No Pain: Pain Assessment Pain Assessment: No/denies pain Locomotion : Ambulation Ambulation/Gait Assistance: 4: Min assist;5: Supervision;4: Min guard   See FIM for current functional status  Therapy/Group: Individual Therapy  , Malva Cogan 02/12/2014, 1:57 PM

## 2014-02-13 ENCOUNTER — Inpatient Hospital Stay (HOSPITAL_COMMUNITY): Payer: Medicare Other | Admitting: Physical Therapy

## 2014-02-13 ENCOUNTER — Inpatient Hospital Stay (HOSPITAL_COMMUNITY): Payer: Medicare Other | Admitting: Occupational Therapy

## 2014-02-13 ENCOUNTER — Inpatient Hospital Stay (HOSPITAL_COMMUNITY): Payer: Medicare Other | Admitting: *Deleted

## 2014-02-13 MED ORDER — AMLODIPINE BESYLATE 10 MG PO TABS
10.0000 mg | ORAL_TABLET | Freq: Every day | ORAL | Status: DC
  Administered 2014-02-13 – 2014-02-19 (×7): 10 mg via ORAL
  Filled 2014-02-13 (×8): qty 1

## 2014-02-13 MED ORDER — ALUM & MAG HYDROXIDE-SIMETH 200-200-20 MG/5ML PO SUSP
30.0000 mL | ORAL | Status: DC | PRN
  Administered 2014-02-13 – 2014-02-16 (×2): 30 mL via ORAL
  Filled 2014-02-13 (×3): qty 30

## 2014-02-13 NOTE — Progress Notes (Signed)
Physical Therapy Session Note  Patient Details  Name: Amanda Cochran MRN: [id number] Date of Birth: [birth year]  Today's Date: 02/13/2014 PT Individual Time: 0830-0930 and 1140-1210 PT Individual Time Calculation (min): 60 min and 30 min   Short Term Goals: Week 1:  PT Short Term Goal 1 (Week 1): STG's = LTG's secondary to anticipated LOS.  Skilled Therapeutic Interventions/Progress Updates:    Treatment Session 1: Pt received seated in bedside chair; agreeable to therapy. Session focused on increasing motor control in R knee, initiating fall recovery/floor transfer. Pt performed functional ambulation 150' x2 trials (rest breaks between trials, per pt request) in controlled and home environments without assistive device with supervision to min guard. During ambulation, pt demonstrated increased RLE external rotation, narrow BOS and intermittent LLE adduction (more prominent during turning). In rehab apartment, educated pt on fall recovery, situations in which to activate EMS after fall. Discussed signs and symptoms of stroke. Explained, demonstrated floor transfer (seated on couch<>long sitting on mat on floor) for fall recovery and quadruped position (for RUE/RLE weightbearing,  activation). Pt then performed floor transfer with min guard.   Utilized NMES on medial aspect of distal R hamstrings to increase control of R knee extension during dynamic standing, ambulation. Optimum gait pattern (active control of R genu recurvatum and genu varum during RLE midstance) achieved with parameters at 9.5 mA; 1:3 duty cycle, 2-second ramp, 50 pps, x10 minutes total. While using NMES, pt performed gait x200' in controlled environment without assistive device with assist/cueing as described above. After NMES, pt performd pt performed R TKE resisted by orange Tband 2 x10 reps for increased control of R knee extension. During exercise, pt's face became flushed and pt stated, "I know you're talking to me but I can't  understanding anything you're saying; I feel so strange." Repositioned pt into seated with vitals as follows: seated BP 121/43 and HR 76; standing BP 79/30 and HR 83. Notified RN. Symptoms resolved with next standing, ambulation trial. Session ended in pt room, where pt was left seated in bedside chair with all needs within reach.  Treatment Session 2: Pt received seated in bedside chair with CSW, [place], present. Pt, CSW, and this PT discussed functional impairments and discharge plan. Pt expressing significant anxiety concerning lack of family support at D/C. This PT explained that, based on gait instability, considering downgrading long term goals to supervision. CSW to follow up with pt following team conference tomorrow.  Remainder of session focused on trialing posterior leaf spring AFO ot address R genu recurvatum. Donned [place] WalkOn on RLE with R heel wedge to address genu recurvatum. Pt then performed functional ambulation x200' in controlled environment without assistive requiring close supervision to min A secondary to LOB with excessive LLE adduction during RLE stance phase, which appears to be secondary to R hip abductor weakness; will continue to assess. Doffed R AFO. Negotiated 7 stairs laterally with bilat UE support at R rail, step-to pattern, and supervision, verbal/tactile cueing for sequencing. Session ended in pt room, where pt was left seated in bedside chair with all needs within reach.  Therapy Documentation Precautions:  Precautions Precautions: Fall Precaution Comments: Mild R inattention Restrictions Weight Bearing Restrictions: No Vital Signs: Therapy Vitals Pulse Rate: 82 BP: (!) 79/30 mmHg Patient Position (if appropriate): Sitting Oxygen Therapy SpO2: 97 % O2 Device: Not Delivered Pain: Session 1: No c/o pain. Session 2: Pain Assessment Pain Assessment: 0-10 Pain Score: 3  Pain Type: Acute pain Pain Location: Knee Pain Orientation: Right;Posterior  Pain  Descriptors / Indicators: Aching Pain Onset: With Activity Pain Intervention(s): Other (Comment) (bracing) Multiple Pain Sites: No  See FIM for current functional status  Therapy/Group: Individual Therapy  , Malva Cogan 02/13/2014, 12:21 PM

## 2014-02-13 NOTE — Progress Notes (Signed)
Patient drank approx 8 oz of water following the guidelines of the water protocol.  Patient performed oral care (teeth brushed and mouthwash) with Judson Roch, OT.  No overt s/s of aspiration.  [name], RN

## 2014-02-13 NOTE — Progress Notes (Signed)
80 y.o. right handed female with history of hypertension and previous strokes with little residual weakness maintained on aspirin 325 mg daily and severe glaucoma with limited vision. Lives alone and uses a cane. He presented 02/03/2014 with slurred speech and right facial droop. MRI showed acute left basal ganglia infarct as well as moderate chronic small vessel ischemic disease. MRA with no evidence of major intracranial occlusion or stenosis. Patient did not receive TPA. Echocardiogram with ejection fraction of 40% without embolism. Carotid Doppler no ICA stenosis. Neurology services consulted presently on aspirin 81 mg daily as well as Aggrenox. Dysphagia 3 nectar thick liquid diet  Subjective/Complaints: Right knee pain, no fall or trauma, wants to go home but no assist Review of Systems - Negative except as above  Objective: Vital Signs: Blood pressure 170/55, pulse 65, temperature 98.4 F (36.9 C), temperature source Oral, resp. rate 17, height 5\' 6"  (1.676 m), weight 69.9 kg (154 lb 1.6 oz), SpO2 95 %. No results found. No results found for this or any previous visit (from the past 72 hour(s)).   HEENT: normal Cardio: irregular and no murmur or gallop Resp: CTA B/L and unlabored GI: BS positive and NT, ND Extremity:  Pulses positive and No Edema Skin:   Intact Neuro: Alert/Oriented, anxious Cranial Nerve II-XII normal, Normal Sensory, Abnormal Motor 5-/5 in R delt, bi, tri, grip, HF, KE, ADF, Abnormal FMC Ataxic/ dec FMC and Other cerebellar FNF intact.  Visual fields intact , EOMI, neg nystagmus Musc/Skel:  Normal, FROM right hip and knee Gen NAD   Assessment/Plan: 1. Functional deficits secondary to Left basal ganglia infarct which require 3+ hours per day of interdisciplinary therapy in a comprehensive inpatient rehab setting. Physiatrist is providing close team supervision and 24 hour management of active medical problems listed below. Physiatrist and rehab team continue to  assess barriers to discharge/monitor patient progress toward functional and medical goals. Plan team conf in am FIM: FIM - Bathing Bathing Steps Patient Completed: Chest, Right Arm, Left Arm, Abdomen, Front perineal area, Buttocks, Right upper leg, Left upper leg, Right lower leg (including foot), Left lower leg (including foot) Bathing: 5: Supervision: Safety issues/verbal cues  FIM - Upper Body Dressing/Undressing Upper body dressing/undressing steps patient completed: Thread/unthread right bra strap, Thread/unthread left bra strap, Hook/unhook bra, Thread/unthread right sleeve of pullover shirt/dresss, Thread/unthread left sleeve of pullover shirt/dress, Put head through opening of pull over shirt/dress, Pull shirt over trunk Upper body dressing/undressing: 6: More than reasonable amount of time FIM - Lower Body Dressing/Undressing Lower body dressing/undressing steps patient completed: Thread/unthread right underwear leg, Thread/unthread left underwear leg, Pull underwear up/down, Thread/unthread right pants leg, Thread/unthread left pants leg, Pull pants up/down, Don/Doff right sock, Don/Doff left sock, Don/Doff right shoe, Don/Doff left shoe, Fasten/unfasten right shoe, Fasten/unfasten left shoe Lower body dressing/undressing: 5: Set-up assist to: Don/Doff TED stocking  FIM - Toileting Toileting steps completed by patient: Performs perineal hygiene, Adjust clothing after toileting, Adjust clothing prior to toileting Toileting Assistive Devices: Grab bar or rail for support Toileting: 5: Supervision: Safety issues/verbal cues  FIM - Radio producer Devices: Elevated toilet seat, Grab bars Toilet Transfers: 5-To toilet/BSC: Supervision (verbal cues/safety issues), 5-From toilet/BSC: Supervision (verbal cues/safety issues)  FIM - Control and instrumentation engineer Devices: Arm rests Bed/Chair Transfer: 5: Bed > Chair or W/C: Supervision (verbal  cues/safety issues), 5: Chair or W/C > Bed: Supervision (verbal cues/safety issues)  FIM - Locomotion: Wheelchair Locomotion: Wheelchair: 0: Activity did not occur (Pt ambulatory  on unit) FIM - Locomotion: Ambulation Locomotion: Ambulation Assistive Devices: Other (comment) Ambulation/Gait Assistance: 4: Min assist, 5: Supervision, 4: Min guard Locomotion: Ambulation: 4: Travels 150 ft or more with minimal assistance (Pt.>75%)  Comprehension Comprehension Mode: Auditory Comprehension: 6-Follows complex conversation/direction: With extra time/assistive device  Expression Expression Mode: Verbal Expression: 6-Expresses complex ideas: With extra time/assistive device  Social Interaction Social Interaction: 6-Interacts appropriately with others with medication or extra time (anti-anxiety, antidepressant).  Problem Solving Problem Solving: 5-Solves basic problems: With no assist  Memory Memory: 5-Recognizes or recalls 90% of the time/requires cueing < 10% of the time   Medical Problem List and Plan: 1. Functional deficits secondary to left basal ganglia infarct secondary to small vessel disease 2.  DVT Prophylaxis/Anticoagulation: Subcutaneous heparin. Monitor platelet counts and any signs of bleeding 3. Pain Management: Tylenol as needed, add voltaren gel R knee 4. Dysphagia. Dysphagia 3 and liquids. Monitor for any signs of aspiration. Follow-up speech therapy 5. Neuropsych: This patient is capable of making decisions on his own behalf. 6. Skin/Wound Care: Routine skin checks 7. Fluids/Electrolytes/Nutrition: Strict I and O's. Follow-up chemistries, 8. Hypertension. Lotensin 40 mg daily. Monitor with increased mobility, will adjust meds 9. Mood/depression. Lexapro 10 mg daily. Provide emotional support 10. Glaucoma. Continue eyedrops as advised 11. Hypothyroidism. Synthroid 12. Hyperlipidemia. Zocor 13.  Arrythmia last 2 EKG show freq PVC, no rx, no Afib LOS (Days) 6 A FACE TO  FACE EVALUATION WAS PERFORMED  , E 02/13/2014, 6:44 AM

## 2014-02-13 NOTE — Progress Notes (Signed)
Speech Language Pathology Daily Session Note  Patient Details  Name: Amanda Cochran MRN: [id number] Date of Birth: 04-Dec-1928  Today's Date: 02/13/2014 SLP Individual Time: 2878-6767 SLP Individual Time Calculation (min): 30 min  Short Term Goals: Week 1: SLP Short Term Goal 1 (Week 1): Pt will tolerate presentations of her currently prescribed diet with no overt s/s of aspiration with Mod I cues for use of swallowing precautiosn  SLP Short Term Goal 2 (Week 1): Pt will tolerate trials of regular water following oral care per the water protocol with no overt s/s of aspiration with supervision cues.  SLP Short Term Goal 3 (Week 1): Pt will return demonstration of how to thicken liquids with Mod I  SLP Short Term Goal 4 (Week 1): Pt will improve recall of daily information via use of external aids over 80% of observable opportunities with Mod I  SLP Short Term Goal 5 (Week 1): Pt will improve speech intelligibility at the conversational level to >80% via compensatory strategies with Mod I.    Skilled Therapeutic Interventions:   Pt was seen for skilled ST targeting dysphagia goals.  Upon arrival, pt was seated upright in recliner, awake, alert, and agreeable to participate in [place].  SLP completed skilled observations with presentations of regular water via teaspoons and cup sips to continue working towards liquids advancement.  Pt exhibited x1 immediate cough with teaspoons of thin liquids and x1 immediate cough with cup sips of thin liquids, both of which were likely related to suspected delayed swallow initiation resulting in decreased airway protection.  Continue per current plan of care.    FIM:  Comprehension Comprehension Mode: Auditory Comprehension: 6-Follows complex conversation/direction: With extra time/assistive device Expression Expression Mode: Verbal Expression: 6-Expresses complex ideas: With extra time/assistive device Social Interaction Social Interaction: 6-Interacts  appropriately with others with medication or extra time (anti-anxiety, antidepressant). Problem Solving Problem Solving: 5-Solves basic problems: With no assist Memory Memory: 6-More than reasonable amt of time FIM - Eating Eating Activity: 5: Supervision/cues  Pain Pain Assessment Pain Assessment: No/denies pain  Therapy/Group: Individual Therapy  , Selinda Orion 02/13/2014, 4:47 PM

## 2014-02-13 NOTE — Progress Notes (Signed)
Occupational Therapy Session Note  Patient Details  Name: Amanda Cochran MRN: [id number] Date of Birth: [birth year]  Today's Date: 02/13/2014 OT Individual Time: 1030-1100 and 1330-1400 OT Individual Time Calculation (min): 30 min and 30 min   Short Term Goals: Week 1:  OT Short Term Goal 1 (Week 1): STG = LTGs due to ELOS  Skilled Therapeutic Interventions/Progress Updates:    1) Engaged in self-care retraining with focus on functional mobility and increased independence with self-care tasks.  Pt ambulated to dresser to obtain clothing prior to bathing task in room shower. Pt with 1 episode of knee buckling (while wearing shoes with heel wedge in Rt shoe) but able to correct with supervision and no LOB.  Bathing completed at sit > stand level with distant supervision and mod I dressing, except setup to apply TED hose.  Oral care completed in standing at sink and pt returned to sitting to brush hair.  Setup with items to complete water protocol and passed off to RN to finish drinking water.  2) Engaged in therapeutic activity with focus on functional mobility and discussion of safety with meal prep tasks.  Pt ambulated to ADL kitchen with close supervision with 1 instance of Rt knee buckling but able to catch self without assist and no LOB.  In kitchen discussed breakfast routine, recommending pt not cook anything on the stove top at this time secondary to vision changes.  Completed 9 hole peg test with Rt: 43 seconds and Lt: 40 seconds.  Pt reports improvements in RUE coordination, still with c/o decreased endurance overall decreased RUE strength when performing overhead tasks (washing and brushing hair).  Therapy Documentation Precautions:  Precautions Precautions: Fall Precaution Comments: Mild R inattention Restrictions Weight Bearing Restrictions: No General:   Vital Signs: Therapy Vitals Pulse Rate: 82 BP: (!) 79/30 mmHg Patient Position (if appropriate): Sitting Oxygen  Therapy SpO2: 97 % O2 Device: Not Delivered Pain: Pain Assessment Pain Assessment: No/denies pain Pain Score: 0-No pain  See FIM for current functional status  Therapy/Group: Individual Therapy  Simonne Come 02/13/2014, 11:27 AM

## 2014-02-13 NOTE — Plan of Care (Signed)
Problem: RH Simple Meal Prep Goal: LTG Patient will perform simple meal prep w/assist (OT) LTG: Patient will perform simple meal prep with assistance, with/without cues (OT).  Downgraded due to visual impairments and unsafe to complete without supervision  Problem: RH Toilet Transfers Goal: LTG Patient will perform toilet transfers w/assist (OT) LTG: Patient will perform toilet transfers with assist, with/without cues using equipment (OT)  Downgraded due to visual impairments and decreased balance and balance reactions due to Rt knee instability  Problem: RH Tub/Shower Transfers Goal: LTG Patient will perform tub/shower transfers w/assist (OT) LTG: Patient will perform tub/shower transfers with assist, with/without cues using equipment (OT)  Downgraded due to visual impairments and decreased balance and balance reactions due to Rt knee instability

## 2014-02-14 ENCOUNTER — Inpatient Hospital Stay (HOSPITAL_COMMUNITY): Payer: Medicare Other | Admitting: Physical Therapy

## 2014-02-14 ENCOUNTER — Inpatient Hospital Stay (HOSPITAL_COMMUNITY): Payer: Medicare Other | Admitting: Occupational Therapy

## 2014-02-14 ENCOUNTER — Inpatient Hospital Stay (HOSPITAL_COMMUNITY): Payer: Medicare Other | Admitting: Speech Pathology

## 2014-02-14 DIAGNOSIS — I6381 Other cerebral infarction due to occlusion or stenosis of small artery: Secondary | ICD-10-CM | POA: Diagnosis present

## 2014-02-14 DIAGNOSIS — M25551 Pain in right hip: Secondary | ICD-10-CM

## 2014-02-14 HISTORY — DX: Other cerebral infarction due to occlusion or stenosis of small artery: I63.81

## 2014-02-14 MED ORDER — TRAZODONE HCL 50 MG PO TABS
25.0000 mg | ORAL_TABLET | Freq: Every evening | ORAL | Status: DC | PRN
  Administered 2014-02-14: 25 mg via ORAL
  Filled 2014-02-14: qty 1

## 2014-02-14 MED ORDER — WHITE PETROLATUM GEL
Status: DC | PRN
  Filled 2014-02-14: qty 5

## 2014-02-14 NOTE — Progress Notes (Signed)
Social Work Patient ID: Amanda Cochran, female   DOB: 1928-05-06, 80 y.o.   MRN: [id number] Spoke with Blair-PT pt is set on going home and not to a NH. Benjie Karvonen to get with other team members and come up with a discharge date to give to pt and nephew. See tomorrow.

## 2014-02-14 NOTE — Plan of Care (Signed)
Problem: RH Ambulation Goal: LTG Patient will ambulate in community environment (PT) LTG: Patient will ambulate in community environment, # of feet with assistance (PT).  Outcome: Not Applicable Date Met:  72/09/47 N/A due to change in D/C destination.

## 2014-02-14 NOTE — Progress Notes (Signed)
80 y.o. right handed female with history of hypertension and previous strokes with little residual weakness maintained on aspirin 325 mg daily and severe glaucoma with limited vision. Lives alone and uses a cane. He presented 02/03/2014 with slurred speech and right facial droop. MRI showed acute left basal ganglia infarct as well as moderate chronic small vessel ischemic disease. MRA with no evidence of major intracranial occlusion or stenosis. Patient did not receive TPA. Echocardiogram with ejection fraction of 40% without embolism. Carotid Doppler no ICA stenosis. Neurology services consulted presently on aspirin 81 mg daily as well as Aggrenox. Dysphagia 3 nectar thick liquid diet  Subjective/Complaints: Right hip  pain, no fall or trauma, wants to go home but no assist Review of Systems - Negative except as above  Objective: Vital Signs: Blood pressure 166/51, pulse 65, temperature 97.9 F (36.6 C), temperature source Oral, resp. rate 17, height 5' 6"  (1.676 m), weight 69.9 kg (154 lb 1.6 oz), SpO2 95 %. No results found. No results found for this or any previous visit (from the past 72 hour(s)).   HEENT: normal Cardio: irregular and no murmur or gallop Resp: CTA B/L and unlabored GI: BS positive and NT, ND Extremity:  Pulses positive and No Edema Skin:   Intact Neuro: Alert/Oriented, anxious Cranial Nerve II-XII normal, Normal Sensory, Abnormal Motor 5-/5 in R delt, bi, tri, grip, HF, KE, ADF, Abnormal FMC Ataxic/ dec FMC and Other cerebellar FNF intact.  Visual fields intact , EOMI, neg nystagmus Musc/Skel:  Normal, FROM right hip and knee Gen NAD   Assessment/Plan: 1. Functional deficits secondary to Left basal ganglia infarct which require 3+ hours per day of interdisciplinary therapy in a comprehensive inpatient rehab setting. Physiatrist is providing close team supervision and 24 hour management of active medical problems listed below. Physiatrist and rehab team continue to  assess barriers to discharge/monitor patient progress toward functional and medical goals. Team conference today please see physician documentation under team conference tab, met with team face-to-face to discuss problems,progress, and goals. Formulized individual treatment plan based on medical history, underlying problem and comorbidities. FIM: FIM - Bathing Bathing Steps Patient Completed: Chest, Right Arm, Left Arm, Abdomen, Front perineal area, Buttocks, Right upper leg, Left upper leg, Right lower leg (including foot), Left lower leg (including foot) Bathing: 5: Supervision: Safety issues/verbal cues  FIM - Upper Body Dressing/Undressing Upper body dressing/undressing steps patient completed: Thread/unthread right bra strap, Thread/unthread left bra strap, Hook/unhook bra, Thread/unthread right sleeve of pullover shirt/dresss, Thread/unthread left sleeve of pullover shirt/dress, Put head through opening of pull over shirt/dress, Pull shirt over trunk Upper body dressing/undressing: 6: More than reasonable amount of time FIM - Lower Body Dressing/Undressing Lower body dressing/undressing steps patient completed: Thread/unthread right underwear leg, Thread/unthread left underwear leg, Pull underwear up/down, Thread/unthread right pants leg, Thread/unthread left pants leg, Pull pants up/down, Don/Doff right sock, Don/Doff left sock, Don/Doff right shoe, Don/Doff left shoe, Fasten/unfasten right shoe, Fasten/unfasten left shoe Lower body dressing/undressing: 5: Set-up assist to: Don/Doff TED stocking  FIM - Toileting Toileting steps completed by patient: Performs perineal hygiene, Adjust clothing after toileting, Adjust clothing prior to toileting Toileting Assistive Devices: Grab bar or rail for support Toileting: 5: Supervision: Safety issues/verbal cues  FIM - Radio producer Devices: Elevated toilet seat, Grab bars Toilet Transfers: 5-To toilet/BSC: Supervision  (verbal cues/safety issues), 5-From toilet/BSC: Supervision (verbal cues/safety issues)  FIM - Control and instrumentation engineer Devices: Arm rests Bed/Chair Transfer: 5: Bed > Chair or W/C:  Supervision (verbal cues/safety issues), 5: Chair or W/C > Bed: Supervision (verbal cues/safety issues), 5: Supine > Sit: Supervision (verbal cues/safety issues), 5: Sit > Supine: Supervision (verbal cues/safety issues)  FIM - Locomotion: Wheelchair Locomotion: Wheelchair: 0: Activity did not occur FIM - Locomotion: Ambulation Locomotion: Ambulation Assistive Devices: Other (comment) Ambulation/Gait Assistance: 4: Min assist, 5: Supervision, 4: Min guard Locomotion: Ambulation: 4: Travels 150 ft or more with minimal assistance (Pt.>75%)  Comprehension Comprehension Mode: Auditory Comprehension: 6-Follows complex conversation/direction: With extra time/assistive device  Expression Expression Mode: Verbal Expression: 6-Expresses complex ideas: With extra time/assistive device  Social Interaction Social Interaction: 6-Interacts appropriately with others with medication or extra time (anti-anxiety, antidepressant).  Problem Solving Problem Solving: 5-Solves basic problems: With no assist  Memory Memory: 6-More than reasonable amt of time   Medical Problem List and Plan: 1. Functional deficits secondary to left basal ganglia infarct secondary to small vessel disease 2.  DVT Prophylaxis/Anticoagulation: Subcutaneous heparin. Monitor platelet counts and any signs of bleeding 3. Pain Management: Tylenol as needed, add voltaren gel R knee 4. Dysphagia. Dysphagia 3 and liquids. Monitor for any signs of aspiration. Follow-up speech therapy 5. Neuropsych: This patient is capable of making decisions on his own behalf. 6. Skin/Wound Care: Routine skin checks 7. Fluids/Electrolytes/Nutrition: Strict I and O's. Follow-up chemistries, 8. Hypertension. Lotensin 40 mg daily. Monitor with  increased mobility, will adjust meds 9. Mood/depression. Lexapro 10 mg daily. Provide emotional support 10. Glaucoma. Continue eyedrops as advised 11. Hypothyroidism. Synthroid 12. Hyperlipidemia. Zocor 13.  Arrythmia last 2 EKG show freq PVC, no rx, no Afib LOS (Days) 7 A FACE TO FACE EVALUATION WAS PERFORMED  , E 02/14/2014, 6:34 AM

## 2014-02-14 NOTE — Progress Notes (Signed)
Social Work Patient ID: Amanda Cochran, female   DOB: [birth year], 80 y.o.   MRN: [id number] Met with pt and spoke with nephew-Chris via telephone to discuss team conferece goals-supervision level and discharge Monday or Tuesday. She is feeling she can manage at home and wants to plan for this. Team feels she would benefit from going to a NH for a short time due to being alone at home.  Nephew feels whatever pt feels most comfortable with he will do.  Pt feels: " God will tell me what to do, I am a firm believer he tells [place] what  To do."  Will work on the discharge plan pt wants to pursue, she is capable of making her own decisions.  Blair-PT to discuss with pt at session today.

## 2014-02-14 NOTE — Plan of Care (Signed)
Problem: RH Car Transfers Goal: LTG Patient will perform car transfers with assist (PT) LTG: Patient will perform car transfers with assistance (PT).  N/A due to change in D/C plan

## 2014-02-14 NOTE — Progress Notes (Signed)
Speech Language Pathology Weekly Progress and Session Note  Patient Details  Name: Amanda Cochran MRN: [id number] Date of Birth: April 06, 1928  Beginning of progress report period: February 08, 2014 End of progress report period: February 14, 2014  Today's Date: 02/14/2014 SLP Individual Time: 0071-2197 SLP Individual Time Calculation (min): 30 min  Short Term Goals: Week 1: SLP Short Term Goal 1 (Week 1): Pt will tolerate presentations of her currently prescribed diet with no overt s/s of aspiration with Mod I cues for use of swallowing precautiosn  SLP Short Term Goal 2 (Week 1): Pt will tolerate trials of regular water following oral care per the water protocol with no overt s/s of aspiration with supervision cues.  SLP Short Term Goal 3 (Week 1): Pt will return demonstration of how to thicken liquids with Mod I  SLP Short Term Goal 4 (Week 1): Pt will improve recall of daily information via use of external aids over 80% of observable opportunities with Mod I  SLP Short Term Goal 5 (Week 1): Pt will improve speech intelligibility at the conversational level to >80% via compensatory strategies with Mod I.      New Short Term Goals: Week 2: SLP Short Term Goal 1 (Week 2): Pt will tolerate presentations of her currently prescribed diet with no overt s/s of aspiration with Mod I cues for use of swallowing precautions SLP Short Term Goal 2 (Week 2): Pt will tolerate trials of regular water following oral care per the water protocol with no overt s/s of aspiration with Mod I.  SLP Short Term Goal 3 (Week 2): Pt will improve recall of daily information via use of external aids over 80% of observable opportunities with supervision.  SLP Short Term Goal 4 (Week 2): Pt will improve recall of daily information via use of external aids over 80% of observable opportunities with supervision  Weekly Progress Updates:  Pt made small gains this reporting period and has met 2 out of 5 short term goals.  Pt  currently benefits from supervision cues for use of swallowing precautions.  SLP suspects that pt would be able to utilize swallowing precautions with mod I; however, pt has been resistant to decreasing supervision with meals to intermittent and states that she feels more "comfortable" having someone in there with her.  Pt has met long term and short term goals for speech intelligibility at the conversational level via spontaneous and natural use of universal compensatory strategies (slow rate, loud voice) and with improved facial sensation.  Pt currently requires overall min assist for recall of semi-complex new information.  Long term goals for memory downgraded to supervision as pt will likely need 24/7 supervision at discharge.  Anticipate that pt will also need at least initial assistance for medication and financial management at discharge.  Pt would continue to benefit from skilled ST while inpatient in order to maximize functional independence and reduce burden of care prior to discharge.     Intensity: Minumum of 1-2 x/day, 30 to 90 minutes Frequency: 5 out of 7 days Duration/Length of Stay: 7-10 days  Treatment/Interventions: Cognitive remediation/compensation;Cueing hierarchy;Functional tasks;Patient/family education;Oral motor exercises;Internal/external aids;Environmental controls   Daily Session  Skilled Therapeutic Interventions: Pt was seen for skilled ST targeting dysphagia goals.  Upon arrival, pt was seated upright in wheelchair, awake, alert and agreeable to participate in [place].  SLP completed skilled observations with trials of mixed solid and thin liquid consistencies to assess readiness for diet advancement.  Pt exhibited no overt s/s  of aspiration with teaspoons of water alone; however, when combined with solid cracker consistencies, pt exhibited an immediate cough x1 which lasted ~1 minute and which SLP suspects was related to residual solids.  Recommend that pt continue on dys 3  solids with nectar thick liquids.  Pt may also continue to have teaspoons of regular water per the water protocol.  Goals updated on this date to reflect current progress and plan of care.         FIM:  Comprehension Comprehension Mode: Auditory Comprehension: 6-Follows complex conversation/direction: With extra time/assistive device Expression Expression Mode: Verbal Expression: 6-Expresses complex ideas: With extra time/assistive device Social Interaction Social Interaction: 6-Interacts appropriately with others with medication or extra time (anti-anxiety, antidepressant). Problem Solving Problem Solving: 5-Solves basic problems: With no assist Memory Memory: 6-More than reasonable amt of time FIM - Eating Eating Activity: 5: Supervision/cues Pain Pain Assessment Pain Assessment: No/denies pain Pain Score: 0-No pain  Therapy/Group: Individual Therapy  , Selinda Orion 02/14/2014, 12:32 PM

## 2014-02-14 NOTE — Progress Notes (Signed)
Physical Therapy Session Note  Patient Details  Name: Amanda Cochran MRN: [id number] Date of Birth: [birth year]  Today's Date: 02/14/2014 PT Individual Time: 1000-1030 and 7846-9629 PT Individual Time Calculation (min): 30 min and 70 min  Short Term Goals: Week 1:  PT Short Term Goal 1 (Week 1): STG's = LTG's secondary to anticipated LOS.  Skilled Therapeutic Interventions/Progress Updates:    Treatment Session 1: Pt received seated in recliner; agreeable to therapy. Session focused on community mobility and trial of ambulation with assistive device. Pt performed functional ambulation 2 x150' in controlled and community environments (hospital solarium) with close supervision to min A secondary to occasional R genu recurvatum and LE's crossing during turning. When this PT cued pt to widen BOS during ambulation to prevent LOB, pt continues to reply, "I can't. I've been walking this way since I was a young girl." Pt able to consistently control R knee when cued to attend to this during gait. Upon returning to rehab unit, attempted functional ambulation with rolling walker, which pt performed x175' in controlled environment with close supervision to min guard. While using rolling walker, pt with no episodes of crossing LE's, no overt LOB, and no genu recurvatum. When asked if she would realistically utilize rolling walker for mobility upon discharge, pt replied, "At first I would." Of note, pt attempted to leave rolling walker outside bathroom door before ambulating into bathroom. Session ended in pt room, where pt was left seated in recliner with all needs within reach.  Treatment Session 2: Pt received seated EOB; agreeable to therapy. Session focused on dynamic standing balance, community mobility, and D/C planning. Pt performed gait 2 x200' in community environments with rolling walker and close supervision to min A. While ambulating in hospital lobby, pt required min A to recover from LOB to R side x3  episodes and ineffective obstacle negotiation on R side x2 episodes requiring max cueing and manual readjustment of rolling walker. Ambulated outside hospital lobby x150' with rolling walker with close supervision for majority of trial, min A to recover from LOB to R side secondary to bilat LE's crossing during turning.   Upon returning to rehab unit, pt negotiated 12 stairs laterally with bilat E support at R rail with step-to pattern and supervision. Completed Berg Balance Scale (BBS) with score of 39/56. See below for detailed findings. Educated pt on BBS score, functional implications, and score indication of significant fall risk. Pt verbalized understanding.   Explained recommendation for supervision at D/C based on significant risk of falling due to visual and balance impairments. Pt expressing desire to discharge home without supervision/assistance despite recommendations, stating, "I have to get my house ready to sell, then I'll decide if I want to go to a nursing home." Returned to pt room, where pt became very emotional about said discussion, causing decreased safety awareness with functional mobility. Departed with pt seated EOM with bed alarm on and all needs within reach.  Therapy Documentation Precautions:  Precautions Precautions: Fall Precaution Comments: R genu recurvatum Restrictions Weight Bearing Restrictions: No Vital Signs: Therapy Vitals Temp: 98.6 F (37 C) Temp Source: Oral Pulse Rate: 79 Resp: 17 BP: (!) 153/55 mmHg Patient Position (if appropriate): Lying Oxygen Therapy SpO2: 99 % O2 Device: Not Delivered Pain: Pain Assessment Pain Assessment: No/denies pain Pain Score: 0-No pain Locomotion : Ambulation Ambulation/Gait Assistance: 5: Supervision;4: Min guard; Min Assist Balance: Standardized Balance Assessment Standardized Balance Assessment: Berg Balance Test Berg Balance Test Sit to Stand: Able to stand without  using hands and stabilize  independently Standing Unsupported: Able to stand safely 2 minutes Sitting with Back Unsupported but Feet Supported on Floor or Stool: Able to sit safely and securely 2 minutes Stand to Sit: Sits safely with minimal use of hands Transfers: Able to transfer safely, minor use of hands Standing Unsupported with Eyes Closed: Able to stand 10 seconds safely Standing Ubsupported with Feet Together: Able to place feet together independently and stand for 1 minute with supervision From Standing, Reach Forward with Outstretched Arm: Can reach forward >12 cm safely (5") From Standing Position, Pick up Object from Floor: Unable to try/needs assist to keep balance (Ableto pick up but sustained significant LOB to R side immediately afterward) From Standing Position, Turn to Look Behind Over each Shoulder: Looks behind from both sides and weight shifts well Turn 360 Degrees: Able to turn 360 degrees safely but slowly (6 seconds to R' 4/5 seconds to L) Standing Unsupported, Alternately Place Feet on Step/Stool: Able to complete >2 steps/needs minimal assist (Able to complete 8 steps in 16 seconds but with LOB secondary to R toe catch) Standing Unsupported, One Foot in Front: Able to take small step independently and hold 30 seconds Standing on One Leg: Unable to try or needs assist to prevent fall Total Score: 39  See FIM for current functional status  Therapy/Group: Individual Therapy  Hobble, Malva Cogan 02/14/2014, 4:06 PM

## 2014-02-14 NOTE — Patient Care Conference (Signed)
Inpatient RehabilitationTeam Conference and Plan of Care Update Date: 02/14/2014   Time: 10;30 AM    Patient Name: Amanda Cochran      Medical Record Number: [id number]  Date of Birth: 1928/08/05 Sex: Female         Room/Bed: 4M03C/4M03C-01 Payor Info: Payor: MEDICARE / Plan: MEDICARE PART A AND B / Product Type: *No Product type* /    Admitting Diagnosis: L BG CVA  Admit Date/Time:  02/07/2014  1:55 PM Admission Comments: No comment available   Primary Diagnosis:  <principal problem not specified> Principal Problem: <principal problem not specified>  Patient Active Problem List   Diagnosis Date Noted  . Left sided lacunar stroke 02/14/2014  . Right hemiparesis 02/09/2014  . CVA (cerebral infarction) 02/07/2014  . Hyperlipidemia   . Essential hypertension 02/03/2014    Expected Discharge Date: Expected Discharge Date: 02/19/14  Team Members Present: Physician leading conference: Dr. Alysia Penna Social Worker Present: Ovidio Kin, LCSW Nurse Present: Elliot Cousin, RN PT Present: Raylene Everts, PT;Caroline Lacinda Axon, PT;Blair Hobble, PT OT Present: Simonne Come, OT SLP Present: Windell Moulding, SLP PPS Coordinator present : Daiva Nakayama, RN, CRRN     Current Status/Progress Goal Weekly Team Focus  Medical   Right knee recurvatum with pain  improve safety for ambulation  knee orthosis   Bowel/Bladder   Continent of bowel and bladder; LBM 12/22  Mod I   Maintain current regimen; assess for s/s of constipation and treat appropriately   Swallow/Nutrition/ Hydration   Dys 3 solids, nectar thick liquids, trials of regular water via teaspoon per the water protocol, full supervision for use of swallowing precautions   Mod I with least restrictive diet   trials of advanced consistencies, continued education related to the water protocol and thickening liquids   ADL's   supervision overall with seated and standing self-care tasks, supervision-min assist with ambulation and transfers due  to Rt knee pain and buckling  mod I overall, supervision mobility/transfers  functional mobility, transfers, and safety, RUE NMR   Mobility   Supervision bed mobility and transfers; Min A ambulation. Pt limited by R knee pain, genu recurvatum during gait.  Downgraded to supervision overall  R NMR with focus on motor control in R knee; gait training; compensatory strategies for visual impairments; increased activity tolerance   Communication   Supervision; minimal dysarthria    Mod I   education and carryover of compensatory strategies    Safety/Cognition/ Behavioral Observations  Supervision for recall of new, complex information  Mod I for use of compensatory strategies   education and carryover of strategies.    Pain   C/o pain in right knee to hip when working with therapy; tylenol given po prn prior to therapy; says she doesnt tolerate strong pain medication well  < 3  Assess and treat for pain q shift and prn   Skin   Abdomen bruised from heparin shots  Mod I  Assess skin q shift and prn      *See Care Plan and progress notes for long and short-term goals.  Barriers to Discharge: see above, no caregivers    Possible Resolutions to Barriers:  see above may need ST SNF    Discharge Planning/Teaching Needs:  Pt deciding about short term NHP versus going home.  Realizes she will be alone and may be better to go to NH for short time, then home. Needs to seel home before can go to ALF      Team Discussion:  Goals supervision level-BP issues yesterday. R-knee pain-consult for brace for stability.  Debating between NH versus going home, wants to go home. Trazadone decreased to 25 mg to help her sleep  Revisions to Treatment Plan:  Unsure if plan NH versus home   Continued Need for Acute Rehabilitation Level of Care: The patient requires daily medical management by a physician with specialized training in physical medicine and rehabilitation for the following conditions: Daily direction  of a multidisciplinary physical rehabilitation program to ensure safe treatment while eliciting the highest outcome that is of practical value to the patient.: Yes Daily medical management of patient stability for increased activity during participation in an intensive rehabilitation regime.: Yes Daily analysis of laboratory values and/or radiology reports with any subsequent need for medication adjustment of medical intervention for : Neurological problems;Other  , Gardiner Rhyme 02/14/2014, 1:12 PM

## 2014-02-14 NOTE — Progress Notes (Signed)
Social Work [name], LCSW Social Worker Signed  Patient Care Conference 02/14/2014  1:12 PM    Expand All Collapse All   Inpatient RehabilitationTeam Conference and Plan of Care Update Date: 02/14/2014   Time: 10;30 AM     Patient Name: Amanda Cochran      Medical Record Number: [id number]  Date of Birth: 1928-12-02 Sex: Female         Room/Bed: 4M03C/4M03C-01 Payor Info: Payor: MEDICARE / Plan: MEDICARE PART A AND B / Product Type: *No Product type* /    Admitting Diagnosis: L BG CVA   Admit Date/Time:  02/07/2014  1:55 PM Admission Comments: No comment available   Primary Diagnosis:  <principal problem not specified> Principal Problem: <principal problem not specified>    Patient Active Problem List     Diagnosis  Date Noted   .  Left sided lacunar stroke  02/14/2014   .  Right hemiparesis  02/09/2014   .  CVA (cerebral infarction)  02/07/2014   .  Hyperlipidemia     .  Essential hypertension  02/03/2014     Expected Discharge Date: Expected Discharge Date: 02/19/14  Team Members Present: Physician leading conference: Dr. Alysia Penna Social Worker Present: Ovidio Kin, LCSW Nurse Present: Elliot Cousin, RN PT Present: Raylene Everts, PT;Caroline Lacinda Axon, PT;Blair Hobble, PT OT Present: Simonne Come, OT SLP Present: Windell Moulding, SLP PPS Coordinator present : Daiva Nakayama, RN, CRRN        Current Status/Progress  Goal  Weekly Team Focus   Medical     Right knee recurvatum with pain  improve safety for ambulation  knee orthosis   Bowel/Bladder     Continent of bowel and bladder; LBM 12/22   Mod I   Maintain current regimen; assess for s/s of constipation and treat appropriately   Swallow/Nutrition/ Hydration     Dys 3 solids, nectar thick liquids, trials of regular water via teaspoon per the water protocol, full supervision for use of swallowing precautions   Mod I with least restrictive diet    trials of advanced consistencies, continued education related to the  water protocol and thickening liquids   ADL's     supervision overall with seated and standing self-care tasks, supervision-min assist with ambulation and transfers due to Rt knee pain and buckling  mod I overall, supervision mobility/transfers  functional mobility, transfers, and safety, RUE NMR   Mobility     Supervision bed mobility and transfers; Min A ambulation. Pt limited by R knee pain, genu recurvatum during gait.   Downgraded to supervision overall  R NMR with focus on motor control in R knee; gait training; compensatory strategies for visual impairments; increased activity tolerance    Communication     Supervision; minimal dysarthria     Mod I   education and carryover of compensatory strategies    Safety/Cognition/ Behavioral Observations    Supervision for recall of new, complex information  Mod I for use of compensatory strategies   education and carryover of strategies.     Pain     C/o pain in right knee to hip when working with therapy; tylenol given po prn prior to therapy; says she doesnt tolerate strong pain medication well  < 3  Assess and treat for pain q shift and prn   Skin     Abdomen bruised from heparin shots   Mod I  Assess skin q shift and prn      *See Care Plan and  progress notes for long and short-term goals.    Barriers to Discharge:  see above, no caregivers     Possible Resolutions to Barriers:   see above may need ST SNF     Discharge Planning/Teaching Needs:   Pt deciding about short term NHP versus going home.  Realizes she will be alone and may be better to go to NH for short time, then home. Needs to seel home before can go to ALF       Team Discussion:    Goals supervision level-BP issues yesterday. R-knee pain-consult for brace for stability.  Debating between NH versus going home, wants to go home. Trazadone decreased to 25 mg to help her sleep   Revisions to Treatment Plan:    Unsure if plan NH versus home    Continued Need for Acute  Rehabilitation Level of Care: The patient requires daily medical management by a physician with specialized training in physical medicine and rehabilitation for the following conditions: Daily direction of a multidisciplinary physical rehabilitation program to ensure safe treatment while eliciting the highest outcome that is of practical value to the patient.: Yes Daily medical management of patient stability for increased activity during participation in an intensive rehabilitation regime.: Yes Daily analysis of laboratory values and/or radiology reports with any subsequent need for medication adjustment of medical intervention for : Neurological problems;Other  [name] 02/14/2014, 1:12 PM                  Patient ID: Amanda Cochran, female   DOB: 08-14-1928, 80 y.o.   MRN: [id number]

## 2014-02-14 NOTE — Progress Notes (Signed)
Occupational Therapy Session Note  Patient Details  Name: Amanda Cochran MRN: [id number] Date of Birth: 20-Nov-1928  Today's Date: 02/14/2014 OT Individual Time: 0812-0902 OT Individual Time Calculation (min): 50 min    Short Term Goals: Week 1:  OT Short Term Goal 1 (Week 1): STG = LTGs due to ELOS  Skilled Therapeutic Interventions/Progress Updates:    Engaged in self-care retraining with focus on functional mobility and increased independence with self-care tasks.  Pt obtained clothing from dresser while seated EOB to increase safety.  Pt with no LOB or knee buckling with short ambulation from bed to shower with close supervision.  Bathing and hair washing completed at sit > stand level in walk-in shower with distant supervision.  Mod I with dressing except setup to apply TED hose.  Oral care completed in standing at sink and brushing hair seated in recliner.  Pt reports increased fatigue after bathing and washing hair, requiring increased rest breaks this session.  Therapy Documentation Precautions:  Precautions Precautions: Fall Precaution Comments: Mild R inattention Restrictions Weight Bearing Restrictions: No Pain: Pain Assessment Pain Assessment: No/denies pain Pain Score: 0-No pain  See FIM for current functional status  Therapy/Group: Individual Therapy  Simonne Come 02/14/2014, 10:24 AM

## 2014-02-14 NOTE — Progress Notes (Signed)
Orthopedic Tech Progress Note Patient Details:  Amanda Cochran [birth year] [id number]  Patient ID: Amanda Cochran, female   DOB: 1928/02/28, 80 y.o.   MRN: [id number] Called in advanced brace order; spoke with Jane Canary,  02/14/2014, 12:05 PM

## 2014-02-15 ENCOUNTER — Encounter (HOSPITAL_COMMUNITY): Payer: Medicare Other | Admitting: Occupational Therapy

## 2014-02-15 ENCOUNTER — Inpatient Hospital Stay (HOSPITAL_COMMUNITY): Payer: Medicare Other

## 2014-02-15 ENCOUNTER — Inpatient Hospital Stay (HOSPITAL_COMMUNITY): Payer: Medicare Other | Admitting: Speech Pathology

## 2014-02-15 ENCOUNTER — Inpatient Hospital Stay (HOSPITAL_COMMUNITY): Payer: Medicare Other | Admitting: Physical Therapy

## 2014-02-15 DIAGNOSIS — H4050X0 Glaucoma secondary to other eye disorders, unspecified eye, stage unspecified: Secondary | ICD-10-CM

## 2014-02-15 NOTE — Plan of Care (Signed)
Problem: RH Floor Transfers Goal: LTG Patient will perform floor transfers w/assist (PT) LTG: Patient will perform floor transfers with assistance (PT).  Outcome: Not Applicable Date Met:  49/86/51 Goal discharged secondary to increased pain in R knee with floor transfer.

## 2014-02-15 NOTE — Discharge Instructions (Signed)
Inpatient Rehab Discharge Instructions  Amanda Cochran Discharge date and time: No discharge date for patient encounter.   Activities/Precautions/ Functional Status: Activity: activity as tolerated Diet:  Wound Care: none needed Functional status:  ___ No restrictions     ___ Walk up steps independently _x STROKE/TIA DISCHARGE INSTRUCTIONS SMOKING Cigarette smoking nearly doubles your risk of having a stroke & is the single most alterable risk factor  If you smoke or have smoked in the last 12 months, you are advised to quit smoking for your health.  Most of the excess cardiovascular risk related to smoking disappears within a year of stopping.  Ask you doctor about anti-smoking medications  [place] Quit Line: 1-800-QUIT NOW  Free Smoking Cessation Classes [phone]  CHOLESTEROL Know your levels; limit fat & cholesterol in your diet  Lipid Panel     Component Value Date/Time   CHOL 188 02/04/2014 0705   TRIG 258* 02/04/2014 0705   HDL 50 02/04/2014 0705   CHOLHDL 3.8 02/04/2014 0705   VLDL 52* 02/04/2014 0705   LDLCALC 86 02/04/2014 0705      Many patients benefit from treatment even if their cholesterol is at goal.  Goal: Total Cholesterol (CHOL) less than 160  Goal:  Triglycerides (TRIG) less than 150  Goal:  HDL greater than 40  Goal:  LDL (LDLCALC) less than 100   BLOOD PRESSURE American Stroke Association blood pressure target is less that 120/80 mm/Hg  Your discharge blood pressure is:  BP: (!) 158/84 mmHg  Monitor your blood pressure  Limit your salt and alcohol intake  Many individuals will require more than one medication for high blood pressure  DIABETES (A1c is a blood sugar average for last 3 months) Goal HGBA1c is under 7% (HBGA1c is blood sugar average for last 3 months)  Diabetes: No known diagnosis of diabetes    Lab Results  Component Value Date   HGBA1C 6.0* 02/04/2014     Your HGBA1c can be lowered with medications, healthy diet, and  exercise.  Check your blood sugar as directed by your physician  Call your physician if you experience unexplained or low blood sugars.  PHYSICAL ACTIVITY/REHABILITATION Goal is 30 minutes at least 4 days per week  Activity: Increase activity slowly, Therapies: Physical Therapy: Home Health Return to work:   Activity decreases your risk of heart attack and stroke and makes your heart stronger.  It helps control your weight and blood pressure; helps you relax and can improve your mood.  Participate in a regular exercise program.  Talk with your doctor about the best form of exercise for you (dancing, walking, swimming, cycling).  DIET/WEIGHT Goal is to maintain a healthy weight  Your discharge diet is: DIET DYS 3  liquids Your height is:  Height: 5\' 6"  (167.6 cm) Your current weight is: Weight: 69.9 kg (154 lb 1.6 oz) Your Body Mass Index (BMI) is:  BMI (Calculated): 24.9  Following the type of diet specifically designed for you will help prevent another stroke.  Your goal weight range is:    Your goal Body Mass Index (BMI) is 19-24.  Healthy food habits can help reduce 3 risk factors for stroke:  High cholesterol, hypertension, and excess weight.  RESOURCES Stroke/Support Group:  Call [phone]   STROKE EDUCATION PROVIDED/REVIEWED AND GIVEN TO PATIENT Stroke warning signs and symptoms How to activate emergency medical system (call 911). Medications prescribed at discharge. Need for follow-up after discharge. Personal risk factors for stroke. Pneumonia vaccine given:  Flu vaccine given:  My questions have been answered, the writing is legible, and I understand these instructions.  I will adhere to these goals & educational materials that have been provided to me after my discharge from the hospital.    __ 24/7 supervision/assistance   ___ Walk up steps with assistance ___ Intermittent supervision/assistance  ___ Bathe/dress independently ___ Walk with walker     ___  Bathe/dress with assistance ___ Walk Independently    ___ Shower independently _x__ Walk with assistance    ___ Shower with assistance ___ No alcohol     ___ Return to work/school ________  Special Instructions:    COMMUNITY REFERRALS UPON DISCHARGE:    Home Health:   PT, OT, RN, [place] PCHEK:[phone] Date of last service:02/19/2014   Medical Equipment/Items [place]   GENERAL COMMUNITY RESOURCES FOR PATIENT/FAMILY: Support Groups:[facility] SUPPORT GROUP  My questions have been answered and I understand these instructions. I will adhere to these goals and the provided educational materials after my discharge from the hospital.  Patient/Caregiver Signature _______________________________ Date __________  Clinician Signature _______________________________________ Date __________  Please bring this form and your medication list with you to all your follow-up doctor's appointments.

## 2014-02-15 NOTE — Progress Notes (Signed)
Social Work Patient ID: Amanda Cochran, female   DOB: 07-24-1928, 80 y.o.   MRN: [id number] Met with pt who has told therapists she is going home from rehab.  Went and spoke with her and she tends to go back and forth between NH and home. She is aware of the team's concerns and recommendation of 24 hr supervision due to balance issues.  She feels her main issues is her eyes and rehab will not be working On this at a NH.  She really wants to go home and work at her own pace and also feels she will be doing more there, then in another facility.  Have informed her if plan is home then Her discharge is Monday 12/28.  Will contact nephew and inform him of this date, spoke with him yesterday about the team's concerns. He will do whatever Aunt wants. Informed pt will plan for home Monday but see her first thing to see if have changed her mind about going home.

## 2014-02-15 NOTE — Progress Notes (Signed)
Speech Language Pathology Daily Session Note  Patient Details  Name: Amanda Cochran MRN: [id number] Date of Birth: January 07, 1929  Today's Date: 02/15/2014 SLP Individual Time: 1440-1510 SLP Individual Time Calculation (min): 30 min  Short Term Goals: Week 2: SLP Short Term Goal 1 (Week 2): Pt will tolerate presentations of her currently prescribed diet with no overt s/s of aspiration with Mod I cues for use of swallowing precautions SLP Short Term Goal 2 (Week 2): Pt will tolerate trials of regular water following oral care per the water protocol with no overt s/s of aspiration with Mod I.  SLP Short Term Goal 3 (Week 2): Pt will improve recall of daily information via use of external aids over 80% of observable opportunities with supervision.  SLP Short Term Goal 4 (Week 2): Pt will improve recall of daily information via use of external aids over 80% of observable opportunities with supervision  Skilled Therapeutic Interventions: Skilled treatment session focused on addressing dysphagia goals.  SLP facilitated session with skilled observation of trials of water via teaspoon and cup with head neutral positioning and no overt s/s of aspiration.  SLP also facilitated session with request for patient to recall and return demonstration of how to thicken liquids to a nectar-thick consistency.  Patient utilized a previously made external aids and required Supervision verbal cues due to limited vision; however, she errored on the side of thickening liquids a little too thick.  SLP questions readiness for repeat objective assessment prior to discharge; however, will defer ultimate decision to primary SLP.      FIM:  Comprehension Comprehension Mode: Auditory Comprehension: 5-Follows basic conversation/direction: With extra time/assistive device Expression Expression Mode: Verbal Expression: 6-Expresses complex ideas: With extra time/assistive device Social Interaction Social Interaction: 6-Interacts  appropriately with others with medication or extra time (anti-anxiety, antidepressant). Problem Solving Problem Solving: 5-Solves basic problems: With no assist Memory Memory: 5-Recognizes or recalls 90% of the time/requires cueing < 10% of the time FIM - Eating Eating Activity: 5: Needs verbal cues/supervision  Pain Pain Assessment Pain Assessment: No/denies pain  Therapy/Group: Individual Therapy  Carmelia Roller., CCC-SLP [phone]  [place] 02/15/2014, 4:36 PM

## 2014-02-15 NOTE — Plan of Care (Signed)
Problem: RH Other (Specify) Goal: RH LTG Other (Specify) N/A due to pt report of planning to live on ground level of home at D/C.

## 2014-02-15 NOTE — Progress Notes (Signed)
Occupational Therapy Note  Patient Details  Name: Amanda Cochran MRN: [id number] Date of Birth: 19-Oct-1928  Today's Date: 02/15/2014 OT Individual Time: 1030-1100 OT Individual Time Calculation (min): 30 min   Pt denied pain Individual Therapy  Pt engaged in functional amb without AD for home mgmt tasks, practiced tub bench transfers, and dynamic standing activities.  Pt completed all tasks at supervision.  Pt did not experience any LOB during session.    Leotis Shames [facility] 02/15/2014, 2:28 PM

## 2014-02-15 NOTE — Progress Notes (Signed)
80 y.o. right handed female with history of hypertension and previous strokes with little residual weakness maintained on aspirin 325 mg daily and severe glaucoma with limited vision. Lives alone and uses a cane. He presented 02/03/2014 with slurred speech and right facial droop. MRI showed acute left basal ganglia infarct as well as moderate chronic small vessel ischemic disease. MRA with no evidence of major intracranial occlusion or stenosis. Patient did not receive TPA. Echocardiogram with ejection fraction of 40% without embolism. Carotid Doppler no ICA stenosis. Neurology services consulted presently on aspirin 81 mg daily as well as Aggrenox. Dysphagia 3 nectar thick liquid diet  Subjective/Complaints: Eyes feel funny , took "sleeping med"  Trazodone 20m last nocReview of Systems - Negative except as above  Objective: Vital Signs: Blood pressure 169/66, pulse 66, temperature 97.9 F (36.6 C), temperature source Oral, resp. rate 20, height _0  (1.676 m), weight 69.9 kg (154 lb 1.6 oz), SpO2 96 %. No results found. No results found for this or any previous visit (from the past 72 hour(s)).   HEENT: normal Cardio: irregular and no murmur or gallop Resp: CTA B/L and unlabored GI: BS positive and NT, ND Extremity:  Pulses positive and No Edema Skin:   Intact Neuro: Alert/Oriented, tired , Musc/Skel:  Normal, FROM right hip and knee Gen NAD   Assessment/Plan: 1. Functional deficits secondary to Left basal ganglia infarct which require 3+ hours per day of interdisciplinary therapy in a comprehensive inpatient rehab setting. Physiatrist is providing close team supervision and 24 hour management of active medical problems listed below. Physiatrist and rehab team continue to assess barriers to discharge/monitor patient progress toward functional and medical goals. Team conference today please see physician documentation under team conference tab, met with team face-to-face to discuss  problems,progress, and goals. Formulized individual treatment plan based on medical history, underlying problem and comorbidities. FIM: FIM - Bathing Bathing Steps Patient Completed: Chest, Right Arm, Left Arm, Abdomen, Front perineal area, Buttocks, Right upper leg, Left upper leg, Right lower leg (including foot), Left lower leg (including foot) Bathing: 5: Supervision: Safety issues/verbal cues  FIM - Upper Body Dressing/Undressing Upper body dressing/undressing steps patient completed: Thread/unthread right bra strap, Thread/unthread left bra strap, Hook/unhook bra, Thread/unthread right sleeve of pullover shirt/dresss, Thread/unthread left sleeve of pullover shirt/dress, Put head through opening of pull over shirt/dress, Pull shirt over trunk Upper body dressing/undressing: 6: More than reasonable amount of time FIM - Lower Body Dressing/Undressing Lower body dressing/undressing steps patient completed: Thread/unthread right underwear leg, Thread/unthread left underwear leg, Pull underwear up/down, Thread/unthread right pants leg, Thread/unthread left pants leg, Pull pants up/down, Don/Doff right sock, Don/Doff left sock, Don/Doff right shoe, Don/Doff left shoe, Fasten/unfasten right shoe, Fasten/unfasten left shoe Lower body dressing/undressing: 5: Set-up assist to: Don/Doff TED stocking  FIM - Toileting Toileting steps completed by patient: Performs perineal hygiene, Adjust clothing after toileting, Adjust clothing prior to toileting [place] Grab bar or rail for support Toileting: 5: Supervision: Safety issues/verbal cues  FIM - TRadio producerDevices: Elevated toilet seat, Grab bars, WInsurance account managerTransfers: 5-To toilet/BSC: Supervision (verbal cues/safety issues), 5-From toilet/BSC: Supervision (verbal cues/safety issues)  FIM - BControl and instrumentation engineerDevices: Arm rests, WCopy 5: Bed >  Chair or W/C: Supervision (verbal cues/safety issues), 5: Chair or W/C > Bed: Supervision (verbal cues/safety issues)  FIM - Locomotion: Wheelchair Locomotion: Wheelchair: 0: Activity did not occur FIM - Locomotion: Ambulation Locomotion: Ambulation Assistive Devices: WAdministrator  Ambulation/Gait Assistance: 5: Supervision, 4: Min guard, 4: Min assist Locomotion: Ambulation: 5: Travels 150 ft or more with supervision/safety issues  Comprehension Comprehension Mode: Auditory Comprehension: 6-Follows complex conversation/direction: With extra time/assistive device  Expression Expression Mode: Verbal Expression: 6-Expresses complex ideas: With extra time/assistive device  Social Interaction Social Interaction: 6-Interacts appropriately with others with medication or extra time (anti-anxiety, antidepressant).  Problem Solving Problem Solving: 5-Solves basic problems: With no assist  Memory Memory: 6-More than reasonable amt of time   Medical Problem List and Plan: 1. Functional deficits secondary to left basal ganglia infarct secondary to small vessel disease 2.  DVT Prophylaxis/Anticoagulation: Subcutaneous heparin. Monitor platelet counts and any signs of bleeding 3. Pain Management: Tylenol as needed, add voltaren gel R knee 4. Dysphagia. Dysphagia 3 and liquids. Monitor for any signs of aspiration. Follow-up speech therapy 5. Neuropsych: This patient is capable of making decisions on his own behalf. 6. Skin/Wound Care: Routine skin checks 7. Fluids/Electrolytes/Nutrition: Strict I and O's. Follow-up chemistries, 8. Hypertension. Lotensin 40 mg daily. Monitor with increased mobility, will adjust meds 9. Mood/depression. Lexapro 10 mg daily. Provide emotional support 10. Glaucoma. Continue eyedrops as advised , d/c trazodone given pt not sure what type of glaucoma she has 11. Hypothyroidism. Synthroid 12. Hyperlipidemia. Zocor  LOS (Days) 8 A FACE TO FACE EVALUATION WAS  PERFORMED  , E 02/15/2014, 5:48 AM

## 2014-02-15 NOTE — Progress Notes (Signed)
Physical Therapy Weekly Progress Note  Patient Details  Name: Amanda Cochran MRN: [id number] Date of Birth: [birth year]  Beginning of progress report period: February 08, 2014 End of progress report period: February 15, 2014  Today's Date: 02/15/2014 PT Individual Time: 1100-1200 PT Individual Time Calculation (min): 60 min   Patient has met 4 of 10 long term goals.  Short term goals not set due to estimated length of stay.   Patient continues to demonstrate the following deficits: decreased attention to right, decreased memory, derceased motor control in R knee, and decreased standing balance, decreased postural control, hemiplegia and decreased balance strategies. and therefore will continue to benefit from skilled PT intervention to enhance overall performance with activity tolerance, balance, postural control, ability to compensate for deficits, functional use of  right upper extremity and right lower extremity and coordination.  See Patient's Care Plan for progression toward long term goals.  Patient progressing toward long term goals..  Plan of care revisions: Discharged floor transfer goal secondary to increased R knee pain when attempted during previous session. D/C'ed goal addressing negotiation of 7 steps to aceess second floor of home due to pt report of plan to live on ground level; downgraded goal for memory to supervision/cueing due to inconsistent between-session carryover of cueing; and reactivated car transfer and community ambulations goals due to pt electing to D/C home despite therapy team recommendation for SNF due to need for supervision secondary to visual impairments.  Skilled Therapeutic Interventions/Progress Updates:   Pt received seated in bedside chair; agreeable to therapy. Session focused on community mobility, fall recovery. Explained, demonstrated how to don/doff R knee sleeve for increased tactile/proprioceptive input to prevent R genu recurvatum. Pt performed  functional ambulation  200' x3 trials (seated rest breaks required between trials) in community  environment with rolling walker and close supervision to min guard secondary to anterior LOB x3 episodes; pt able to self-recover from LOB x2 when negotiating door sills in hospital lobby but required min A to recover fromm LOB sustained during turning with rolling walker. Outside hospital lobby, pt negotiated single curb step with rolling walker x5 trials total. Initial 3 trials with min guard and verbal cueing for safe technique; final 2 trials with supervision, subtle cueing for technique.   Returned to rehab unit, where pt performed simulated car transfer x2 trials with rolling walker and supervision/cueing for safe technique during initial trial and supervision/setup for management of rolling walker during subsequent trial. In rehab apartment, pt performed floor transfer with mod A, mod cueing, and increased time secondary to increase in R knee pain with tall kneeling/quadruped positions. Said pain subsided when pt no longer in said positions. Session ended in pt room, where pt was left seated in recliner with all needs within reach.  Of note, pt stated, "I wish I'd fallen asleep and never woken up after this stroke," during today's session. Notified RN, who reports having already made recommendation for neuropsych consult for coping.  Therapy Documentation Precautions:  Precautions Precautions: Fall Precaution Comments: R genu recurvatum Restrictions Weight Bearing Restrictions: No Pain:  No pain before/after this session.  R knee pain during quadruped/tall kneeling positions resolved when pt no longer in said positions.  See FIM for current functional status  Therapy/Group: Individual Therapy  , Malva Cogan 02/15/2014, 1:09 PM

## 2014-02-15 NOTE — Progress Notes (Signed)
Occupational Therapy Weekly Progress Note  Patient Details  Name: Amanda Cochran MRN: [id number] Date of Birth: 1928/09/18  Beginning of progress report period: February 08, 2014 End of progress report period: February 15, 2014  Today's Date: 02/15/2014 OT Individual Time: 1300-1400 OT Individual Time Calculation (min): 60 min    Short term goals not set due to estimated length of stay.  Pt progressing towards LTGs.  Recommend supervision for transfers and all mobility secondary to visual impairments and decreased balance.  Pt continues to require occasional min assist with mobility and setup for self-care tasks.  Patient continues to demonstrate the following deficits: visual impairments, Rt sided weakness, decreased awareness of deficits and therefore will continue to benefit from skilled OT intervention to enhance overall performance with BADL and Reduce care partner burden.  See Patient's Care Plan for progression toward long term goals.  Patient progressing toward long term goals..  Continue plan of care.  Skilled Therapeutic Interventions/Progress Updates:    Engaged in self-care retraining with focus on functional mobility and increased independence with self-care tasks. Pt ambulated to dresser to obtain clothing prior to bathing at sit > stand level in room shower. Pt with no LOB or knee buckling with short ambulation from bed to shower with close supervision with shoes donned and knee sleeve. Mod I with dressing this session, including donning TEDS and Rt knee brace. Oral care completed in standing at sink and brushing hair seated in recliner. Set up with water after oral hygiene to engage in drinking via water protocol.  Pt reports satisfied with sessions this date, reporting feeling the best she has had since being in the hospital.  Therapy Documentation Precautions:  Precautions Precautions: Fall Precaution Comments: R genu recurvatum Restrictions Weight Bearing  Restrictions: No General:   Vital Signs: Therapy Vitals Temp: 97.8 F (36.6 C) Temp Source: Oral Pulse Rate: 74 Resp: 17 BP: (!) 148/78 mmHg Patient Position (if appropriate): Sitting Oxygen Therapy SpO2: 96 % O2 Device: Not Delivered Pain:  Pt with no c/o pain  See FIM for current functional status  Therapy/Group: Individual Therapy  Simonne Come 02/15/2014, 3:34 PM

## 2014-02-16 DIAGNOSIS — I63011 Cerebral infarction due to thrombosis of right vertebral artery: Secondary | ICD-10-CM

## 2014-02-16 NOTE — Progress Notes (Signed)
80 y.o. right handed female with history of hypertension and previous strokes with little residual weakness maintained on aspirin 325 mg daily and severe glaucoma with limited vision. Lives alone and uses a cane. He presented 02/03/2014 with slurred speech and right facial droop. MRI showed acute left basal ganglia infarct as well as moderate chronic small vessel ischemic disease. MRA with no evidence of major intracranial occlusion or stenosis. Patient did not receive TPA. Echocardiogram with ejection fraction of 40% without embolism. Carotid Doppler no ICA stenosis. Neurology services consulted presently on aspirin 81 mg daily as well as Aggrenox. Dysphagia 3 nectar thick liquid diet  Subjective/Complaints: No issues , knee felt sore yesterday in PT but improved with KO    Review of Systems - Negative except as above  Objective: Vital Signs: Blood pressure 164/64, pulse 68, temperature 97.5 F (36.4 C), temperature source Oral, resp. rate 19, height 5' 6"  (1.676 m), weight 69.9 kg (154 lb 1.6 oz), SpO2 99 %. No results found. No results found for this or any previous visit (from the past 72 hour(s)).   HEENT: normal Cardio: irregular and no murmur or gallop Resp: CTA B/L and unlabored GI: BS positive and NT, ND Extremity:  Pulses positive and No Edema Skin:   Intact Neuro: Alert/Oriented, tired , Musc/Skel:  Normal, FROM right hip and knee Gen NAD   Assessment/Plan: 1. Functional deficits secondary to Left basal ganglia infarct which require 3+ hours per day of interdisciplinary therapy in a comprehensive inpatient rehab setting. Physiatrist is providing close team supervision and 24 hour management of active medical problems listed below. Physiatrist and rehab team continue to assess barriers to discharge/monitor patient progress toward functional and medical goals. Team conference today please see physician documentation under team conference tab, met with team face-to-face to discuss  problems,progress, and goals. Formulized individual treatment plan based on medical history, underlying problem and comorbidities. FIM: FIM - Bathing Bathing Steps Patient Completed: Chest, Right Arm, Left Arm, Abdomen, Front perineal area, Buttocks, Right upper leg, Left upper leg, Right lower leg (including foot), Left lower leg (including foot) Bathing: 6: Assistive device (Comment)  FIM - Upper Body Dressing/Undressing Upper body dressing/undressing steps patient completed: Thread/unthread right bra strap, Thread/unthread left bra strap, Hook/unhook bra, Thread/unthread right sleeve of pullover shirt/dresss, Thread/unthread left sleeve of pullover shirt/dress, Put head through opening of pull over shirt/dress, Pull shirt over trunk Upper body dressing/undressing: 6: More than reasonable amount of time FIM - Lower Body Dressing/Undressing Lower body dressing/undressing steps patient completed: Don/Doff right sock, Don/Doff left sock, Don/Doff right shoe, Don/Doff left shoe, Fasten/unfasten right shoe, Fasten/unfasten left shoe Lower body dressing/undressing: 6: More than reasonable amount of time  FIM - Toileting Toileting steps completed by patient: Performs perineal hygiene, Adjust clothing after toileting, Adjust clothing prior to toileting Toileting Assistive Devices: Grab bar or rail for support Toileting: 5: Supervision: Safety issues/verbal cues  FIM - Radio producer Devices: Elevated toilet seat, Grab bars Toilet Transfers: 5-To toilet/BSC: Supervision (verbal cues/safety issues), 5-From toilet/BSC: Supervision (verbal cues/safety issues)  FIM - Control and instrumentation engineer Devices: Arm rests, Copy: 5: Bed > Chair or W/C: Supervision (verbal cues/safety issues), 5: Chair or W/C > Bed: Supervision (verbal cues/safety issues)  FIM - Locomotion: Wheelchair Locomotion: Wheelchair: 0: Activity did not occur FIM -  Locomotion: Ambulation Locomotion: Ambulation Assistive Devices: Administrator Ambulation/Gait Assistance: 5: Supervision, 4: Min guard, 4: Min assist Locomotion: Ambulation: 5: Travels 150 ft or more with  supervision/safety issues  Comprehension Comprehension Mode: Auditory Comprehension: 5-Set-up assist with hearing device  Expression Expression Mode: Verbal Expression: 6-Expresses complex ideas: With extra time/assistive device  Social Interaction Social Interaction: 6-Interacts appropriately with others with medication or extra time (anti-anxiety, antidepressant).  Problem Solving Problem Solving: 5-Solves basic problems: With no assist  Memory Memory: 6-More than reasonable amt of time   Medical Problem List and Plan: 1. Functional deficits secondary to left basal ganglia infarct secondary to small vessel disease 2.  DVT Prophylaxis/Anticoagulation: Subcutaneous heparin. Monitor platelet counts and any signs of bleeding 3. Pain Management: Tylenol as needed, add voltaren gel R knee 4. Dysphagia. Dysphagia 3 and liquids. Monitor for any signs of aspiration. Follow-up speech therapy 5. Neuropsych: This patient is capable of making decisions on his own behalf. 6. Skin/Wound Care: Routine skin checks 7. Fluids/Electrolytes/Nutrition: Strict I and O's. Follow-up chemistries, 8. Hypertension. Lotensin 40 mg daily. Monitor with increased mobility, will adjust meds 9. Mood/depression. Lexapro 10 mg daily. Provide emotional support 10. Glaucoma. Continue eyedrops as advised , d/c trazodone given pt not sure what type of glaucoma she has 11. Hypothyroidism. Synthroid 12. Hyperlipidemia. Zocor  LOS (Days) 9 A FACE TO FACE EVALUATION WAS PERFORMED  , E 02/16/2014, 8:41 AM

## 2014-02-16 NOTE — Plan of Care (Signed)
Problem: RH Memory Goal: LTG Patient will use memory compensatory aids to (SLP) LTG: Patient will use memory compensatory aids to recall biographical/new, daily complex information with cues (SLP)  Downgraded 12/25 NP

## 2014-02-17 ENCOUNTER — Inpatient Hospital Stay (HOSPITAL_COMMUNITY): Payer: Medicare Other | Admitting: Physical Therapy

## 2014-02-17 ENCOUNTER — Inpatient Hospital Stay (HOSPITAL_COMMUNITY): Payer: Medicare Other | Admitting: Occupational Therapy

## 2014-02-17 ENCOUNTER — Inpatient Hospital Stay (HOSPITAL_COMMUNITY): Payer: Medicare Other | Admitting: Speech Pathology

## 2014-02-17 ENCOUNTER — Inpatient Hospital Stay (HOSPITAL_COMMUNITY): Payer: Medicare Other | Admitting: *Deleted

## 2014-02-17 NOTE — Progress Notes (Signed)
Speech Language Pathology Daily Session Note  Patient Details  Name: Amanda Cochran MRN: [id number] Date of Birth: 22-Aug-1928  Today's Date: 02/17/2014 SLP Individual Time: 7530-0511 SLP Individual Time Calculation (min): 30 min  Short Term Goals: Week 2: SLP Short Term Goal 1 (Week 2): Pt will tolerate presentations of her currently prescribed diet with no overt s/s of aspiration with Mod I cues for use of swallowing precautions SLP Short Term Goal 2 (Week 2): Pt will tolerate trials of regular water following oral care per the water protocol with no overt s/s of aspiration with Mod I.  SLP Short Term Goal 3 (Week 2): Pt will improve recall of daily information via use of external aids over 80% of observable opportunities with supervision.  SLP Short Term Goal 4 (Week 2): Pt will improve recall of daily information via use of external aids over 80% of observable opportunities with supervision  Skilled Therapeutic Interventions:  Pt was seen for skilled ST targeting dysphagia goals and ongoing training prior to discharge.  Pt continues to report that she wants to go home on Monday.  SLP reviewed and reinforced skilled education related to recommendations for pt's currently prescribed diet and the water protocol.  Pt recalled the 5 parameters of the water protocol and returned demonstration of how to thicken liquids appropriately with Mod I.  SLP completed skilled observations with trials of cup sips of regular water per the water protocol with pt exhibiting no overt s/s of aspiration.  SLP also facilitated the session with continued trials of mixed solid and thin liquid consistencies with pt exhibiting marked improvements in clinical s/s of airway protection; no overt s/s of aspiration.  Pt is making good progress towards meeting goals.  Continue per current plan of care.   FIM:  Comprehension Comprehension Mode: Auditory Comprehension: 5-Follows basic conversation/direction: With extra  time/assistive device Expression Expression Mode: Verbal Expression: 5-Expresses basic needs/ideas: With no assist Social Interaction Social Interaction: 6-Interacts appropriately with others with medication or extra time (anti-anxiety, antidepressant). Problem Solving Problem Solving: 5-Solves basic 90% of the time/requires cueing < 10% of the time Memory Memory: 5-Recognizes or recalls 90% of the time/requires cueing < 10% of the time FIM - Eating Eating Activity: 5: Supervision/cues  Pain Pain Assessment Pain Assessment: No/denies pain  Therapy/Group: Individual Therapy  , Selinda Orion 02/17/2014, 1:24 PM

## 2014-02-17 NOTE — Progress Notes (Signed)
80 y.o. right handed female with history of hypertension and previous strokes with little residual weakness maintained on aspirin 325 mg daily and severe glaucoma with limited vision. Lives alone and uses a cane. He presented 02/03/2014 with slurred speech and right facial droop. MRI showed acute left basal ganglia infarct as well as moderate chronic small vessel ischemic disease. MRA with no evidence of major intracranial occlusion or stenosis. Patient did not receive TPA. Echocardiogram with ejection fraction of 40% without embolism. Carotid Doppler no ICA stenosis. Neurology services consulted presently on aspirin 81 mg daily as well as Aggrenox. Dysphagia 3 nectar thick liquid diet  Subjective/Complaints: Patient is ambulating in the hallways with assistance. She denies pain.  Objective: Vital Signs: Blood pressure 156/65, pulse 70, temperature 97.7 F (36.5 C), temperature source Oral, resp. rate 18, height 5\' 6"  (1.676 m), weight 154 lb 1.6 oz (69.9 kg), SpO2 97 %. Elderly female in no acute distress. She is ambulating with assistance. Chest is clear to auscultation. Cardiac exam S1 and S2 are regular. Abdominal exam active bowel sounds, soft. Extremities no edema.  Assessment/Plan: 1. Functional deficits secondary to Left basal ganglia infarct    Medical Problem List and Plan: 1. Functional deficits secondary to left basal ganglia infarct secondary to small vessel disease 2.  DVT Prophylaxis/Anticoagulation: Subcutaneous heparin. Monitor platelet counts and any signs of bleeding 3. Pain Management: Tylenol as needed, add voltaren gel R knee 4. Dysphagia. Dysphagia 3 and liquids. Monitor for any signs of aspiration. Follow-up speech therapy 5. Neuropsych: This patient is capable of making decisions on his own behalf. 6. Skin/Wound Care: Routine skin checks 7. Fluids/Electrolytes/Nutrition: Strict I and O's. Follow-up chemistries, 8. Hypertension. Lotensin 40 mg daily.  Blood pressure  ranging 156-171/ 65-79 9. Mood/depression. Lexapro 10 mg daily. Provide emotional support 10. Glaucoma. Continue eyedrops as advised , d/c trazodone given pt not sure what type of glaucoma she has 11. Hypothyroidism. Synthroid 12. Hyperlipidemia. Zocor  LOS (Days) 10 A FACE TO FACE EVALUATION WAS PERFORMED  , HENRY 02/17/2014, 9:14 AM

## 2014-02-17 NOTE — Progress Notes (Signed)
Physical Therapy Session Note  Patient Details  Name: Amanda Cochran MRN: [id number] Date of Birth: 1928/06/11  Today's Date: 02/17/2014 PT Concurrent Time: 1400-1500 PT Concurrent Time Calculation (min): 60 min with second 30 minutes being concurrent therapy time  Short Term Goals: Week 1:  PT Short Term Goal 1 (Week 1): STG's = LTG's secondary to anticipated LOS. Week 2:  PT Short Term Goal 1 (Week 2): STG's = LTG's secondary to anticipated LOS. Planned D/C is now 12/28.  Skilled Therapeutic Interventions/Progress Updates:   Pt received in gym; pt reports she is to D/C home on Monday alone.  Pt reports at home she typically fixes a breakfast but has lunch and dinner delivered to her. Pt agreeable to performing functional kitchen task in ADL kitchen.  Focus of task on pt independently fixing a bowl of oatmeal for "breakfast".  Pt noted to be more unsafe with RW in kitchen due to trying to negotiate RW with one UE (other UE engaged in carrying bowl or packet of oatmeal) and hitting objects in kitchen with wheel.  Pt placed RW at one end of kitchen and utilized counter top for stabilization while retrieving bowl, oatmeal, and spoon and then assembling oatmeal.  Pt able to successfully complete full preparation task with supervision to locate all items and without use of RW with no episodes of LOB and was even able to perform bimanual task of taking bowl to/from microwave.  Returned to gym and performed UE and LE strengthening and endurance training on Nustep at level 5 resistance x 7 minutes.  Participated in concurrent session where pt performed dynamic standing balance activities of side stepping and ball bounce passing as well static standing balance against perturbations with pt standing in front of another patient and holding green theraband; pt required to stabilize while other patient pulled against her performing UE exercises.  Pt tolerated well and returned to room with therapy tech.        Therapy Documentation Precautions:  Precautions Precautions: Fall Precaution Comments: R genu recurvatum Restrictions Weight Bearing Restrictions: No Vital Signs: Therapy Vitals Temp: 98.1 F (36.7 C) Temp Source: Oral Pulse Rate: 62 Resp: 19 BP: (!) 148/72 mmHg Patient Position (if appropriate): Sitting Oxygen Therapy SpO2: 98 % O2 Device: Not Delivered Pain: Pain Assessment Pain Assessment: No/denies pain Locomotion : Ambulation Ambulation/Gait Assistance: 5: Supervision   See FIM for current functional status  Therapy/Group: individual and concurrent therapy  [name] 02/17/2014, 3:25 PM

## 2014-02-17 NOTE — Progress Notes (Signed)
Occupational Therapy Session Note  Patient Details  Name: Amanda Cochran MRN: [id number] Date of Birth: May 10, 1928  Today's Date: 02/17/2014 OT Individual Time: 0800-0900 OT Individual Time Calculation (min): 60 min    Short Term Goals: No short term goals set  Skilled Therapeutic Interventions/Progress Updates:    Engaged in self-care retraining with focus on increased safety and independence with mobility and self-care tasks.  Encouraged pt to utilize RW with all mobility to increase safety due to instability at Rt knee.  Pt gathered all clothing and items prior to bathing at sit > stand in shower with use of RW to transport items.  No LOB or knee buckling during self-care tasks, completed bathing and dressing at Mod I level.  Grooming tasks completed in standing at Mod I level.  Pt reports nephew will pick her up upon d/c and plan to stay the day and first night with her.  Therapy Documentation Precautions:  Precautions Precautions: Fall Precaution Comments: R genu recurvatum Restrictions Weight Bearing Restrictions: No Pain:   Pt with no c/o pain  See FIM for current functional status  Therapy/Group: Individual Therapy  Simonne Come 02/17/2014, 10:07 AM

## 2014-02-17 NOTE — Progress Notes (Signed)
Occupational Therapy Session Note  Patient Details  Name: TRACI GAFFORD MRN: [id number] Date of Birth: [birth year]  Today's Date: 02/17/2014 OT Individual Time:  -  53-1630  30 ,min   Short Term Goals: Week 1:  OT Short Term Goal 1 (Week 1): STG = LTGs due to ELOS Week 2:     Skilled Therapeutic Interventions/Progress Updates:    Engaged in UE exercises using theraband.  Provided exercises and hand outs.  Pt. Returned demonstration of each exercise x10 and held for 10 seconds.    Left pt in recliner with all needs in reach.    Therapy Documentation Precautions:  Precautions Precautions: Fall Precaution Comments: R genu recurvatum Restrictions Weight Bearing Restrictions: No :   Vital Signs:  Pain: Pain Assessment Pain Assessment: No/denies pain           See FIM for current functional status  Therapy/Group: Individual Therapy  Lisa Roca 02/17/2014, 4:26 PM

## 2014-02-18 MED ORDER — HYDRALAZINE HCL 25 MG PO TABS
25.0000 mg | ORAL_TABLET | Freq: Three times a day (TID) | ORAL | Status: DC
  Administered 2014-02-18 – 2014-02-19 (×4): 25 mg via ORAL
  Filled 2014-02-18 (×7): qty 1

## 2014-02-18 NOTE — Progress Notes (Signed)
80 y.o. right handed female with history of hypertension and previous strokes with little residual weakness maintained on aspirin 325 mg daily and severe glaucoma with limited vision. Lives alone and uses a cane. He presented 02/03/2014 with slurred speech and right facial droop. MRI showed acute left basal ganglia infarct as well as moderate chronic small vessel ischemic disease. MRA with no evidence of major intracranial occlusion or stenosis. Patient did not receive TPA. Echocardiogram with ejection fraction of 40% without embolism. Carotid Doppler no ICA stenosis. Neurology services consulted presently on aspirin 81 mg daily as well as Aggrenox. Dysphagia 3 nectar thick liquid diet  Subjective/Complaints: Patient is sitting at the nurses station eating breakfast. No complaints.  Objective: Vital Signs: Blood pressure 182/81, pulse 65, temperature 97.4 F (36.3 C), temperature source Oral, resp. rate 20, height 5\' 6"  (1.676 m), weight 154 lb 1.6 oz (69.9 kg), SpO2 99 %.  Elderly female sitting at the nurses station eating breakfast. There is no increased work of breathing. Cardiac exam S1 and S2 are regular. Extremities without significant edema.  Assessment/Plan: 1. Functional deficits secondary to Left basal ganglia infarct    Medical Problem List and Plan: 1. Functional deficits secondary to left basal ganglia infarct secondary to small vessel disease 2.  DVT Prophylaxis/Anticoagulation: Subcutaneous heparin. Monitor platelet counts and any signs of bleeding 3. Pain Management: Adequately controlled. 4. Dysphagia. Dysphagia 3 and liquids. Monitor for any signs of aspiration. Follow-up speech therapy 5. Neuropsych: This patient is capable of making decisions on his own behalf. 6. Skin/Wound Care: Routine skin checks 7. Fluids/Electrolytes/Nutrition: Strict I and O's.  Basic Metabolic Panel:    Component Value Date/Time   NA 141 02/08/2014 0634   K 4.3 02/08/2014 0634   CL 102  02/08/2014 0634   CO2 27 02/08/2014 0634   BUN 22 02/08/2014 0634   CREATININE 0.81 02/08/2014 0634   GLUCOSE 115* 02/08/2014 0634   CALCIUM 9.2 02/08/2014 0634    8. Hypertension. Lotensin 40 mg daily.  Blood pressure ranging 148-182/72-81 She is already on high-dose ACE inhibitor and high dose calcium channel blocker. I'll add low dose hydralazine. 9. Mood/depression. Lexapro 10 mg daily. Provide emotional support 10. Glaucoma. Continue eyedrops as advised , d/c trazodone given pt not sure what type of glaucoma she has 11. Hypothyroidism. Synthroid 12. Hyperlipidemia. Zocor  LOS (Days) 11 A FACE TO FACE EVALUATION WAS PERFORMED  SWORDS,BRUCE HENRY 02/18/2014, 7:42 AM

## 2014-02-18 NOTE — Progress Notes (Signed)
Good nights sleep, without complaint of. Amanda Cochran

## 2014-02-19 ENCOUNTER — Inpatient Hospital Stay (HOSPITAL_COMMUNITY): Payer: Medicare Other | Admitting: Physical Therapy

## 2014-02-19 ENCOUNTER — Inpatient Hospital Stay (HOSPITAL_COMMUNITY): Payer: Medicare Other | Admitting: Speech Pathology

## 2014-02-19 MED ORDER — ASPIRIN-DIPYRIDAMOLE ER 25-200 MG PO CP12
1.0000 | ORAL_CAPSULE | Freq: Two times a day (BID) | ORAL | Status: DC
  Filled 2014-02-19 (×3): qty 1

## 2014-02-19 MED ORDER — BENAZEPRIL HCL 40 MG PO TABS: 40.0000 mg | ORAL_TABLET | Freq: Every day | ORAL | Status: DC

## 2014-02-19 MED ORDER — LEVOTHYROXINE SODIUM 100 MCG PO TABS: 100.0000 ug | ORAL_TABLET | Freq: Every day | ORAL | Status: AC

## 2014-02-19 MED ORDER — AMLODIPINE BESYLATE 10 MG PO TABS: 10.0000 mg | ORAL_TABLET | Freq: Every day | ORAL | Status: DC

## 2014-02-19 MED ORDER — ASPIRIN-DIPYRIDAMOLE ER 25-200 MG PO CP12: 1.0000 | ORAL_CAPSULE | Freq: Every day | ORAL | Status: DC

## 2014-02-19 MED ORDER — HYDRALAZINE HCL 25 MG PO TABS: 25.0000 mg | ORAL_TABLET | Freq: Three times a day (TID) | ORAL | Status: DC

## 2014-02-19 MED ORDER — ASPIRIN-DIPYRIDAMOLE ER 25-200 MG PO CP12: 1.0000 | ORAL_CAPSULE | Freq: Two times a day (BID) | ORAL | Status: DC

## 2014-02-19 NOTE — Progress Notes (Signed)
Speech Language Pathology Discharge Summary  Patient Details  Name: Amanda Cochran MRN: [id number] Date of Birth: April 27, 1928  Today's Date: 02/19/2014 SLP Individual Time: 0902-0930 SLP Individual Time Calculation (min): 28 min   Skilled Therapeutic Interventions:  Pt was seen for skilled ST targeting education prior to discharge. Pt was mod I for recall of how to appropriately thicken liquids as well as the parameters of the water protocol via teach back methods to simulate pt providing education to her paid caregiver at home.  SLP reviewed and reinforced where to buy thickener.  Pt already has Scientist, clinical (histocompatibility and immunogenetics) for thickened liquids and SLP provided an additional handout of dys 3 solids, to improve compliance with recommended diet, even with mobile meals.  Pt verbalized understanding of education.      Patient has met 5 of 5 long term goals.  Patient to discharge at overall Supervision;Modified Independent level.  Reasons goals not met: n/a   Clinical Impression/Discharge Summary:  Pt made functional gains while inpatient and is discharging having met 5 out of 5 long term goals.  Pt is likely close to her cognitive baseline and is supervision level assist for recall of daily information via compensatory aids.  Additionally, pt is Mod I for use of swallowing precautions to minimize overt s/s of aspiration with nectar thick liquids and dys 3 solids and has been tolerating trials of regular water via cup sips per the water protocol.  Pt with marked improvements in clinical s/s of aspiration over the last several consecutive treatment sessions and will likely be ready for a liquids advancement soon; however, SLP will defer diet advancement to SLP at next level of care to allow time for follow up and assessment of diet tolerance given the recent nature of pt's improvements.  Pt education is complete.    Care Partner:  Caregiver Able to Provide Assistance: Yes  Type of Caregiver Assistance:  Physical;Cognitive  Recommendation:  Home Health SLP (intermittent supervision )  Rationale for SLP Follow Up: Maximize swallowing safety   Equipment: none recommended by SLP    Reasons for discharge: Discharged from hospital   Patient/Family Agrees with Progress Made and Goals Achieved: Yes   See FIM for current functional status  Emilio Math 02/19/2014, 12:33 PM

## 2014-02-19 NOTE — Discharge Summary (Signed)
Amanda Cochran, Amanda Cochran                  ACCOUNT NO.:  [id number]  MEDICAL RECORD NO.:  [id number]  LOCATION:  4M03C                        FACILITY:  [facility]  PHYSICIAN:  Charlett Blake, M.D.DATE OF BIRTH:  08-07-1928  DATE OF ADMISSION:  02/07/2014 DATE OF DISCHARGE:                              DISCHARGE SUMMARY   DISCHARGE DIAGNOSES: 1. Functional deficits secondary to left basal ganglia infarct due to     small vessel disease. 2. Subcutaneous heparin for DVT prophylaxis. 3. Dysphagia. 4. Hypertension. 5. Depression. 6. Glaucoma. 7. Hypothyroidism. 8. Hyperlipidemia.  HISTORY OF PRESENT ILLNESS:  This 80 year old right-handed female with history of hypertension, previous CVA maintained on aspirin therapy who lives alone and used a cane prior to admission.  Presented February 03, 2014, with slurred speech and right facial droop.  MRI of the head showed acute left basal ganglia infarct as well as moderate chronic small vessel ischemic changes.  MRA with no evidence of major intracranial occlusion or stenosis.  The patient did not receive tPA. Echocardiogram with ejection fraction of 40% without embolism.  Carotid Doppler's with no ICA stenosis.  Neurology Service was consulted, placed on aspirin as well as a Aggrenox for CVA prophylaxis.  Dysphagia three nectar thick liquids.  Subcutaneous heparin for DVT prophylaxis. Physical and occupational therapy on going.  The patient was admitted for a comprehensive rehab program.  PAST MEDICAL HISTORY:  See discharge diagnoses.  SOCIAL HISTORY:  Lives alone.  FUNCTIONAL HISTORY:  Prior to admission, independent with a cane. Functional status upon admission to rehab services was minimal assist ambulate 120 feet with handheld assistance.  Min assist for stand pivot transfers, min-to-mod assist activities of daily living.  PHYSICAL EXAMINATION:  VITAL SIGNS:  Blood pressure 163/69, pulse 70, temperature 98, respirations 18. GENERAL:   This was an alert female, oriented x3.  Speech is a bit dysarthric, but fully intelligible.  She followed commands.  She had a right facial droop and tongue deviation.  Visual acuity was poor. LUNGS:  Clear to auscultation. CARDIAC:  Regular rate and rhythm. ABDOMEN:  Soft, nontender.  Good bowel sounds.  [place] COURSE:  The patient was admitted to inpatient rehab services with therapies initiated on a 3-hour daily basis consisting of physical therapy, occupational therapy, speech therapy and rehabilitation nursing.  The following issues were addressed during the patient's rehabilitation stay.  Pertaining to Amanda Cochran functional deficits secondary to left basal ganglia infarct remained stable.  She would follow up Neurology Services.  She was on Aggrenox therapy as well as a low-dose aspirin which was weaned off after 2 weeks.  Her diet was a mechanical soft nectar thick liquids followed by speech therapy.  No signs of aspiration.  She would follow up with speech therapy as an ongoing after discharge.  Blood pressures controlled on Lotensin as well as hydralazine with no orthostatic changes.  She would follow up with her primary MD.  Subcutaneous heparin for DVT prophylaxis.  No bleeding episodes.  She did have a history of glaucoma and very limited vision. She remained on hormone supplement for hypothyroidism as well as close monitoring of all electrolytes.  The patient received weekly  collaborative interdisciplinary team conferences to discuss estimated length of stay, family teaching, and any barriers to her discharge. Sessions focused on activities of daily living.  She was ambulating with a rolling walker, needing some cuing for safety, also noted limited vision.  She was able to successfully complete all full preparation task with supervision for activities of daily living.  She needed some encouragement to use her rolling walker.  Grooming tasks  completed standing at modified independent level.  Full family teaching was completed and plan was discharge to home with ongoing home therapies as dictated per rehab services.  DISCHARGE MEDICATIONS:  Norvasc 10 mg p.o. daily,  daily, Lipitor 20 mg p.o. daily, Lotensin 40 mg p.o. daily, Aggrenox one capsule p.o.twice daily, Cosopt ophthalmic solution one drop both eyes twice daily, hydralazine 25 mg p.o. t.i.d., Xalatan ophthalmic solution 0.05% one drop both eyes at bedtime, Synthroid 100 mcg p.o. daily, Naphcon ophthalmic solution 0.3% two drops both eyes four times daily as needed.  DIET:  Mechanical soft nectar thick liquids.  Followup should be with Dr. Lynford Citizen at the outpatient rehab center as directed; Dr. Antony Contras, Neurology Services, one month, call for appointment; Dr. Crist Infante, medical management.     Lauraine Rinne, P.A.   ______________________________ Charlett Blake, M.D.    DA/MEDQ  D:  02/19/2014  T:  02/19/2014  Job:  [id number]  cc:   Pramod P. Leonie Man, MD Jeannette How Perini, M.D.

## 2014-02-19 NOTE — Progress Notes (Signed)
C/O vision much worse since having stroke, wears glasses. Left hearing aid in place, still HOH. Excited about going home today. Patrici Ranks A

## 2014-02-19 NOTE — Progress Notes (Signed)
80 y.o. right handed female with history of hypertension and previous strokes with little residual weakness maintained on aspirin 325 mg daily and severe glaucoma with limited vision. Lives alone and uses a cane. He presented 02/03/2014 with slurred speech and right facial droop. MRI showed acute left basal ganglia infarct as well as moderate chronic small vessel ischemic disease. MRA with no evidence of major intracranial occlusion or stenosis. Patient did not receive TPA. Echocardiogram with ejection fraction of 40% without embolism. Carotid Doppler no ICA stenosis. Neurology services consulted presently on aspirin 81 mg daily as well as Aggrenox. Dysphagia 3 nectar thick liquid diet  Subjective/Complaints: Up with therapy EOB. No new issues today Review of Systems - Negative except as above  Objective: Vital Signs: Blood pressure 151/72, pulse 72, temperature 97.4 F (36.3 C), temperature source Oral, resp. rate 18, height 5' 6"  (1.676 m), weight 69.9 kg (154 lb 1.6 oz), SpO2 97 %. No results found. No results found for this or any previous visit (from the past 72 hour(s)).   HEENT: normal Cardio: irregular and no murmur or gallop Resp: CTA B/L and unlabored GI: BS positive and NT, ND Extremity:  Pulses positive and No Edema Skin:   Intact Neuro: Alert/Oriented, tired , Musc/Skel:  Normal, FROM right hip and knee Gen NAD   Assessment/Plan: 1. Functional deficits secondary to Left basal ganglia infarct which require 3+ hours per day of interdisciplinary therapy in a comprehensive inpatient rehab setting. Physiatrist is providing close team supervision and 24 hour management of active medical problems listed below. Physiatrist and rehab team continue to assess barriers to discharge/monitor patient progress toward functional and medical goals. Team conference today please see physician documentation under team conference tab, met with team face-to-face to discuss problems,progress, and  goals. Formulized individual treatment plan based on medical history, underlying problem and comorbidities. FIM: FIM - Bathing Bathing Steps Patient Completed: Chest, Right Arm, Left Arm, Abdomen, Front perineal area, Buttocks, Right upper leg, Left upper leg, Right lower leg (including foot), Left lower leg (including foot) Bathing: 6: Assistive device (Comment)  FIM - Upper Body Dressing/Undressing Upper body dressing/undressing steps patient completed: Thread/unthread right bra strap, Thread/unthread left bra strap, Hook/unhook bra, Thread/unthread right sleeve of pullover shirt/dresss, Thread/unthread left sleeve of pullover shirt/dress, Put head through opening of pull over shirt/dress, Pull shirt over trunk Upper body dressing/undressing: 6: More than reasonable amount of time FIM - Lower Body Dressing/Undressing Lower body dressing/undressing steps patient completed: Don/Doff right sock, Don/Doff left sock, Don/Doff right shoe, Don/Doff left shoe, Fasten/unfasten right shoe, Fasten/unfasten left shoe, Thread/unthread right underwear leg, Thread/unthread left underwear leg, Pull underwear up/down, Thread/unthread right pants leg, Thread/unthread left pants leg, Pull pants up/down Lower body dressing/undressing: 6: More than reasonable amount of time  FIM - Toileting Toileting steps completed by patient: Adjust clothing prior to toileting, Performs perineal hygiene, Adjust clothing after toileting Toileting Assistive Devices: Grab bar or rail for support Toileting: 6: Assistive device: No helper  FIM - Radio producer Devices: Product manager Transfers: 5-To toilet/BSC: Supervision (verbal cues/safety issues), 5-From toilet/BSC: Supervision (verbal cues/safety issues)  FIM - Control and instrumentation engineer Devices: Environmental consultant, Arm rests Bed/Chair Transfer: 5: Bed > Chair or W/C: Supervision (verbal cues/safety issues), 5: Chair or W/C > Bed:  Supervision (verbal cues/safety issues), 5: Sit > Supine: Supervision (verbal cues/safety issues), 6: Supine > Sit: No assist  FIM - Locomotion: Wheelchair Locomotion: Wheelchair: 0: Activity did not occur FIM - Locomotion: Ambulation Locomotion:  Ambulation Assistive Devices: Walker - Rolling Ambulation/Gait Assistance: 5: Supervision Locomotion: Ambulation: 5: Travels 150 ft or more with supervision/safety issues  Comprehension Comprehension Mode: Auditory Comprehension: 5-Follows basic conversation/direction: With extra time/assistive device  Expression Expression Mode: Verbal Expression: 5-Expresses basic needs/ideas: With extra time/assistive device  Social Interaction Social Interaction: 6-Interacts appropriately with others with medication or extra time (anti-anxiety, antidepressant).  Problem Solving Problem Solving: 5-Solves basic 90% of the time/requires cueing < 10% of the time  Memory Memory: 5-Recognizes or recalls 90% of the time/requires cueing < 10% of the time   Medical Problem List and Plan: 1. Functional deficits secondary to left basal ganglia infarct secondary to small vessel disease  -dc today 2.  DVT Prophylaxis/Anticoagulation: Subcutaneous heparin. Monitor platelet counts and any signs of bleeding 3. Pain Management: Tylenol as needed, add voltaren gel R knee 4. Dysphagia. Dysphagia 3 and liquids. Monitor for any signs of aspiration. Follow-up speech therapy 5. Neuropsych: This patient is capable of making decisions on his own behalf. 6. Skin/Wound Care: Routine skin checks 7. Fluids/Electrolytes/Nutrition: Strict I and O's.   8. Hypertension. Lotensin 40 mg daily. Monitor with increased mobility, will adjust meds 9. Mood/depression. Lexapro 10 mg daily. Provide emotional support 10. Glaucoma. Continue eyedrops as advised , d/c trazodone given pt not sure what type of glaucoma she has 11. Hypothyroidism. Synthroid 12. Hyperlipidemia. Zocor  LOS (Days)  12 A FACE TO FACE EVALUATION WAS PERFORMED  , T 02/19/2014, 8:18 AM

## 2014-02-19 NOTE — Progress Notes (Signed)
Social Work Patient ID: Amanda Cochran, female   DOB: 13-Apr-1928, 80 y.o.   MRN: [id number] Met with pt who has found someone to stay with her at home.  Her nephew is coming to transport her home. They are aware the team's recommendation is 24 hr supervision. Equipment to be delivered this am.  Pt pleased with the plan and is glad to be going home.

## 2014-02-19 NOTE — Discharge Summary (Signed)
Discharge summary job [phone]

## 2014-02-19 NOTE — Progress Notes (Signed)
Social Work Discharge Note Discharge Note  The overall goal for the admission was met for:   Discharge location: Yes-HOME WITH HIRED ASSIST-ALTHOUGH UNSURE IF 24 HR  Length of Stay: Yes-12 DAYS  Discharge activity level: Yes-SUPERVISION LEVEL  Home/community participation: Yes  Services provided included: MD, RD, PT, OT, SLP, RN, CM, Pharmacy and SW  Financial Services: Medicare and Private Insurance: [place]  Follow-up services arranged: Home Health: [facility]-PT,OT,RN,SP, DME: [facility] and Patient/Family request agency HH: [facility] HAD WITH LATE HUSBAND, DME: [facility]  Comments (or additional information):THEY HAVE HIRED SOMEONE TO BE HOME WITH PT-TO [place] OF TEAM'S RECOMMENDATION OF 24 HR   Patient/Family verbalized understanding of follow-up arrangements: Yes  Individual responsible for coordination of the follow-up plan: SELF & CHRIS-NEPHEW  Confirmed correct DME delivered: [name] 02/19/2014    [name]

## 2014-02-19 NOTE — Progress Notes (Signed)
Physical Therapy Discharge Summary  Patient Details  Name: Amanda Cochran MRN: [id number] Date of Birth: 1929-02-03  Today's Date: 02/19/2014 PT Individual Time: 5638-9373 PT Individual Time Calculation (min): 60 min    Patient has met 10 of 10 long term goals due to improved activity tolerance, improved balance, improved postural control, increased strength, decreased pain and ability to compensate for deficits.  Patient to discharge at an ambulatory level Supervision.   Patient's care partner unavailable to provide the necessary physical and cognitive assistance at discharge.  Reasons goals not met: N/A; all goals met or surpassed.  Recommendation:  Patient will benefit from ongoing skilled PT services in home health setting to continue to advance safe functional mobility, address ongoing impairments in stability/independnece with functional mobility, facilitate pt utilization for compensatory strategies for visual impairments, and to minimize fall risk.  Equipment: rolling walker, R knee sleeve  Reasons for discharge: treatment goals met and discharge from hospital  Patient/family agrees with progress made and goals achieved: Yes   Skilled Therapeutic Interventions/Progress Updates Pt received seated in recliner; agreeable to therapy. Session focused on assessing/addressing stability and independence with functional mobility and dynamic standing balance. See below for discharge evaluation findings and Berg Balance Scale score (49/56). Pt educated on findings, goals, progress, and discharge recommendations. Pt verbalized understanding of rationale for recommendation for 24/7 supervision due to safety concerns secondary to visual impairments. Pt in verbally agreed with recommendation but stated, "I just don't have any one to be there with me and I want to go home." Pt verbalized understanding of all education. Session ended in pt room, where pt was left seated in recliner with all needs  within reach.  PT Discharge Precautions/Restrictions Precautions Precautions: Fall Required Braces or Orthoses: Other Brace/Splint Other Brace/Splint: R knee sleeve during ambulation Restrictions Weight Bearing Restrictions: No Vital Signs Therapy Vitals BP: (!) 146/75 mmHg Pain Pain Assessment Pain Assessment: No/denies pain Cognition Overall Cognitive Status: Within Functional Limits for tasks assessed Sensation Sensation Light Touch: Appears Intact Stereognosis: Not tested Hot/Cold: Not tested Proprioception: Appears Intact Coordination Gross Motor Movements are Fluid and Coordinated: Yes Fine Motor Movements are Fluid and Coordinated: No Finger Nose Finger Test: [facility] Heel Shin Test: Grossly WFL in bilat LE's. Motor  Motor Motor: Within Functional Limits  Mobility Bed Mobility Bed Mobility: Supine to Sit;Sit to Supine Supine to Sit: HOB flat;6: Modified independent (Device/Increase time) Sit to Supine: 6: Modified independent (Device/Increase time);HOB flat Transfers Transfers: Yes Sit to Stand: 6: Modified independent (Device/Increase time) Stand to Sit: 6: Modified independent (Device/Increase time) Stand Pivot Transfers: 6: Modified independent (Device/Increase time);Other (comment) (using rolling walker) Locomotion  Ambulation Ambulation: Yes Ambulation/Gait Assistance: 5: Supervision Ambulation Distance (Feet): 300 Feet Assistive device: Rolling walker Ambulation/Gait Assistance Details: Verbal cues for precautions/safety Ambulation/Gait Assistance Details: Verbal cueing for obstalce negotiation in more crowded areas secondary to pt visual impairments (present PTA). Gait Gait: Yes Gait Pattern: Impaired Gait Pattern: Step-through pattern;Narrow base of support;Decreased dorsiflexion - right Gait velocity: Self-selected gait speed = .77 m/s. Stairs / Additional Locomotion Stairs: Yes Stairs Assistance: 5: Supervision Stair Management Technique:  Alternating pattern;One rail Right;Forwards Number of Stairs: 12 Height of Stairs: 7.5 Ramp: 5: Supervision;Other (comment) (using rolling walker) Curb: 5: Supervision;Other (comment) (using rolling walker)  Trunk/Postural Assessment  Cervical Assessment Cervical Assessment: Within Functional Limits Thoracic Assessment Thoracic Assessment: Within Functional Limits Lumbar Assessment Lumbar Assessment: Within Functional Limits Postural Control Postural Control: Deficits on evaluation Righting Reactions: Pt with effective hip, ankle, and stepping  strategies with balance perturbations in all directions with exception of delayed ankle strategy with LOB anterior and to L side.  Balance Balance Balance Assessed: Yes Standardized Balance Assessment Standardized Balance Assessment: Berg Balance Test Berg Balance Test Sit to Stand: Able to stand without using hands and stabilize independently Standing Unsupported: Able to stand safely 2 minutes Sitting with Back Unsupported but Feet Supported on Floor or Stool: Able to sit safely and securely 2 minutes Stand to Sit: Sits safely with minimal use of hands Transfers: Able to transfer safely, minor use of hands Standing Unsupported with Eyes Closed: Able to stand 10 seconds safely Standing Ubsupported with Feet Together: Able to place feet together independently and stand 1 minute safely From Standing, Reach Forward with Outstretched Arm: Can reach confidently >25 cm (10") From Standing Position, Pick up Object from Floor: Able to pick up shoe, needs supervision From Standing Position, Turn to Look Behind Over each Shoulder: Looks behind from both sides and weight shifts well Turn 360 Degrees: Able to turn 360 degrees safely one side only in 4 seconds or less Standing Unsupported, Alternately Place Feet on Step/Stool: Able to complete 4 steps without aid or supervision Standing Unsupported, One Foot in Front: Able to take small step independently  and hold 30 seconds (R semi tandem x230 seconds with LOB to R side with effective self-recovery) Standing on One Leg: Able to lift leg independently and hold 5-10 seconds Total Score: 49 Dynamic Standing Balance Dynamic Standing - Balance Support: No upper extremity supported;During functional activity Dynamic Standing - Level of Assistance: 6: Modified independent (Device/Increase time) Dynamic Standing - Comments: See Berg Balance Scale above,. Extremity Assessment  RLE Assessment RLE Assessment: Within Functional Limits LLE Assessment LLE Assessment: Within Functional Limits  See FIM for current functional status  , Malva Cogan 02/19/2014, 6:45 PM

## 2014-02-19 NOTE — Progress Notes (Signed)
Pt discharged home with nephew. Discharge instructions provided by Silvestre Mesi, PA. All questions answered. Pt verbalized understanding. Pt escorted off unit in w/c with personal belonging by [place], [place].

## 2014-02-20 NOTE — Progress Notes (Signed)
Occupational Therapy Discharge Summary  Patient Details  Name: Amanda Cochran MRN: [id number] Date of Birth: 02-Apr-1928  Patient has met 9 of 9 long term goals due to improved activity tolerance, improved balance, ability to compensate for deficits, functional use of  RIGHT upper extremity, improved awareness and improved coordination.  Patient to discharge at overall Modified Independent level, recommending supervision for transfers and functional mobility.  Patient's care partner unavailable to provide the necessary physical and cognitive assistance at discharge.    Reasons goals not met: N/A   Recommendation:  Patient will benefit from ongoing skilled OT services in home health setting to continue to advance functional skills in the area of BADL, iADL and Reduce care partner burden.  Equipment: tub transfer bench  Reasons for discharge: treatment goals met and discharge from hospital  Patient/family agrees with progress made and goals achieved: Yes  OT Discharge Precautions Precautions: Fall Required Braces or Orthoses: Other Brace/Splint Other Brace/Splint: R knee sleeve during ambulation Restrictions Weight Bearing Restrictions: No ADL See FIM Vision Baseline Vision/History: Wears glasses Patient Visual Report: Central vision impairment;Blurring of vision (low vision present PTA) Cognition Overall Cognitive Status: Within Functional Limits for tasks assessed Sensation Sensation Light Touch: Appears Intact Stereognosis: Not tested Hot/Cold: Not tested Proprioception: Appears Intact Coordination Gross Motor Movements are Fluid and Coordinated: Yes Fine Motor Movements are Fluid and Coordinated: No Finger Nose Finger Test: [facility] Heel Shin Test: Grossly WFL in bilat LE's. Extremity Assessment  RUE Assessment RUE Assessment: Within Functional Limits LUE Assessment LUE Assessment: Within Functional Limits See FIM for current functional status  , [facility] 02/20/2014,  3:58 PM

## 2014-02-22 DIAGNOSIS — I739 Peripheral vascular disease, unspecified: Secondary | ICD-10-CM | POA: Diagnosis not present

## 2014-02-22 DIAGNOSIS — I69391 Dysphagia following cerebral infarction: Secondary | ICD-10-CM | POA: Diagnosis not present

## 2014-02-22 DIAGNOSIS — E785 Hyperlipidemia, unspecified: Secondary | ICD-10-CM | POA: Diagnosis not present

## 2014-02-22 DIAGNOSIS — H548 Legal blindness, as defined in USA: Secondary | ICD-10-CM | POA: Diagnosis not present

## 2014-02-22 DIAGNOSIS — F329 Major depressive disorder, single episode, unspecified: Secondary | ICD-10-CM | POA: Diagnosis not present

## 2014-02-22 DIAGNOSIS — I10 Essential (primary) hypertension: Secondary | ICD-10-CM | POA: Diagnosis not present

## 2014-02-22 DIAGNOSIS — Z8673 Personal history of transient ischemic attack (TIA), and cerebral infarction without residual deficits: Secondary | ICD-10-CM | POA: Diagnosis not present

## 2014-02-22 DIAGNOSIS — I69351 Hemiplegia and hemiparesis following cerebral infarction affecting right dominant side: Secondary | ICD-10-CM | POA: Diagnosis not present

## 2014-02-22 DIAGNOSIS — R1310 Dysphagia, unspecified: Secondary | ICD-10-CM | POA: Diagnosis not present

## 2014-02-22 DIAGNOSIS — E039 Hypothyroidism, unspecified: Secondary | ICD-10-CM | POA: Diagnosis not present

## 2014-02-24 DIAGNOSIS — I69391 Dysphagia following cerebral infarction: Secondary | ICD-10-CM | POA: Diagnosis not present

## 2014-02-24 DIAGNOSIS — I69351 Hemiplegia and hemiparesis following cerebral infarction affecting right dominant side: Secondary | ICD-10-CM | POA: Diagnosis not present

## 2014-02-24 DIAGNOSIS — I739 Peripheral vascular disease, unspecified: Secondary | ICD-10-CM | POA: Diagnosis not present

## 2014-02-24 DIAGNOSIS — I10 Essential (primary) hypertension: Secondary | ICD-10-CM | POA: Diagnosis not present

## 2014-02-24 DIAGNOSIS — R1310 Dysphagia, unspecified: Secondary | ICD-10-CM | POA: Diagnosis not present

## 2014-02-24 DIAGNOSIS — H548 Legal blindness, as defined in USA: Secondary | ICD-10-CM | POA: Diagnosis not present

## 2014-02-27 DIAGNOSIS — I69351 Hemiplegia and hemiparesis following cerebral infarction affecting right dominant side: Secondary | ICD-10-CM | POA: Diagnosis not present

## 2014-02-27 DIAGNOSIS — I69391 Dysphagia following cerebral infarction: Secondary | ICD-10-CM | POA: Diagnosis not present

## 2014-02-27 DIAGNOSIS — R1310 Dysphagia, unspecified: Secondary | ICD-10-CM | POA: Diagnosis not present

## 2014-02-27 DIAGNOSIS — I739 Peripheral vascular disease, unspecified: Secondary | ICD-10-CM | POA: Diagnosis not present

## 2014-02-27 DIAGNOSIS — I10 Essential (primary) hypertension: Secondary | ICD-10-CM | POA: Diagnosis not present

## 2014-02-27 DIAGNOSIS — H548 Legal blindness, as defined in USA: Secondary | ICD-10-CM | POA: Diagnosis not present

## 2014-02-28 DIAGNOSIS — I69351 Hemiplegia and hemiparesis following cerebral infarction affecting right dominant side: Secondary | ICD-10-CM | POA: Diagnosis not present

## 2014-02-28 DIAGNOSIS — H548 Legal blindness, as defined in USA: Secondary | ICD-10-CM | POA: Diagnosis not present

## 2014-02-28 DIAGNOSIS — I739 Peripheral vascular disease, unspecified: Secondary | ICD-10-CM | POA: Diagnosis not present

## 2014-02-28 DIAGNOSIS — I69391 Dysphagia following cerebral infarction: Secondary | ICD-10-CM | POA: Diagnosis not present

## 2014-02-28 DIAGNOSIS — R1310 Dysphagia, unspecified: Secondary | ICD-10-CM | POA: Diagnosis not present

## 2014-02-28 DIAGNOSIS — I10 Essential (primary) hypertension: Secondary | ICD-10-CM | POA: Diagnosis not present

## 2014-03-01 DIAGNOSIS — I69391 Dysphagia following cerebral infarction: Secondary | ICD-10-CM | POA: Diagnosis not present

## 2014-03-01 DIAGNOSIS — H548 Legal blindness, as defined in USA: Secondary | ICD-10-CM | POA: Diagnosis not present

## 2014-03-01 DIAGNOSIS — I69351 Hemiplegia and hemiparesis following cerebral infarction affecting right dominant side: Secondary | ICD-10-CM | POA: Diagnosis not present

## 2014-03-01 DIAGNOSIS — I10 Essential (primary) hypertension: Secondary | ICD-10-CM | POA: Diagnosis not present

## 2014-03-01 DIAGNOSIS — I739 Peripheral vascular disease, unspecified: Secondary | ICD-10-CM | POA: Diagnosis not present

## 2014-03-01 DIAGNOSIS — R1310 Dysphagia, unspecified: Secondary | ICD-10-CM | POA: Diagnosis not present

## 2014-03-02 DIAGNOSIS — I739 Peripheral vascular disease, unspecified: Secondary | ICD-10-CM | POA: Diagnosis not present

## 2014-03-02 DIAGNOSIS — I69391 Dysphagia following cerebral infarction: Secondary | ICD-10-CM | POA: Diagnosis not present

## 2014-03-02 DIAGNOSIS — H548 Legal blindness, as defined in USA: Secondary | ICD-10-CM | POA: Diagnosis not present

## 2014-03-02 DIAGNOSIS — I10 Essential (primary) hypertension: Secondary | ICD-10-CM | POA: Diagnosis not present

## 2014-03-02 DIAGNOSIS — I69351 Hemiplegia and hemiparesis following cerebral infarction affecting right dominant side: Secondary | ICD-10-CM | POA: Diagnosis not present

## 2014-03-02 DIAGNOSIS — R1310 Dysphagia, unspecified: Secondary | ICD-10-CM | POA: Diagnosis not present

## 2014-03-05 DIAGNOSIS — H4011X3 Primary open-angle glaucoma, severe stage: Secondary | ICD-10-CM | POA: Diagnosis not present

## 2014-03-07 DIAGNOSIS — I69391 Dysphagia following cerebral infarction: Secondary | ICD-10-CM | POA: Diagnosis not present

## 2014-03-07 DIAGNOSIS — I10 Essential (primary) hypertension: Secondary | ICD-10-CM | POA: Diagnosis not present

## 2014-03-07 DIAGNOSIS — R1310 Dysphagia, unspecified: Secondary | ICD-10-CM | POA: Diagnosis not present

## 2014-03-07 DIAGNOSIS — H548 Legal blindness, as defined in USA: Secondary | ICD-10-CM | POA: Diagnosis not present

## 2014-03-07 DIAGNOSIS — I739 Peripheral vascular disease, unspecified: Secondary | ICD-10-CM | POA: Diagnosis not present

## 2014-03-07 DIAGNOSIS — I69351 Hemiplegia and hemiparesis following cerebral infarction affecting right dominant side: Secondary | ICD-10-CM | POA: Diagnosis not present

## 2014-03-08 DIAGNOSIS — R1310 Dysphagia, unspecified: Secondary | ICD-10-CM | POA: Diagnosis not present

## 2014-03-08 DIAGNOSIS — I739 Peripheral vascular disease, unspecified: Secondary | ICD-10-CM | POA: Diagnosis not present

## 2014-03-08 DIAGNOSIS — I10 Essential (primary) hypertension: Secondary | ICD-10-CM | POA: Diagnosis not present

## 2014-03-08 DIAGNOSIS — I69351 Hemiplegia and hemiparesis following cerebral infarction affecting right dominant side: Secondary | ICD-10-CM | POA: Diagnosis not present

## 2014-03-08 DIAGNOSIS — I69391 Dysphagia following cerebral infarction: Secondary | ICD-10-CM | POA: Diagnosis not present

## 2014-03-08 DIAGNOSIS — H548 Legal blindness, as defined in USA: Secondary | ICD-10-CM | POA: Diagnosis not present

## 2014-03-09 DIAGNOSIS — I739 Peripheral vascular disease, unspecified: Secondary | ICD-10-CM | POA: Diagnosis not present

## 2014-03-09 DIAGNOSIS — I10 Essential (primary) hypertension: Secondary | ICD-10-CM | POA: Diagnosis not present

## 2014-03-09 DIAGNOSIS — H548 Legal blindness, as defined in USA: Secondary | ICD-10-CM | POA: Diagnosis not present

## 2014-03-09 DIAGNOSIS — I69351 Hemiplegia and hemiparesis following cerebral infarction affecting right dominant side: Secondary | ICD-10-CM | POA: Diagnosis not present

## 2014-03-09 DIAGNOSIS — I69391 Dysphagia following cerebral infarction: Secondary | ICD-10-CM | POA: Diagnosis not present

## 2014-03-09 DIAGNOSIS — R1310 Dysphagia, unspecified: Secondary | ICD-10-CM | POA: Diagnosis not present

## 2014-03-13 DIAGNOSIS — I69391 Dysphagia following cerebral infarction: Secondary | ICD-10-CM | POA: Diagnosis not present

## 2014-03-13 DIAGNOSIS — I69351 Hemiplegia and hemiparesis following cerebral infarction affecting right dominant side: Secondary | ICD-10-CM | POA: Diagnosis not present

## 2014-03-13 DIAGNOSIS — I739 Peripheral vascular disease, unspecified: Secondary | ICD-10-CM | POA: Diagnosis not present

## 2014-03-13 DIAGNOSIS — F329 Major depressive disorder, single episode, unspecified: Secondary | ICD-10-CM

## 2014-03-13 DIAGNOSIS — E039 Hypothyroidism, unspecified: Secondary | ICD-10-CM

## 2014-03-13 DIAGNOSIS — I10 Essential (primary) hypertension: Secondary | ICD-10-CM | POA: Diagnosis not present

## 2014-03-13 DIAGNOSIS — E785 Hyperlipidemia, unspecified: Secondary | ICD-10-CM

## 2014-03-13 DIAGNOSIS — H548 Legal blindness, as defined in USA: Secondary | ICD-10-CM

## 2014-03-13 DIAGNOSIS — R1310 Dysphagia, unspecified: Secondary | ICD-10-CM | POA: Diagnosis not present

## 2014-03-13 DIAGNOSIS — Z8673 Personal history of transient ischemic attack (TIA), and cerebral infarction without residual deficits: Secondary | ICD-10-CM

## 2014-03-14 DIAGNOSIS — I10 Essential (primary) hypertension: Secondary | ICD-10-CM | POA: Diagnosis not present

## 2014-03-14 DIAGNOSIS — I69351 Hemiplegia and hemiparesis following cerebral infarction affecting right dominant side: Secondary | ICD-10-CM | POA: Diagnosis not present

## 2014-03-14 DIAGNOSIS — I69391 Dysphagia following cerebral infarction: Secondary | ICD-10-CM | POA: Diagnosis not present

## 2014-03-14 DIAGNOSIS — R1310 Dysphagia, unspecified: Secondary | ICD-10-CM | POA: Diagnosis not present

## 2014-03-14 DIAGNOSIS — I739 Peripheral vascular disease, unspecified: Secondary | ICD-10-CM | POA: Diagnosis not present

## 2014-03-14 DIAGNOSIS — H548 Legal blindness, as defined in USA: Secondary | ICD-10-CM | POA: Diagnosis not present

## 2014-03-15 DIAGNOSIS — I739 Peripheral vascular disease, unspecified: Secondary | ICD-10-CM | POA: Diagnosis not present

## 2014-03-15 DIAGNOSIS — R1310 Dysphagia, unspecified: Secondary | ICD-10-CM | POA: Diagnosis not present

## 2014-03-15 DIAGNOSIS — I10 Essential (primary) hypertension: Secondary | ICD-10-CM | POA: Diagnosis not present

## 2014-03-15 DIAGNOSIS — H548 Legal blindness, as defined in USA: Secondary | ICD-10-CM | POA: Diagnosis not present

## 2014-03-15 DIAGNOSIS — I69391 Dysphagia following cerebral infarction: Secondary | ICD-10-CM | POA: Diagnosis not present

## 2014-03-15 DIAGNOSIS — I69351 Hemiplegia and hemiparesis following cerebral infarction affecting right dominant side: Secondary | ICD-10-CM | POA: Diagnosis not present

## 2014-03-19 ENCOUNTER — Inpatient Hospital Stay: Payer: Medicare Other | Admitting: Physical Medicine & Rehabilitation

## 2014-03-19 ENCOUNTER — Encounter: Payer: Medicare Other | Attending: Physical Medicine & Rehabilitation

## 2014-03-21 DIAGNOSIS — Z6825 Body mass index (BMI) 25.0-25.9, adult: Secondary | ICD-10-CM | POA: Diagnosis not present

## 2014-03-21 DIAGNOSIS — E785 Hyperlipidemia, unspecified: Secondary | ICD-10-CM | POA: Diagnosis not present

## 2014-03-21 DIAGNOSIS — I10 Essential (primary) hypertension: Secondary | ICD-10-CM | POA: Diagnosis not present

## 2014-03-21 DIAGNOSIS — E039 Hypothyroidism, unspecified: Secondary | ICD-10-CM | POA: Diagnosis not present

## 2014-03-21 DIAGNOSIS — I638 Other cerebral infarction: Secondary | ICD-10-CM | POA: Diagnosis not present

## 2014-03-21 DIAGNOSIS — I69959 Hemiplegia and hemiparesis following unspecified cerebrovascular disease affecting unspecified side: Secondary | ICD-10-CM | POA: Diagnosis not present

## 2014-03-29 DIAGNOSIS — H401233 Low-tension glaucoma, bilateral, severe stage: Secondary | ICD-10-CM | POA: Diagnosis not present

## 2014-03-30 DIAGNOSIS — I739 Peripheral vascular disease, unspecified: Secondary | ICD-10-CM | POA: Diagnosis not present

## 2014-03-30 DIAGNOSIS — I69391 Dysphagia following cerebral infarction: Secondary | ICD-10-CM | POA: Diagnosis not present

## 2014-03-30 DIAGNOSIS — I69351 Hemiplegia and hemiparesis following cerebral infarction affecting right dominant side: Secondary | ICD-10-CM | POA: Diagnosis not present

## 2014-03-30 DIAGNOSIS — I10 Essential (primary) hypertension: Secondary | ICD-10-CM | POA: Diagnosis not present

## 2014-03-30 DIAGNOSIS — H548 Legal blindness, as defined in USA: Secondary | ICD-10-CM | POA: Diagnosis not present

## 2014-03-30 DIAGNOSIS — R1310 Dysphagia, unspecified: Secondary | ICD-10-CM | POA: Diagnosis not present

## 2014-04-06 ENCOUNTER — Ambulatory Visit (HOSPITAL_BASED_OUTPATIENT_CLINIC_OR_DEPARTMENT_OTHER): Payer: Medicare Other | Admitting: Physical Medicine & Rehabilitation

## 2014-04-06 ENCOUNTER — Encounter: Payer: Self-pay | Admitting: Physical Medicine & Rehabilitation

## 2014-04-06 ENCOUNTER — Encounter: Payer: Medicare Other | Attending: Physical Medicine & Rehabilitation

## 2014-04-06 VITALS — BP 161/67 | HR 70 | Resp 14

## 2014-04-06 DIAGNOSIS — G8191 Hemiplegia, unspecified affecting right dominant side: Secondary | ICD-10-CM

## 2014-04-06 DIAGNOSIS — G8190 Hemiplegia, unspecified affecting unspecified side: Secondary | ICD-10-CM | POA: Diagnosis not present

## 2014-04-06 DIAGNOSIS — I639 Cerebral infarction, unspecified: Secondary | ICD-10-CM

## 2014-04-06 DIAGNOSIS — I6381 Other cerebral infarction due to occlusion or stenosis of small artery: Secondary | ICD-10-CM

## 2014-04-06 NOTE — Progress Notes (Signed)
Subjective:    Patient ID: Amanda Cochran, female    DOB: 15-May-1928, 81 y.o.   MRN: [id number]  HPI 81 year old right-handed female with history of hypertension, previous CVA maintained on aspirin therapy who lives alone and used a cane prior to admission.  Presented February 03, 2014, with slurred speech and right facial droop.  MRI of the head showed acute left basal ganglia infarct as well as moderate chronic small vessel ischemic changes.  MRA with no evidence of major intracranial occlusion or stenosis.  The patient did not receive tPA. Echocardiogram with ejection fraction of 40% without embolism.  Carotid Doppler's with no ICA stenosis.  Neurology Service was consulted, placed on aspirin as well as a Aggrenox for CVA prophylaxis.  Dysphagia three nectar thick liquids  HHOT finished, mod I ADL tub bench HHPT  Finished last week, doing HEP.  Still using cane for low vision from [place] feels some weakness from stroke, walking distance is still reduced  Walked twice around living room when she first came home from hospital, now can do 15 times  Limited more by vision than by weakness following stroke    Pain Inventory Average Pain 0 Pain Right Now 0 My pain is no pain  In the last 24 hours, has pain interfered with the following? General activity 0 Relation with others 0 Enjoyment of life 0 What TIME of day is your pain at its worst? no pain Sleep (in general) Good  Pain is worse with: no pain Pain improves with: no pain Relief from Meds: no pain  Mobility walk with assistance use a cane how many minutes can you walk? 10 ability to climb steps?  yes do you drive?  no  Function retired  Neuro/Psych No problems in this area  Prior Studies Any changes since last visit?  no  Physicians involved in your care Any changes since last visit?  no   History reviewed. No pertinent family history. History   Social History  . Marital Status: Married      Spouse Name: N/A  . Number of Children: N/A  . Years of Education: N/A   Social History Main Topics  . Smoking status: Never Smoker   . Smokeless tobacco: Not on file  . Alcohol Use: No  . Drug Use: No  . Sexual Activity: Not on file   Other Topics Concern  . None   Social History Narrative   Past Surgical History  Procedure Laterality Date  . Thyroid surgery  1976  . Tonsillectomy and adenoidectomy    . Vaginal prolapse repair     Past Medical History  Diagnosis Date  . Thyroid disease   . Hyperlipidemia   . Hypertension   . Glaucoma   . Arthritis   . Bladder infection    BP 161/67 mmHg  Pulse 70  Resp 14  SpO2 97%  Opioid Risk Score:   Fall Risk Score:    Review of Systems  Constitutional:       Poor appetite  Gastrointestinal: Positive for constipation.  All other systems reviewed and are negative.      Objective:   Physical Exam  Constitutional: She appears well-developed and well-nourished.  Nursing note and vitals reviewed.  Right side 4-/5  Left side 5/5  Sensation is intact Mood/affect are appropriate Ext without edema Right leg without tenderness to palp, no masses        Assessment & Plan:  Left basal ganglia infarct with mild residual  right hemiparesis mainly affecting the right upper extremity with decreased fine motor. Functionally she is modified independent. She states her overall function is more limited by her visual impairment which is related to her glaucoma.  I do not think she needs to go to outpatient therapy, functioning at a modified  Indep level within her visual impairments. She will follow-up with her primary care physician, explained that further medication refills will be through PCP Follow-up with neurology in April for secondary stroke prevention Follow-up with ophthalmology for glaucoma

## 2014-04-06 NOTE — Patient Instructions (Signed)
Continue with your home exercise program

## 2014-04-20 DIAGNOSIS — I739 Peripheral vascular disease, unspecified: Secondary | ICD-10-CM | POA: Diagnosis not present

## 2014-04-20 DIAGNOSIS — I69351 Hemiplegia and hemiparesis following cerebral infarction affecting right dominant side: Secondary | ICD-10-CM | POA: Diagnosis not present

## 2014-04-20 DIAGNOSIS — R1310 Dysphagia, unspecified: Secondary | ICD-10-CM | POA: Diagnosis not present

## 2014-04-20 DIAGNOSIS — I69391 Dysphagia following cerebral infarction: Secondary | ICD-10-CM | POA: Diagnosis not present

## 2014-04-20 DIAGNOSIS — H548 Legal blindness, as defined in USA: Secondary | ICD-10-CM | POA: Diagnosis not present

## 2014-04-20 DIAGNOSIS — I10 Essential (primary) hypertension: Secondary | ICD-10-CM | POA: Diagnosis not present

## 2014-04-23 DIAGNOSIS — I739 Peripheral vascular disease, unspecified: Secondary | ICD-10-CM | POA: Diagnosis not present

## 2014-04-23 DIAGNOSIS — H548 Legal blindness, as defined in USA: Secondary | ICD-10-CM | POA: Diagnosis not present

## 2014-04-23 DIAGNOSIS — I69391 Dysphagia following cerebral infarction: Secondary | ICD-10-CM | POA: Diagnosis not present

## 2014-04-23 DIAGNOSIS — E785 Hyperlipidemia, unspecified: Secondary | ICD-10-CM | POA: Diagnosis not present

## 2014-04-23 DIAGNOSIS — F329 Major depressive disorder, single episode, unspecified: Secondary | ICD-10-CM | POA: Diagnosis not present

## 2014-04-23 DIAGNOSIS — N390 Urinary tract infection, site not specified: Secondary | ICD-10-CM | POA: Diagnosis not present

## 2014-04-23 DIAGNOSIS — Z008 Encounter for other general examination: Secondary | ICD-10-CM | POA: Diagnosis not present

## 2014-04-23 DIAGNOSIS — Z8673 Personal history of transient ischemic attack (TIA), and cerebral infarction without residual deficits: Secondary | ICD-10-CM | POA: Diagnosis not present

## 2014-04-23 DIAGNOSIS — M859 Disorder of bone density and structure, unspecified: Secondary | ICD-10-CM | POA: Diagnosis not present

## 2014-04-23 DIAGNOSIS — I10 Essential (primary) hypertension: Secondary | ICD-10-CM | POA: Diagnosis not present

## 2014-04-23 DIAGNOSIS — E039 Hypothyroidism, unspecified: Secondary | ICD-10-CM | POA: Diagnosis not present

## 2014-04-23 DIAGNOSIS — R1310 Dysphagia, unspecified: Secondary | ICD-10-CM | POA: Diagnosis not present

## 2014-04-23 DIAGNOSIS — R7301 Impaired fasting glucose: Secondary | ICD-10-CM | POA: Diagnosis not present

## 2014-04-23 DIAGNOSIS — I69351 Hemiplegia and hemiparesis following cerebral infarction affecting right dominant side: Secondary | ICD-10-CM | POA: Diagnosis not present

## 2014-04-28 DIAGNOSIS — I69391 Dysphagia following cerebral infarction: Secondary | ICD-10-CM | POA: Diagnosis not present

## 2014-04-28 DIAGNOSIS — R1310 Dysphagia, unspecified: Secondary | ICD-10-CM | POA: Diagnosis not present

## 2014-04-28 DIAGNOSIS — H548 Legal blindness, as defined in USA: Secondary | ICD-10-CM | POA: Diagnosis not present

## 2014-04-28 DIAGNOSIS — I10 Essential (primary) hypertension: Secondary | ICD-10-CM | POA: Diagnosis not present

## 2014-04-28 DIAGNOSIS — I69351 Hemiplegia and hemiparesis following cerebral infarction affecting right dominant side: Secondary | ICD-10-CM | POA: Diagnosis not present

## 2014-04-28 DIAGNOSIS — I739 Peripheral vascular disease, unspecified: Secondary | ICD-10-CM | POA: Diagnosis not present

## 2014-04-30 ENCOUNTER — Other Ambulatory Visit: Payer: Self-pay | Admitting: Internal Medicine

## 2014-04-30 DIAGNOSIS — I493 Ventricular premature depolarization: Secondary | ICD-10-CM | POA: Diagnosis not present

## 2014-04-30 DIAGNOSIS — E785 Hyperlipidemia, unspecified: Secondary | ICD-10-CM | POA: Diagnosis not present

## 2014-04-30 DIAGNOSIS — M1990 Unspecified osteoarthritis, unspecified site: Secondary | ICD-10-CM | POA: Diagnosis not present

## 2014-04-30 DIAGNOSIS — M5380 Other specified dorsopathies, site unspecified: Secondary | ICD-10-CM | POA: Diagnosis not present

## 2014-04-30 DIAGNOSIS — M859 Disorder of bone density and structure, unspecified: Secondary | ICD-10-CM | POA: Diagnosis not present

## 2014-04-30 DIAGNOSIS — E559 Vitamin D deficiency, unspecified: Secondary | ICD-10-CM | POA: Diagnosis not present

## 2014-04-30 DIAGNOSIS — N644 Mastodynia: Secondary | ICD-10-CM

## 2014-04-30 DIAGNOSIS — I638 Other cerebral infarction: Secondary | ICD-10-CM | POA: Diagnosis not present

## 2014-04-30 DIAGNOSIS — G459 Transient cerebral ischemic attack, unspecified: Secondary | ICD-10-CM | POA: Diagnosis not present

## 2014-04-30 DIAGNOSIS — I69959 Hemiplegia and hemiparesis following unspecified cerebrovascular disease affecting unspecified side: Secondary | ICD-10-CM | POA: Diagnosis not present

## 2014-04-30 DIAGNOSIS — Z6824 Body mass index (BMI) 24.0-24.9, adult: Secondary | ICD-10-CM | POA: Diagnosis not present

## 2014-04-30 DIAGNOSIS — R809 Proteinuria, unspecified: Secondary | ICD-10-CM | POA: Diagnosis not present

## 2014-04-30 DIAGNOSIS — Z0000 Encounter for general adult medical examination without abnormal findings: Secondary | ICD-10-CM | POA: Diagnosis not present

## 2014-04-30 DIAGNOSIS — Z85828 Personal history of other malignant neoplasm of skin: Secondary | ICD-10-CM | POA: Diagnosis not present

## 2014-05-01 DIAGNOSIS — Z1212 Encounter for screening for malignant neoplasm of rectum: Secondary | ICD-10-CM | POA: Diagnosis not present

## 2014-05-03 DIAGNOSIS — I69391 Dysphagia following cerebral infarction: Secondary | ICD-10-CM | POA: Diagnosis not present

## 2014-05-03 DIAGNOSIS — I69351 Hemiplegia and hemiparesis following cerebral infarction affecting right dominant side: Secondary | ICD-10-CM | POA: Diagnosis not present

## 2014-05-03 DIAGNOSIS — R1310 Dysphagia, unspecified: Secondary | ICD-10-CM | POA: Diagnosis not present

## 2014-05-03 DIAGNOSIS — I10 Essential (primary) hypertension: Secondary | ICD-10-CM | POA: Diagnosis not present

## 2014-05-03 DIAGNOSIS — I739 Peripheral vascular disease, unspecified: Secondary | ICD-10-CM | POA: Diagnosis not present

## 2014-05-03 DIAGNOSIS — H548 Legal blindness, as defined in USA: Secondary | ICD-10-CM | POA: Diagnosis not present

## 2014-05-07 ENCOUNTER — Other Ambulatory Visit: Payer: Medicare Other

## 2014-05-09 ENCOUNTER — Other Ambulatory Visit: Payer: Medicare Other

## 2014-05-15 ENCOUNTER — Telehealth: Payer: Self-pay

## 2014-05-15 NOTE — Telephone Encounter (Signed)
Patient did not want a sooner apt. Patient only want's to see Dr.Sethi.

## 2014-05-15 NOTE — Telephone Encounter (Signed)
Patient is requesting to see Dr. Leonie Man only . I tried to call patient to get her sooner with [facility] . Patient is scheduled with Dr.Sethi.

## 2014-05-18 DIAGNOSIS — H548 Legal blindness, as defined in USA: Secondary | ICD-10-CM | POA: Diagnosis not present

## 2014-05-18 DIAGNOSIS — I10 Essential (primary) hypertension: Secondary | ICD-10-CM | POA: Diagnosis not present

## 2014-05-18 DIAGNOSIS — I739 Peripheral vascular disease, unspecified: Secondary | ICD-10-CM | POA: Diagnosis not present

## 2014-05-18 DIAGNOSIS — I69391 Dysphagia following cerebral infarction: Secondary | ICD-10-CM | POA: Diagnosis not present

## 2014-05-18 DIAGNOSIS — I69351 Hemiplegia and hemiparesis following cerebral infarction affecting right dominant side: Secondary | ICD-10-CM | POA: Diagnosis not present

## 2014-05-18 DIAGNOSIS — R1310 Dysphagia, unspecified: Secondary | ICD-10-CM | POA: Diagnosis not present

## 2014-05-23 DIAGNOSIS — R1310 Dysphagia, unspecified: Secondary | ICD-10-CM | POA: Diagnosis not present

## 2014-05-23 DIAGNOSIS — I69391 Dysphagia following cerebral infarction: Secondary | ICD-10-CM | POA: Diagnosis not present

## 2014-05-23 DIAGNOSIS — I10 Essential (primary) hypertension: Secondary | ICD-10-CM | POA: Diagnosis not present

## 2014-05-23 DIAGNOSIS — I739 Peripheral vascular disease, unspecified: Secondary | ICD-10-CM | POA: Diagnosis not present

## 2014-05-23 DIAGNOSIS — H548 Legal blindness, as defined in USA: Secondary | ICD-10-CM | POA: Diagnosis not present

## 2014-05-23 DIAGNOSIS — I69351 Hemiplegia and hemiparesis following cerebral infarction affecting right dominant side: Secondary | ICD-10-CM | POA: Diagnosis not present

## 2014-06-19 ENCOUNTER — Ambulatory Visit (INDEPENDENT_AMBULATORY_CARE_PROVIDER_SITE_OTHER): Payer: Medicare Other | Admitting: Neurology

## 2014-06-19 ENCOUNTER — Encounter: Payer: Self-pay | Admitting: Neurology

## 2014-06-19 VITALS — BP 124/61 | HR 58 | Ht 67.0 in | Wt 147.0 lb

## 2014-06-19 DIAGNOSIS — I639 Cerebral infarction, unspecified: Secondary | ICD-10-CM

## 2014-06-19 DIAGNOSIS — I6381 Other cerebral infarction due to occlusion or stenosis of small artery: Secondary | ICD-10-CM

## 2014-06-19 NOTE — Patient Instructions (Addendum)
I had a long d/w patient and nephew about her recent stroke, risk for recurrent stroke/TIAs, personally independently reviewed imaging studies and stroke evaluation results and answered questions.Continue aggrenox  for secondary stroke prevention and maintain strict control of hypertension with blood pressure goal below 130/90, diabetes with hemoglobin A1c goal below 6.5% and lipids with LDL cholesterol goal below 100 mg/dL. I also advised the patient to eat a healthy diet with plenty of whole grains, cereals, fruits and vegetables, exercise regularly and maintain ideal body weight. I advised her to discuss possibly reducing her blood pressure medications with her primary physician Dr. Joylene Draft since she is complaining of tiredness which may be medication side effect Followup in the future with me in  6 months or call earlier if necessary. Stroke Prevention Some medical conditions and behaviors are associated with an increased chance of having a stroke. You may prevent a stroke by making healthy choices and managing medical conditions. HOW CAN I REDUCE MY RISK OF HAVING A STROKE?   Stay physically active. Get at least 30 minutes of activity on most or all days.  Do not smoke. It may also be helpful to avoid exposure to secondhand smoke.  Limit alcohol use. Moderate alcohol use is considered to be:  No more than 2 drinks per day for men.  No more than 1 drink per day for nonpregnant women.  Eat healthy foods. This involves:  Eating 5 or more servings of fruits and vegetables a day.  Making dietary changes that address high blood pressure (hypertension), high cholesterol, diabetes, or obesity.  Manage your cholesterol levels.  Making food choices that are high in fiber and low in saturated fat, trans fat, and cholesterol may control cholesterol levels.  Take any prescribed medicines to control cholesterol as directed by your health care provider.  Manage your diabetes.  Controlling your  carbohydrate and sugar intake is recommended to manage diabetes.  Take any prescribed medicines to control diabetes as directed by your health care provider.  Control your hypertension.  Making food choices that are low in salt (sodium), saturated fat, trans fat, and cholesterol is recommended to manage hypertension.  Take any prescribed medicines to control hypertension as directed by your health care provider.  Maintain a healthy weight.  Reducing calorie intake and making food choices that are low in sodium, saturated fat, trans fat, and cholesterol are recommended to manage weight.  Stop drug abuse.  Avoid taking birth control pills.  Talk to your health care provider about the risks of taking birth control pills if you are over 70 years old, smoke, get migraines, or have ever had a blood clot.  Get evaluated for sleep disorders (sleep apnea).  Talk to your health care provider about getting a sleep evaluation if you snore a lot or have excessive sleepiness.  Take medicines only as directed by your health care provider.  For some people, aspirin or blood thinners (anticoagulants) are helpful in reducing the risk of forming abnormal blood clots that can lead to stroke. If you have the irregular heart rhythm of atrial fibrillation, you should be on a blood thinner unless there is a good reason you cannot take them.  Understand all your medicine instructions.  Make sure that other conditions (such as anemia or atherosclerosis) are addressed. SEEK IMMEDIATE MEDICAL CARE IF:   You have sudden weakness or numbness of the face, arm, or leg, especially on one side of the body.  Your face or eyelid droops to one side.  You have sudden confusion.  You have trouble speaking (aphasia) or understanding.  You have sudden trouble seeing in one or both eyes.  You have sudden trouble walking.  You have dizziness.  You have a loss of balance or coordination.  You have a sudden,  severe headache with no known cause.  You have new chest pain or an irregular heartbeat. Any of these symptoms may represent a serious problem that is an emergency. Do not wait to see if the symptoms will go away. Get medical help at once. Call your local emergency services (911 in [place] Do not drive yourself to the hospital. Document Released: 03/19/2004 Document Revised: 06/26/2013 Document Reviewed: 08/12/2012 [facility] Patient Information 2015 [place], [place]. This information is not intended to replace advice given to you by your health care provider. Make sure you discuss any questions you have with your health care provider.

## 2014-06-19 NOTE — Progress Notes (Signed)
[facility] [address] [place]. [place] [address] [phone]       OFFICE FOLLOW-UP NOTE  [name] Date of Birth:  1928/09/25 Medical Record Number:  [id number]   HPI: 53 year Caucasian lady seen for first office follow-up visit following hospital admission for stroke on 02/02/14. She presented with sudden onset of slurred speech and right facial droop to the hospital but was not considered tPA candidate due to being outside the window. CT scan of the head on admission was unremarkable but MRI scan showed an acute left basal ganglia infarct and moderate changes of chronic small vessel disease. MRA of the brain showed no large vessel intracranial stenosis or occlusion but there was mild distal basilar artery stenosis. Transthoracic echo showed ejection fraction of 40% but no definite cardiac source of emboli was noted. Cholesterol profile is significant for LDL of 86 and total cholesterol 188 mg percent. Patient had history of GI bleeding on Plavix and stomach intolerance with aspirin hence she was started on Aggrenox for secondary stroke prevention which she seems to be tolerating quite well without significant headaches or GI upset. She however has been complaining of feeling tired all the time. She has recovered her strength to some degree but all. Complains of mild right facial droop and right hand weakness. She also complains of significant tiredness and wonders if this may be Aggrenox related. She does take 3 different medications for blood pressure which usually runs in the 120s to 130s range. She has noted also decreased vision though she does have a history of glaucoma and has seen a local ophthalmologist who has referred her to see a specialist at [facility] soon. She also complains of slowing of mental processing and difficulty in forming words. She lives by herself and does have home health coming in and helping her.   ROS:   14 system review of systems is  positive for  activity and appetite change, fatigue, weight change, hearing loss, light sensitivity, loss of vision, eye pain, blurred vision, shortness of breath, palpitations, joint and back pain, walking difficulty, neck pain and facial drooping  PMH:  Past Medical History  Diagnosis Date  . Thyroid disease   . Hyperlipidemia   . Hypertension   . Glaucoma   . Arthritis   . Bladder infection   . Stroke   . Glaucoma     Social History:  History   Social History  . Marital Status: Married    Spouse Name: deceased  . Number of Children: 0  . Years of Education: 14   Occupational History  . retired     Glass blower/designer   Social History Main Topics  . Smoking status: Never Smoker   . Smokeless tobacco: Not on file  . Alcohol Use: No  . Drug Use: No  . Sexual Activity: Not on file   Other Topics Concern  . Not on file   Social History Narrative   Widowed, lives alone   Caffeine use- 1 cup coffee daily   Right handed   No children    Medications:   Current Outpatient Prescriptions on File Prior to Visit  Medication Sig Dispense Refill  . amLODipine (NORVASC) 10 MG tablet Take 1 tablet (10 mg total) by mouth daily. 30 tablet 1  . benazepril (LOTENSIN) 40 MG tablet Take 1 tablet (40 mg total) by mouth daily. 30 tablet 1  . Cholecalciferol (VITAMIN D) 2000 UNITS CAPS Take 1 capsule by mouth daily.    Marland Kitchen  dipyridamole-aspirin (AGGRENOX) 200-25 MG per 12 hr capsule Take 1 capsule by mouth daily after supper. 30 capsule 1  . dipyridamole-aspirin (AGGRENOX) 200-25 MG per 12 hr capsule Take 1 capsule by mouth 2 (two) times daily. 60 capsule 1  . dorzolamide-timolol (COSOPT) 22.3-6.8 MG/ML ophthalmic solution Place 1 drop into both eyes 2 (two) times daily.    . hydrALAZINE (APRESOLINE) 25 MG tablet Take 1 tablet (25 mg total) by mouth every 8 (eight) hours. 90 tablet 1  . latanoprost (XALATAN) 0.005 % ophthalmic solution Place 1 drop into both eyes at bedtime.     Marland Kitchen  levothyroxine (SYNTHROID, LEVOTHROID) 100 MCG tablet Take 1 tablet (100 mcg total) by mouth daily. 30 tablet 1  . Multiple Vitamins-Minerals (CENTRUM SILVER PO) Take 1 tablet by mouth daily.    . simvastatin (ZOCOR) 40 MG tablet Take 40 mg by mouth every evening.     No current facility-administered medications on file prior to visit.    Allergies:  No Known Allergies  Physical Exam General: well developed, well nourished, seated, in no evident distress Head: head normocephalic and atraumatic.  Neck: supple with no carotid or supraclavicular bruits Cardiovascular: regular rate and rhythm, no murmurs Musculoskeletal: no deformity Skin:  no rash/petichiae. Old surgical scar anterior neck from prior thyroid surgery Vascular:  Normal pulses all extremities Filed Vitals:   06/19/14 1303  BP: 124/61  Pulse: 58   Neurologic Exam Mental Status: Awake and fully alert. Oriented to place and time. Recent and remote memory intact. Attention span, concentration and fund of knowledge appropriate. Mood and affect appropriate.  Cranial Nerves: Fundoscopic exam reveals sharp disc margins. Pupils equal, briskly reactive to light. Extraocular movements full without nystagmus. Visual fields full to confrontation. Hearing mildly diminished bilaterally. Facial sensation intact. Mild right facial droop.Tongue, palate moves normally and symmetrically.  Motor: Normal bulk and tone. Normal strength in all tested extremity muscles. Diminished fine finger movements on the right. Orbits left over right upper extremity. Sensory.: intact to touch ,pinprick .position and vibratory sensation.  Coordination: Rapid alternating movements normal in all extremities. Finger-to-nose and heel-to-shin performed accurately bilaterally. Gait and Station: Arises from chair without difficulty. Stance is normal. Gait demonstrates normal stride length and balance . Unable to heel, toe and tandem walk without difficulty.  Reflexes: 1+  and symmetric. Toes downgoing.   NIHSS  1 Modified Rankin  2   ASSESSMENT: 52 year Caucasian lady with left basal ganglia infarct in December 2015 secondary to small vessel disease with vascular risk factors of hypertension, hyperlipidemia age and sex   PLAN: I had a long d/w patient and nephew about her recent stroke, risk for recurrent stroke/TIAs, personally independently reviewed imaging studies and stroke evaluation results and answered questions.Continue aggrenox  for secondary stroke prevention and maintain strict control of hypertension with blood pressure goal below 130/90, diabetes with hemoglobin A1c goal below 6.5% and lipids with LDL cholesterol goal below 100 mg/dL. I also advised the patient to eat a healthy diet with plenty of whole grains, cereals, fruits and vegetables, exercise regularly and maintain ideal body weight. I advised her to discuss possibly reducing her blood pressure medications with her primary physician Dr. Joylene Draft since she is complaining of tiredness which may be medication side effect Followup in the future with me in  6 months or call earlier if necessary.    Note: This document was prepared with digital dictation and possible smart phrase technology. Any transcriptional errors that result from this process are unintentional

## 2014-06-25 DIAGNOSIS — H4011X3 Primary open-angle glaucoma, severe stage: Secondary | ICD-10-CM | POA: Diagnosis not present

## 2014-08-13 DIAGNOSIS — H3531 Nonexudative age-related macular degeneration: Secondary | ICD-10-CM | POA: Diagnosis not present

## 2014-08-13 DIAGNOSIS — H4011X3 Primary open-angle glaucoma, severe stage: Secondary | ICD-10-CM | POA: Diagnosis not present

## 2014-10-31 DIAGNOSIS — R5383 Other fatigue: Secondary | ICD-10-CM | POA: Diagnosis not present

## 2014-10-31 DIAGNOSIS — Z23 Encounter for immunization: Secondary | ICD-10-CM | POA: Diagnosis not present

## 2014-10-31 DIAGNOSIS — Z6824 Body mass index (BMI) 24.0-24.9, adult: Secondary | ICD-10-CM | POA: Diagnosis not present

## 2014-10-31 DIAGNOSIS — R7301 Impaired fasting glucose: Secondary | ICD-10-CM | POA: Diagnosis not present

## 2014-10-31 DIAGNOSIS — I69959 Hemiplegia and hemiparesis following unspecified cerebrovascular disease affecting unspecified side: Secondary | ICD-10-CM | POA: Diagnosis not present

## 2014-10-31 DIAGNOSIS — E039 Hypothyroidism, unspecified: Secondary | ICD-10-CM | POA: Diagnosis not present

## 2014-10-31 DIAGNOSIS — I10 Essential (primary) hypertension: Secondary | ICD-10-CM | POA: Diagnosis not present

## 2014-10-31 DIAGNOSIS — I638 Other cerebral infarction: Secondary | ICD-10-CM | POA: Diagnosis not present

## 2014-12-20 ENCOUNTER — Ambulatory Visit (INDEPENDENT_AMBULATORY_CARE_PROVIDER_SITE_OTHER): Payer: Medicare Other | Admitting: Neurology

## 2014-12-20 ENCOUNTER — Encounter: Payer: Self-pay | Admitting: Neurology

## 2014-12-20 VITALS — BP 158/73 | HR 66 | Ht 66.0 in | Wt 145.8 lb

## 2014-12-20 DIAGNOSIS — I639 Cerebral infarction, unspecified: Secondary | ICD-10-CM

## 2014-12-20 DIAGNOSIS — I6930 Unspecified sequelae of cerebral infarction: Secondary | ICD-10-CM

## 2014-12-20 NOTE — Patient Instructions (Signed)
I had a long d/w patient and neighbour about her remote stroke, risk for recurrent stroke/TIAs, personally independently reviewed imaging studies and stroke evaluation results and answered questions.Continue Aggrenox  for secondary stroke prevention and maintain strict control of hypertension with blood pressure goal below 130/90, diabetes with hemoglobin A1c goal below 6.5% and lipids with LDL cholesterol goal below 100 mg/dL. I also advised the patient to eat a healthy diet with plenty of whole grains, cereals, fruits and vegetables, exercise regularly and maintain ideal body weight .she is doing well from the stroke standpoint but unfortunately her vision acuity has significantly declined from glaucoma and macular degeneration. Since she will find it difficult to keep follow-up appointments no routine follow-up appointment will be scheduled but she may return for follow-up in the future only as needed.

## 2014-12-20 NOTE — Progress Notes (Signed)
[facility] [address] [place]. [place] [address] [phone]       OFFICE FOLLOW-UP NOTE  [name] Date of Birth:  Feb 05, 1929 Medical Record Number:  [id number]   HPI: 4 year Caucasian lady seen for first office follow-up visit following hospital admission for stroke on 02/02/14. She presented with sudden onset of slurred speech and right facial droop to the hospital but was not considered tPA candidate due to being outside the window. CT scan of the head on admission was unremarkable but MRI scan showed an acute left basal ganglia infarct and moderate changes of chronic small vessel disease. MRA of the brain showed no large vessel intracranial stenosis or occlusion but there was mild distal basilar artery stenosis. Transthoracic echo showed ejection fraction of 40% but no definite cardiac source of emboli was noted. Cholesterol profile is significant for LDL of 86 and total cholesterol 188 mg percent. Patient had history of GI bleeding on Plavix and stomach intolerance with aspirin hence she was started on Aggrenox for secondary stroke prevention which she seems to be tolerating quite well without significant headaches or GI upset. She however has been complaining of feeling tired all the time. She has recovered her strength to some degree but all. Complains of mild right facial droop and right hand weakness. She also complains of significant tiredness and wonders if this may be Aggrenox related. She does take 3 different medications for blood pressure which usually runs in the 120s to 130s range. She has noted also decreased vision though she does have a history of glaucoma and has seen a local ophthalmologist who has referred her to see a specialist at [facility] soon. She also complains of slowing of mental processing and difficulty in forming words. She lives by herself and does have home health coming in and helping her. Update 12/20/2014 : She returns for follow-up  after last visit 6 months ago. She is accompanied by her neighbor and friend Stanton Kidney. She has had significant increase in her vision due to worsening of glaucoma and macular degeneration and now can barely see beyond a few feet. She continues to live alone and is quite lonely after the death of her dog this summer. She remains on Aggrenox which is tolerating well without side effects. She states her blood pressure is usually in the 150s. She had lipid profile checked last month by her primary physician and apparently it was fine. She remains on Zocor and fish oil which is tolerating well. She's had no recurrent stroke or TIA symptoms. She continues to have a little bit of numbness in the right foot and occasional dragging but otherwise has made a full recovery from her stroke.  ROS:   14 system review of systems is positive for    fatigue,  hearing loss, light sensitivity, redness of the eyes, loss of vision, frequent waking, decreased vision acuity, joint pain, back pain, walking difficulty, neck pain, neck stiffness, weakness, depression and all other systems negative PMH:  Past Medical History  Diagnosis Date  . Thyroid disease   . Hyperlipidemia   . Hypertension   . Glaucoma   . Arthritis   . Bladder infection   . Stroke ([place])   . Glaucoma     Social History:  Social History   Social History  . Marital Status: Married    Spouse Name: deceased  . Number of Children: 0  . Years of Education: 14   Occupational History  . retired  office manager   Social History Main Topics  . Smoking status: Never Smoker   . Smokeless tobacco: Not on file  . Alcohol Use: No  . Drug Use: No  . Sexual Activity: Not on file   Other Topics Concern  . Not on file   Social History Narrative   Widowed, lives alone   Caffeine use- 1 cup coffee daily   Right handed   No children    Medications:   Current Outpatient Prescriptions on File Prior to Visit  Medication Sig Dispense Refill  .  amLODipine (NORVASC) 10 MG tablet Take 1 tablet (10 mg total) by mouth daily. 30 tablet 1  . benazepril (LOTENSIN) 40 MG tablet Take 1 tablet (40 mg total) by mouth daily. 30 tablet 1  . Cholecalciferol (VITAMIN D) 2000 UNITS CAPS Take 1 capsule by mouth daily.    Marland Kitchen dipyridamole-aspirin (AGGRENOX) 200-25 MG per 12 hr capsule Take 1 capsule by mouth 2 (two) times daily. 60 capsule 1  . dorzolamide-timolol (COSOPT) 22.3-6.8 MG/ML ophthalmic solution Place 1 drop into both eyes 2 (two) times daily.    . hydrALAZINE (APRESOLINE) 25 MG tablet Take 1 tablet (25 mg total) by mouth every 8 (eight) hours. 90 tablet 1  . latanoprost (XALATAN) 0.005 % ophthalmic solution Place 1 drop into both eyes at bedtime.     Marland Kitchen levothyroxine (SYNTHROID, LEVOTHROID) 100 MCG tablet Take 1 tablet (100 mcg total) by mouth daily. 30 tablet 1  . Multiple Vitamins-Minerals (CENTRUM SILVER PO) Take 1 tablet by mouth daily.    . simvastatin (ZOCOR) 40 MG tablet Take 40 mg by mouth every evening.     No current facility-administered medications on file prior to visit.    Allergies:  No Known Allergies  Physical Exam General: well developed, well nourished, seated, in no evident distress Head: head normocephalic and atraumatic.  Neck: supple with no carotid or supraclavicular bruits Cardiovascular: regular rate and rhythm, no murmurs Musculoskeletal: no deformity Skin:  no rash/petichiae. Old surgical scar anterior neck from prior thyroid surgery Vascular:  Normal pulses all extremities Eyes are reddish with conjunctival injection bilaterally Filed Vitals:   12/20/14 1134  BP: 158/73  Pulse: 66   Neurologic Exam Mental Status: Awake and fully alert. Oriented to place and time. Recent and remote memory intact. Attention span, concentration and fund of knowledge appropriate. Mood and affect appropriate.  Cranial Nerves: Fundoscopic exam not done . Pupils equal, sluggishly reactive to light. Poor vision acuity  bilaterally with finger counting at 2 feet only Extraocular movements full without nystagmus. Visual fields full to confrontation. Hearing mildly diminished bilaterally. Facial sensation intact. Mild right facial droop.Tongue, palate moves normally and symmetrically.  Motor: Normal bulk and tone. Normal strength in all tested extremity muscles. Diminished fine finger movements on the right. Orbits left over right upper extremity. Sensory.: intact to touch ,pinprick .position and vibratory sensation.  Coordination: Rapid alternating movements normal in all extremities. Finger-to-nose and heel-to-shin performed accurately bilaterally. Gait and Station: Arises from chair without difficulty. Stance is normal. Gait demonstrates normal stride length and balance . Unable to heel, toe and tandem walk without difficulty.  Reflexes: 1+ and symmetric. Toes downgoing.   NIHSS  1 Modified Rankin  2   ASSESSMENT: 17 year Caucasian lady with left basal ganglia infarct in December 2015 secondary to small vessel disease with vascular risk factors of hypertension, hyperlipidemia age and sex   PLAN:  I had a long d/w patient and neighbour about her remote  stroke, risk for recurrent stroke/TIAs, personally independently reviewed imaging studies and stroke evaluation results and answered questions.Continue Aggrenox  for secondary stroke prevention and maintain strict control of hypertension with blood pressure goal below 130/90, diabetes with hemoglobin A1c goal below 6.5% and lipids with LDL cholesterol goal below 100 mg/dL. I also advised the patient to eat a healthy diet with plenty of whole grains, cereals, fruits and vegetables, exercise regularly and maintain ideal body weight .she is doing well from the stroke standpoint but unfortunately her vision acuity has significantly declined from glaucoma and macular degeneration. Since she will find it difficult to keep follow-up appointments no routine follow-up  appointment will be scheduled but she may return for follow-up in the future only as needed.   Antony Contras, MD   Note: This document was prepared with digital dictation and possible smart phrase technology. Any transcriptional errors that result from this process are unintentional

## 2014-12-24 DIAGNOSIS — H401133 Primary open-angle glaucoma, bilateral, severe stage: Secondary | ICD-10-CM | POA: Diagnosis not present

## 2014-12-24 DIAGNOSIS — H353190 Nonexudative age-related macular degeneration, unspecified eye, stage unspecified: Secondary | ICD-10-CM | POA: Diagnosis not present

## 2014-12-26 DIAGNOSIS — H401133 Primary open-angle glaucoma, bilateral, severe stage: Secondary | ICD-10-CM | POA: Diagnosis not present

## 2014-12-26 DIAGNOSIS — H353132 Nonexudative age-related macular degeneration, bilateral, intermediate dry stage: Secondary | ICD-10-CM | POA: Diagnosis not present

## 2014-12-26 DIAGNOSIS — H35361 Drusen (degenerative) of macula, right eye: Secondary | ICD-10-CM | POA: Diagnosis not present

## 2015-02-04 DIAGNOSIS — H401133 Primary open-angle glaucoma, bilateral, severe stage: Secondary | ICD-10-CM | POA: Diagnosis not present

## 2015-04-15 DIAGNOSIS — H401133 Primary open-angle glaucoma, bilateral, severe stage: Secondary | ICD-10-CM | POA: Diagnosis not present

## 2015-05-27 DIAGNOSIS — H401133 Primary open-angle glaucoma, bilateral, severe stage: Secondary | ICD-10-CM | POA: Diagnosis not present

## 2015-05-28 ENCOUNTER — Ambulatory Visit: Payer: Medicare Other | Attending: Ophthalmology | Admitting: Occupational Therapy

## 2015-05-28 DIAGNOSIS — R41842 Visuospatial deficit: Secondary | ICD-10-CM | POA: Diagnosis not present

## 2015-05-28 NOTE — Therapy (Signed)
[facility] [address] [place], [place], [address] Phone: [phone]   Fax:  [phone]  Occupational Therapy Evaluation  Patient Details  Name: Amanda Cochran MRN: [id number] Date of Birth: 1928-12-28 Referring Provider: Dr. Edilia Bo  Encounter Date: 05/28/2015      OT End of Session - 05/28/15 1626    Visit Number 1   Number of Visits 1   Authorization Type Medicare   OT Start Time 1107   OT Stop Time 1205   OT Time Calculation (min) 58 min   Activity Tolerance Patient tolerated treatment well   Behavior During Therapy [facility] for tasks assessed/performed      Past Medical History  Diagnosis Date  . Thyroid disease   . Hyperlipidemia   . Hypertension   . Glaucoma   . Arthritis   . Bladder infection   . Stroke ([place])   . Glaucoma     Past Surgical History  Procedure Laterality Date  . Thyroid surgery  1976  . Tonsillectomy and adenoidectomy    . Vaginal prolapse repair    . Cataract surgery Bilateral 2015    There were no vitals filed for this visit.  Visit Diagnosis:  Visuospatial deficit - Plan: Ot plan of care cert/re-cert      Subjective Assessment - 05/28/15 1110    Subjective  Pt reports glaucoma for about 30 years with progressive decline in vision   Pertinent History see Epic   Patient Stated Goals Any aids that may assist vision   Currently in Pain? No/denies           [facility] OT Assessment - 05/28/15 0001    Assessment   Diagnosis glaucoma   Referring Provider Dr. Edilia Bo   Onset Date 04/16/15   Assessment Pt with low vision as a result of  glaucoma, has  a history of CVA in 2015.   Precautions   Precautions Fall   Balance Screen   Has the patient fallen in the past 6 months No   Has the patient had a decrease in activity level because of a fear of falling?  No   Is the patient reluctant to leave their home because of a fear of falling?  No   Home  Environment   Family/patient  expects to be discharged to: Private residence   Living Arrangements Alone   Type of [place] Two level   Alternate Level Stairs - Number of Steps 7   Bathroom Shower/Tub Tub/Shower unit   Bathroom Accessibility Yes   Additional Comments has tub seat, and handheld shower   Lives With Alone   Prior Function   Level of Independence Independent with basic ADLs   Vocation Retired   ADL   ADL comments Pt is modified independent with basic ADLs.   IADL   Meal Prep --  uses microwave primarily, gets meals on wheels   Mobility   Mobility Status Independent  uses cane in the community   Vision - History   Baseline Vision Other (comment)   Visual History Glaucome   Patient Visual Report --  blurry vision, decreased depth perception per pt report   Vision Assessment   Vision Assessment Vision tested   Visual Acuity Per MD/OD report   Per MD/OD Report OD CF at face, OS 20/60   Reading Acuity 20/160   Depth Perception Pt reports decreased depth perception.   Patient has diffculty with activities  due to visual impairment Reading bills   Comment Pt reports difficulty reading a menu.Pt demonstrates some difficulty navigating an unfamiliar environment.   Cognition   Overall Cognitive Status Within Functional Limits for tasks assessed   Mini Mental State Exam  29/30                  OT Treatments/Exercises (OP) - 22-Jun-2015 0001    ADLs   Overall ADLs Therapist recommends use of hi marks on microwave and washing machine, pt/ caregiver verbalize understanding.   ADL Comments Pt was instructed in use of 3x stand and 4x handheld magnifiers . Pt demonstrates ability to read continuous text with both following instruction.  Pt was shown 2x gooseneck magnifier, pt plans to pursue 3x hands free magnifier for bill pay. Pt was shown CCTV, given info for [facility] [place] - June 22, 2015 1627     Clinical Impression Statement Pt with glaucoma presents with visual deficits which impede performance of ADLs/ IADLs and ability to read. Pt can benefit from skilled occupational therapy.   Rehab Potential Good   OT Frequency One time visit   OT Duration 8 weeks   OT Treatment/Interventions Self-care/ADL training;Patient/family education;Visual/perceptual remediation/compensation;DME and/or AE instruction   Plan Pt was provided with education regarding adaptive equipment on day of evaluation. No additional visists are recommended and therefore no goals were set at this time.   Consulted and Agree with Plan of Care Patient;Family member/caregiver   Family Member Consulted driver.          G-Codes - 2015/06/22 1636    Functional Assessment Tool Used clinical impressions   Functional Limitation Self care   Self Care Current Status [phone]) At least 1 percent but less than 20 percent impaired, limited or restricted   Self Care Goal Status [id number]) At least 1 percent but less than 20 percent impaired, limited or restricted   Self Care Discharge Status [id number]) At least 1 percent but less than 20 percent impaired, limited or restricted      Problem List Patient Active Problem List   Diagnosis Date Noted  . Left sided lacunar stroke ([place]) 02/14/2014  . Right hemiparesis ([place]) 02/09/2014  . CVA (cerebral infarction) 02/07/2014  . Hyperlipidemia   . Essential hypertension 02/03/2014    , 22-Jun-2015, 4:40 PM Theone Murdoch, OTR/L [phone] Phone: [phone] 4:40 PM 06-22-15 [facility] [address] [place], [place], [address] Phone: [phone]   Fax:  [phone]  Name: SANA KEESEE MRN: [id number] Date of Birth: [birth year]

## 2015-05-28 NOTE — Patient Instructions (Signed)
[facility] regarding CCTV [phone]

## 2015-06-20 DIAGNOSIS — I10 Essential (primary) hypertension: Secondary | ICD-10-CM | POA: Diagnosis not present

## 2015-06-20 DIAGNOSIS — R8299 Other abnormal findings in urine: Secondary | ICD-10-CM | POA: Diagnosis not present

## 2015-06-20 DIAGNOSIS — E784 Other hyperlipidemia: Secondary | ICD-10-CM | POA: Diagnosis not present

## 2015-06-20 DIAGNOSIS — N390 Urinary tract infection, site not specified: Secondary | ICD-10-CM | POA: Diagnosis not present

## 2015-06-20 DIAGNOSIS — E559 Vitamin D deficiency, unspecified: Secondary | ICD-10-CM | POA: Diagnosis not present

## 2015-06-20 DIAGNOSIS — R7301 Impaired fasting glucose: Secondary | ICD-10-CM | POA: Diagnosis not present

## 2015-06-20 DIAGNOSIS — E038 Other specified hypothyroidism: Secondary | ICD-10-CM | POA: Diagnosis not present

## 2015-06-26 DIAGNOSIS — H4720 Unspecified optic atrophy: Secondary | ICD-10-CM | POA: Diagnosis not present

## 2015-06-26 DIAGNOSIS — H353132 Nonexudative age-related macular degeneration, bilateral, intermediate dry stage: Secondary | ICD-10-CM | POA: Diagnosis not present

## 2015-06-26 DIAGNOSIS — R443 Hallucinations, unspecified: Secondary | ICD-10-CM | POA: Diagnosis not present

## 2015-06-26 DIAGNOSIS — H401133 Primary open-angle glaucoma, bilateral, severe stage: Secondary | ICD-10-CM | POA: Diagnosis not present

## 2015-06-27 DIAGNOSIS — E784 Other hyperlipidemia: Secondary | ICD-10-CM | POA: Diagnosis not present

## 2015-06-27 DIAGNOSIS — I493 Ventricular premature depolarization: Secondary | ICD-10-CM | POA: Diagnosis not present

## 2015-06-27 DIAGNOSIS — Z1231 Encounter for screening mammogram for malignant neoplasm of breast: Secondary | ICD-10-CM | POA: Diagnosis not present

## 2015-06-27 DIAGNOSIS — R3129 Other microscopic hematuria: Secondary | ICD-10-CM | POA: Diagnosis not present

## 2015-06-27 DIAGNOSIS — H8110 Benign paroxysmal vertigo, unspecified ear: Secondary | ICD-10-CM | POA: Diagnosis not present

## 2015-06-27 DIAGNOSIS — H4089 Other specified glaucoma: Secondary | ICD-10-CM | POA: Diagnosis not present

## 2015-06-27 DIAGNOSIS — Z0000 Encounter for general adult medical examination without abnormal findings: Secondary | ICD-10-CM | POA: Diagnosis not present

## 2015-06-27 DIAGNOSIS — G458 Other transient cerebral ischemic attacks and related syndromes: Secondary | ICD-10-CM | POA: Diagnosis not present

## 2015-06-27 DIAGNOSIS — Z6824 Body mass index (BMI) 24.0-24.9, adult: Secondary | ICD-10-CM | POA: Diagnosis not present

## 2015-06-27 DIAGNOSIS — I69959 Hemiplegia and hemiparesis following unspecified cerebrovascular disease affecting unspecified side: Secondary | ICD-10-CM | POA: Diagnosis not present

## 2015-06-27 DIAGNOSIS — Z1389 Encounter for screening for other disorder: Secondary | ICD-10-CM | POA: Diagnosis not present

## 2015-06-27 DIAGNOSIS — I638 Other cerebral infarction: Secondary | ICD-10-CM | POA: Diagnosis not present

## 2015-08-05 DIAGNOSIS — H401133 Primary open-angle glaucoma, bilateral, severe stage: Secondary | ICD-10-CM | POA: Diagnosis not present

## 2015-11-20 DIAGNOSIS — H401133 Primary open-angle glaucoma, bilateral, severe stage: Secondary | ICD-10-CM | POA: Diagnosis not present

## 2015-12-30 DIAGNOSIS — Z23 Encounter for immunization: Secondary | ICD-10-CM | POA: Diagnosis not present

## 2015-12-30 DIAGNOSIS — F329 Major depressive disorder, single episode, unspecified: Secondary | ICD-10-CM | POA: Diagnosis not present

## 2015-12-30 DIAGNOSIS — E038 Other specified hypothyroidism: Secondary | ICD-10-CM | POA: Diagnosis not present

## 2015-12-30 DIAGNOSIS — I10 Essential (primary) hypertension: Secondary | ICD-10-CM | POA: Diagnosis not present

## 2015-12-30 DIAGNOSIS — I69959 Hemiplegia and hemiparesis following unspecified cerebrovascular disease affecting unspecified side: Secondary | ICD-10-CM | POA: Diagnosis not present

## 2015-12-30 DIAGNOSIS — N390 Urinary tract infection, site not specified: Secondary | ICD-10-CM | POA: Diagnosis not present

## 2015-12-30 DIAGNOSIS — R7301 Impaired fasting glucose: Secondary | ICD-10-CM | POA: Diagnosis not present

## 2015-12-30 DIAGNOSIS — Z6823 Body mass index (BMI) 23.0-23.9, adult: Secondary | ICD-10-CM | POA: Diagnosis not present

## 2015-12-30 DIAGNOSIS — R8299 Other abnormal findings in urine: Secondary | ICD-10-CM | POA: Diagnosis not present

## 2015-12-30 DIAGNOSIS — E784 Other hyperlipidemia: Secondary | ICD-10-CM | POA: Diagnosis not present

## 2016-03-02 DIAGNOSIS — H401133 Primary open-angle glaucoma, bilateral, severe stage: Secondary | ICD-10-CM | POA: Diagnosis not present

## 2016-05-25 DIAGNOSIS — H401133 Primary open-angle glaucoma, bilateral, severe stage: Secondary | ICD-10-CM | POA: Diagnosis not present

## 2016-06-29 DIAGNOSIS — H401133 Primary open-angle glaucoma, bilateral, severe stage: Secondary | ICD-10-CM | POA: Diagnosis not present

## 2016-07-15 DIAGNOSIS — E038 Other specified hypothyroidism: Secondary | ICD-10-CM | POA: Diagnosis not present

## 2016-07-15 DIAGNOSIS — E559 Vitamin D deficiency, unspecified: Secondary | ICD-10-CM | POA: Diagnosis not present

## 2016-07-15 DIAGNOSIS — I10 Essential (primary) hypertension: Secondary | ICD-10-CM | POA: Diagnosis not present

## 2016-07-15 DIAGNOSIS — M859 Disorder of bone density and structure, unspecified: Secondary | ICD-10-CM | POA: Diagnosis not present

## 2016-07-15 DIAGNOSIS — R8299 Other abnormal findings in urine: Secondary | ICD-10-CM | POA: Diagnosis not present

## 2016-07-15 DIAGNOSIS — N390 Urinary tract infection, site not specified: Secondary | ICD-10-CM | POA: Diagnosis not present

## 2016-07-15 DIAGNOSIS — R7301 Impaired fasting glucose: Secondary | ICD-10-CM | POA: Diagnosis not present

## 2016-07-22 DIAGNOSIS — I638 Other cerebral infarction: Secondary | ICD-10-CM | POA: Diagnosis not present

## 2016-07-22 DIAGNOSIS — I69959 Hemiplegia and hemiparesis following unspecified cerebrovascular disease affecting unspecified side: Secondary | ICD-10-CM | POA: Diagnosis not present

## 2016-07-22 DIAGNOSIS — R5383 Other fatigue: Secondary | ICD-10-CM | POA: Diagnosis not present

## 2016-07-22 DIAGNOSIS — N390 Urinary tract infection, site not specified: Secondary | ICD-10-CM | POA: Diagnosis not present

## 2016-07-22 DIAGNOSIS — R808 Other proteinuria: Secondary | ICD-10-CM | POA: Diagnosis not present

## 2016-07-22 DIAGNOSIS — F3289 Other specified depressive episodes: Secondary | ICD-10-CM | POA: Diagnosis not present

## 2016-07-22 DIAGNOSIS — Z1231 Encounter for screening mammogram for malignant neoplasm of breast: Secondary | ICD-10-CM | POA: Diagnosis not present

## 2016-07-22 DIAGNOSIS — H8113 Benign paroxysmal vertigo, bilateral: Secondary | ICD-10-CM | POA: Diagnosis not present

## 2016-07-22 DIAGNOSIS — Z0000 Encounter for general adult medical examination without abnormal findings: Secondary | ICD-10-CM | POA: Diagnosis not present

## 2016-07-22 DIAGNOSIS — G458 Other transient cerebral ischemic attacks and related syndromes: Secondary | ICD-10-CM | POA: Diagnosis not present

## 2016-07-22 DIAGNOSIS — Z6823 Body mass index (BMI) 23.0-23.9, adult: Secondary | ICD-10-CM | POA: Diagnosis not present

## 2016-07-22 DIAGNOSIS — Z1389 Encounter for screening for other disorder: Secondary | ICD-10-CM | POA: Diagnosis not present

## 2016-08-11 ENCOUNTER — Encounter (INDEPENDENT_AMBULATORY_CARE_PROVIDER_SITE_OTHER): Payer: Self-pay | Admitting: Orthopaedic Surgery

## 2016-08-11 ENCOUNTER — Ambulatory Visit (INDEPENDENT_AMBULATORY_CARE_PROVIDER_SITE_OTHER): Payer: Medicare Other

## 2016-08-11 ENCOUNTER — Ambulatory Visit (INDEPENDENT_AMBULATORY_CARE_PROVIDER_SITE_OTHER): Payer: Medicare Other | Admitting: Orthopaedic Surgery

## 2016-08-11 VITALS — BP 137/84 | HR 88 | Resp 14 | Ht 66.0 in | Wt 145.0 lb

## 2016-08-11 DIAGNOSIS — M5440 Lumbago with sciatica, unspecified side: Secondary | ICD-10-CM

## 2016-08-11 MED ORDER — METHYLPREDNISOLONE ACETATE 40 MG/ML IJ SUSP
40.0000 mg | INTRAMUSCULAR | Status: AC | PRN
  Administered 2016-08-11: 40 mg via INTRAMUSCULAR

## 2016-08-11 MED ORDER — LIDOCAINE HCL 2 % IJ SOLN
2.0000 mL | INTRAMUSCULAR | Status: AC | PRN
  Administered 2016-08-11: 2 mL

## 2016-08-11 NOTE — Progress Notes (Signed)
Office Visit Note   Patient: Amanda Cochran           Date of Birth: Feb 21, 1929           MRN: [id number] Visit Date: 08/11/2016              Requested by: Crist Infante, MD [address] [place], [place] [address] PCP: Crist Infante, MD   Assessment & Plan: Visit Diagnoses:  1. Bilateral low back pain with sciatica, sciatica laterality unspecified, unspecified chronicity   Generative arthritis lumbar spine specifically at L4-5 and L5-S1. Is suspect pain in the area of the right buttock is referred from the back  Plan: Local cortisone injection paralumbar region to the right and to area of local tenderness. Follow-up 2 weeks. If no improvement consider MRI scan lumbar spine  Follow-Up Instructions: Return in about 2 weeks (around 08/25/2016).   Orders:  Orders Placed This Encounter  Procedures  . XR Lumbar Spine 2-3 Views  . XR HIP UNILAT W OR W/O PELVIS 2-3 VIEWS RIGHT   No orders of the defined types were placed in this encounter.     Procedures: Trigger Point Inj Date/Time: 08/11/2016 12:54 PM Performed by: Garald Balding Authorized by: Garald Balding   Consent Given by:  Patient Indications:  Pain Total # of Trigger Points:  1 Location: back   Needle Size:  22 G Approach:  Dorsal Medications #1:  2 mL lidocaine 2 %; 40 mg methylPREDNISolone acetate 40 MG/ML     Clinical Data: No additional findings.   Subjective: Chief Complaint  Patient presents with  . Lower Back - Pain  . Right Hip - Pain, Weakness    Ms. Hollett is n 57 y o that presents with LBP and R sided hip pain x weeks really worse. SHe relates she has hx of LBP/Hip pain for years.   Mrs. Almendarez is had persistent pain in her back and right buttock for several weeks. She's had some numbness and tingling in burning pain in her right leg but notes at that hasn't changed since she had a stroke in 2015. She's had similar pain in her back but this seems to be "worse" and more prolonged. Denies  bowel or  bladder function change.  HPI  Review of Systems   Objective: Vital Signs: BP 137/84   Pulse 88   Resp 14   Ht 5\' 6"  (1.676 m)   Wt 145 lb (65.8 kg)   BMI 23.40 kg/m   Physical Exam  Ortho Exam awake alert and oriented 3. Hard of hearing. Straight leg raise negative bilaterally. Painless range of motion of both hips and both knees. No increased or decreased reflexes either lower extremity. Motor exam intact. Prior stroke affected right side but didn't appear to have any residual. I. E. Weakness. Mild percussible tenderness of lumbar spine. One area of local tenderness in the right parasacral region. No pain over the ischial tuberosity  Specialty Comments:  No specialty comments available.  Imaging: Xr Hip Unilat W Or W/o Pelvis 2-3 Views Right  Result Date: 08/11/2016 AP pelvis and lateral of the right hip were obtained. There is diffuse bony demineralization. No evidence of a fracture. Minimal degenerative changes with some calcification along the lateral acetabulum greater right than left. Some ectopic calcification of both greater trochanters. Sclerosis about both sacroiliac joints  Xr Lumbar Spine 2-3 Views  Result Date: 08/11/2016 2 views of the lumbar spine obtained in the AP and lateral projection. No  evidence of scoliosis. Diffuse degenerative changes of the facet joints at L4-5 and L5-S1. Anterior listhesis grade 1-2 on L5-S1. Decreased disc space height at L5-S1 with osteophytes projecting both posteriorly and anteriorly. Similar changes at L4-5 with decreased disc space height. Diffuse calcification of the abdominal aorta without obvious aneurysmal dilatation    PMFS History: Patient Active Problem List   Diagnosis Date Noted  . Left sided lacunar stroke ([place]) 02/14/2014  . Right hemiparesis ([place]) 02/09/2014  . CVA (cerebral infarction) 02/07/2014  . Hyperlipidemia   . Essential hypertension 02/03/2014   Past Medical History:  Diagnosis Date  . Arthritis   .  Bladder infection   . Glaucoma   . Glaucoma   . Glaucoma (increased eye pressure)   . Hyperlipidemia   . Hypertension   . Stroke ([place])   . Thyroid disease     No family history on file.  Past Surgical History:  Procedure Laterality Date  . cataract surgery Bilateral 2015  . THYROID SURGERY  1976  . TONSILLECTOMY AND ADENOIDECTOMY    . VAGINAL PROLAPSE REPAIR     Social History   Occupational History  . retired     Glass blower/designer   Social History Main Topics  . Smoking status: Never Smoker  . Smokeless tobacco: Never Used  . Alcohol use No  . Drug use: No  . Sexual activity: Not on file     Garald Balding, MD   Note - This record has been created using [employer].  Chart creation errors have been sought, but may not always  have been located. Such creation errors do not reflect on  the standard of medical care.

## 2016-08-31 ENCOUNTER — Ambulatory Visit (INDEPENDENT_AMBULATORY_CARE_PROVIDER_SITE_OTHER): Payer: Medicare Other | Admitting: Orthopaedic Surgery

## 2016-09-07 ENCOUNTER — Ambulatory Visit (INDEPENDENT_AMBULATORY_CARE_PROVIDER_SITE_OTHER): Payer: Medicare Other | Admitting: Orthopaedic Surgery

## 2016-09-07 ENCOUNTER — Encounter (INDEPENDENT_AMBULATORY_CARE_PROVIDER_SITE_OTHER): Payer: Self-pay | Admitting: Orthopaedic Surgery

## 2016-09-07 ENCOUNTER — Other Ambulatory Visit (INDEPENDENT_AMBULATORY_CARE_PROVIDER_SITE_OTHER): Payer: Self-pay

## 2016-09-07 VITALS — BP 132/70 | HR 99 | Ht 66.0 in | Wt 145.0 lb

## 2016-09-07 DIAGNOSIS — M5440 Lumbago with sciatica, unspecified side: Secondary | ICD-10-CM

## 2016-09-07 DIAGNOSIS — M5441 Lumbago with sciatica, right side: Secondary | ICD-10-CM

## 2016-09-07 DIAGNOSIS — G8929 Other chronic pain: Secondary | ICD-10-CM

## 2016-09-07 NOTE — Progress Notes (Signed)
Office Visit Note   Patient: Amanda Cochran           Date of Birth: [birth year]           MRN: [id number] Visit Date: 09/07/2016              Requested by: Crist Infante, MD [address] [place], [place] [address] PCP: Crist Infante, MD   Assessment & Plan: Visit Diagnoses:  1. Chronic right-sided low back pain with right-sided sciatica   Chronic right-sided low back pain, pelvis and right lower extremity discomfort. Probably related to lumbar spine.  Plan: MRI lumbar spine. Incomplete relief of pain with injection of localized areas of tenderness. At  her last office visit  Follow-Up Instructions: Return after MRI L-S spine.   Orders:  No orders of the defined types were placed in this encounter.  No orders of the defined types were placed in this encounter.     Procedures: No procedures performed   Clinical Data: No additional findings.   Subjective: Chief Complaint  Patient presents with  . Lower Back - Pain    Amanda Cochran is a 83 y o that is here for a follow up of her Low back pain. She relates the cortisone injection helped a little but still in pain/.   I recently injected Amanda Cochran point tenderness in the paralumbar region and she notes that made a little bit of a difference. She's not having as much numbness in her right lower extremity. Was some compromise of her activities. Not having any trouble on the left. Films of the lumbar spine were obtained a month ago. She has diffuse degenerative changes at the facet joints particularly at L4-5 and L5-S1 with an anterior listhesis at L5-S1. HPI  Review of Systems   Objective: Vital Signs: BP 132/70   Pulse 99   Ht 5\' 6"  (1.676 m)   Wt 145 lb (65.8 kg)   BMI 23.40 kg/m   Physical Exam  Ortho Exam painless range of motion of both hips. Straight leg raise negative. Some tenderness along the right paralumbar region but without pacific mass formation. Has had prior stroke in the past with some residual lower extremity  weakness. Does use a cane for balance and because of her weakness.  Specialty Comments:  No specialty comments available.  Imaging: No results found.   PMFS History: Patient Active Problem List   Diagnosis Date Noted  . Left sided lacunar stroke ([place]) 02/14/2014  . Right hemiparesis ([place]) 02/09/2014  . CVA (cerebral infarction) 02/07/2014  . Hyperlipidemia   . Essential hypertension 02/03/2014   Past Medical History:  Diagnosis Date  . Arthritis   . Bladder infection   . Glaucoma   . Glaucoma   . Glaucoma (increased eye pressure)   . Hyperlipidemia   . Hypertension   . Stroke ([place])   . Thyroid disease     History reviewed. No pertinent family history.  Past Surgical History:  Procedure Laterality Date  . cataract surgery Bilateral 2015  . THYROID SURGERY  1976  . TONSILLECTOMY AND ADENOIDECTOMY    . VAGINAL PROLAPSE REPAIR     Social History   Occupational History  . retired     Glass blower/designer   Social History Main Topics  . Smoking status: Never Smoker  . Smokeless tobacco: Never Used  . Alcohol use No  . Drug use: No  . Sexual activity: Not on file     Garald Balding, MD  Note - This record has been created using Dragon software.  Chart creation errors have been sought, but may not always  have been located. Such creation errors do not reflect on  the standard of medical care.  

## 2016-09-23 ENCOUNTER — Ambulatory Visit
Admission: RE | Admit: 2016-09-23 | Discharge: 2016-09-23 | Disposition: A | Discharge status: Home / Self Care | Payer: Medicare Other | Source: Ambulatory Visit | Attending: Orthopaedic Surgery | Admitting: Orthopaedic Surgery

## 2016-09-23 DIAGNOSIS — M5440 Lumbago with sciatica, unspecified side: Secondary | ICD-10-CM

## 2016-09-23 DIAGNOSIS — M5136 Other intervertebral disc degeneration, lumbar region: Secondary | ICD-10-CM | POA: Diagnosis not present

## 2016-10-01 ENCOUNTER — Other Ambulatory Visit (INDEPENDENT_AMBULATORY_CARE_PROVIDER_SITE_OTHER): Payer: Self-pay | Admitting: *Deleted

## 2016-10-01 ENCOUNTER — Encounter (INDEPENDENT_AMBULATORY_CARE_PROVIDER_SITE_OTHER): Payer: Self-pay | Admitting: Orthopaedic Surgery

## 2016-10-01 ENCOUNTER — Ambulatory Visit (INDEPENDENT_AMBULATORY_CARE_PROVIDER_SITE_OTHER): Payer: Medicare Other | Admitting: Orthopaedic Surgery

## 2016-10-01 VITALS — Resp 14 | Ht 66.0 in | Wt 145.0 lb

## 2016-10-01 DIAGNOSIS — M5441 Lumbago with sciatica, right side: Secondary | ICD-10-CM

## 2016-10-01 DIAGNOSIS — M5442 Lumbago with sciatica, left side: Principal | ICD-10-CM

## 2016-10-01 DIAGNOSIS — G8929 Other chronic pain: Secondary | ICD-10-CM | POA: Diagnosis not present

## 2016-10-01 NOTE — Progress Notes (Signed)
Office Visit Note   Patient: Amanda Cochran           Date of Birth: [birth year]           MRN: [id number] Visit Date: 10/01/2016              Requested by: Crist Infante, MD [address] [place], [place] [address] PCP: Crist Infante, MD   Assessment & Plan: Visit Diagnoses:  1. Chronic bilateral low back pain with bilateral sciatica     Plan: MRI scan demonstrates multilevel degenerative changes lumbar spine. Grade 1 anterolisthesis at L5-S1 resulting in severe bilateral neural foraminal stenosis and moderate central spinal canal stenosis at L2-3 and L3-4. Severe right neural foraminal stenosis at L3-4 due to a 7 mm right-sided synovial cyst impinging upon the exiting right L3 nerve root. Findings discussed with Mrs. Lahm. I'd like to proceed with an epidural steroid injection. Discussed this with her as well and we'll set it up at [facility] where she's been in the past  Follow-Up Instructions: Return in about 1 month (around 11/01/2016).   Orders:  No orders of the defined types were placed in this encounter.  No orders of the defined types were placed in this encounter.     Procedures: No procedures performed   Clinical Data: No additional findings.   Subjective: Chief Complaint  Patient presents with  . Lower Back - Results  Mrs. Hackmann relates that her symptoms are essentially unchanged. She does have chronic back pain associated bilateral lower extremity discomfort. She's been having more trouble on the right than the left. She has had a prior stroke that she believes was probably affecting her right lower extremity. She does have a cane which she uses to help with her ambulation. She is having some difficulty sleeping at night because of leg pain  HPI  Review of Systems  Constitutional: Negative for chills, fatigue and fever.  Eyes: Negative for itching.  Respiratory: Negative for chest tightness and shortness of breath.   Cardiovascular: Negative for chest  pain, palpitations and leg swelling.  Gastrointestinal: Negative for blood in stool, constipation and diarrhea.  Musculoskeletal: Negative for back pain, joint swelling, neck pain and neck stiffness.  Neurological: Negative for dizziness and numbness.  Hematological: Does not bruise/bleed easily.  Psychiatric/Behavioral: The patient is not nervous/anxious.   All other systems reviewed and are negative.    Objective: Vital Signs: Resp 14   Ht 5\' 6"  (1.676 m)   Wt 145 lb (65.8 kg)   BMI 23.40 kg/m   Physical Exam  Ortho Exam leg raise negative bilaterally. Patient walks for a slowly with a cane. She does have some problems with her vision and with her hearing. No pain with range of motion of either hip or either knee. Appears to have good lower extremity strength bilaterally  Specialty Comments:  No specialty comments available.  Imaging: No results found.   PMFS History: Patient Active Problem List   Diagnosis Date Noted  . Left sided lacunar stroke ([place]) 02/14/2014  . Right hemiparesis ([place]) 02/09/2014  . CVA (cerebral infarction) 02/07/2014  . Hyperlipidemia   . Essential hypertension 02/03/2014   Past Medical History:  Diagnosis Date  . Arthritis   . Bladder infection   . Glaucoma   . Glaucoma   . Glaucoma (increased eye pressure)   . Hyperlipidemia   . Hypertension   . Stroke ([place])   . Thyroid disease     No family history on file.  Past Surgical History:  Procedure Laterality Date  . cataract surgery Bilateral 2015  . THYROID SURGERY  1976  . TONSILLECTOMY AND ADENOIDECTOMY    . VAGINAL PROLAPSE REPAIR     Social History   Occupational History  . retired     Glass blower/designer   Social History Main Topics  . Smoking status: Never Smoker  . Smokeless tobacco: Never Used  . Alcohol use No  . Drug use: No  . Sexual activity: Not on file

## 2016-10-01 NOTE — Progress Notes (Unsigned)
[id number]

## 2016-10-14 ENCOUNTER — Ambulatory Visit
Admission: RE | Admit: 2016-10-14 | Discharge: 2016-10-14 | Disposition: A | Discharge status: Home / Self Care | Payer: Medicare Other | Source: Ambulatory Visit | Attending: Orthopaedic Surgery | Admitting: Orthopaedic Surgery

## 2016-10-14 DIAGNOSIS — M5441 Lumbago with sciatica, right side: Principal | ICD-10-CM

## 2016-10-14 DIAGNOSIS — M47817 Spondylosis without myelopathy or radiculopathy, lumbosacral region: Secondary | ICD-10-CM | POA: Diagnosis not present

## 2016-10-14 DIAGNOSIS — M5442 Lumbago with sciatica, left side: Principal | ICD-10-CM

## 2016-10-14 DIAGNOSIS — H401133 Primary open-angle glaucoma, bilateral, severe stage: Secondary | ICD-10-CM | POA: Diagnosis not present

## 2016-10-14 DIAGNOSIS — G8929 Other chronic pain: Secondary | ICD-10-CM

## 2016-10-14 MED ORDER — METHYLPREDNISOLONE ACETATE 40 MG/ML INJ SUSP (RADIOLOG
120.0000 mg | Freq: Once | INTRAMUSCULAR | Status: AC
  Administered 2016-10-14: 120 mg via EPIDURAL

## 2016-10-14 MED ORDER — IOPAMIDOL (ISOVUE-M 200) INJECTION 41%
1.0000 mL | Freq: Once | INTRAMUSCULAR | Status: AC
  Administered 2016-10-14: 1 mL via EPIDURAL

## 2016-10-14 NOTE — Discharge Instructions (Signed)

## 2016-10-23 IMAGING — RF DG SWALLOWING FUNCTION - NRPT [facility]
2 series · 14 of 24 positions shown · non-contrast
Comparison: none

[Series 1: run · 31 acquisitions, 12 frames shown (1 of 2)]
[im 1/31]
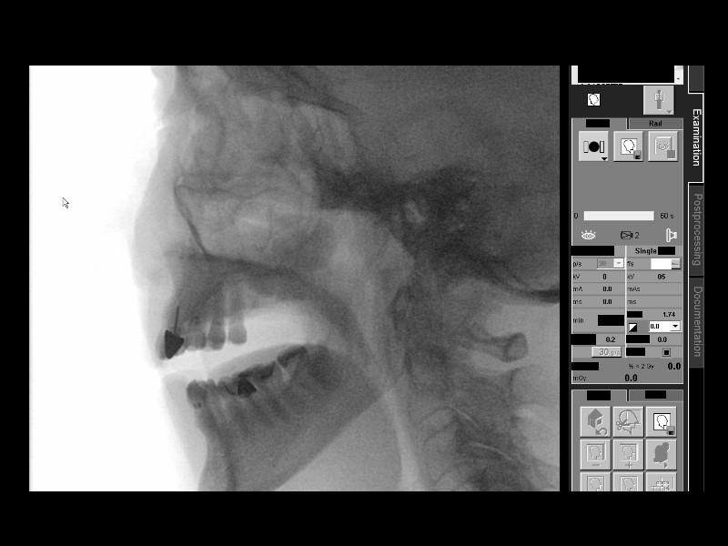
[im 4/31]
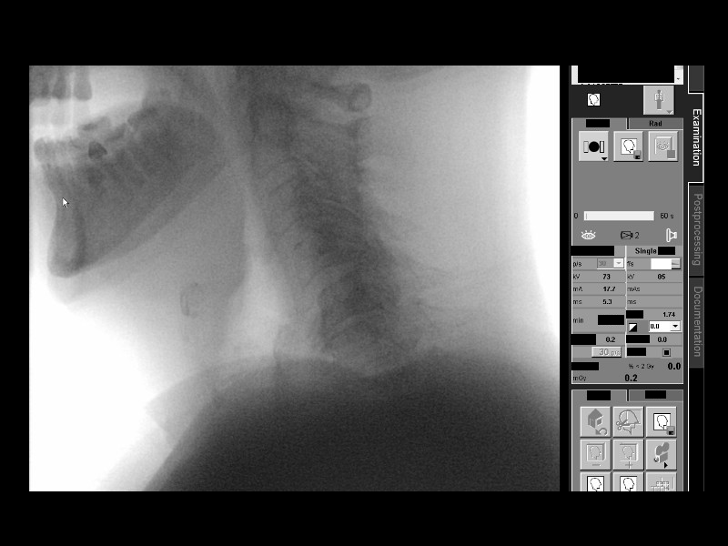
[im 7/31]
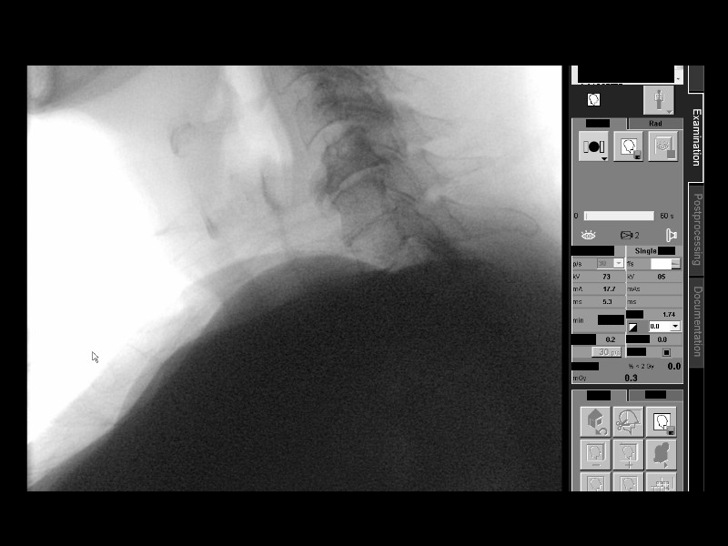
[im 10/31]
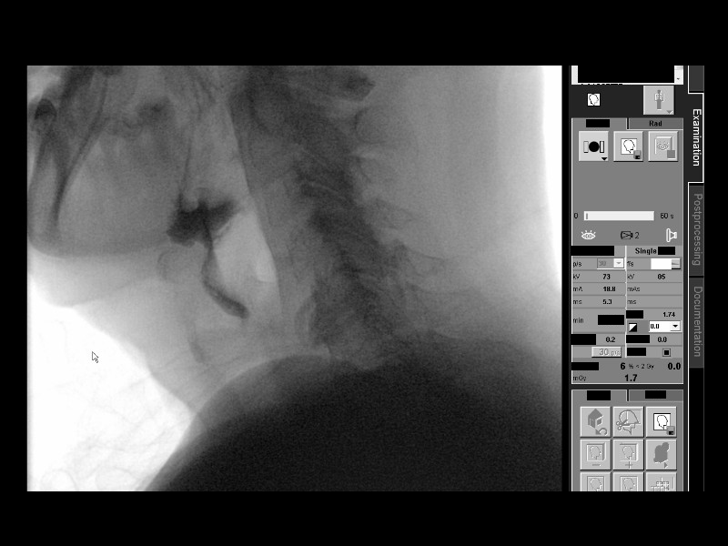
[im 11/31]
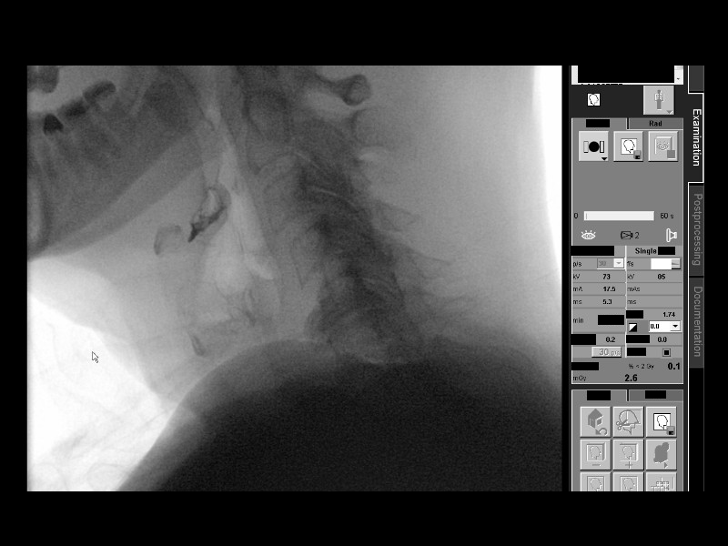
[im 14/31]
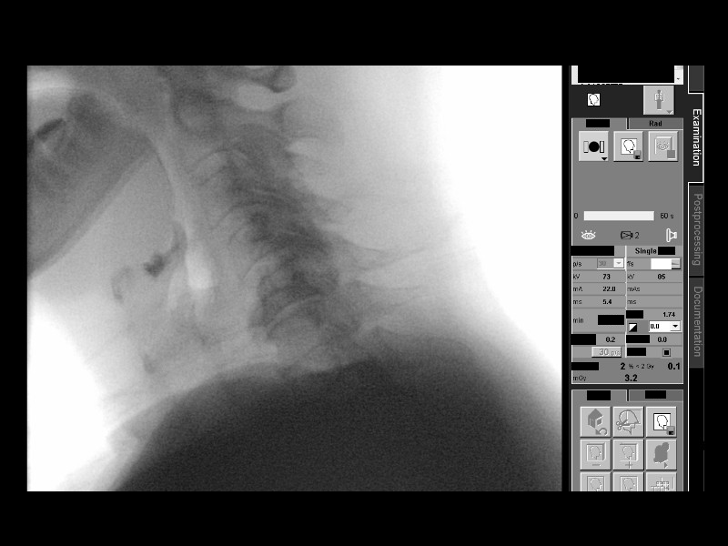
[im 17/31]
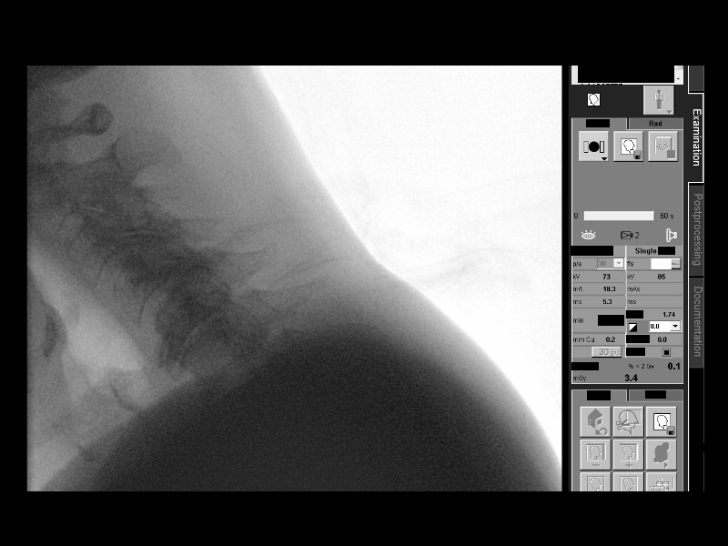
[im 19/31]
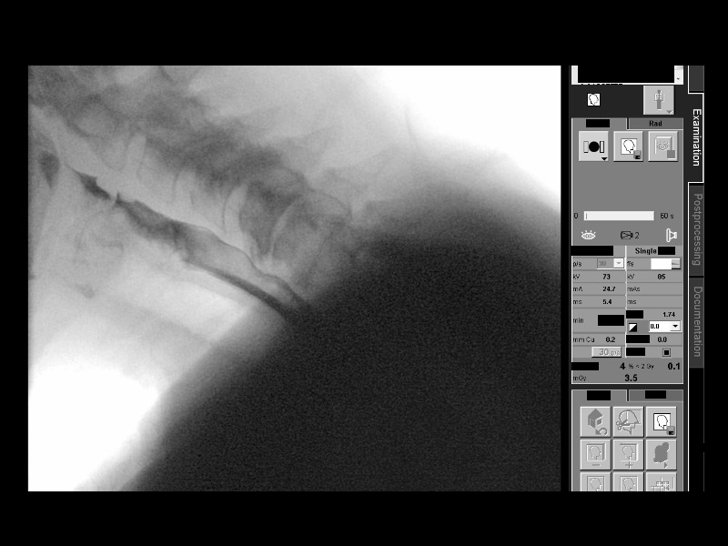
[im 22/31]
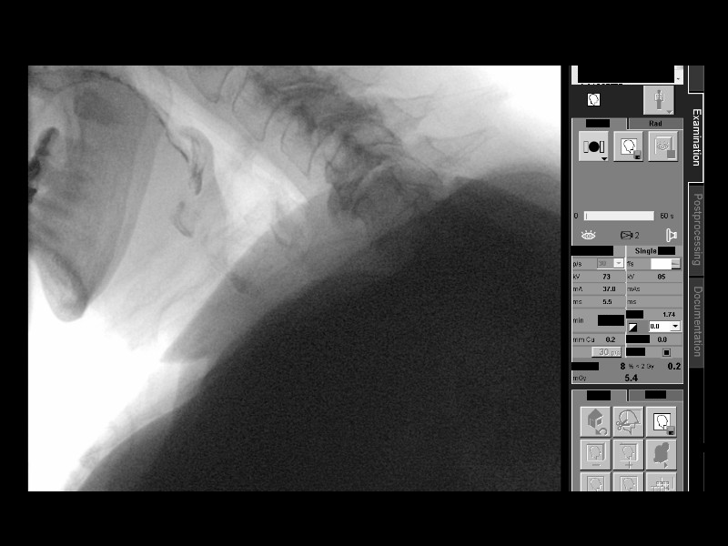
[im 25/31]
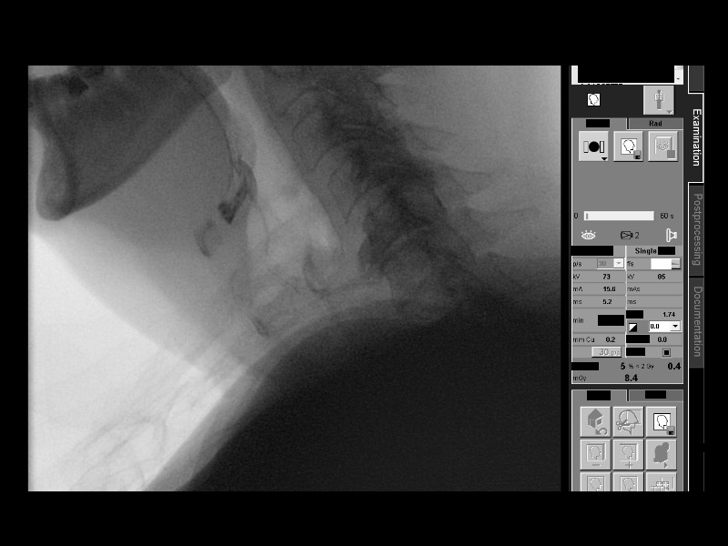
[im 28/31]
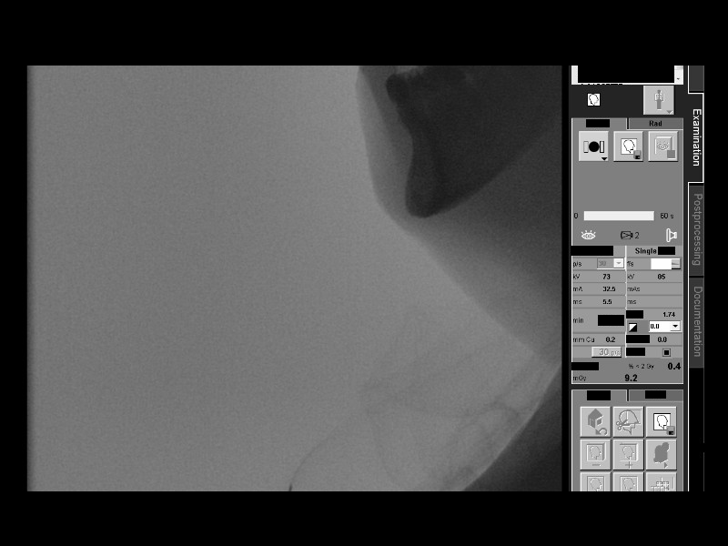
[im 29/31]
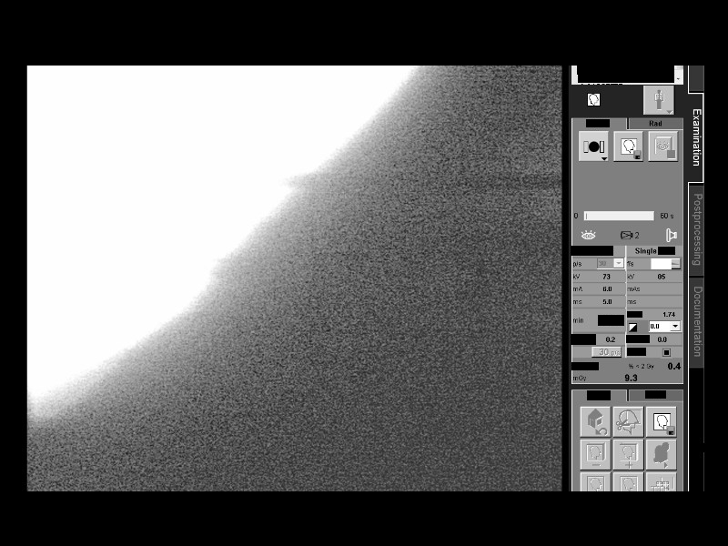

[Series 2: run · 4 acquisitions, 2 frames shown (2 of 2)]
[im 1/4]
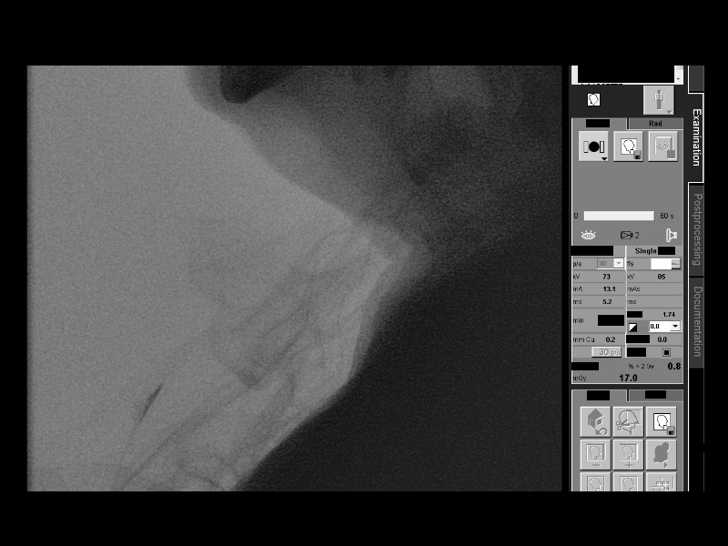
[im 4/4]
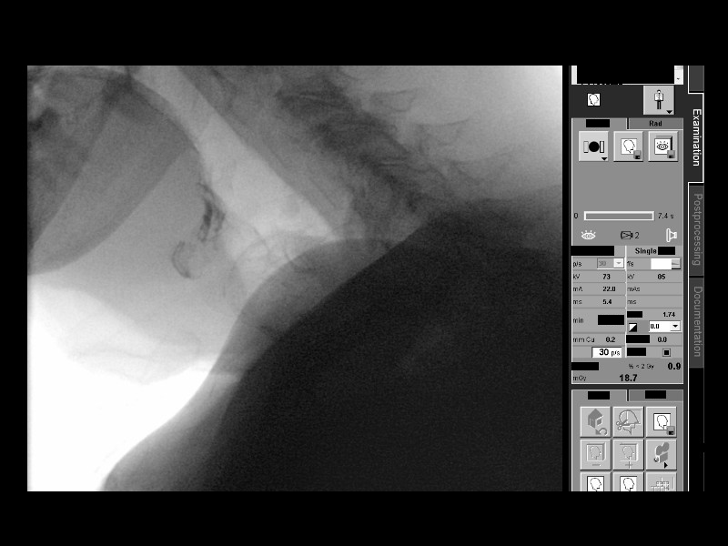

[14 of 24 positions shown; findings below may reference images not displayed]

Canned report from images found in remote index.

Refer to host system for actual result text.

## 2016-10-28 ENCOUNTER — Telehealth (INDEPENDENT_AMBULATORY_CARE_PROVIDER_SITE_OTHER): Payer: Self-pay | Admitting: Orthopaedic Surgery

## 2016-10-28 NOTE — Telephone Encounter (Signed)
Patient had injection in back two weeks ago. Per patient injection has helped with pain with back, and hip. Numbness in rt LE is still presistant, but per patient may be due to Diabetes.

## 2016-10-29 NOTE — Telephone Encounter (Signed)
Please advise 

## 2016-10-29 NOTE — Telephone Encounter (Signed)
She will need to continue to watch that. If she starts to develop weakness and we need to see her quickly.

## 2016-10-30 NOTE — Telephone Encounter (Signed)
Tried to call pt, no answer, no machine.

## 2016-11-16 ENCOUNTER — Encounter (INDEPENDENT_AMBULATORY_CARE_PROVIDER_SITE_OTHER): Payer: Self-pay | Admitting: Orthopaedic Surgery

## 2016-11-16 ENCOUNTER — Ambulatory Visit (INDEPENDENT_AMBULATORY_CARE_PROVIDER_SITE_OTHER): Payer: Medicare Other | Admitting: Orthopaedic Surgery

## 2016-11-16 VITALS — BP 121/71 | HR 85 | Resp 4 | Ht 66.0 in | Wt 150.0 lb

## 2016-11-16 DIAGNOSIS — Z6824 Body mass index (BMI) 24.0-24.9, adult: Secondary | ICD-10-CM | POA: Diagnosis not present

## 2016-11-16 DIAGNOSIS — R1031 Right lower quadrant pain: Secondary | ICD-10-CM | POA: Diagnosis not present

## 2016-11-16 DIAGNOSIS — M5380 Other specified dorsopathies, site unspecified: Secondary | ICD-10-CM | POA: Diagnosis not present

## 2016-11-16 DIAGNOSIS — M5441 Lumbago with sciatica, right side: Secondary | ICD-10-CM

## 2016-11-16 DIAGNOSIS — G8929 Other chronic pain: Secondary | ICD-10-CM | POA: Diagnosis not present

## 2016-11-16 NOTE — Progress Notes (Signed)
Office Visit Note   Patient: Amanda Cochran           Date of Birth: 07-08-1928           MRN: [id number] Visit Date: 11/16/2016              Requested by: Crist Infante, MD [address] [place], [place] [address] PCP: Crist Infante, MD   Assessment & Plan: Visit Diagnoses:  1. Chronic bilateral low back pain with right-sided sciatica     Plan:decreased low back pain and discomfort in  right lower extremity since epidural steroid injection.he is experiencing some right lower quadrant discomfort. She will check with Dr. Joylene Draft. See back on a when necessary basis for consideration of repeat injection  Follow-Up Instructions: Return if symptoms worsen or fail to improve.   Orders:  No orders of the defined types were placed in this encounter.  No orders of the defined types were placed in this encounter.     Procedures: No procedures performed   Clinical Data: No additional findings.   Subjective: Chief Complaint  Patient presents with  . Lower Back - Pain    Amanda Cochran relates that her symptoms are essentially unchanged. She does have chronic back pain associated bilateral lower extremity discomfort. She's been having more trouble on the right than the left. ESI by Dr. [name] gave her 2 weeks of relief. Groin  injection actually performed at [facility] imaging. Amanda Cochran relates that she's having less back pain and less pain in her right lower extremity including less numbness and tingling. She has been experiencing some right lower quadrant abdominal pain. Some nausea which is unchanged from the past. Does have history of a spastic colon.I've asked her to check with Dr. Silvestre Mesi office as I'm not sure that this is related to her back or her injection. No fever or chills.  HPI  Review of Systems  Constitutional: Negative for chills, fatigue and fever.  Eyes: Negative for itching.  Respiratory: Negative for chest tightness and shortness of breath.   Cardiovascular: Negative  for chest pain, palpitations and leg swelling.  Gastrointestinal: Negative for blood in stool, constipation and diarrhea.  Endocrine: Negative for polyuria.  Genitourinary: Negative for dysuria.  Musculoskeletal: Positive for back pain and joint swelling. Negative for neck pain and neck stiffness.  Allergic/Immunologic: Negative for immunocompromised state.  Neurological: Positive for light-headedness. Negative for dizziness and numbness.  Hematological: Does not bruise/bleed easily.  Psychiatric/Behavioral: The patient is not nervous/anxious.      Objective: Vital Signs: BP 121/71   Pulse 85   Resp (!) 4   Ht 5\' 6"  (1.676 m)   Wt 150 lb (68 kg)   BMI 24.21 kg/m   Physical Exam  Ortho Exam[place]ake alert and oriented 3. Without shortness of breath or chest pain.Straight leg raise negative bilaterally. Painless range of motion of both hips. No obvious swelling to either lower extremity. No knee pain. No percussible tenderness of lumbar spine. Does have some discomfort in the right lower quadrant of her abdomen without rebound. No groin pain. Motor exam appears to be intact. Subjectively she has less numbness in her right foot  Specialty Comments:  No specialty comments available.  Imaging: No results found.   PMFS History: Patient Active Problem List   Diagnosis Date Noted  . Left sided lacunar stroke ([place]) 02/14/2014  . Right hemiparesis ([place]) 02/09/2014  . CVA (cerebral infarction) 02/07/2014  . Hyperlipidemia   . Essential hypertension 02/03/2014   Past  Medical History:  Diagnosis Date  . Arthritis   . Bladder infection   . Glaucoma   . Glaucoma   . Glaucoma (increased eye pressure)   . Hyperlipidemia   . Hypertension   . Stroke ([place])   . Thyroid disease     History reviewed. No pertinent family history.  Past Surgical History:  Procedure Laterality Date  . cataract surgery Bilateral 2015  . THYROID SURGERY  1976  . TONSILLECTOMY AND ADENOIDECTOMY    .  VAGINAL PROLAPSE REPAIR     Social History   Occupational History  . retired     Glass blower/designer   Social History Main Topics  . Smoking status: Never Smoker  . Smokeless tobacco: Never Used  . Alcohol use No  . Drug use: No  . Sexual activity: Not on file

## 2016-12-01 ENCOUNTER — Other Ambulatory Visit (INDEPENDENT_AMBULATORY_CARE_PROVIDER_SITE_OTHER): Payer: Self-pay

## 2016-12-01 ENCOUNTER — Telehealth (INDEPENDENT_AMBULATORY_CARE_PROVIDER_SITE_OTHER): Payer: Self-pay | Admitting: Orthopaedic Surgery

## 2016-12-01 DIAGNOSIS — M5440 Lumbago with sciatica, unspecified side: Secondary | ICD-10-CM

## 2016-12-01 NOTE — Telephone Encounter (Signed)
Patient calling in reference to back pain. Patient would like to know if doctor has spoken with Dr. Joylene Draft about patients MRI scan, and findings. Patient still having a lot of pain, and wants to know what she can do. Please call to advise.

## 2016-12-01 NOTE — Telephone Encounter (Signed)
Please contact Dr Romona Curls office to call Amanda Cochran for another ESI--she has difficulty with her eyesight and cannot find his number

## 2016-12-01 NOTE — Telephone Encounter (Signed)
Please advise 

## 2016-12-01 NOTE — Telephone Encounter (Signed)
Sent referral 

## 2016-12-16 DIAGNOSIS — H401133 Primary open-angle glaucoma, bilateral, severe stage: Secondary | ICD-10-CM | POA: Diagnosis not present

## 2016-12-17 ENCOUNTER — Encounter (INDEPENDENT_AMBULATORY_CARE_PROVIDER_SITE_OTHER): Payer: Self-pay | Admitting: Physical Medicine and Rehabilitation

## 2016-12-17 ENCOUNTER — Ambulatory Visit (INDEPENDENT_AMBULATORY_CARE_PROVIDER_SITE_OTHER): Payer: Medicare Other

## 2016-12-17 ENCOUNTER — Ambulatory Visit (INDEPENDENT_AMBULATORY_CARE_PROVIDER_SITE_OTHER): Payer: Medicare Other | Admitting: Physical Medicine and Rehabilitation

## 2016-12-17 VITALS — BP 135/76 | HR 108

## 2016-12-17 DIAGNOSIS — G8929 Other chronic pain: Secondary | ICD-10-CM

## 2016-12-17 DIAGNOSIS — M545 Low back pain: Secondary | ICD-10-CM

## 2016-12-17 DIAGNOSIS — M47816 Spondylosis without myelopathy or radiculopathy, lumbar region: Secondary | ICD-10-CM | POA: Diagnosis not present

## 2016-12-17 DIAGNOSIS — M48061 Spinal stenosis, lumbar region without neurogenic claudication: Secondary | ICD-10-CM

## 2016-12-17 MED ORDER — LIDOCAINE HCL (PF) 1 % IJ SOLN: 2.0000 mL | Freq: Once | INTRAMUSCULAR | Status: DC

## 2016-12-17 MED ORDER — METHYLPREDNISOLONE ACETATE 80 MG/ML IJ SUSP
80.0000 mg | Freq: Once | INTRAMUSCULAR | Status: AC
  Administered 2016-12-17: 80 mg

## 2016-12-17 NOTE — Patient Instructions (Signed)

## 2016-12-17 NOTE — Progress Notes (Signed)
Chronic Bilateral Low Back Pain

## 2016-12-28 NOTE — Procedures (Signed)
[name] is an 83 year old female with chronic worsening severe axial right-sided much more than left low back pain.  She has been followed by Dr. Durward Fortes who requested interventional spine procedure.  Quick evaluation of her MRI which is recent shows multilevel facet arthropathy with a right-sided facet joint cyst at L3-4 listhesis.  There was moderate stenosis at L3-4.  She is not really having radicular leg pain.  We are going to complete right-sided facet joint blocks to see if she gets good relief.  If she does not we would look at potentially epidural injection.  The injection  will be diagnostic and hopefully therapeutic. The patient has failed conservative care including time, medications and activity modification.  Lumbar Facet Joint Intra-Articular Injection(s) with Fluoroscopic Guidance  Patient: Amanda Cochran      Date of Birth: 1929-01-26 MRN: [id number] PCP: Crist Infante, MD      Visit Date: 12/17/2016   Universal Protocol:    Date/Time: 12/17/2016  Consent Given By: the patient  Position: PRONE   Additional Comments: Vital signs were monitored before and after the procedure. Patient was prepped and draped in the usual sterile fashion. The correct patient, procedure, and site was verified.   Injection Procedure Details:  Procedure Site One Meds Administered:  Meds ordered this encounter  Medications  . lidocaine (PF) (XYLOCAINE) 1 % injection 2 mL  . methylPREDNISolone acetate (DEPO-MEDROL) injection 80 mg     Laterality: Right  Location/Site:  L3-L4 L4-L5 L5-S1  Needle size: 22 guage  Needle type: Spinal  Needle Placement: Articular  Findings:  -Contrast Used: 1 mL iohexol 180 mg iodine/mL   -Comments: Excellent flow of contrast producing a partial arthrogram.  Procedure Details: The fluoroscope beam is vertically oriented in AP, and the inferior recess is visualized beneath the lower pole of the inferior apophyseal process, which represents the target  point for needle insertion. When direct visualization is difficult the target point is located at the medial projection of the vertebral pedicle. The region overlying each aforementioned target is locally anesthetized with a 1 to 2 ml. volume of 1% Lidocaine without Epinephrine.   The spinal needle was inserted into each of the above mentioned facet joints using biplanar fluoroscopic guidance. A 0.25 to 0.5 ml. volume of Isovue-250 was injected and a partial facet joint arthrogram was obtained. A single spot film was obtained of the resulting arthrogram.    One to 1.25 ml of the steroid/anesthetic solution was then injected into each of the facet joints noted above.   Additional Comments:  The patient tolerated the procedure well Dressing: Band-Aid    Post-procedure details: Patient was observed during the procedure. Post-procedure instructions were reviewed.  Patient left the clinic in stable condition.

## 2017-01-28 DIAGNOSIS — F3289 Other specified depressive episodes: Secondary | ICD-10-CM | POA: Diagnosis not present

## 2017-01-28 DIAGNOSIS — I4891 Unspecified atrial fibrillation: Secondary | ICD-10-CM | POA: Diagnosis not present

## 2017-01-28 DIAGNOSIS — E038 Other specified hypothyroidism: Secondary | ICD-10-CM | POA: Diagnosis not present

## 2017-01-28 DIAGNOSIS — M5380 Other specified dorsopathies, site unspecified: Secondary | ICD-10-CM | POA: Diagnosis not present

## 2017-01-28 DIAGNOSIS — I69959 Hemiplegia and hemiparesis following unspecified cerebrovascular disease affecting unspecified side: Secondary | ICD-10-CM | POA: Diagnosis not present

## 2017-01-28 DIAGNOSIS — I10 Essential (primary) hypertension: Secondary | ICD-10-CM | POA: Diagnosis not present

## 2017-01-28 DIAGNOSIS — Z23 Encounter for immunization: Secondary | ICD-10-CM | POA: Diagnosis not present

## 2017-01-28 DIAGNOSIS — R7301 Impaired fasting glucose: Secondary | ICD-10-CM | POA: Diagnosis not present

## 2017-01-28 DIAGNOSIS — I498 Other specified cardiac arrhythmias: Secondary | ICD-10-CM | POA: Diagnosis not present

## 2017-01-28 DIAGNOSIS — Z6823 Body mass index (BMI) 23.0-23.9, adult: Secondary | ICD-10-CM | POA: Diagnosis not present

## 2017-01-29 ENCOUNTER — Telehealth: Payer: Self-pay

## 2017-01-29 NOTE — Telephone Encounter (Signed)
Sent notes to scheduling 

## 2017-02-17 ENCOUNTER — Encounter: Payer: Self-pay | Admitting: *Deleted

## 2017-03-05 ENCOUNTER — Encounter (INDEPENDENT_AMBULATORY_CARE_PROVIDER_SITE_OTHER): Payer: Self-pay

## 2017-03-05 ENCOUNTER — Encounter: Payer: Self-pay | Admitting: Cardiovascular Disease

## 2017-03-05 ENCOUNTER — Ambulatory Visit (INDEPENDENT_AMBULATORY_CARE_PROVIDER_SITE_OTHER): Payer: Medicare Other | Admitting: Cardiovascular Disease

## 2017-03-05 VITALS — BP 107/70 | HR 93 | Ht 66.0 in | Wt 145.8 lb

## 2017-03-05 DIAGNOSIS — I481 Persistent atrial fibrillation: Secondary | ICD-10-CM | POA: Diagnosis not present

## 2017-03-05 DIAGNOSIS — I10 Essential (primary) hypertension: Secondary | ICD-10-CM

## 2017-03-05 DIAGNOSIS — I4819 Other persistent atrial fibrillation: Secondary | ICD-10-CM

## 2017-03-05 NOTE — Patient Instructions (Signed)

## 2017-03-05 NOTE — Progress Notes (Signed)
Cardiology Office Note:    Date:  03/05/2017   ID:  Amanda Cochran, DOB 1928-03-23, MRN [id number]  PCP:  Crist Infante, MD  Cardiologist:  Acie Fredrickson   Referring MD: Crist Infante, MD   Problem list 1.  Atrial fibrillation 2.  Hypertension 3.  Hypothyroidism 4.  Chronic diastolic CHF  5.  Glaucoma - is almost blind  6.  CVA  - 02/03/14   Chief Complaint  Patient presents with  . Atrial Fibrillation   History of Present Illness:    Amanda Cochran is a 84 y.o. female with a hx of  Atrial fib. I saw her many years ago  Referred back by Dr. Joylene Draft   Was found recently to have atrial fib.   Is on Eliquis  Has had some chest discomfort - thinks it may have been anxiety.  Woke up 3 times the other night with CP  Does not get any regular exercise    Past Medical History:  Diagnosis Date  . Arthritis   . Bladder infection   . CVA (cerebral infarction) 02/07/2014   Left basal ganglia infarct with right hemiparesis   . Glaucoma   . Glaucoma   . Glaucoma (increased eye pressure)   . Hyperlipidemia   . Hypertension   . Left sided lacunar stroke 02/14/2014  . Right hemiparesis ([place]) 02/09/2014   Left basal ganglia infarct Dec 2015   . Stroke ([place])   . Thyroid disease     Past Surgical History:  Procedure Laterality Date  . cataract surgery Bilateral 2015  . THYROID SURGERY  1976  . TONSILLECTOMY AND ADENOIDECTOMY    . VAGINAL PROLAPSE REPAIR      Current Medications: Current Meds  Medication Sig  . amLODipine (NORVASC) 10 MG tablet Take 1 tablet (10 mg total) by mouth daily.  Marland Kitchen apixaban (ELIQUIS) 5 MG TABS tablet Take 5 mg by mouth 2 (two) times daily.  . benazepril (LOTENSIN) 40 MG tablet Take 1 tablet (40 mg total) by mouth daily.  . Cholecalciferol (VITAMIN D) 2000 UNITS CAPS Take 1 capsule by mouth daily.  . dorzolamide-timolol (COSOPT) 22.3-6.8 MG/ML ophthalmic solution Place 1 drop into both eyes 2 (two) times daily.  . hydrALAZINE (APRESOLINE) 25 MG tablet Take 1  tablet (25 mg total) by mouth every 8 (eight) hours.  Marland Kitchen latanoprost (XALATAN) 0.005 % ophthalmic solution Place 1 drop into both eyes at bedtime.   Marland Kitchen levothyroxine (SYNTHROID, LEVOTHROID) 100 MCG tablet Take 1 tablet (100 mcg total) by mouth daily.  . Multiple Vitamins-Minerals (CENTRUM SILVER PO) Take 1 tablet by mouth daily.  . Omega-3 Fatty Acids (FISH OIL) 1000 MG CAPS Take 1,000 mg by mouth.  . pilocarpine (PILOCAR) 1 % ophthalmic solution   . ranitidine (ZANTAC) 15 MG/ML syrup Take by mouth 2 (two) times daily.  . simvastatin (ZOCOR) 40 MG tablet Take 40 mg by mouth every evening.  . traMADol (ULTRAM) 50 MG tablet Take by mouth every 8 (eight) hours as needed.  Marland Kitchen VYZULTA 0.024 % SOLN INT 1 GTT IN OU Q NIGHT   Current Facility-Administered Medications for the 03/05/17 encounter (Office Visit) with , Wonda Cheng, MD  Medication  . lidocaine (PF) (XYLOCAINE) 1 % injection 2 mL     Allergies:   Patient has no known allergies.   Social History   Socioeconomic History  . Marital status: Married    Spouse name: deceased  . Number of children: 0  . Years of education: 11  .  Highest education level: None  Social Needs  . Financial resource strain: None  . Food insecurity - worry: None  . Food insecurity - inability: None  . Transportation needs - medical: None  . Transportation needs - non-medical: None  Occupational History  . Occupation: retired    Comment: Glass blower/designer  Tobacco Use  . Smoking status: Never Smoker  . Smokeless tobacco: Never Used  Substance and Sexual Activity  . Alcohol use: No  . Drug use: No  . Sexual activity: None  Other Topics Concern  . None  Social History Narrative   Widowed, lives alone   Caffeine use- 1 cup coffee daily   Right handed   No children     Family History: The patient's family history includes AAA (abdominal aortic aneurysm) in her father; Heart failure in her mother.  ROS:   Please see the history of present illness.      All other systems reviewed and are negative.  EKGs/Labs/Other Studies Reviewed:    The following studies were reviewed today:  EKG:  Jan. 11, 2019:   Atrial fib at 35.  No ST or T wave changes.   Recent Labs: No results found for requested labs within last 8760 hours.  Recent Lipid Panel    Component Value Date/Time   CHOL 188 02/04/2014 0705   TRIG 258 (H) 02/04/2014 0705   HDL 50 02/04/2014 0705   CHOLHDL 3.8 02/04/2014 0705   VLDL 52 (H) 02/04/2014 0705   LDLCALC 86 02/04/2014 0705    Physical Exam:    VS:  BP 107/70   Pulse 93   Ht 5\' 6"  (1.676 m)   Wt 145 lb 12.8 oz (66.1 kg)   BMI 23.53 kg/m     Wt Readings from Last 3 Encounters:  03/05/17 145 lb 12.8 oz (66.1 kg)  11/16/16 150 lb (68 kg)  10/01/16 145 lb (65.8 kg)     GEN: Elderly female.  No acute distress. HEENT: Normal NECK: No JVD; No carotid bruits LYMPHATICS: No lymphadenopathy CARDIAC: Irregularly irregular. RESPIRATORY:  Clear to auscultation without rales, wheezing or rhonchi  ABDOMEN: Soft, non-tender, non-distended MUSCULOSKELETAL:  No edema; No deformity  SKIN: Warm and dry NEUROLOGIC:  Alert and oriented x 3 PSYCHIATRIC:  Normal affect   ASSESSMENT:    No diagnosis found. PLAN:    In order of problems listed above:  1. Atrial fibrillation: She now presents with persistent atrial fibrillation.   Her CHADS2 VASC score is 69   ( age 67, CVA, HTN, female )   2.  History of stroke: The patient was on aggrinox.  Stroke was likely due to paroxysmal A. fib.  She is now on Eliquis 5 mg twice a day.    Medication Adjustments/Labs and Tests Ordered: Current medicines are reviewed at length with the patient today.  Concerns regarding medicines are outlined above.  No orders of the defined types were placed in this encounter.  No orders of the defined types were placed in this encounter.   Signed, Mertie Moores, MD  03/05/2017 10:44 AM    [place]

## 2017-03-18 DIAGNOSIS — H401133 Primary open-angle glaucoma, bilateral, severe stage: Secondary | ICD-10-CM | POA: Diagnosis not present

## 2017-05-27 DIAGNOSIS — Z6823 Body mass index (BMI) 23.0-23.9, adult: Secondary | ICD-10-CM | POA: Diagnosis not present

## 2017-05-27 DIAGNOSIS — E7849 Other hyperlipidemia: Secondary | ICD-10-CM | POA: Diagnosis not present

## 2017-05-27 DIAGNOSIS — R5383 Other fatigue: Secondary | ICD-10-CM | POA: Diagnosis not present

## 2017-05-27 DIAGNOSIS — I10 Essential (primary) hypertension: Secondary | ICD-10-CM | POA: Diagnosis not present

## 2017-05-27 DIAGNOSIS — I4891 Unspecified atrial fibrillation: Secondary | ICD-10-CM | POA: Diagnosis not present

## 2017-05-27 DIAGNOSIS — I69959 Hemiplegia and hemiparesis following unspecified cerebrovascular disease affecting unspecified side: Secondary | ICD-10-CM | POA: Diagnosis not present

## 2017-05-27 DIAGNOSIS — F329 Major depressive disorder, single episode, unspecified: Secondary | ICD-10-CM | POA: Diagnosis not present

## 2017-05-27 DIAGNOSIS — E039 Hypothyroidism, unspecified: Secondary | ICD-10-CM | POA: Diagnosis not present

## 2017-05-27 DIAGNOSIS — R7301 Impaired fasting glucose: Secondary | ICD-10-CM | POA: Diagnosis not present

## 2017-06-16 DIAGNOSIS — H401133 Primary open-angle glaucoma, bilateral, severe stage: Secondary | ICD-10-CM | POA: Diagnosis not present

## 2017-07-05 DIAGNOSIS — H401133 Primary open-angle glaucoma, bilateral, severe stage: Secondary | ICD-10-CM | POA: Diagnosis not present

## 2017-07-05 DIAGNOSIS — R443 Hallucinations, unspecified: Secondary | ICD-10-CM | POA: Diagnosis not present

## 2017-07-05 DIAGNOSIS — H353132 Nonexudative age-related macular degeneration, bilateral, intermediate dry stage: Secondary | ICD-10-CM | POA: Diagnosis not present

## 2017-07-05 DIAGNOSIS — H4720 Unspecified optic atrophy: Secondary | ICD-10-CM | POA: Diagnosis not present

## 2017-08-19 DIAGNOSIS — H401133 Primary open-angle glaucoma, bilateral, severe stage: Secondary | ICD-10-CM | POA: Diagnosis not present

## 2017-09-15 DIAGNOSIS — H401133 Primary open-angle glaucoma, bilateral, severe stage: Secondary | ICD-10-CM | POA: Diagnosis not present

## 2017-09-17 DIAGNOSIS — E559 Vitamin D deficiency, unspecified: Secondary | ICD-10-CM | POA: Diagnosis not present

## 2017-09-17 DIAGNOSIS — R7301 Impaired fasting glucose: Secondary | ICD-10-CM | POA: Diagnosis not present

## 2017-09-17 DIAGNOSIS — R82998 Other abnormal findings in urine: Secondary | ICD-10-CM | POA: Diagnosis not present

## 2017-09-17 DIAGNOSIS — I10 Essential (primary) hypertension: Secondary | ICD-10-CM | POA: Diagnosis not present

## 2017-09-17 DIAGNOSIS — E7849 Other hyperlipidemia: Secondary | ICD-10-CM | POA: Diagnosis not present

## 2017-09-17 DIAGNOSIS — E038 Other specified hypothyroidism: Secondary | ICD-10-CM | POA: Diagnosis not present

## 2017-09-20 DIAGNOSIS — H401133 Primary open-angle glaucoma, bilateral, severe stage: Secondary | ICD-10-CM | POA: Diagnosis not present

## 2017-09-24 DIAGNOSIS — I6389 Other cerebral infarction: Secondary | ICD-10-CM | POA: Diagnosis not present

## 2017-09-24 DIAGNOSIS — Z6823 Body mass index (BMI) 23.0-23.9, adult: Secondary | ICD-10-CM | POA: Diagnosis not present

## 2017-09-24 DIAGNOSIS — Z0000 Encounter for general adult medical examination without abnormal findings: Secondary | ICD-10-CM | POA: Diagnosis not present

## 2017-09-24 DIAGNOSIS — I4891 Unspecified atrial fibrillation: Secondary | ICD-10-CM | POA: Diagnosis not present

## 2017-09-24 DIAGNOSIS — H4089 Other specified glaucoma: Secondary | ICD-10-CM | POA: Diagnosis not present

## 2017-09-24 DIAGNOSIS — N390 Urinary tract infection, site not specified: Secondary | ICD-10-CM | POA: Diagnosis not present

## 2017-09-24 DIAGNOSIS — I69959 Hemiplegia and hemiparesis following unspecified cerebrovascular disease affecting unspecified side: Secondary | ICD-10-CM | POA: Diagnosis not present

## 2017-09-24 DIAGNOSIS — I499 Cardiac arrhythmia, unspecified: Secondary | ICD-10-CM | POA: Diagnosis not present

## 2017-09-24 DIAGNOSIS — H8113 Benign paroxysmal vertigo, bilateral: Secondary | ICD-10-CM | POA: Diagnosis not present

## 2017-09-24 DIAGNOSIS — G458 Other transient cerebral ischemic attacks and related syndromes: Secondary | ICD-10-CM | POA: Diagnosis not present

## 2017-09-24 DIAGNOSIS — Z1389 Encounter for screening for other disorder: Secondary | ICD-10-CM | POA: Diagnosis not present

## 2017-09-24 DIAGNOSIS — R5383 Other fatigue: Secondary | ICD-10-CM | POA: Diagnosis not present

## 2017-10-11 DIAGNOSIS — H401133 Primary open-angle glaucoma, bilateral, severe stage: Secondary | ICD-10-CM | POA: Diagnosis not present

## 2017-10-21 ENCOUNTER — Ambulatory Visit (INDEPENDENT_AMBULATORY_CARE_PROVIDER_SITE_OTHER): Payer: Medicare Other | Admitting: Cardiovascular Disease

## 2017-10-21 ENCOUNTER — Encounter: Payer: Self-pay | Admitting: Cardiovascular Disease

## 2017-10-21 VITALS — BP 142/72 | HR 68 | Ht 66.0 in | Wt 146.8 lb

## 2017-10-21 DIAGNOSIS — I482 Chronic atrial fibrillation, unspecified: Secondary | ICD-10-CM

## 2017-10-21 DIAGNOSIS — E782 Mixed hyperlipidemia: Secondary | ICD-10-CM | POA: Diagnosis not present

## 2017-10-21 NOTE — Progress Notes (Signed)
Cardiology Office Note:    Date:  10/21/2017   ID:  Amanda Cochran, DOB 1928/11/04, MRN [id number]  PCP:  Crist Infante, MD  Cardiologist:  Acie Fredrickson   Referring MD: Crist Infante, MD   Problem list 1.  Atrial fibrillation 2.  Hypertension 3.  Hypothyroidism 4.  Chronic diastolic CHF  5.  Glaucoma - is almost blind  6.  CVA  - 02/03/14   Chief Complaint  Patient presents with  . Atrial Fibrillation      Amanda Cochran is a 84 y.o. female with a hx of  Atrial fib. I saw her many years ago  Referred back by Dr. Joylene Draft   Was found recently to have atrial fib.   Is on Eliquis  Has had some chest discomfort - thinks it may have been anxiety.  Woke up 3 times the other night with CP  Does not get any regular exercise   Aug. 29, 2019  Doing ok No CP or dyspne Fatigues easily .  Has lots of vision issues.  Unable to read  Uses a magnifying glass     Past Medical History:  Diagnosis Date  . Arthritis   . Bladder infection   . CVA (cerebral infarction) 02/07/2014   Left basal ganglia infarct with right hemiparesis   . Glaucoma   . Glaucoma   . Glaucoma (increased eye pressure)   . Hyperlipidemia   . Hypertension   . Left sided lacunar stroke ([place]) 02/14/2014  . Right hemiparesis ([place]) 02/09/2014   Left basal ganglia infarct Dec 2015   . Stroke ([place])   . Thyroid disease     Past Surgical History:  Procedure Laterality Date  . cataract surgery Bilateral 2015  . THYROID SURGERY  1976  . TONSILLECTOMY AND ADENOIDECTOMY    . VAGINAL PROLAPSE REPAIR      Current Medications: Current Meds  Medication Sig  . amLODipine (NORVASC) 10 MG tablet Take 5 mg by mouth daily.  Marland Kitchen apixaban (ELIQUIS) 5 MG TABS tablet Take 5 mg by mouth 2 (two) times daily.  . benazepril (LOTENSIN) 20 MG tablet Take 20 mg by mouth daily.  . Cholecalciferol (VITAMIN D) 2000 UNITS CAPS Take 1 capsule by mouth daily.  . dorzolamide-timolol (COSOPT) 22.3-6.8 MG/ML ophthalmic solution Place 1 drop into  both eyes 2 (two) times daily.  . hydrALAZINE (APRESOLINE) 25 MG tablet Take 1 tablet (25 mg total) by mouth every 8 (eight) hours.  Marland Kitchen latanoprost (XALATAN) 0.005 % ophthalmic solution Place 1 drop into both eyes at bedtime.   Marland Kitchen levothyroxine (SYNTHROID, LEVOTHROID) 100 MCG tablet Take 1 tablet (100 mcg total) by mouth daily.  . Multiple Vitamins-Minerals (CENTRUM SILVER PO) Take 1 tablet by mouth daily.  . Omega-3 Fatty Acids (FISH OIL) 1000 MG CAPS Take 1,000 mg by mouth.  . pilocarpine (PILOCAR) 1 % ophthalmic solution   . ranitidine (ZANTAC) 15 MG/ML syrup Take by mouth 2 (two) times daily.  . simvastatin (ZOCOR) 40 MG tablet Take 40 mg by mouth every evening.  . traMADol (ULTRAM) 50 MG tablet Take by mouth every 8 (eight) hours as needed.  Marland Kitchen VYZULTA 0.024 % SOLN INT 1 GTT IN OU Q NIGHT   Current Facility-Administered Medications for the 10/21/17 encounter (Office Visit) with , Wonda Cheng, MD  Medication  . lidocaine (PF) (XYLOCAINE) 1 % injection 2 mL     Allergies:   Patient has no known allergies.   Social History   Socioeconomic History  . Marital  status: Married    Spouse name: deceased  . Number of children: 0  . Years of education: 51  . Highest education level: Not on file  Occupational History  . Occupation: retired    Comment: Glass blower/designer  Social Needs  . Financial resource strain: Not on file  . Food insecurity:    Worry: Not on file    Inability: Not on file  . Transportation needs:    Medical: Not on file    Non-medical: Not on file  Tobacco Use  . Smoking status: Never Smoker  . Smokeless tobacco: Never Used  Substance and Sexual Activity  . Alcohol use: No  . Drug use: No  . Sexual activity: Not on file  Lifestyle  . Physical activity:    Days per week: Not on file    Minutes per session: Not on file  . Stress: Not on file  Relationships  . Social connections:    Talks on phone: Not on file    Gets together: Not on file    Attends  religious service: Not on file    Active member of club or organization: Not on file    Attends meetings of clubs or organizations: Not on file    Relationship status: Not on file  Other Topics Concern  . Not on file  Social History Narrative   Widowed, lives alone   Caffeine use- 1 cup coffee daily   Right handed   No children     Family History: The patient's family history includes AAA (abdominal aortic aneurysm) in her father; Heart failure in her mother.  ROS:   Please see the history of present illness.     All other systems reviewed and are negative.  EKGs/Labs/Other Studies Reviewed:    The following studies were reviewed today:  EKG:  Jan. 11, 2019:   Atrial fib at 44.  No ST or T wave changes.   Recent Labs: No results found for requested labs within last 8760 hours.  Recent Lipid Panel    Component Value Date/Time   CHOL 188 02/04/2014 0705   TRIG 258 (H) 02/04/2014 0705   HDL 50 02/04/2014 0705   CHOLHDL 3.8 02/04/2014 0705   VLDL 52 (H) 02/04/2014 0705   LDLCALC 86 02/04/2014 0705   Physical Exam: Blood pressure (!) 142/72, pulse 68, height 5\' 6"  (1.676 m), weight 146 lb 12.8 oz (66.6 kg), SpO2 92 %.  GEN:  Elderly female,  NAD  HEENT: Normal NECK: No JVD; No carotid bruits LYMPHATICS: No lymphadenopathy CARDIAC: Irreg. Irreg.  RESPIRATORY:  Clear to auscultation without rales, wheezing or rhonchi  ABDOMEN: Soft, non-tender, non-distended MUSCULOSKELETAL:  No edema; No deformity  SKIN: Warm and dry NEUROLOGIC:  Alert and oriented x 3   ASSESSMENT:    No diagnosis found. PLAN:    In order of problems listed above:  1. Atrial fibrillation: She now presents with persistent atrial fibrillation.   Her CHADS2 VASC score is 78   ( age 18, CVA, HTN, female )  Is on Eliquis  Has had hematura and has passed clots in her urine Has lots of nose bleeds.  We discussed cutting the dose of the Eliquis to 2.5 BID but this would increase her risk of CVA .    It might reduce the nosebleeds.  Will continue same medications at this point but will consider reducing her dose if the bleeding continues to be a  problem   2.  History of stroke:  3.   Social obstacles. Is almost blind,  Unable to drive,   Is depressed, Cannot see to do anything.   No family around  I didn't have any answers for her     Medication Adjustments/Labs and Tests Ordered: Current medicines are reviewed at length with the patient today.  Concerns regarding medicines are outlined above.  No orders of the defined types were placed in this encounter.  No orders of the defined types were placed in this encounter.   Signed, Mertie Moores, MD  10/21/2017 11:27 AM    [place]

## 2017-10-21 NOTE — Patient Instructions (Signed)
Medication Instructions:  Your physician recommends that you continue on your current medications as directed. Please refer to the Current Medication list given to you today.   Labwork: None Ordered   Testing/Procedures: None Ordered   Follow-Up: Your physician wants you to follow-up in: 6 months with Dr. Nahser.  You will receive a reminder letter in the mail two months in advance. If you don't receive a letter, please call our office to schedule the follow-up appointment.   If you need a refill on your cardiac medications before your next appointment, please call your pharmacy.   Thank you for choosing [facility]!  , RN [phone]    

## 2017-11-18 DIAGNOSIS — H401133 Primary open-angle glaucoma, bilateral, severe stage: Secondary | ICD-10-CM | POA: Diagnosis not present

## 2018-01-25 DIAGNOSIS — I4891 Unspecified atrial fibrillation: Secondary | ICD-10-CM | POA: Diagnosis not present

## 2018-01-25 DIAGNOSIS — I69959 Hemiplegia and hemiparesis following unspecified cerebrovascular disease affecting unspecified side: Secondary | ICD-10-CM | POA: Diagnosis not present

## 2018-01-25 DIAGNOSIS — I10 Essential (primary) hypertension: Secondary | ICD-10-CM | POA: Diagnosis not present

## 2018-01-25 DIAGNOSIS — E038 Other specified hypothyroidism: Secondary | ICD-10-CM | POA: Diagnosis not present

## 2018-01-25 DIAGNOSIS — Z6823 Body mass index (BMI) 23.0-23.9, adult: Secondary | ICD-10-CM | POA: Diagnosis not present

## 2018-01-25 DIAGNOSIS — Z23 Encounter for immunization: Secondary | ICD-10-CM | POA: Diagnosis not present

## 2018-01-25 DIAGNOSIS — F3289 Other specified depressive episodes: Secondary | ICD-10-CM | POA: Diagnosis not present

## 2018-01-25 DIAGNOSIS — R7301 Impaired fasting glucose: Secondary | ICD-10-CM | POA: Diagnosis not present

## 2018-01-25 DIAGNOSIS — N390 Urinary tract infection, site not specified: Secondary | ICD-10-CM | POA: Diagnosis not present

## 2018-01-25 DIAGNOSIS — E7849 Other hyperlipidemia: Secondary | ICD-10-CM | POA: Diagnosis not present

## 2018-02-07 DIAGNOSIS — H401133 Primary open-angle glaucoma, bilateral, severe stage: Secondary | ICD-10-CM | POA: Diagnosis not present

## 2018-02-27 ENCOUNTER — Emergency Department (HOSPITAL_COMMUNITY): Payer: Medicare Other

## 2018-02-27 ENCOUNTER — Encounter (HOSPITAL_COMMUNITY): Payer: Self-pay | Admitting: Emergency Medicine

## 2018-02-27 ENCOUNTER — Emergency Department (HOSPITAL_COMMUNITY)
Admission: EM | Admit: 2018-02-27 | Discharge: 2018-02-27 | Disposition: A | Discharge status: Other Acute Inpatient Hospital | Payer: Medicare Other | Attending: Emergency Medicine | Admitting: Emergency Medicine

## 2018-02-27 ENCOUNTER — Other Ambulatory Visit: Payer: Self-pay

## 2018-02-27 DIAGNOSIS — I69351 Hemiplegia and hemiparesis following cerebral infarction affecting right dominant side: Secondary | ICD-10-CM | POA: Diagnosis not present

## 2018-02-27 DIAGNOSIS — H04322 Acute dacryocystitis of left lacrimal passage: Secondary | ICD-10-CM | POA: Diagnosis not present

## 2018-02-27 DIAGNOSIS — I10 Essential (primary) hypertension: Secondary | ICD-10-CM | POA: Insufficient documentation

## 2018-02-27 DIAGNOSIS — H401133 Primary open-angle glaucoma, bilateral, severe stage: Secondary | ICD-10-CM | POA: Insufficient documentation

## 2018-02-27 DIAGNOSIS — H04302 Unspecified dacryocystitis of left lacrimal passage: Secondary | ICD-10-CM

## 2018-02-27 DIAGNOSIS — H05012 Cellulitis of left orbit: Secondary | ICD-10-CM | POA: Diagnosis not present

## 2018-02-27 DIAGNOSIS — R269 Unspecified abnormalities of gait and mobility: Secondary | ICD-10-CM | POA: Diagnosis not present

## 2018-02-27 DIAGNOSIS — L03213 Periorbital cellulitis: Secondary | ICD-10-CM | POA: Diagnosis not present

## 2018-02-27 DIAGNOSIS — I5032 Chronic diastolic (congestive) heart failure: Secondary | ICD-10-CM | POA: Diagnosis not present

## 2018-02-27 DIAGNOSIS — I482 Chronic atrial fibrillation, unspecified: Secondary | ICD-10-CM | POA: Diagnosis not present

## 2018-02-27 DIAGNOSIS — H5712 Ocular pain, left eye: Secondary | ICD-10-CM | POA: Diagnosis present

## 2018-02-27 DIAGNOSIS — Z7901 Long term (current) use of anticoagulants: Secondary | ICD-10-CM | POA: Insufficient documentation

## 2018-02-27 DIAGNOSIS — Z79899 Other long term (current) drug therapy: Secondary | ICD-10-CM | POA: Diagnosis not present

## 2018-02-27 DIAGNOSIS — E039 Hypothyroidism, unspecified: Secondary | ICD-10-CM | POA: Insufficient documentation

## 2018-02-27 DIAGNOSIS — H0469 Other changes of lacrimal passages: Secondary | ICD-10-CM | POA: Diagnosis not present

## 2018-02-27 DIAGNOSIS — H05222 Edema of left orbit: Secondary | ICD-10-CM | POA: Diagnosis not present

## 2018-02-27 DIAGNOSIS — I110 Hypertensive heart disease with heart failure: Secondary | ICD-10-CM | POA: Diagnosis not present

## 2018-02-27 LAB — BASIC METABOLIC PANEL
Anion gap: 10 (ref 5–15)
BUN: 10 mg/dL (ref 8–23)
CALCIUM: 8.9 mg/dL (ref 8.9–10.3)
CO2: 23 mmol/L (ref 22–32)
CREATININE: 0.81 mg/dL (ref 0.44–1.00)
Chloride: 104 mmol/L (ref 98–111)
GFR calc non Af Amer: >60 mL/min (ref >60)
Glucose, Bld: 110 mg/dL — ABNORMAL HIGH (ref 70–99)
Potassium: 3.5 mmol/L (ref 3.5–5.1)
SODIUM: 137 mmol/L (ref 135–145)

## 2018-02-27 LAB — CBC WITH DIFFERENTIAL/PLATELET
Abs Immature Granulocytes: 0.06 10*3/uL (ref 0.00–0.07)
BASOS ABS: 0.1 10*3/uL (ref 0.0–0.1)
Basophils Relative: 0 %
EOS PCT: 0 %
Eosinophils Absolute: 0 10*3/uL (ref 0.0–0.5)
HEMATOCRIT: 42.7 % (ref 36.0–46.0)
Hemoglobin: 14 g/dL (ref 12.0–15.0)
Immature Granulocytes: 0 %
Lymphocytes Relative: 21 %
Lymphs Abs: 3.4 10*3/uL (ref 0.7–4.0)
MCH: 28.5 pg (ref 26.0–34.0)
MCHC: 32.8 g/dL (ref 30.0–36.0)
MCV: 86.8 fL (ref 80.0–100.0)
Monocytes Absolute: 1.3 10*3/uL — ABNORMAL HIGH (ref 0.1–1.0)
Monocytes Relative: 8 %
NRBC: 0 % (ref 0.0–0.2)
Neutro Abs: 11.4 10*3/uL — ABNORMAL HIGH (ref 1.7–7.7)
Neutrophils Relative %: 71 %
Platelets: 232 10*3/uL (ref 150–400)
RBC: 4.92 MIL/uL (ref 3.87–5.11)
RDW: 14.9 % (ref 11.5–15.5)
WBC: 16.2 10*3/uL — AB (ref 4.0–10.5)

## 2018-02-27 MED ORDER — IOHEXOL 300 MG/ML  SOLN
75.0000 mL | Freq: Once | INTRAMUSCULAR | Status: AC | PRN
  Administered 2018-02-27: 75 mL via INTRAVENOUS

## 2018-02-27 MED ORDER — VANCOMYCIN HCL IN DEXTROSE 1-5 GM/200ML-% IV SOLN
1000.0000 mg | Freq: Once | INTRAVENOUS | Status: AC
  Administered 2018-02-27: 1000 mg via INTRAVENOUS
  Filled 2018-02-27: qty 200

## 2018-02-27 NOTE — ED Notes (Signed)
Patient transported to CT 

## 2018-02-27 NOTE — ED Provider Notes (Addendum)
Amanda Cochran EMERGENCY DEPARTMENT Provider Note   CSN: [id number] Arrival date & time: 02/27/18  1414   History   Chief Complaint Chief Complaint  Patient presents with  . Eye Problem    HPI WALLACE COGLIANO is a 85 y.o. female. PMH of primary open angle glaucoma of both eyes- severe stage, A fib on eliquis, HTN, and hypothyroidism.     Patient reports that for the past week she has had some discomfort in her eye. She reports that she has been blind for months due to her glaucoma and can only see shadows. 2 days ago, she notes that her left eye started having more discharge and started to feel scratchy. She does not believe her vision changed acutely. She noted that it hurt for her eye to move around. She denies any burning of the eye. She does not feel as if this is due to pressure build up. Son in the room reports that her eye is much more inflamed and red today than it was yesterday. Patient had friend call her ophthalmologist Dr. Edilia Bo who recommended that she go to the ED at [facility], but she was unable to get there so he recommended coming to [facility] ED. Denies any fevers, chills, congestion, N, V but does endorse some dizziness.   The history is provided by the patient and a relative.    Past Medical History:  Diagnosis Date  . Arthritis   . Bladder infection   . CVA (cerebral infarction) 02/07/2014   Left basal ganglia infarct with right hemiparesis   . Glaucoma   . Glaucoma   . Glaucoma (increased eye pressure)   . Hyperlipidemia   . Hypertension   . Left sided lacunar stroke ([place]) 02/14/2014  . Right hemiparesis ([place]) 02/09/2014   Left basal ganglia infarct Dec 2015   . Stroke ([place])   . Thyroid disease     Patient Active Problem List   Diagnosis Date Noted  . Left sided lacunar stroke ([place]) 02/14/2014  . Right hemiparesis ([place]) 02/09/2014  . Cerebral infarction ([place]) 02/07/2014  . Hyperlipidemia   . Essential hypertension 02/03/2014    Past  Surgical History:  Procedure Laterality Date  . cataract surgery Bilateral 2015  . THYROID SURGERY  1976  . TONSILLECTOMY AND ADENOIDECTOMY    . VAGINAL PROLAPSE REPAIR       OB History   No obstetric history on file.      Home Medications    Prior to Admission medications   Medication Sig Start Date End Date Taking? Authorizing Provider  amLODipine (NORVASC) 10 MG tablet Take 5 mg by mouth daily.   Yes [provider]  apixaban (ELIQUIS) 5 MG TABS tablet Take 5 mg by mouth 2 (two) times daily.   Yes [provider]  benazepril (LOTENSIN) 20 MG tablet Take 20 mg by mouth daily.   Yes [provider]  Cholecalciferol (VITAMIN D) 2000 UNITS CAPS Take 1 capsule by mouth daily.   Yes [provider]  dorzolamide-timolol (COSOPT) 22.3-6.8 MG/ML ophthalmic solution Place 1 drop into both eyes 2 (two) times daily.   Yes [provider]  hydrALAZINE (APRESOLINE) 25 MG tablet Take 1 tablet (25 mg total) by mouth every 8 (eight) hours. 02/19/14  Yes Angiulli, Lavon Paganini, PA-C  latanoprost (XALATAN) 0.005 % ophthalmic solution Place 1 drop into both eyes at bedtime.    Yes [provider]  levothyroxine (SYNTHROID, LEVOTHROID) 100 MCG tablet Take 1 tablet (100  mcg total) by mouth daily. 02/19/14  Yes Angiulli, Lavon Paganini, PA-C  Multiple Vitamins-Minerals (CENTRUM SILVER PO) Take 1 tablet by mouth daily.   Yes [provider]  Omega-3 Fatty Acids (FISH OIL) 1000 MG CAPS Take 1,000 mg by mouth daily.    Yes [provider]  pilocarpine (PILOCAR) 1 % ophthalmic solution Place 1 drop into both eyes 4 (four) times daily.  08/06/16  Yes [provider]  simvastatin (ZOCOR) 40 MG tablet Take 40 mg by mouth every evening.   Yes [provider]  traMADol (ULTRAM) 50 MG tablet Take 50 mg by mouth every 8 (eight) hours as needed for moderate pain.    Yes [provider]    Family History Family History  Problem  Relation Age of Onset  . Heart failure Mother   . AAA (abdominal aortic aneurysm) Father     Social History Social History   Tobacco Use  . Smoking status: Never Smoker  . Smokeless tobacco: Never Used  Substance Use Topics  . Alcohol use: No  . Drug use: No     Allergies   Patient has no known allergies.   Review of Systems Review of Systems   Physical Exam Updated Vital Signs BP (!) 146/75   Pulse 96   Temp 97.9 F (36.6 C) (Oral)   Resp 20   SpO2 97%   Physical Exam Vitals signs and nursing note reviewed.  Constitutional:      Appearance: Normal appearance. She is not ill-appearing.  HENT:     Head: Normocephalic and atraumatic.     Nose: Nose normal. No congestion.     Mouth/Throat:     Mouth: Mucous membranes are moist.     Pharynx: Oropharynx is clear.  Eyes:     General:        Left eye: Discharge present.    Pupils: Pupils are equal, round, and reactive to light.     Comments: Left eye pictured below  Neck:     Musculoskeletal: Normal range of motion and neck supple.  Cardiovascular:     Rate and Rhythm: Normal rate. Rhythm irregular.     Heart sounds: No murmur.  Pulmonary:     Effort: Pulmonary effort is normal.     Breath sounds: Normal breath sounds.  Abdominal:     General: Abdomen is flat.     Palpations: Abdomen is soft.  Skin:    General: Skin is warm.  Neurological:     General: No focal deficit present.     Mental Status: She is alert and oriented to person, place, and time. Mental status is at baseline.  Psychiatric:        Mood and Affect: Mood normal.        Thought Content: Thought content normal.        Judgment: Judgment normal.      ED Treatments / Results  Labs (all labs ordered are listed, but only abnormal results are displayed) Labs Reviewed  CBC WITH DIFFERENTIAL/PLATELET - Abnormal; Notable for the following components:      Result Value   WBC 16.2 (*)    Neutro Abs 11.4 (*)    Monocytes Absolute 1.3 (*)      All other components within normal limits  BASIC METABOLIC PANEL - Abnormal; Notable for the following components:   Glucose, Bld 110 (*)    All other components within normal limits    EKG None  Radiology Ct Orbits W Contrast  Result Date: 02/27/2018 CLINICAL DATA:  85 y/o F; left periorbital swelling since Friday with worsening. EXAM: CT ORBITS WITH CONTRAST TECHNIQUE: Multidetector CT images was performed according to the standard protocol following intravenous contrast administration. CONTRAST:  30mL OMNIPAQUE IOHEXOL 300 MG/ML  SOLN COMPARISON:  None. FINDINGS: Orbits: 10 mm left-sided dacryocystocele with thick wall enhancement and mild surrounding inflammation a periorbital soft tissues. Mild adjacent inflammation in the postseptal fat inferior to the globe. No orbital compartment abscess. Bilateral intra-ocular lens replacement. Normal right orbit. Visualized sinuses: Nasal septal defect. Opacification of left frontal sinus drainage pathway and left ostiomeatal unit, possibly obstructing lesion within the left middle meatus. Normal aeration of mastoid air cells. Soft tissues: Mild left periorbital soft tissue inflammation. Limited intracranial: No significant or unexpected finding. IMPRESSION: 1. Acute left-sided dacryocystitis and dacryocystocele. Surrounding periorbital inflammation as well as mild orbital inflammation inferior to the globe. No orbital compartment abscess or proptosis. 2. Nasal septal defect. 3. Opacification of left frontal sinus drainage pathway and left ostiomeatal unit, possibly representing a obstructing lesion within the left middle meatus. Direct visualization recommended. Electronically Signed   By: Kristine Garbe M.D.   On: 02/27/2018 19:20   Procedures Procedures (including critical care time)  Medications Ordered in ED Medications  vancomycin (VANCOCIN) IVPB 1000 mg/200 mL premix (0 mg Intravenous Stopped 02/27/18 1914)  iohexol (OMNIPAQUE) 300  MG/ML solution 75 mL (75 mLs Intravenous Contrast Given 02/27/18 1829)   Initial Impression / Assessment and Plan / ED Course  I have reviewed the triage vital signs and the nursing notes.  Pertinent labs & imaging results that were available during my care of the patient were reviewed by me and considered in my medical decision making (see chart for details).   Patient here after calling ophthalmologist. Left eye pictured above. Patient with extra-ocular movements intact but with pain, concern for left orbital cellulitis. Will obtain CT orbit with contrast to r/o orbital cellulitis. Will obtain CBC, BMP.   Will start empiric vancomycin 1000mg .   CBC with elevated WBC and neutrophils, BMP wnl. CT orbit with contrast shows: Acute left-sided dacryocystitis and dacryocystocele. Surrounding periorbital inflammation as well as mild orbital inflammation inferior to the globe. No orbital compartment abscess or proptosis.  Spoke with ophthalmology, Dr. Coralyn Pear over the phone who reports that they are not able to drain the dacryocystocele but the patient will require that. He says that likely patient will need transfer to [facility] where her ophthalmologist is located and she can have the dacryocystocele darined.    Spoke with Dr. Charm Barges with [facility] who will accept patient transfer. S/p vancomycin 1000mg  x1.   Patient re-evaluated prior to transfer  At 2125 and remains stable and well-appearing with stable vitals. Answered all questions as able and patient to be transferred to [place].   [place] , DO PGY-2, [facility]   Final Clinical Impressions(s) / ED Diagnoses   Final diagnoses:  Dacrocystitis, left  Periorbital cellulitis of left eye    ED Discharge Orders    None       , [place], DO 02/27/18 2126    , [place], DO 02/27/18 2132    Lacretia Leigh, MD 02/27/18 2326

## 2018-02-27 NOTE — ED Provider Notes (Signed)
I saw and evaluated the patient, reviewed the resident's note and I agree with the findings and plan.  EKG: None 85 year old female here complaining of left periorbital swelling since Friday that is worsened.  Called her ophthalmologist who told her to go to [facility] which she did not do.  Came here instead.  Has evidence of cellulitis.  Concern for possible deeper infection in the orbit and will obtain CT.   Lacretia Leigh, MD 02/27/18 [phone]

## 2018-02-27 NOTE — ED Triage Notes (Signed)
Left eye swelling and red since  Friday , pt is legally blind anyway only see shadows

## 2018-02-28 DIAGNOSIS — L03213 Periorbital cellulitis: Secondary | ICD-10-CM | POA: Diagnosis not present

## 2018-02-28 DIAGNOSIS — H04303 Unspecified dacryocystitis of bilateral lacrimal passages: Secondary | ICD-10-CM | POA: Diagnosis present

## 2018-02-28 DIAGNOSIS — A4901 Methicillin susceptible Staphylococcus aureus infection, unspecified site: Secondary | ICD-10-CM | POA: Diagnosis present

## 2018-02-28 DIAGNOSIS — R269 Unspecified abnormalities of gait and mobility: Secondary | ICD-10-CM | POA: Diagnosis not present

## 2018-02-28 DIAGNOSIS — E039 Hypothyroidism, unspecified: Secondary | ICD-10-CM | POA: Diagnosis present

## 2018-02-28 DIAGNOSIS — H0469 Other changes of lacrimal passages: Secondary | ICD-10-CM | POA: Diagnosis present

## 2018-02-28 DIAGNOSIS — Z79899 Other long term (current) drug therapy: Secondary | ICD-10-CM | POA: Diagnosis not present

## 2018-02-28 DIAGNOSIS — E785 Hyperlipidemia, unspecified: Secondary | ICD-10-CM | POA: Diagnosis present

## 2018-02-28 DIAGNOSIS — Z7901 Long term (current) use of anticoagulants: Secondary | ICD-10-CM | POA: Diagnosis not present

## 2018-02-28 DIAGNOSIS — I10 Essential (primary) hypertension: Secondary | ICD-10-CM | POA: Diagnosis not present

## 2018-02-28 DIAGNOSIS — H409 Unspecified glaucoma: Secondary | ICD-10-CM | POA: Diagnosis not present

## 2018-02-28 DIAGNOSIS — I482 Chronic atrial fibrillation, unspecified: Secondary | ICD-10-CM | POA: Diagnosis not present

## 2018-02-28 DIAGNOSIS — H353190 Nonexudative age-related macular degeneration, unspecified eye, stage unspecified: Secondary | ICD-10-CM | POA: Diagnosis present

## 2018-02-28 DIAGNOSIS — I4891 Unspecified atrial fibrillation: Secondary | ICD-10-CM | POA: Diagnosis not present

## 2018-02-28 DIAGNOSIS — Z83511 Family history of glaucoma: Secondary | ICD-10-CM | POA: Diagnosis not present

## 2018-02-28 DIAGNOSIS — I5032 Chronic diastolic (congestive) heart failure: Secondary | ICD-10-CM | POA: Diagnosis present

## 2018-02-28 DIAGNOSIS — Z833 Family history of diabetes mellitus: Secondary | ICD-10-CM | POA: Diagnosis not present

## 2018-02-28 DIAGNOSIS — H4010X3 Unspecified open-angle glaucoma, severe stage: Secondary | ICD-10-CM | POA: Diagnosis present

## 2018-02-28 DIAGNOSIS — H401133 Primary open-angle glaucoma, bilateral, severe stage: Secondary | ICD-10-CM | POA: Diagnosis not present

## 2018-02-28 DIAGNOSIS — H05012 Cellulitis of left orbit: Secondary | ICD-10-CM | POA: Diagnosis present

## 2018-02-28 DIAGNOSIS — I69351 Hemiplegia and hemiparesis following cerebral infarction affecting right dominant side: Secondary | ICD-10-CM | POA: Diagnosis not present

## 2018-02-28 DIAGNOSIS — I110 Hypertensive heart disease with heart failure: Secondary | ICD-10-CM | POA: Diagnosis present

## 2018-02-28 DIAGNOSIS — H04302 Unspecified dacryocystitis of left lacrimal passage: Secondary | ICD-10-CM | POA: Diagnosis not present

## 2018-03-28 DIAGNOSIS — H401133 Primary open-angle glaucoma, bilateral, severe stage: Secondary | ICD-10-CM | POA: Diagnosis not present

## 2018-05-02 DIAGNOSIS — H401133 Primary open-angle glaucoma, bilateral, severe stage: Secondary | ICD-10-CM | POA: Diagnosis not present

## 2018-06-29 DIAGNOSIS — R5383 Other fatigue: Secondary | ICD-10-CM | POA: Diagnosis not present

## 2018-06-29 DIAGNOSIS — I6389 Other cerebral infarction: Secondary | ICD-10-CM | POA: Diagnosis not present

## 2018-06-29 DIAGNOSIS — E039 Hypothyroidism, unspecified: Secondary | ICD-10-CM | POA: Diagnosis not present

## 2018-06-29 DIAGNOSIS — I69959 Hemiplegia and hemiparesis following unspecified cerebrovascular disease affecting unspecified side: Secondary | ICD-10-CM | POA: Diagnosis not present

## 2018-06-29 DIAGNOSIS — I10 Essential (primary) hypertension: Secondary | ICD-10-CM | POA: Diagnosis not present

## 2018-06-29 DIAGNOSIS — I4891 Unspecified atrial fibrillation: Secondary | ICD-10-CM | POA: Diagnosis not present

## 2018-06-29 DIAGNOSIS — H409 Unspecified glaucoma: Secondary | ICD-10-CM | POA: Diagnosis not present

## 2018-06-29 DIAGNOSIS — F329 Major depressive disorder, single episode, unspecified: Secondary | ICD-10-CM | POA: Diagnosis not present

## 2018-06-29 DIAGNOSIS — M8580 Other specified disorders of bone density and structure, unspecified site: Secondary | ICD-10-CM | POA: Diagnosis not present

## 2018-08-08 DIAGNOSIS — H401133 Primary open-angle glaucoma, bilateral, severe stage: Secondary | ICD-10-CM | POA: Diagnosis not present

## 2018-11-03 ENCOUNTER — Encounter: Payer: Self-pay | Admitting: Cardiovascular Disease

## 2018-11-03 ENCOUNTER — Other Ambulatory Visit: Payer: Self-pay

## 2018-11-03 ENCOUNTER — Ambulatory Visit (INDEPENDENT_AMBULATORY_CARE_PROVIDER_SITE_OTHER): Payer: Medicare Other | Admitting: Cardiovascular Disease

## 2018-11-03 VITALS — BP 130/78 | HR 82 | Ht 66.0 in | Wt 152.0 lb

## 2018-11-03 DIAGNOSIS — I4891 Unspecified atrial fibrillation: Secondary | ICD-10-CM | POA: Insufficient documentation

## 2018-11-03 DIAGNOSIS — I10 Essential (primary) hypertension: Secondary | ICD-10-CM | POA: Diagnosis not present

## 2018-11-03 DIAGNOSIS — I4819 Other persistent atrial fibrillation: Secondary | ICD-10-CM | POA: Diagnosis not present

## 2018-11-03 DIAGNOSIS — I482 Chronic atrial fibrillation, unspecified: Secondary | ICD-10-CM | POA: Insufficient documentation
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
## 2018-11-03 NOTE — Patient Instructions (Signed)
Medication Instructions:  Your physician recommends that you continue on your current medications as directed. Please refer to the Current Medication list given to you today.  If you need a refill on your cardiac medications before your next appointment, please call your pharmacy.    Lab work: None Ordered  If you have labs (blood work) drawn today and your tests are completely normal, you will receive your results only by: . MyChart Message (if you have MyChart) OR . A paper copy in the mail If you have any lab test that is abnormal or we need to change your treatment, we will call you to review the results.   Testing/Procedures: None Ordered   Follow-Up: At [facility], you and your health needs are our priority.  As part of our continuing mission to provide you with exceptional heart care, we have created designated Provider Care Teams.  These Care Teams include your primary Cardiologist (physician) and Advanced Practice Providers (APPs -  Physician Assistants and Nurse Practitioners) who all work together to provide you with the care you need, when you need it. You will need a follow up appointment in:  1 years.  Please call our office 2 months in advance to schedule this appointment.  You may see Philip Nahser, MD or one of the following Advanced Practice Providers on your designated Care Team: Scott Weaver, PA-C Vin Bhagat, PA-C . Janine Hammond, NP    

## 2018-11-14 DIAGNOSIS — H401133 Primary open-angle glaucoma, bilateral, severe stage: Secondary | ICD-10-CM | POA: Diagnosis not present

## 2018-11-17 DIAGNOSIS — E559 Vitamin D deficiency, unspecified: Secondary | ICD-10-CM | POA: Diagnosis not present

## 2018-11-17 DIAGNOSIS — E7849 Other hyperlipidemia: Secondary | ICD-10-CM | POA: Diagnosis not present

## 2018-11-17 DIAGNOSIS — R7301 Impaired fasting glucose: Secondary | ICD-10-CM | POA: Diagnosis not present

## 2018-11-17 DIAGNOSIS — M8580 Other specified disorders of bone density and structure, unspecified site: Secondary | ICD-10-CM | POA: Diagnosis not present

## 2018-11-17 DIAGNOSIS — E038 Other specified hypothyroidism: Secondary | ICD-10-CM | POA: Diagnosis not present

## 2018-11-21 DIAGNOSIS — I69959 Hemiplegia and hemiparesis following unspecified cerebrovascular disease affecting unspecified side: Secondary | ICD-10-CM | POA: Diagnosis not present

## 2018-11-21 DIAGNOSIS — I10 Essential (primary) hypertension: Secondary | ICD-10-CM | POA: Diagnosis not present

## 2018-11-21 DIAGNOSIS — I499 Cardiac arrhythmia, unspecified: Secondary | ICD-10-CM | POA: Diagnosis not present

## 2018-11-21 DIAGNOSIS — R809 Proteinuria, unspecified: Secondary | ICD-10-CM | POA: Diagnosis not present

## 2018-11-21 DIAGNOSIS — I6389 Other cerebral infarction: Secondary | ICD-10-CM | POA: Diagnosis not present

## 2018-11-21 DIAGNOSIS — Z23 Encounter for immunization: Secondary | ICD-10-CM | POA: Diagnosis not present

## 2018-11-21 DIAGNOSIS — Z1331 Encounter for screening for depression: Secondary | ICD-10-CM | POA: Diagnosis not present

## 2018-11-21 DIAGNOSIS — I4891 Unspecified atrial fibrillation: Secondary | ICD-10-CM | POA: Diagnosis not present

## 2018-11-21 DIAGNOSIS — G459 Transient cerebral ischemic attack, unspecified: Secondary | ICD-10-CM | POA: Diagnosis not present

## 2018-11-21 DIAGNOSIS — F329 Major depressive disorder, single episode, unspecified: Secondary | ICD-10-CM | POA: Diagnosis not present

## 2018-11-21 DIAGNOSIS — Z85828 Personal history of other malignant neoplasm of skin: Secondary | ICD-10-CM | POA: Diagnosis not present

## 2018-11-21 DIAGNOSIS — R3129 Other microscopic hematuria: Secondary | ICD-10-CM | POA: Diagnosis not present

## 2018-11-21 DIAGNOSIS — N390 Urinary tract infection, site not specified: Secondary | ICD-10-CM | POA: Diagnosis not present

## 2018-11-21 DIAGNOSIS — R82998 Other abnormal findings in urine: Secondary | ICD-10-CM | POA: Diagnosis not present

## 2018-11-21 DIAGNOSIS — Z0000 Encounter for general adult medical examination without abnormal findings: Secondary | ICD-10-CM | POA: Diagnosis not present

## 2018-12-09 DIAGNOSIS — L7211 Pilar cyst: Secondary | ICD-10-CM | POA: Diagnosis not present

## 2018-12-09 DIAGNOSIS — B078 Other viral warts: Secondary | ICD-10-CM | POA: Diagnosis not present

## 2018-12-09 DIAGNOSIS — Z85828 Personal history of other malignant neoplasm of skin: Secondary | ICD-10-CM | POA: Diagnosis not present

## 2018-12-09 DIAGNOSIS — D2239 Melanocytic nevi of other parts of face: Secondary | ICD-10-CM | POA: Diagnosis not present

## 2018-12-09 DIAGNOSIS — L821 Other seborrheic keratosis: Secondary | ICD-10-CM | POA: Diagnosis not present

## 2018-12-09 DIAGNOSIS — L570 Actinic keratosis: Secondary | ICD-10-CM | POA: Diagnosis not present

## 2018-12-09 DIAGNOSIS — L738 Other specified follicular disorders: Secondary | ICD-10-CM | POA: Diagnosis not present

## 2019-01-12 ENCOUNTER — Inpatient Hospital Stay (HOSPITAL_COMMUNITY)
Admission: EM | Admit: 2019-01-12 | Discharge: 2019-01-14 | DRG: 065 | Disposition: A | Discharge status: Home / Self Care | Payer: Medicare Other | Attending: Internal Medicine | Admitting: Internal Medicine

## 2019-01-12 ENCOUNTER — Emergency Department (HOSPITAL_COMMUNITY): Payer: Medicare Other

## 2019-01-12 DIAGNOSIS — I10 Essential (primary) hypertension: Secondary | ICD-10-CM | POA: Diagnosis present

## 2019-01-12 DIAGNOSIS — I4821 Permanent atrial fibrillation: Secondary | ICD-10-CM | POA: Diagnosis not present

## 2019-01-12 DIAGNOSIS — I6782 Cerebral ischemia: Secondary | ICD-10-CM | POA: Diagnosis not present

## 2019-01-12 DIAGNOSIS — I69351 Hemiplegia and hemiparesis following cerebral infarction affecting right dominant side: Secondary | ICD-10-CM

## 2019-01-12 DIAGNOSIS — I63419 Cerebral infarction due to embolism of unspecified middle cerebral artery: Secondary | ICD-10-CM | POA: Diagnosis not present

## 2019-01-12 DIAGNOSIS — H9190 Unspecified hearing loss, unspecified ear: Secondary | ICD-10-CM | POA: Diagnosis present

## 2019-01-12 DIAGNOSIS — H547 Unspecified visual loss: Secondary | ICD-10-CM | POA: Diagnosis present

## 2019-01-12 DIAGNOSIS — E039 Hypothyroidism, unspecified: Secondary | ICD-10-CM | POA: Diagnosis present

## 2019-01-12 DIAGNOSIS — E785 Hyperlipidemia, unspecified: Secondary | ICD-10-CM | POA: Diagnosis present

## 2019-01-12 DIAGNOSIS — E079 Disorder of thyroid, unspecified: Secondary | ICD-10-CM | POA: Diagnosis present

## 2019-01-12 DIAGNOSIS — M549 Dorsalgia, unspecified: Secondary | ICD-10-CM | POA: Diagnosis present

## 2019-01-12 DIAGNOSIS — R29810 Facial weakness: Secondary | ICD-10-CM | POA: Diagnosis not present

## 2019-01-12 DIAGNOSIS — G8929 Other chronic pain: Secondary | ICD-10-CM | POA: Diagnosis present

## 2019-01-12 DIAGNOSIS — N390 Urinary tract infection, site not specified: Secondary | ICD-10-CM | POA: Diagnosis present

## 2019-01-12 DIAGNOSIS — B952 Enterococcus as the cause of diseases classified elsewhere: Secondary | ICD-10-CM | POA: Diagnosis present

## 2019-01-12 DIAGNOSIS — M1990 Unspecified osteoarthritis, unspecified site: Secondary | ICD-10-CM | POA: Diagnosis present

## 2019-01-12 DIAGNOSIS — Z79899 Other long term (current) drug therapy: Secondary | ICD-10-CM

## 2019-01-12 DIAGNOSIS — H3530 Unspecified macular degeneration: Secondary | ICD-10-CM | POA: Diagnosis present

## 2019-01-12 DIAGNOSIS — I639 Cerebral infarction, unspecified: Secondary | ICD-10-CM

## 2019-01-12 DIAGNOSIS — Z79890 Hormone replacement therapy (postmenopausal): Secondary | ICD-10-CM

## 2019-01-12 DIAGNOSIS — Z888 Allergy status to other drugs, medicaments and biological substances status: Secondary | ICD-10-CM

## 2019-01-12 DIAGNOSIS — R531 Weakness: Secondary | ICD-10-CM | POA: Diagnosis not present

## 2019-01-12 DIAGNOSIS — N179 Acute kidney failure, unspecified: Secondary | ICD-10-CM | POA: Diagnosis not present

## 2019-01-12 DIAGNOSIS — I4891 Unspecified atrial fibrillation: Secondary | ICD-10-CM | POA: Diagnosis present

## 2019-01-12 DIAGNOSIS — H409 Unspecified glaucoma: Secondary | ICD-10-CM | POA: Diagnosis present

## 2019-01-12 DIAGNOSIS — I482 Chronic atrial fibrillation, unspecified: Secondary | ICD-10-CM | POA: Diagnosis present

## 2019-01-12 DIAGNOSIS — Z20828 Contact with and (suspected) exposure to other viral communicable diseases: Secondary | ICD-10-CM | POA: Diagnosis present

## 2019-01-12 DIAGNOSIS — R29701 NIHSS score 1: Secondary | ICD-10-CM | POA: Diagnosis present

## 2019-01-12 DIAGNOSIS — I499 Cardiac arrhythmia, unspecified: Secondary | ICD-10-CM | POA: Diagnosis not present

## 2019-01-12 DIAGNOSIS — Z7901 Long term (current) use of anticoagulants: Secondary | ICD-10-CM

## 2019-01-12 DIAGNOSIS — Z8673 Personal history of transient ischemic attack (TIA), and cerebral infarction without residual deficits: Secondary | ICD-10-CM | POA: Diagnosis present

## 2019-01-12 DIAGNOSIS — I491 Atrial premature depolarization: Secondary | ICD-10-CM | POA: Diagnosis not present

## 2019-01-12 DIAGNOSIS — R471 Dysarthria and anarthria: Secondary | ICD-10-CM | POA: Diagnosis present

## 2019-01-12 DIAGNOSIS — I6381 Other cerebral infarction due to occlusion or stenosis of small artery: Secondary | ICD-10-CM | POA: Diagnosis not present

## 2019-01-12 DIAGNOSIS — R4781 Slurred speech: Secondary | ICD-10-CM | POA: Diagnosis not present

## 2019-01-12 DIAGNOSIS — Z8249 Family history of ischemic heart disease and other diseases of the circulatory system: Secondary | ICD-10-CM

## 2019-01-12 DIAGNOSIS — M47812 Spondylosis without myelopathy or radiculopathy, cervical region: Secondary | ICD-10-CM | POA: Diagnosis not present

## 2019-01-12 LAB — URINALYSIS, ROUTINE W REFLEX MICROSCOPIC
Bacteria, UA: NONE SEEN
Bilirubin Urine: NEGATIVE
Glucose, UA: NEGATIVE mg/dL
Hgb urine dipstick: NEGATIVE
Ketones, ur: NEGATIVE mg/dL
Nitrite: NEGATIVE
Protein, ur: NEGATIVE mg/dL
Specific Gravity, Urine: 1.009 (ref 1.005–1.030)
pH: 8 (ref 5.0–8.0)

## 2019-01-12 LAB — DIFFERENTIAL
Abs Immature Granulocytes: 0.03 10*3/uL (ref 0.00–0.07)
Basophils Absolute: 0.1 10*3/uL (ref 0.0–0.1)
Basophils Relative: 1 %
Eosinophils Absolute: 0.2 10*3/uL (ref 0.0–0.5)
Eosinophils Relative: 2 %
Immature Granulocytes: 0 %
Lymphocytes Relative: 33 %
Lymphs Abs: 3.1 10*3/uL (ref 0.7–4.0)
Monocytes Absolute: 0.7 10*3/uL (ref 0.1–1.0)
Monocytes Relative: 8 %
Neutro Abs: 5.4 10*3/uL (ref 1.7–7.7)
Neutrophils Relative %: 56 %

## 2019-01-12 LAB — COMPREHENSIVE METABOLIC PANEL
ALT: 10 U/L (ref 0–44)
AST: 27 U/L (ref 15–41)
Albumin: 3.7 g/dL (ref 3.5–5.0)
Alkaline Phosphatase: 55 U/L (ref 38–126)
Anion gap: 10 (ref 5–15)
BUN: 19 mg/dL (ref 8–23)
CO2: 26 mmol/L (ref 22–32)
Calcium: 9.2 mg/dL (ref 8.9–10.3)
Chloride: 102 mmol/L (ref 98–111)
Creatinine, Ser: 1.01 mg/dL — ABNORMAL HIGH (ref 0.44–1.00)
GFR calc Af Amer: 58 mL/min — ABNORMAL LOW (ref >60)
GFR calc non Af Amer: 50 mL/min — ABNORMAL LOW (ref >60)
Glucose, Bld: 111 mg/dL — ABNORMAL HIGH (ref 70–99)
Potassium: 4.5 mmol/L (ref 3.5–5.1)
Sodium: 138 mmol/L (ref 135–145)
Total Bilirubin: 1 mg/dL (ref 0.3–1.2)
Total Protein: 6.8 g/dL (ref 6.5–8.1)

## 2019-01-12 LAB — CBC
HCT: 44.1 % (ref 36.0–46.0)
Hemoglobin: 13.8 g/dL (ref 12.0–15.0)
MCH: 28.9 pg (ref 26.0–34.0)
MCHC: 31.3 g/dL (ref 30.0–36.0)
MCV: 92.5 fL (ref 80.0–100.0)
Platelets: 254 10*3/uL (ref 150–400)
RBC: 4.77 MIL/uL (ref 3.87–5.11)
RDW: 14.4 % (ref 11.5–15.5)
WBC: 9.5 10*3/uL (ref 4.0–10.5)
nRBC: 0 % (ref 0.0–0.2)

## 2019-01-12 LAB — I-STAT CHEM 8, ED
BUN: 22 mg/dL (ref 8–23)
Calcium, Ion: 1.17 mmol/L (ref 1.15–1.40)
Chloride: 101 mmol/L (ref 98–111)
Creatinine, Ser: 1 mg/dL (ref 0.44–1.00)
Glucose, Bld: 111 mg/dL — ABNORMAL HIGH (ref 70–99)
HCT: 44 % (ref 36.0–46.0)
Hemoglobin: 15 g/dL (ref 12.0–15.0)
Potassium: 4.1 mmol/L (ref 3.5–5.1)
Sodium: 139 mmol/L (ref 135–145)
TCO2: 28 mmol/L (ref 22–32)

## 2019-01-12 LAB — ETHANOL: Alcohol, Ethyl (B): <10 mg/dL (ref <10)

## 2019-01-12 LAB — PROTIME-INR
INR: 1.1 (ref 0.8–1.2)
Prothrombin Time: 14.2 seconds (ref 11.4–15.2)

## 2019-01-12 LAB — APTT: aPTT: 30 seconds (ref 24–36)

## 2019-01-12 NOTE — ED Triage Notes (Signed)
Pt came in [facility] with c/o of stroke symptoms. Pt called family around 2030 and they noticed the slurried speech. EMS arrived and pt was not having slurried speech at that time. Pt does have R Sided Weakness d/t a stroke 5 Years ago.  -Hard Of Hearing -Vision Problems -On Elequis - Hx. Afib  107CBG, 97%RA, HR96, 171/92

## 2019-01-12 NOTE — ED Provider Notes (Signed)
[facility] EMERGENCY DEPARTMENT Provider Note   CSN: [id number] Arrival date & time: 01/12/19  2148     History   Chief Complaint Chief Complaint  Patient presents with  . Stroke Symptoms    HPI Amanda Cochran is a 85 y.o. female.     Patient with PMH of prior stroke, afib on Eliquis, HTN, glaucoma, presents to the ED with a chief complaint of slurred speech.  She states that this evening a friend of family member called her on the phone and said that it sounded like she had slurred speech.  EMS was called, and the patient was brought to the ED for stroke symptoms.  Patient states that the slurred speech has resolved.  She states that she also felt some numbness in her left arm and leg, but now she states that it just feels like here fingers are cold.  She has residual right sided weakness from a prior stroke.  She denies any other associated symptoms.  Last known normal yesterday per family.  The history is provided by the patient. No language interpreter was used.    Past Medical History:  Diagnosis Date  . Arthritis   . Bladder infection   . CVA (cerebral infarction) 02/07/2014   Left basal ganglia infarct with right hemiparesis   . Glaucoma   . Glaucoma   . Glaucoma (increased eye pressure)   . Hyperlipidemia   . Hypertension   . Left sided lacunar stroke ([place]) 02/14/2014  . Right hemiparesis ([place]) 02/09/2014   Left basal ganglia infarct Dec 2015   . Stroke ([place])   . Thyroid disease     Patient Active Problem List   Diagnosis Date Noted  . Atrial fibrillation ([place]) 11/03/2018  . Left sided lacunar stroke ([place]) 02/14/2014  . Right hemiparesis ([place]) 02/09/2014  . Cerebral infarction ([place]) 02/07/2014  . Hyperlipidemia   . Essential hypertension 02/03/2014    Past Surgical History:  Procedure Laterality Date  . cataract surgery Bilateral 2015  . THYROID SURGERY  1976  . TONSILLECTOMY AND ADENOIDECTOMY    . VAGINAL PROLAPSE REPAIR       OB  History   No obstetric history on file.      Home Medications    Prior to Admission medications   Medication Sig Start Date End Date Taking? Authorizing Provider  amLODipine (NORVASC) 5 MG tablet Take 1 tablet by mouth daily. 03/03/18   [provider]  apixaban (ELIQUIS) 5 MG TABS tablet Take 5 mg by mouth 2 (two) times daily.    [provider]  benazepril (LOTENSIN) 20 MG tablet Take 20 mg by mouth daily.    [provider]  Cholecalciferol (VITAMIN D) 2000 UNITS CAPS Take 1 capsule by mouth daily.    [provider]  dorzolamide-timolol (COSOPT) 22.3-6.8 MG/ML ophthalmic solution Place 1 drop into both eyes 2 (two) times daily.    [provider]  hydrALAZINE (APRESOLINE) 25 MG tablet Take 1 tablet (25 mg total) by mouth every 8 (eight) hours. 02/19/14   Angiulli, Lavon Paganini, PA-C  latanoprost (XALATAN) 0.005 % ophthalmic solution Place 1 drop into both eyes at bedtime.     [provider]  levothyroxine (SYNTHROID, LEVOTHROID) 100 MCG tablet Take 1 tablet (100 mcg total) by mouth daily. 02/19/14   Angiulli, Lavon Paganini, PA-C  Multiple Vitamins-Minerals (CENTRUM SILVER PO) Take 1 tablet by mouth daily.    [provider]  Omega-3 Fatty Acids (FISH OIL) 1000 MG  CAPS Take 1,000 mg by mouth daily.     [provider]  pilocarpine (PILOCAR) 1 % ophthalmic solution Place 1 drop into both eyes 4 (four) times daily.  08/06/16   [provider]  simvastatin (ZOCOR) 40 MG tablet Take 40 mg by mouth every evening.    [provider]    Family History Family History  Problem Relation Age of Onset  . Heart failure Mother   . AAA (abdominal aortic aneurysm) Father     Social History Social History   Tobacco Use  . Smoking status: Never Smoker  . Smokeless tobacco: Never Used  Substance Use Topics  . Alcohol use: No  . Drug use: No     Allergies   Patient has no known allergies.   Review of Systems  Review of Systems  All other systems reviewed and are negative.    Physical Exam Updated Vital Signs BP (!) 189/76 (BP Location: Right Arm)   Pulse (!) 108   Temp (!) 97.5 F (36.4 C) (Oral)   Resp 14   Ht 5\' 6"  (1.676 m)   Wt 68 kg   SpO2 98%   BMI 24.21 kg/m   Physical Exam Vitals signs and nursing note reviewed.  Constitutional:      General: She is not in acute distress.    Appearance: She is well-developed.  HENT:     Head: Normocephalic and atraumatic.  Eyes:     Conjunctiva/sclera: Conjunctivae normal.  Neck:     Musculoskeletal: Neck supple.  Cardiovascular:     Rate and Rhythm: Normal rate and regular rhythm.     Heart sounds: No murmur.  Pulmonary:     Effort: Pulmonary effort is normal. No respiratory distress.     Breath sounds: Normal breath sounds.  Abdominal:     Palpations: Abdomen is soft.     Tenderness: There is no abdominal tenderness.  Musculoskeletal: Normal range of motion.  Skin:    General: Skin is warm and dry.  Neurological:     Mental Status: She is alert and oriented to person, place, and time.     Comments: CN 3-12 intact, speech is clear, normal finger to nose, normal sensation, upper and lower extremity strength bilateral despite reported right sided deficits  Psychiatric:        Mood and Affect: Mood normal.        Behavior: Behavior normal.      ED Treatments / Results  Labs (all labs ordered are listed, but only abnormal results are displayed) Labs Reviewed  CBC  DIFFERENTIAL  ETHANOL  PROTIME-INR  APTT  COMPREHENSIVE METABOLIC PANEL  RAPID URINE DRUG SCREEN, HOSP PERFORMED  URINALYSIS, ROUTINE W REFLEX MICROSCOPIC  I-STAT CHEM 8, ED    EKG EKG Interpretation  Date/Time:  Thursday January 12 2019 21:52:04 EST Ventricular Rate:  101 PR Interval:    QRS Duration: 102 QT Interval:  342 QTC Calculation: 443 R Axis:   62 Text Interpretation: Atrial fibrillation with rapid ventricular response with premature  ventricular or aberrantly conducted complexes Cannot rule out Anterior infarct , age undetermined Abnormal ECG No significant change was found Confirmed by Ezequiel Essex [phone]) on 01/12/2019 10:05:13 PM   Radiology No results found.  Procedures Procedures (including critical care time)  Medications Ordered in ED Medications - No data to display   Initial Impression / Assessment and Plan / ED Course  I have reviewed the triage vital signs and the nursing notes.  Pertinent labs &  imaging results that were available during my care of the patient were reviewed by me and considered in my medical decision making (see chart for details).       Patient with reported slurred speech and left sided extremity numbness.  No slurred speech now. Reports that her fingers on the left feel cool now, but no longer numb.  She states she thinks she is just cold now.  She has hx of stroke and afib and is on Eliquis.  Seen by and discussed with Dr. Wyvonnia Dusky, who recommends stroke workup, but not code stroke.  No current slurred speech or objective findings.  CT notable for old strokes.  Labs are reassuring.  11:16 PM Case discussed with Dr. Rory Percy, who recommends MRI.  If negative, can go home without any changes to her meds.  If positive, will need admission to optimize therapy.  Right MRI shows several small scattered cortical infarcts in the left MCA.  Will consult hospitalist for admission.  Final Clinical Impressions(s) / ED Diagnoses   Final diagnoses:  Cerebrovascular accident (CVA), unspecified mechanism [facility])    ED Discharge Orders    None       Montine Circle, PA-C 01/13/19 0315    Ezequiel Essex, MD 01/13/19 [phone]

## 2019-01-12 NOTE — ED Notes (Signed)
Pt placed on purewick 

## 2019-01-13 ENCOUNTER — Other Ambulatory Visit: Payer: Self-pay

## 2019-01-13 ENCOUNTER — Observation Stay (HOSPITAL_COMMUNITY): Payer: Medicare Other

## 2019-01-13 ENCOUNTER — Encounter (HOSPITAL_COMMUNITY): Payer: Self-pay | Admitting: Internal Medicine

## 2019-01-13 ENCOUNTER — Emergency Department (HOSPITAL_COMMUNITY): Payer: Medicare Other

## 2019-01-13 DIAGNOSIS — H409 Unspecified glaucoma: Secondary | ICD-10-CM | POA: Diagnosis present

## 2019-01-13 DIAGNOSIS — G8929 Other chronic pain: Secondary | ICD-10-CM | POA: Diagnosis present

## 2019-01-13 DIAGNOSIS — Z79899 Other long term (current) drug therapy: Secondary | ICD-10-CM | POA: Diagnosis not present

## 2019-01-13 DIAGNOSIS — I69351 Hemiplegia and hemiparesis following cerebral infarction affecting right dominant side: Secondary | ICD-10-CM | POA: Diagnosis not present

## 2019-01-13 DIAGNOSIS — I4891 Unspecified atrial fibrillation: Secondary | ICD-10-CM

## 2019-01-13 DIAGNOSIS — Z7901 Long term (current) use of anticoagulants: Secondary | ICD-10-CM | POA: Diagnosis not present

## 2019-01-13 DIAGNOSIS — E039 Hypothyroidism, unspecified: Secondary | ICD-10-CM | POA: Diagnosis present

## 2019-01-13 DIAGNOSIS — I10 Essential (primary) hypertension: Secondary | ICD-10-CM

## 2019-01-13 DIAGNOSIS — R29701 NIHSS score 1: Secondary | ICD-10-CM | POA: Diagnosis present

## 2019-01-13 DIAGNOSIS — Z20828 Contact with and (suspected) exposure to other viral communicable diseases: Secondary | ICD-10-CM | POA: Diagnosis present

## 2019-01-13 DIAGNOSIS — E785 Hyperlipidemia, unspecified: Secondary | ICD-10-CM | POA: Diagnosis present

## 2019-01-13 DIAGNOSIS — R471 Dysarthria and anarthria: Secondary | ICD-10-CM | POA: Diagnosis present

## 2019-01-13 DIAGNOSIS — I361 Nonrheumatic tricuspid (valve) insufficiency: Secondary | ICD-10-CM

## 2019-01-13 DIAGNOSIS — I63419 Cerebral infarction due to embolism of unspecified middle cerebral artery: Secondary | ICD-10-CM | POA: Diagnosis present

## 2019-01-13 DIAGNOSIS — H9190 Unspecified hearing loss, unspecified ear: Secondary | ICD-10-CM | POA: Diagnosis present

## 2019-01-13 DIAGNOSIS — I6381 Other cerebral infarction due to occlusion or stenosis of small artery: Secondary | ICD-10-CM | POA: Diagnosis not present

## 2019-01-13 DIAGNOSIS — I639 Cerebral infarction, unspecified: Secondary | ICD-10-CM

## 2019-01-13 DIAGNOSIS — Z8673 Personal history of transient ischemic attack (TIA), and cerebral infarction without residual deficits: Secondary | ICD-10-CM | POA: Diagnosis present

## 2019-01-13 DIAGNOSIS — N179 Acute kidney failure, unspecified: Secondary | ICD-10-CM | POA: Diagnosis present

## 2019-01-13 DIAGNOSIS — Z79890 Hormone replacement therapy (postmenopausal): Secondary | ICD-10-CM | POA: Diagnosis not present

## 2019-01-13 DIAGNOSIS — I6782 Cerebral ischemia: Secondary | ICD-10-CM | POA: Diagnosis not present

## 2019-01-13 DIAGNOSIS — H3530 Unspecified macular degeneration: Secondary | ICD-10-CM | POA: Diagnosis present

## 2019-01-13 DIAGNOSIS — H547 Unspecified visual loss: Secondary | ICD-10-CM | POA: Diagnosis present

## 2019-01-13 DIAGNOSIS — M47812 Spondylosis without myelopathy or radiculopathy, cervical region: Secondary | ICD-10-CM | POA: Diagnosis not present

## 2019-01-13 DIAGNOSIS — M1990 Unspecified osteoarthritis, unspecified site: Secondary | ICD-10-CM | POA: Diagnosis present

## 2019-01-13 DIAGNOSIS — I4821 Permanent atrial fibrillation: Secondary | ICD-10-CM | POA: Diagnosis present

## 2019-01-13 DIAGNOSIS — Z888 Allergy status to other drugs, medicaments and biological substances status: Secondary | ICD-10-CM | POA: Diagnosis not present

## 2019-01-13 DIAGNOSIS — M549 Dorsalgia, unspecified: Secondary | ICD-10-CM | POA: Diagnosis present

## 2019-01-13 DIAGNOSIS — N390 Urinary tract infection, site not specified: Secondary | ICD-10-CM | POA: Diagnosis present

## 2019-01-13 DIAGNOSIS — E079 Disorder of thyroid, unspecified: Secondary | ICD-10-CM | POA: Diagnosis present

## 2019-01-13 DIAGNOSIS — Z8249 Family history of ischemic heart disease and other diseases of the circulatory system: Secondary | ICD-10-CM | POA: Diagnosis not present

## 2019-01-13 LAB — RAPID URINE DRUG SCREEN, HOSP PERFORMED
Amphetamines: NOT DETECTED
Barbiturates: NOT DETECTED
Benzodiazepines: NOT DETECTED
Cocaine: NOT DETECTED
Opiates: NOT DETECTED
Tetrahydrocannabinol: NOT DETECTED

## 2019-01-13 LAB — LIPID PANEL
Cholesterol: 211 mg/dL — ABNORMAL HIGH (ref 0–200)
HDL: 51 mg/dL (ref >40)
LDL Cholesterol: 130 mg/dL — ABNORMAL HIGH (ref 0–99)
Total CHOL/HDL Ratio: 4.1 RATIO
Triglycerides: 152 mg/dL — ABNORMAL HIGH (ref <150)
VLDL: 30 mg/dL (ref 0–40)

## 2019-01-13 LAB — ECHOCARDIOGRAM COMPLETE
Height: 66 in
Weight: 2400 oz

## 2019-01-13 LAB — HEMOGLOBIN A1C
Hgb A1c MFr Bld: 5.9 % — ABNORMAL HIGH (ref 4.8–5.6)
Mean Plasma Glucose: 122.63 mg/dL

## 2019-01-13 LAB — PROTIME-INR
INR: 1.1 (ref 0.8–1.2)
Prothrombin Time: 14.1 seconds (ref 11.4–15.2)

## 2019-01-13 LAB — APTT: aPTT: 31 seconds (ref 24–36)

## 2019-01-13 LAB — HEPARIN LEVEL (UNFRACTIONATED): Heparin Unfractionated: 1.1 IU/mL — ABNORMAL HIGH (ref 0.30–0.70)

## 2019-01-13 LAB — SARS CORONAVIRUS 2 (TAT 6-24 HRS): SARS Coronavirus 2: NEGATIVE

## 2019-01-13 MED ORDER — ACETAMINOPHEN 160 MG/5ML PO SOLN: 650.0000 mg | ORAL | Status: DC | PRN

## 2019-01-13 MED ORDER — CEPHALEXIN 500 MG PO CAPS
500.0000 mg | ORAL_CAPSULE | Freq: Two times a day (BID) | ORAL | Status: DC
  Administered 2019-01-13 – 2019-01-14 (×4): 500 mg via ORAL
  Filled 2019-01-13: qty 1
  Filled 2019-01-13 (×2): qty 2
  Filled 2019-01-13: qty 1

## 2019-01-13 MED ORDER — HEPARIN (PORCINE) 25000 UT/250ML-% IV SOLN: 750.0000 [IU]/h | INTRAVENOUS | Status: DC

## 2019-01-13 MED ORDER — SIMVASTATIN 20 MG PO TABS: 40.0000 mg | ORAL_TABLET | Freq: Every evening | ORAL | Status: DC

## 2019-01-13 MED ORDER — ATORVASTATIN CALCIUM 40 MG PO TABS
40.0000 mg | ORAL_TABLET | Freq: Every day | ORAL | Status: DC
  Administered 2019-01-13: 40 mg via ORAL
  Filled 2019-01-13: qty 1

## 2019-01-13 MED ORDER — ACETAMINOPHEN 325 MG PO TABS: 650.0000 mg | ORAL_TABLET | ORAL | Status: DC | PRN

## 2019-01-13 MED ORDER — ASPIRIN 300 MG RE SUPP: 300.0000 mg | Freq: Every day | RECTAL | Status: DC

## 2019-01-13 MED ORDER — STROKE: EARLY STAGES OF RECOVERY BOOK
Freq: Once | Status: AC
  Administered 2019-01-13: 06:00:00
  Filled 2019-01-13: qty 1

## 2019-01-13 MED ORDER — BENAZEPRIL HCL 20 MG PO TABS
20.0000 mg | ORAL_TABLET | Freq: Every day | ORAL | Status: DC
  Administered 2019-01-13 – 2019-01-14 (×2): 20 mg via ORAL
  Filled 2019-01-13 (×2): qty 1

## 2019-01-13 MED ORDER — PILOCARPINE HCL 1 % OP SOLN
1.0000 [drp] | Freq: Three times a day (TID) | OPHTHALMIC | Status: DC
  Administered 2019-01-13 – 2019-01-14 (×3): 1 [drp] via OPHTHALMIC
  Filled 2019-01-13 (×2): qty 15

## 2019-01-13 MED ORDER — LATANOPROST 0.005 % OP SOLN
1.0000 [drp] | Freq: Every day | OPHTHALMIC | Status: DC
  Administered 2019-01-13: 1 [drp] via OPHTHALMIC
  Filled 2019-01-13 (×2): qty 2.5

## 2019-01-13 MED ORDER — IOHEXOL 350 MG/ML SOLN
75.0000 mL | Freq: Once | INTRAVENOUS | Status: AC | PRN
  Administered 2019-01-13: 75 mL via INTRAVENOUS

## 2019-01-13 MED ORDER — HYDRALAZINE HCL 25 MG PO TABS
25.0000 mg | ORAL_TABLET | Freq: Three times a day (TID) | ORAL | Status: DC
  Administered 2019-01-13 – 2019-01-14 (×5): 25 mg via ORAL
  Filled 2019-01-13 (×5): qty 1

## 2019-01-13 MED ORDER — ASPIRIN 325 MG PO TABS: 325.0000 mg | ORAL_TABLET | Freq: Every day | ORAL | Status: DC

## 2019-01-13 MED ORDER — LEVOTHYROXINE SODIUM 100 MCG PO TABS
100.0000 ug | ORAL_TABLET | Freq: Every day | ORAL | Status: DC
  Administered 2019-01-13 – 2019-01-14 (×2): 100 ug via ORAL
  Filled 2019-01-13 (×2): qty 1

## 2019-01-13 MED ORDER — DORZOLAMIDE HCL-TIMOLOL MAL 2-0.5 % OP SOLN
1.0000 [drp] | Freq: Two times a day (BID) | OPHTHALMIC | Status: DC
  Administered 2019-01-13 – 2019-01-14 (×2): 1 [drp] via OPHTHALMIC
  Filled 2019-01-13 (×2): qty 10

## 2019-01-13 MED ORDER — AMLODIPINE BESYLATE 5 MG PO TABS
5.0000 mg | ORAL_TABLET | Freq: Every day | ORAL | Status: DC
  Administered 2019-01-13 – 2019-01-14 (×2): 5 mg via ORAL
  Filled 2019-01-13 (×2): qty 1

## 2019-01-13 MED ORDER — ACETAMINOPHEN 650 MG RE SUPP: 650.0000 mg | RECTAL | Status: DC | PRN

## 2019-01-13 MED ORDER — OMEGA-3-ACID ETHYL ESTERS 1 G PO CAPS
1.0000 g | ORAL_CAPSULE | Freq: Every day | ORAL | Status: DC
  Administered 2019-01-14: 1 g via ORAL
  Filled 2019-01-13 (×2): qty 1

## 2019-01-13 MED ORDER — HEPARIN (PORCINE) 25000 UT/250ML-% IV SOLN
750.0000 [IU]/h | INTRAVENOUS | Status: DC
  Administered 2019-01-13: 750 [IU]/h via INTRAVENOUS
  Filled 2019-01-13: qty 250

## 2019-01-13 MED ORDER — DABIGATRAN ETEXILATE MESYLATE 150 MG PO CAPS
150.0000 mg | ORAL_CAPSULE | Freq: Two times a day (BID) | ORAL | Status: DC
  Administered 2019-01-13 – 2019-01-14 (×2): 150 mg via ORAL
  Filled 2019-01-13 (×3): qty 1

## 2019-01-13 MED ORDER — SODIUM CHLORIDE 0.9 % IV SOLN
INTRAVENOUS | Status: DC
  Administered 2019-01-13: 06:00:00 via INTRAVENOUS

## 2019-01-13 NOTE — ED Notes (Signed)
Waiting for eye drops from pharmacy

## 2019-01-13 NOTE — Progress Notes (Signed)
STROKE TEAM PROGRESS NOTE   INTERVAL HISTORY Pt lying in bed, hard of hearing. She had some difficulty speech and slurry speech yesterday. Also felt some very mild weakness on the left. Stated compliant with eliquis bid, no missing doses. Currently she felt back to baseline.   Vitals:   01/13/19 0830 01/13/19 0930 01/13/19 1044 01/13/19 1200  BP: (!) 150/85 (!) 144/84 (!) 158/94 (!) 151/77  Pulse: 96  (!) 109 70  Resp: 20  (!) 21 (!) 21  Temp:      TempSrc:      SpO2: 95%  96% 96%  Weight:      Height:        CBC:  Recent Labs  Lab 01/12/19 2150 01/12/19 2223  WBC 9.5  --   NEUTROABS 5.4  --   HGB 13.8 15.0  HCT 44.1 44.0  MCV 92.5  --   PLT 254  --     Basic Metabolic Panel:  Recent Labs  Lab 01/12/19 2150 01/12/19 2223  NA 138 139  K 4.5 4.1  CL 102 101  CO2 26  --   GLUCOSE 111* 111*  BUN 19 22  CREATININE 1.01* 1.00  CALCIUM 9.2  --    Lipid Panel:     Component Value Date/Time   CHOL 211 (H) 01/13/2019 0531   TRIG 152 (H) 01/13/2019 0531   HDL 51 01/13/2019 0531   CHOLHDL 4.1 01/13/2019 0531   VLDL 30 01/13/2019 0531   LDLCALC 130 (H) 01/13/2019 0531   HgbA1c:  Lab Results  Component Value Date   HGBA1C 5.9 (H) 01/13/2019   Urine Drug Screen:     Component Value Date/Time   LABOPIA NONE DETECTED 01/12/2019 2303   COCAINSCRNUR NONE DETECTED 01/12/2019 2303   LABBENZ NONE DETECTED 01/12/2019 2303   AMPHETMU NONE DETECTED 01/12/2019 2303   THCU NONE DETECTED 01/12/2019 2303   LABBARB NONE DETECTED 01/12/2019 2303    Alcohol Level     Component Value Date/Time   ETH <10 01/12/2019 2150    IMAGING Ct Angio Head W Or Wo Contrast  Result Date: 01/13/2019 CLINICAL DATA:  Stroke, follow-up EXAM: CT ANGIOGRAPHY HEAD AND NECK TECHNIQUE: Multidetector CT imaging of the head and neck was performed using the standard protocol during bolus administration of intravenous contrast. Multiplanar CT image reconstructions and MIPs were obtained to  evaluate the vascular anatomy. Carotid stenosis measurements (when applicable) are obtained utilizing NASCET criteria, using the distal internal carotid diameter as the denominator. CONTRAST:  42mL OMNIPAQUE IOHEXOL 350 MG/ML SOLN COMPARISON:  MRA head 2015 FINDINGS: CTA NECK FINDINGS Aortic arch: Mild calcified plaque along the aortic arch. Great vessel origins are patent. Right carotid system: Common, internal, and external carotid arteries are patent. There is calcified plaque at the bifurcation. Minimal stenosis of the proximal right ICA. Left carotid system: Common, internal, and external carotid arteries are patent. Mild calcified plaque along common carotid. Calcified plaque at the bifurcation and along the proximal internal carotid causes less than 50% stenosis. Vertebral arteries: Extracranial vertebral arteries are patent. Left vertebral artery is dominant. No significant stenosis or evidence of dissection. Skeleton: Multilevel degenerative changes. Other neck: No neck mass or adenopathy. Upper chest: No apical lung mass. Review of the MIP images confirms the above findings CTA HEAD FINDINGS Anterior circulation: Intracranial internal carotid arteries are patent. Calcified plaque along cavernous and paraclinoid portions causes mild stenosis. Anterior and middle cerebral arteries are patent. Posterior circulation: Intracranial vertebral arteries are patent with minimal  calcified plaque. Basilar artery is patent. Posterior cerebral arteries are patent. Venous sinuses: Patent as permitted by contrast timing. Anatomic variants: Fetal origin of the left PCA. Review of the MIP images confirms the above findings IMPRESSION: No large vessel occlusion. No hemodynamically significant stenosis in the neck. Electronically Signed   By: Macy Mis M.D.   On: 01/13/2019 10:06   Ct Head Wo Contrast  Result Date: 01/12/2019 CLINICAL DATA:  Slurred speech. EXAM: CT HEAD WITHOUT CONTRAST TECHNIQUE: Contiguous axial  images were obtained from the base of the skull through the vertex without intravenous contrast. COMPARISON:  02/03/2014 FINDINGS: Brain: old right posterior parietal infarct, stable. Old left basal ganglia lacunar infarct. There is atrophy and chronic small vessel disease changes. No acute intracranial abnormality. Specifically, no hemorrhage, hydrocephalus, mass lesion, acute infarction, or significant intracranial injury. Vascular: No evidence of aneurysm or adenopathy. Skull: No acute calvarial abnormality. Sinuses/Orbits: No acute finding Other: None IMPRESSION: Old infarcts in the right posterior parietal lobe and left basal ganglia. Atrophy, chronic microvascular disease. No acute intracranial abnormality. Electronically Signed   By: Rolm Baptise M.D.   On: 01/12/2019 23:06   Ct Angio Neck W Or Wo Contrast  Result Date: 01/13/2019 CLINICAL DATA:  Stroke, follow-up EXAM: CT ANGIOGRAPHY HEAD AND NECK TECHNIQUE: Multidetector CT imaging of the head and neck was performed using the standard protocol during bolus administration of intravenous contrast. Multiplanar CT image reconstructions and MIPs were obtained to evaluate the vascular anatomy. Carotid stenosis measurements (when applicable) are obtained utilizing NASCET criteria, using the distal internal carotid diameter as the denominator. CONTRAST:  72mL OMNIPAQUE IOHEXOL 350 MG/ML SOLN COMPARISON:  MRA head 2015 FINDINGS: CTA NECK FINDINGS Aortic arch: Mild calcified plaque along the aortic arch. Great vessel origins are patent. Right carotid system: Common, internal, and external carotid arteries are patent. There is calcified plaque at the bifurcation. Minimal stenosis of the proximal right ICA. Left carotid system: Common, internal, and external carotid arteries are patent. Mild calcified plaque along common carotid. Calcified plaque at the bifurcation and along the proximal internal carotid causes less than 50% stenosis. Vertebral arteries:  Extracranial vertebral arteries are patent. Left vertebral artery is dominant. No significant stenosis or evidence of dissection. Skeleton: Multilevel degenerative changes. Other neck: No neck mass or adenopathy. Upper chest: No apical lung mass. Review of the MIP images confirms the above findings CTA HEAD FINDINGS Anterior circulation: Intracranial internal carotid arteries are patent. Calcified plaque along cavernous and paraclinoid portions causes mild stenosis. Anterior and middle cerebral arteries are patent. Posterior circulation: Intracranial vertebral arteries are patent with minimal calcified plaque. Basilar artery is patent. Posterior cerebral arteries are patent. Venous sinuses: Patent as permitted by contrast timing. Anatomic variants: Fetal origin of the left PCA. Review of the MIP images confirms the above findings IMPRESSION: No large vessel occlusion. No hemodynamically significant stenosis in the neck. Electronically Signed   By: Macy Mis M.D.   On: 01/13/2019 10:06   Mr Brain Wo Contrast  Result Date: 01/13/2019 CLINICAL DATA:  85 year old female with slurred speech, stroke-like symptoms. History of prior stroke with persistent right side deficits. EXAM: MRI HEAD WITHOUT CONTRAST TECHNIQUE: Multiplanar, multiecho pulse sequences of the brain and surrounding structures were obtained without intravenous contrast. COMPARISON:  Brain MRI 02/03/2014.  Head CT 01/12/2019. FINDINGS: Brain: There are several small cortical areas of restricted diffusion scattered from the left operculum (series 5, image 76) to the posterior left temporal and parietal lobes. Trace if any associated T2 and  FLAIR hyperintensity with no hemorrhage or mass effect. No other restricted diffusion. There are chronic lacunar infarcts of the bilateral corona radiata, bilateral basal ganglia, and small chronic cortical infarcts in the bilateral posterior MCA territories (more pronounced on the right series 19 images 14 and  15). There is some hemosiderin associated with the chronic right parietal lobe encephalomalacia. No definite other chronic cerebral blood products. The thalami, brainstem and cerebellum remain within normal limits. No midline shift, mass effect, evidence of mass lesion, ventriculomegaly, extra-axial collection or acute intracranial hemorrhage. Cervicomedullary junction and pituitary are within normal limits. Vascular: Major intracranial vascular flow voids are stable since 2015. Skull and upper cervical spine: Degenerative ligamentous hypertrophy about the odontoid. Otherwise negative visible cervical spine. Visualized bone marrow signal is within normal limits. Sinuses/Orbits: Stable and negative. Other: Mastoids remain clear. Visible internal auditory structures appear normal. Scalp and face soft tissues appear negative. IMPRESSION: 1. Positive for several small scattered cortical infarcts in the left MCA from the operculum posteriorly. No associated hemorrhage or mass effect. 2. Underlying chronic bilateral MCA territory small and medium-sized vessel ischemia. Electronically Signed   By: Genevie Ann M.D.   On: 01/13/2019 02:34    PHYSICAL EXAM  Temp:  [97.5 F (36.4 C)-98.5 F (36.9 C)] 98.5 F (36.9 C) (11/20 1714) Pulse Rate:  [25-143] 67 (11/20 1714) Resp:  [11-36] 20 (11/20 1714) BP: (128-189)/(60-126) 128/63 (11/20 1714) SpO2:  [94 %-98 %] 96 % (11/20 1714) Weight:  [68 kg] 68 kg (11/19 2152)  General - Well nourished, well developed, in no apparent distress.  Ophthalmologic - fundi not visualized due to noncooperation.  Cardiovascular - irregularly irregular heart rate and rhythm.  Mental Status -  Level of arousal and orientation to time, place, and person were intact. Language including expression, naming, repetition, comprehension was assessed and found intact.  Cranial Nerves II - XII - II - decreased visual acuity bilaterally, able to see HW but not able to do FC. Peripheral  vision loss with tunnel vision.  III, IV, VI - Extraocular movements intact. V - Facial sensation intact bilaterally. VII - Facial movement intact bilaterally. VIII - Hearing & vestibular intact bilaterally. X - Palate elevates symmetrically. XI - Chin turning & shoulder shrug intact bilaterally. XII - Tongue protrusion intact.  Motor Strength - The patient's strength was normal in all extremities and pronator drift was absent.  Bulk was normal and fasciculations were absent.   Motor Tone - Muscle tone was assessed at the neck and appendages and was normal.  Reflexes - The patient's reflexes were symmetrical in all extremities and she had no pathological reflexes.  Sensory - Light touch, temperature/pinprick were assessed and were symmetrical.    Coordination - The patient had normal movements in the hands with no ataxia or dysmetria.  Tremor was absent.  Gait and Station - deferred.   ASSESSMENT/PLAN [name] is a 85 y.o. female with history of atrial fibrillation on anticoagulation with Eliquis, left basal ganglia stroke with minimal right-sided hemiparesis, glaucoma and macular degeneration with severe visual loss, arthritis and chronic back pain presenting with transient dysarthria/slurred speech.   Stroke: 3-4 small L MCA cortical infarcts embolic secondary to AF on Eliquis  CT head No acute abnormality. old R posterior parietal and L basal ganglia infarcts.   MRI  several small L MCA cortical infarcts. B MCA small & medium vessel ischemia  CTA head no LVO  CTA neck Unremarkable   2D Echo EF 50-55%  LDL 130  HgbA1c 5.9  IV heparin for VTE prophylaxis  Eliquis (apixaban) daily prior to admission, now on heparin IV. Since pt failed eliquis, will switch to pradaxa.  Therapy recommendations:  pending   Disposition:  pending   Atrial Fibrillation  Home anticoagulation:  Eliquis (apixaban) daily, pt has been compliant  Now on IV heparin  Pt stated  compliance with eliquis . Will switch her from eliquis to pradaxa given eliquis failure   Hx stroke/TIA  01/2014 - Acute left basal ganglia infarct. Probably secondary to small vessel disease. MRA neg. CUS neg. EF 40%. LDL 86 and A1C 6.0. Intolerant to plavix so ASA changed to aggrenox.   Hypertension  Ok to resume home meds  Stable . Permissive hypertension (OK if < 180/105) but gradually normalize in 5-7 days . Long-term BP goal normotensive  Hyperlipidemia  Home meds:  Fish oil and zocor 40  Now on lovaza and lipitor 40  LDL 130, goal < 70  Continue statin at discharge  Other Stroke Risk Factors  Advanced age  Other Active Problems  Possible UTI  ARF  Hypothyroidism on synthroid  Glaucoma and macular degeneration - central vision with only seeing [facility] day # 0  Neurology will sign off. Please call with questions. Pt will follow up with stroke clinic NP at [facility] in about 4 weeks. Thanks for the consult.  Rosalin Hawking, MD PhD Stroke Neurology 01/13/2019 5:34 PM   To contact Stroke Continuity provider, please refer to [email or web address] After hours, contact General Neurology

## 2019-01-13 NOTE — Evaluation (Signed)
Speech Language Pathology Evaluation Patient Details Name: Amanda Cochran MRN: [id number] DOB: 1929-02-08 Today's Date: 01/13/2019 Time: 1500-1540 SLP Time Calculation (min) (ACUTE ONLY): 40 min  Problem List:  Patient Active Problem List   Diagnosis Date Noted  . Acute CVA (cerebrovascular accident) ([place]) 01/13/2019  . Atrial fibrillation ([place]) 11/03/2018  . Left sided lacunar stroke ([place]) 02/14/2014  . Right hemiparesis ([place]) 02/09/2014  . Cerebral infarction ([place]) 02/07/2014  . Hyperlipidemia   . Essential hypertension 02/03/2014   Past Medical History:  Past Medical History:  Diagnosis Date  . Arthritis   . Bladder infection   . CVA (cerebral infarction) 02/07/2014   Left basal ganglia infarct with right hemiparesis   . Glaucoma   . Glaucoma   . Glaucoma (increased eye pressure)   . Hyperlipidemia   . Hypertension   . Left sided lacunar stroke ([place]) 02/14/2014  . Right hemiparesis ([place]) 02/09/2014   Left basal ganglia infarct Dec 2015   . Stroke ([place])   . Thyroid disease    Past Surgical History:  Past Surgical History:  Procedure Laterality Date  . cataract surgery Bilateral 2015  . THYROID SURGERY  1976  . TONSILLECTOMY AND ADENOIDECTOMY    . VAGINAL PROLAPSE REPAIR     HPI:  85yo female admitted 01/12/2019 with dysarthria. PMH: AFib, HTN, HLD, Left basal ganglia CVA (01/2014), hypothyroidism, glaucoma with poor vision, HOH. MRI = Positive for several small scattered cortical infarcts in the left MCA from the operculum posteriorly. No associated hemorrhage or mass effect. Underlying chronic bilateral MCA territory small and medium-sized vessel ischemia.   Assessment / Plan / Recommendation Clinical Impression  Pt presents with poor vision and is significantly hard of hearing. She lives alone, and wants it to stay that way. Pt went to college for 2 years. Mild difficulty noted with thought organization, poor delayed recall of 5 unrelated words. Pt verbalizes safety  awareness of not walking alone at night, and having assistance with balance exercises at home. Pt reports she has a calendar she uses to keep track of appointments, and has intermittent assist from neighbors. Given level of independence prior to admit, along with difficulty with thought organization and recall from today, follow up with home health speech therapy is recommended at DC. No further ST acute intervention recommended at this time. Please reconsult if needs arise.    SLP Assessment  SLP Recommendation/Assessment: All further Speech Language Pathology needs can be addressed in the next venue of care SLP Visit Diagnosis: Cognitive communication deficit (R41.841)    Follow Up Recommendations  Home health SLP       SLP Evaluation Cognition  Overall Cognitive Status: No family/caregiver present to determine baseline cognitive functioning Arousal/Alertness: Awake/alert Orientation Level: Oriented X4 Attention: Focused;Sustained Focused Attention: Appears intact Sustained Attention: Appears intact Memory: Impaired Memory Impairment: Storage deficit;Retrieval deficit;Decreased recall of new information Awareness: Appears intact Problem Solving: Impaired Problem Solving Impairment: Verbal basic Executive Function: Reasoning Reasoning: Impaired Reasoning Impairment: Verbal basic Safety/Judgment: Appears intact       Comprehension  Auditory Comprehension Overall Auditory Comprehension: Appears within functional limits for tasks assessed    Expression Expression Primary Mode of Expression: Verbal Verbal Expression Overall Verbal Expression: Appears within functional limits for tasks assessed   Oral / Motor  Oral Motor/Sensory Function Overall Oral Motor/Sensory Function: Within functional limits Motor Speech Overall Motor Speech: Appears within functional limits for tasks assessed   GO  Carmela Rima, [place] Speech Language Pathologist Office:  [phone]  Shonna Chock 01/13/2019, 3:44 PM

## 2019-01-13 NOTE — Care Management (Signed)
Per Caryn Section. [facility] Ph# [phone] Co-pay amount for Pradaxa  Bid $204.50 for a 30 day supply.  (Pradaxa comes in 75mg ,110mg .150mg  only).  No PA required No Deductible Tier 4 Retail Pharmacy : [facility].

## 2019-01-13 NOTE — ED Notes (Signed)
Patient transported to CT 

## 2019-01-13 NOTE — ED Notes (Signed)
Pt to MRI

## 2019-01-13 NOTE — ED Notes (Signed)
Lunch Tray Order @ 1012.  

## 2019-01-13 NOTE — Progress Notes (Signed)
ANTICOAGULATION CONSULT NOTE  Pharmacy Consult for Heparin>> pradaxa Indication: atrial fibrillation  No Known Allergies  Patient Measurements: Height: 5\' 6"  (167.6 cm) Weight: 150 lb (68 kg) IBW/kg (Calculated) : 59.3  Vital Signs: Temp: 98.5 F (36.9 C) (11/20 1714) Temp Source: Oral (11/20 1714) BP: 128/63 (11/20 1714) Pulse Rate: 67 (11/20 1714)  Labs: Recent Labs    01/12/19 2150 01/12/19 2223 01/13/19 0818  HGB 13.8 15.0  --   HCT 44.1 44.0  --   PLT 254  --   --   APTT 30  --  31  LABPROT 14.2  --  14.1  INR 1.1  --  1.1  HEPARINUNFRC  --   --  1.10*  CREATININE 1.01* 1.00  --     Estimated Creatinine Clearance: 36.4 mL/min (by C-G formula based on SCr of 1 mg/dL).   Medical History: Past Medical History:  Diagnosis Date  . Arthritis   . Bladder infection   . CVA (cerebral infarction) 02/07/2014   Left basal ganglia infarct with right hemiparesis   . Glaucoma   . Glaucoma   . Glaucoma (increased eye pressure)   . Hyperlipidemia   . Hypertension   . Left sided lacunar stroke ([place]) 02/14/2014  . Right hemiparesis ([place]) 02/09/2014   Left basal ganglia infarct Dec 2015   . Stroke ([place])   . Thyroid disease     Medications:  No current facility-administered medications on file prior to encounter.    Current Outpatient Medications on File Prior to Encounter  Medication Sig Dispense Refill  . apixaban (ELIQUIS) 5 MG TABS tablet Take 5 mg by mouth 2 (two) times daily.    Marland Kitchen levothyroxine (SYNTHROID, LEVOTHROID) 100 MCG tablet Take 1 tablet (100 mcg total) by mouth daily. (Patient taking differently: Take 100 mcg by mouth daily before breakfast. ) 30 tablet 1  . amLODipine (NORVASC) 5 MG tablet Take 1 tablet by mouth daily.    . benazepril (LOTENSIN) 20 MG tablet Take 20 mg by mouth daily.    . Cholecalciferol (VITAMIN D) 2000 UNITS CAPS Take 1 capsule by mouth daily.    . dorzolamide-timolol (COSOPT) 22.3-6.8 MG/ML ophthalmic solution Place 1 drop into  both eyes 2 (two) times daily.    . hydrALAZINE (APRESOLINE) 25 MG tablet Take 1 tablet (25 mg total) by mouth every 8 (eight) hours. 90 tablet 1  . latanoprost (XALATAN) 0.005 % ophthalmic solution Place 1 drop into both eyes at bedtime.     . Multiple Vitamins-Minerals (CENTRUM SILVER PO) Take 1 tablet by mouth daily.    . Omega-3 Fatty Acids (FISH OIL) 1000 MG CAPS Take 1,000 mg by mouth daily.     . pilocarpine (PILOCAR) 1 % ophthalmic solution Place 1 drop into both eyes 4 (four) times daily.   10  . simvastatin (ZOCOR) 40 MG tablet Take 40 mg by mouth every evening.      Assessment: 85 y.o. female admitted with CVA, h/o Afib and Eliquis on hold. Pharmacy dosing heparin and plans to transition to Pradaxa (patient failed apixaban) -SCr= 1.0, CrCl ~ 35   Goal of Therapy:  Monitor platelets by anticoagulation protocol: Yes   Plan:  -pradaxa 150mg  po q12h -Will follow patient progress  Hildred Laser, PharmD Clinical Pharmacist **Pharmacist phone directory can now be found on [email or web address] ([email or web address]).  Listed under [place].

## 2019-01-13 NOTE — ED Notes (Addendum)
ED TO INPATIENT HANDOFF REPORT  ED Nurse Name and Phone #: Thurmond Butts [place] Name/Age/Gender Amanda Cochran 85 y.o. female Room/Bed: 010C/010C  Code Status   Code Status: Full Code  Home/SNF/Other Home Patient oriented to: self, place, time and situation Is this baseline? Yes   Triage Complete: Triage complete  Chief Complaint Stroke Sx  Triage Note Pt came in [facility] with c/o of stroke symptoms. Pt called family around 2030 and they noticed the slurried speech. EMS arrived and pt was not having slurried speech at that time. Pt does have R Sided Weakness d/t a stroke 5 Years ago.  -Hard Of Hearing -Vision Problems -On Elequis - Hx. Afib  107CBG, 97%RA, HR96, 171/92    Allergies No Known Allergies  Level of Care/Admitting Diagnosis ED Disposition    ED Disposition Condition [facility] Area: [place] [[id number]]  Level of Care: Telemetry Medical [104]  I expect the patient will be discharged within 24 hours: No (not a candidate for 5C-Observation unit)  Covid Evaluation: Asymptomatic Screening Protocol (No Symptoms)  Diagnosis: Acute CVA (cerebrovascular accident) [id number]  Admitting Physician: Rise Patience [phone]  Attending Physician: Rise Patience Lei.Right  PT Class (Do Not Modify): Observation [104]  PT Acc Code (Do Not Modify): Observation [10022]       B Medical/Surgery History Past Medical History:  Diagnosis Date  . Arthritis   . Bladder infection   . CVA (cerebral infarction) 02/07/2014   Left basal ganglia infarct with right hemiparesis   . Glaucoma   . Glaucoma   . Glaucoma (increased eye pressure)   . Hyperlipidemia   . Hypertension   . Left sided lacunar stroke ([place]) 02/14/2014  . Right hemiparesis ([place]) 02/09/2014   Left basal ganglia infarct Dec 2015   . Stroke ([place])   . Thyroid disease    Past Surgical History:  Procedure Laterality Date  . cataract surgery Bilateral 2015  . THYROID  SURGERY  1976  . TONSILLECTOMY AND ADENOIDECTOMY    . VAGINAL PROLAPSE REPAIR       A IV Location/Drains/Wounds Patient Lines/Drains/Airways Status   Active Line/Drains/Airways    Name:   Placement date:   Placement time:   Site:   Days:   Peripheral IV 01/13/19 Left Antecubital   01/13/19    0912    Antecubital   less than 1          Intake/Output Last 24 hours  Intake/Output Summary (Last 24 hours) at 01/13/2019 1147 Last data filed at 01/13/2019 0536 Gross per 24 hour  Intake -  Output 900 ml  Net -900 ml    Labs/Imaging Results for orders placed or performed during the hospital encounter of 01/12/19 (from the past 48 hour(s))  Ethanol     Status: None   Collection Time: 01/12/19  9:50 PM  Result Value Ref Range   Alcohol, Ethyl (B) <10 <10 mg/dL    Comment: (NOTE) Lowest detectable limit for serum alcohol is 10 mg/dL. For medical purposes only. Performed at [facility], [place] [address] [place], [place] [address]   Protime-INR     Status: None   Collection Time: 01/12/19  9:50 PM  Result Value Ref Range   Prothrombin Time 14.2 11.4 - 15.2 seconds   INR 1.1 0.8 - 1.2    Comment: (NOTE) INR goal varies based on device and disease states. Performed at [facility], [place] [address],  [place], [place] [address]   APTT     Status: None   Collection Time: 01/12/19  9:50 PM  Result Value Ref Range   aPTT 30 24 - 36 seconds    Comment: Performed at [facility] [address], [place] [address]  CBC     Status: None   Collection Time: 01/12/19  9:50 PM  Result Value Ref Range   WBC 9.5 4.0 - 10.5 K/uL   RBC 4.77 3.87 - 5.11 MIL/uL   Hemoglobin 13.8 12.0 - 15.0 g/dL   HCT 44.1 36.0 - 46.0 %   MCV 92.5 80.0 - 100.0 fL   MCH 28.9 26.0 - 34.0 pg   MCHC 31.3 30.0 - 36.0 g/dL   RDW 14.4 11.5 - 15.5 %   Platelets 254 150 - 400 K/uL   nRBC 0.0 0.0 - 0.2 %    Comment: Performed at [facility], [place] [address], [place], [place]  [address]  Differential     Status: None   Collection Time: 01/12/19  9:50 PM  Result Value Ref Range   Neutrophils Relative % 56 %   Neutro Abs 5.4 1.7 - 7.7 K/uL   Lymphocytes Relative 33 %   Lymphs Abs 3.1 0.7 - 4.0 K/uL   Monocytes Relative 8 %   Monocytes Absolute 0.7 0.1 - 1.0 K/uL   Eosinophils Relative 2 %   Eosinophils Absolute 0.2 0.0 - 0.5 K/uL   Basophils Relative 1 %   Basophils Absolute 0.1 0.0 - 0.1 K/uL   Immature Granulocytes 0 %   Abs Immature Granulocytes 0.03 0.00 - 0.07 K/uL    Comment: Performed at [facility] [address] [place], [place] [address]  Comprehensive metabolic panel     Status: Abnormal   Collection Time: 01/12/19  9:50 PM  Result Value Ref Range   Sodium 138 135 - 145 mmol/L   Potassium 4.5 3.5 - 5.1 mmol/L   Chloride 102 98 - 111 mmol/L   CO2 26 22 - 32 mmol/L   Glucose, Bld 111 (H) 70 - 99 mg/dL   BUN 19 8 - 23 mg/dL   Creatinine, Ser 1.01 (H) 0.44 - 1.00 mg/dL   Calcium 9.2 8.9 - 10.3 mg/dL   Total Protein 6.8 6.5 - 8.1 g/dL   Albumin 3.7 3.5 - 5.0 g/dL   AST 27 15 - 41 U/L   ALT 10 0 - 44 U/L   Alkaline Phosphatase 55 38 - 126 U/L   Total Bilirubin 1.0 0.3 - 1.2 mg/dL   GFR calc non Af Amer 50 (L) >60 mL/min   GFR calc Af Amer 58 (L) >60 mL/min   Anion gap 10 5 - 15    Comment: Performed at [facility] [address], [place], [place] [address]  I-stat chem 8, ED     Status: Abnormal   Collection Time: 01/12/19 10:23 PM  Result Value Ref Range   Sodium 139 135 - 145 mmol/L   Potassium 4.1 3.5 - 5.1 mmol/L   Chloride 101 98 - 111 mmol/L   BUN 22 8 - 23 mg/dL   Creatinine, Ser 1.00 0.44 - 1.00 mg/dL   Glucose, Bld 111 (H) 70 - 99 mg/dL   Calcium, Ion 1.17 1.15 - 1.40 mmol/L   TCO2 28 22 - 32 mmol/L   Hemoglobin 15.0 12.0 - 15.0 g/dL   HCT 44.0 36.0 - 46.0 %  Urine rapid drug screen (hosp  performed)     Status: None   Collection Time: 01/12/19 11:03 PM  Result Value Ref Range   Opiates NONE DETECTED NONE  DETECTED   Cocaine NONE DETECTED NONE DETECTED   Benzodiazepines NONE DETECTED NONE DETECTED   Amphetamines NONE DETECTED NONE DETECTED   Tetrahydrocannabinol NONE DETECTED NONE DETECTED   Barbiturates NONE DETECTED NONE DETECTED    Comment: (NOTE) DRUG SCREEN FOR MEDICAL PURPOSES ONLY.  IF CONFIRMATION IS NEEDED FOR ANY PURPOSE, NOTIFY LAB WITHIN 5 DAYS. LOWEST DETECTABLE LIMITS FOR URINE DRUG SCREEN Drug Class                     Cutoff (ng/mL) Amphetamine and metabolites    1000 Barbiturate and metabolites    200 Benzodiazepine                 [id number] Tricyclics and metabolites     300 Opiates and metabolites        300 Cocaine and metabolites        300 THC                            50 Performed at [facility], [place] [address] [place], [place] [address]   Urinalysis, Routine w reflex microscopic     Status: Abnormal   Collection Time: 01/12/19 11:03 PM  Result Value Ref Range   Color, Urine YELLOW YELLOW   APPearance CLEAR CLEAR   Specific Gravity, Urine 1.009 1.005 - 1.030   pH 8.0 5.0 - 8.0   Glucose, UA NEGATIVE NEGATIVE mg/dL   Hgb urine dipstick NEGATIVE NEGATIVE   Bilirubin Urine NEGATIVE NEGATIVE   Ketones, ur NEGATIVE NEGATIVE mg/dL   Protein, ur NEGATIVE NEGATIVE mg/dL   Nitrite NEGATIVE NEGATIVE   Leukocytes,Ua LARGE (A) NEGATIVE   RBC / HPF 0-5 0 - 5 RBC/hpf   WBC, UA 21-50 0 - 5 WBC/hpf   Bacteria, UA NONE SEEN NONE SEEN   Squamous Epithelial / LPF 0-5 0 - 5   Amorphous Crystal PRESENT     Comment: Performed at [facility], [place] [address] [place], [place] [address]  Hemoglobin A1c     Status: Abnormal   Collection Time: 01/13/19  5:31 AM  Result Value Ref Range   Hgb A1c MFr Bld 5.9 (H) 4.8 - 5.6 %    Comment: (NOTE) Pre diabetes:          5.7%-6.4% Diabetes:              >6.4% Glycemic control for   <7.0% adults with diabetes    Mean Plasma Glucose 122.63 mg/dL    Comment: Performed at [facility] [address]  [place], [place] [address]  Lipid panel     Status: Abnormal   Collection Time: 01/13/19  5:31 AM  Result Value Ref Range   Cholesterol 211 (H) 0 - 200 mg/dL   Triglycerides 152 (H) <150 mg/dL   HDL 51 >40 mg/dL   Total CHOL/HDL Ratio 4.1 RATIO   VLDL 30 0 - 40 mg/dL   LDL Cholesterol 130 (H) 0 - 99 mg/dL    Comment:        Total Cholesterol/HDL:CHD Risk Coronary Heart Disease Risk Table                     Men   Women  1/2 Average Risk   3.4  3.3  Average Risk       5.0   4.4  2 X Average Risk   9.6   7.1  3 X Average Risk  23.4   11.0        Use the calculated Patient Ratio above and the CHD Risk Table to determine the patient's CHD Risk.        ATP III CLASSIFICATION (LDL):  <100     mg/dL   Optimal  100-129  mg/dL   Near or Above                    Optimal  130-159  mg/dL   Borderline  160-189  mg/dL   High  >190     mg/dL   Very High Performed at [facility] [address], [place], [place] [address]   APTT     Status: None   Collection Time: 01/13/19  8:18 AM  Result Value Ref Range   aPTT 31 24 - 36 seconds    Comment: Performed at [facility] [address] [place], [place] [address]  Protime-INR     Status: None   Collection Time: 01/13/19  8:18 AM  Result Value Ref Range   Prothrombin Time 14.1 11.4 - 15.2 seconds   INR 1.1 0.8 - 1.2    Comment: (NOTE) INR goal varies based on device and disease states. Performed at [facility], [place] [address] [place], [place] [address]   Heparin level (unfractionated)     Status: Abnormal   Collection Time: 01/13/19  8:18 AM  Result Value Ref Range   Heparin Unfractionated 1.10 (H) 0.30 - 0.70 IU/mL    Comment: RESULTS CONFIRMED BY MANUAL DILUTION (NOTE) If heparin results are below expected values, and patient dosage has  been confirmed, suggest follow up testing of antithrombin III levels. Performed at [facility], [place] [address], [place], [place] [address]    Ct Angio Head W Or Wo  Contrast  Result Date: 01/13/2019 CLINICAL DATA:  Stroke, follow-up EXAM: CT ANGIOGRAPHY HEAD AND NECK TECHNIQUE: Multidetector CT imaging of the head and neck was performed using the standard protocol during bolus administration of intravenous contrast. Multiplanar CT image reconstructions and MIPs were obtained to evaluate the vascular anatomy. Carotid stenosis measurements (when applicable) are obtained utilizing NASCET criteria, using the distal internal carotid diameter as the denominator. CONTRAST:  87mL OMNIPAQUE IOHEXOL 350 MG/ML SOLN COMPARISON:  MRA head 2015 FINDINGS: CTA NECK FINDINGS Aortic arch: Mild calcified plaque along the aortic arch. Great vessel origins are patent. Right carotid system: Common, internal, and external carotid arteries are patent. There is calcified plaque at the bifurcation. Minimal stenosis of the proximal right ICA. Left carotid system: Common, internal, and external carotid arteries are patent. Mild calcified plaque along common carotid. Calcified plaque at the bifurcation and along the proximal internal carotid causes less than 50% stenosis. Vertebral arteries: Extracranial vertebral arteries are patent. Left vertebral artery is dominant. No significant stenosis or evidence of dissection. Skeleton: Multilevel degenerative changes. Other neck: No neck mass or adenopathy. Upper chest: No apical lung mass. Review of the MIP images confirms the above findings CTA HEAD FINDINGS Anterior circulation: Intracranial internal carotid arteries are patent. Calcified plaque along cavernous and paraclinoid portions causes mild stenosis. Anterior and middle cerebral arteries are patent. Posterior circulation: Intracranial vertebral arteries are patent with minimal calcified plaque. Basilar artery is patent. Posterior cerebral arteries are patent. Venous sinuses:  Patent as permitted by contrast timing. Anatomic variants: Fetal origin of the left PCA. Review of the MIP images confirms the  above findings IMPRESSION: No large vessel occlusion. No hemodynamically significant stenosis in the neck. Electronically Signed   By: Macy Mis M.D.   On: 01/13/2019 10:06   Ct Head Wo Contrast  Result Date: 01/12/2019 CLINICAL DATA:  Slurred speech. EXAM: CT HEAD WITHOUT CONTRAST TECHNIQUE: Contiguous axial images were obtained from the base of the skull through the vertex without intravenous contrast. COMPARISON:  02/03/2014 FINDINGS: Brain: old right posterior parietal infarct, stable. Old left basal ganglia lacunar infarct. There is atrophy and chronic small vessel disease changes. No acute intracranial abnormality. Specifically, no hemorrhage, hydrocephalus, mass lesion, acute infarction, or significant intracranial injury. Vascular: No evidence of aneurysm or adenopathy. Skull: No acute calvarial abnormality. Sinuses/Orbits: No acute finding Other: None IMPRESSION: Old infarcts in the right posterior parietal lobe and left basal ganglia. Atrophy, chronic microvascular disease. No acute intracranial abnormality. Electronically Signed   By: Rolm Baptise M.D.   On: 01/12/2019 23:06   Ct Angio Neck W Or Wo Contrast  Result Date: 01/13/2019 CLINICAL DATA:  Stroke, follow-up EXAM: CT ANGIOGRAPHY HEAD AND NECK TECHNIQUE: Multidetector CT imaging of the head and neck was performed using the standard protocol during bolus administration of intravenous contrast. Multiplanar CT image reconstructions and MIPs were obtained to evaluate the vascular anatomy. Carotid stenosis measurements (when applicable) are obtained utilizing NASCET criteria, using the distal internal carotid diameter as the denominator. CONTRAST:  36mL OMNIPAQUE IOHEXOL 350 MG/ML SOLN COMPARISON:  MRA head 2015 FINDINGS: CTA NECK FINDINGS Aortic arch: Mild calcified plaque along the aortic arch. Great vessel origins are patent. Right carotid system: Common, internal, and external carotid arteries are patent. There is calcified plaque at  the bifurcation. Minimal stenosis of the proximal right ICA. Left carotid system: Common, internal, and external carotid arteries are patent. Mild calcified plaque along common carotid. Calcified plaque at the bifurcation and along the proximal internal carotid causes less than 50% stenosis. Vertebral arteries: Extracranial vertebral arteries are patent. Left vertebral artery is dominant. No significant stenosis or evidence of dissection. Skeleton: Multilevel degenerative changes. Other neck: No neck mass or adenopathy. Upper chest: No apical lung mass. Review of the MIP images confirms the above findings CTA HEAD FINDINGS Anterior circulation: Intracranial internal carotid arteries are patent. Calcified plaque along cavernous and paraclinoid portions causes mild stenosis. Anterior and middle cerebral arteries are patent. Posterior circulation: Intracranial vertebral arteries are patent with minimal calcified plaque. Basilar artery is patent. Posterior cerebral arteries are patent. Venous sinuses: Patent as permitted by contrast timing. Anatomic variants: Fetal origin of the left PCA. Review of the MIP images confirms the above findings IMPRESSION: No large vessel occlusion. No hemodynamically significant stenosis in the neck. Electronically Signed   By: Macy Mis M.D.   On: 01/13/2019 10:06   Mr Brain Wo Contrast  Result Date: 01/13/2019 CLINICAL DATA:  85 year old female with slurred speech, stroke-like symptoms. History of prior stroke with persistent right side deficits. EXAM: MRI HEAD WITHOUT CONTRAST TECHNIQUE: Multiplanar, multiecho pulse sequences of the brain and surrounding structures were obtained without intravenous contrast. COMPARISON:  Brain MRI 02/03/2014.  Head CT 01/12/2019. FINDINGS: Brain: There are several small cortical areas of restricted diffusion scattered from the left operculum (series 5, image 76) to the posterior left temporal and parietal lobes. Trace if any associated T2 and  FLAIR hyperintensity with no hemorrhage or mass effect. No other restricted diffusion.  There are chronic lacunar infarcts of the bilateral corona radiata, bilateral basal ganglia, and small chronic cortical infarcts in the bilateral posterior MCA territories (more pronounced on the right series 19 images 14 and 15). There is some hemosiderin associated with the chronic right parietal lobe encephalomalacia. No definite other chronic cerebral blood products. The thalami, brainstem and cerebellum remain within normal limits. No midline shift, mass effect, evidence of mass lesion, ventriculomegaly, extra-axial collection or acute intracranial hemorrhage. Cervicomedullary junction and pituitary are within normal limits. Vascular: Major intracranial vascular flow voids are stable since 2015. Skull and upper cervical spine: Degenerative ligamentous hypertrophy about the odontoid. Otherwise negative visible cervical spine. Visualized bone marrow signal is within normal limits. Sinuses/Orbits: Stable and negative. Other: Mastoids remain clear. Visible internal auditory structures appear normal. Scalp and face soft tissues appear negative. IMPRESSION: 1. Positive for several small scattered cortical infarcts in the left MCA from the operculum posteriorly. No associated hemorrhage or mass effect. 2. Underlying chronic bilateral MCA territory small and medium-sized vessel ischemia. Electronically Signed   By: Genevie Ann M.D.   On: 01/13/2019 02:34    Pending Labs Unresulted Labs (From admission, onward)    Start     Ordered   01/14/19 0500  Heparin level (unfractionated)  Daily,   R     01/13/19 0813   01/14/19 0500  CBC9  Daily,   R     01/13/19 0813   01/14/19 0500  APTT  Daily,   R     01/13/19 0813   01/13/19 1830  APTT  Once-Timed,   STAT     01/13/19 1001   01/13/19 1830  Heparin level (unfractionated)  Once-Timed,   STAT     01/13/19 1001   01/13/19 0309  SARS CORONAVIRUS 2 (TAT 6-24 HRS) Nasopharyngeal  Nasopharyngeal Swab  (Asymptomatic/Tier 3)  Once,   STAT    Question Answer Comment  Is this test for diagnosis or screening Screening   Symptomatic for COVID-19 as defined by CDC No   Hospitalized for COVID-19 No   Admitted to ICU for COVID-19 No   Previously tested for COVID-19 No   Resident in a congregate (group) care setting No   Employed in healthcare setting No   Pregnant No      01/13/19 0308   01/13/19 0306  Urine culture  ONCE - STAT,   STAT     01/13/19 0305          Vitals/Pain Today's Vitals   01/13/19 0830 01/13/19 0921 01/13/19 0930 01/13/19 1044  BP: (!) 150/85  (!) 144/84 (!) 158/94  Pulse: 96   (!) 109  Resp: 20   (!) 21  Temp:      TempSrc:      SpO2: 95%   96%  Weight:      Height:      PainSc:  0-No pain      Isolation Precautions No active isolations  Medications Medications  cephALEXin (KEFLEX) capsule 500 mg (500 mg Oral Given 01/13/19 1058)  amLODipine (NORVASC) tablet 5 mg (5 mg Oral Given 01/13/19 1058)  benazepril (LOTENSIN) tablet 20 mg (20 mg Oral Given 01/13/19 1058)  hydrALAZINE (APRESOLINE) tablet 25 mg (25 mg Oral Given 01/13/19 0531)  levothyroxine (SYNTHROID) tablet 100 mcg (100 mcg Oral Given 01/13/19 0854)  dorzolamide-timolol (COSOPT) 22.3-6.8 MG/ML ophthalmic solution 1 drop (has no administration in time range)  omega-3 acid ethyl esters (LOVAZA) capsule 1 g (1 g Oral Given 01/13/19 1100)  latanoprost (  XALATAN) 0.005 % ophthalmic solution 1 drop (has no administration in time range)  pilocarpine (PILOCAR) 1 % ophthalmic solution 1 drop (has no administration in time range)  0.9 %  sodium chloride infusion ( Intravenous Rate/Dose Verify 01/13/19 1046)  acetaminophen (TYLENOL) tablet 650 mg (has no administration in time range)    Or  acetaminophen (TYLENOL) 160 MG/5ML solution 650 mg (has no administration in time range)    Or  acetaminophen (TYLENOL) suppository 650 mg (has no administration in time range)  atorvastatin  (LIPITOR) tablet 40 mg (has no administration in time range)  heparin ADULT infusion 100 units/mL (25000 units/278mL sodium chloride 0.45%) (750 Units/hr Intravenous New Bag/Given 01/13/19 1058)   stroke: mapping our early stages of recovery book ( Does not apply Given 01/13/19 0534)  iohexol (OMNIPAQUE) 350 MG/ML injection 75 mL (75 mLs Intravenous Contrast Given 01/13/19 0944)    Mobility walks with device Low fall risk   Focused Assessments    R Recommendations: See Admitting Provider Note  Report given to: 3W RN  Additional Notes:

## 2019-01-13 NOTE — Progress Notes (Signed)
PT Cancellation Note  Patient Details Name: AMMIE MENZEL MRN: [id number] DOB: [birth year]   Cancelled Treatment:    Reason Eval/Treat Not Completed: Patient declined, no reason specified patient had just received breakfast tray, requests PT come back later. Will attempt to return if time/schedule allow.    Windell Norfolk, DPT, PN1   Supplemental Physical Therapist [facility]    Pager [phone] Acute Rehab Office [phone]

## 2019-01-13 NOTE — Progress Notes (Signed)
ANTICOAGULATION CONSULT NOTE - Initial Consult  Pharmacy Consult for Heparin Indication: atrial fibrillation  No Known Allergies  Patient Measurements: Height: 5\' 6"  (167.6 cm) Weight: 150 lb (68 kg) IBW/kg (Calculated) : 59.3  Vital Signs: Temp: 97.5 F (36.4 C) (11/19 2147) Temp Source: Oral (11/19 2147) BP: 143/105 (11/20 0745) Pulse Rate: 94 (11/20 0745)  Labs: Recent Labs    01/12/19 2150 01/12/19 2223  HGB 13.8 15.0  HCT 44.1 44.0  PLT 254  --   APTT 30  --   LABPROT 14.2  --   INR 1.1  --   CREATININE 1.01* 1.00    Estimated Creatinine Clearance: 36.4 mL/min (by C-G formula based on SCr of 1 mg/dL).   Medical History: Past Medical History:  Diagnosis Date  . Arthritis   . Bladder infection   . CVA (cerebral infarction) 02/07/2014   Left basal ganglia infarct with right hemiparesis   . Glaucoma   . Glaucoma   . Glaucoma (increased eye pressure)   . Hyperlipidemia   . Hypertension   . Left sided lacunar stroke ([place]) 02/14/2014  . Right hemiparesis ([place]) 02/09/2014   Left basal ganglia infarct Dec 2015   . Stroke ([place])   . Thyroid disease     Medications:  Current Facility-Administered Medications on File Prior to Encounter  Medication Dose Route Frequency Provider Last Rate Last Dose  . lidocaine (PF) (XYLOCAINE) 1 % injection 2 mL  2 mL Other Once Magnus Sinning, MD       Current Outpatient Medications on File Prior to Encounter  Medication Sig Dispense Refill  . apixaban (ELIQUIS) 5 MG TABS tablet Take 5 mg by mouth 2 (two) times daily.    Marland Kitchen levothyroxine (SYNTHROID, LEVOTHROID) 100 MCG tablet Take 1 tablet (100 mcg total) by mouth daily. (Patient taking differently: Take 100 mcg by mouth daily before breakfast. ) 30 tablet 1  . amLODipine (NORVASC) 5 MG tablet Take 1 tablet by mouth daily.    . benazepril (LOTENSIN) 20 MG tablet Take 20 mg by mouth daily.    . Cholecalciferol (VITAMIN D) 2000 UNITS CAPS Take 1 capsule by mouth daily.    .  dorzolamide-timolol (COSOPT) 22.3-6.8 MG/ML ophthalmic solution Place 1 drop into both eyes 2 (two) times daily.    . hydrALAZINE (APRESOLINE) 25 MG tablet Take 1 tablet (25 mg total) by mouth every 8 (eight) hours. 90 tablet 1  . latanoprost (XALATAN) 0.005 % ophthalmic solution Place 1 drop into both eyes at bedtime.     . Multiple Vitamins-Minerals (CENTRUM SILVER PO) Take 1 tablet by mouth daily.    . Omega-3 Fatty Acids (FISH OIL) 1000 MG CAPS Take 1,000 mg by mouth daily.     . pilocarpine (PILOCAR) 1 % ophthalmic solution Place 1 drop into both eyes 4 (four) times daily.   10  . simvastatin (ZOCOR) 40 MG tablet Take 40 mg by mouth every evening.      Assessment: 85 y.o. female admitted with CVA, h/o Afib and Eliquis on hold, for heparin   Goal of Therapy:  APTT 66-85 Heparin level 0.3-0.5 units/mL Monitor platelets by anticoagulation protocol: Yes   Plan:  After baseline anticoagulation labs drawn, start heparin 750 units/hr APTT in 8 hours  , Bronson Curb 01/13/2019,8:10 AM

## 2019-01-13 NOTE — ED Notes (Signed)
Echo at the bedside °

## 2019-01-13 NOTE — ED Notes (Signed)
Pt niece [name] [phone]

## 2019-01-13 NOTE — Progress Notes (Signed)
PT Cancellation Note  Patient Details Name: Amanda Cochran MRN: [id number] DOB: 07/29/1928   Cancelled Treatment:    Reason Eval/Treat Not Completed: Patient declined, no reason specified patient refuses PT, stating "I've been doing it every day for the past 5 years and I don't feel like it!" Educated on importance of therapy and role in helping to facilitate DC, patient continues to refuse. Will attempt back on next date of service at this point.    Windell Norfolk, DPT, PN1   Supplemental Physical Therapist [facility]    Pager [phone] Acute Rehab Office [phone]

## 2019-01-13 NOTE — Evaluation (Signed)
Occupational Therapy Evaluation Patient Details Name: Amanda Cochran MRN: [id number] DOB: [birth year] Today's Date: 01/13/2019    History of Present Illness Amanda Cochran is a 85 y.o. female with history of A. fib on Eliquis, hypertension, hyperlipidemia previous stroke, hypothyroidism last evening around 7 PM was noticed to have some slurred speech while she was talking to her friend over the phone. [id number] for several small scattered cortical infarcts in the left MCA from the operculum posteriorly, pt in Afib   Clinical Impression   This 85 yo female admitted with above presents to acute OT with mild decrease in balance and she reports she feels weaker than normal as well as glaucoma that she reports with vision worsening over the last month. She will benefit from acute OT with follow at [facility].    Follow Up Recommendations  Home health OT;Other (comment)([facility] program through [place] for assistance with AE for low vision)    Equipment Recommendations  None recommended by OT       Precautions / Restrictions Precautions Precautions: Fall Restrictions Weight Bearing Restrictions: No      Mobility Bed Mobility Overal bed mobility: Independent                Transfers Overall transfer level: Needs assistance Equipment used: None Transfers: Sit to/from Stand Sit to Stand: Min guard         General transfer comment: min guard A to walk around in hallway with tennis shoes on. Could have gone further (went 75 feet x2)but Afib with increase in rate up to 141    Balance Overall balance assessment: Needs assistance Sitting-balance support: No upper extremity supported Sitting balance-Leahy Scale: Good     Standing balance support: No upper extremity supported Standing balance-Leahy Scale: Fair                             ADL either performed or assessed with clinical judgement   ADL Overall ADL's : Needs assistance/impaired Eating/Feeding:  Independent   Grooming: Supervision/safety;Standing   Upper Body Bathing: Supervision/ safety;Standing   Lower Body Bathing: Sit to/from stand;Min guard   Upper Body Dressing : Supervision/safety;Standing   Lower Body Dressing: Min guard;Sit to/from stand   Toilet Transfer: Min guard;Ambulation Toilet Transfer Details (indicate cue type and reason): No AD Toileting- Clothing Manipulation and Hygiene: Min guard;Sit to/from stand               Vision Baseline Vision/History: Glaucoma Patient Visual Report: No change from baseline Additional Comments: Pt does report her vision has gotten worse over the last month, uses magnifying glass to read what she has to, counts steps going up and down            Pertinent Vitals/Pain Pain Assessment: No/denies pain     Hand Dominance Right   Extremity/Trunk Assessment Upper Extremity Assessment Upper Extremity Assessment: Overall WFL for tasks assessed           Communication Communication Communication: HOH   Cognition Arousal/Alertness: Awake/alert Behavior During Therapy: WFL for tasks assessed/performed Overall Cognitive Status: Within Functional Limits for tasks assessed                                                Home Living Family/patient expects to be discharged to:: Private residence Living Arrangements:  Alone Available Help at Discharge: Neighbor;Friend(s);Available PRN/intermittently Type of Home: House Home Access: Stairs to enter [employer] of Steps: 3 Entrance Stairs-Rails: Right;Left Home Layout: Two level;Bed/bath upstairs Alternate Level Stairs-Number of Steps: 7 Alternate Level Stairs-Rails: Left Bathroom Shower/Tub: Tub/shower unit;Curtain   Bathroom Toilet: Handicapped height     Home Equipment: Shower seat;Bedside commode;Walker - 2 wheels;Kasandra Knudsen - single point      Lives With: Alone    Prior Functioning/Environment          Comments: Pt has had  services for the Blind before and she was not impressed        OT Problem List: Impaired balance (sitting and/or standing);Impaired vision/perception      OT Treatment/Interventions: Self-care/ADL training;DME and/or AE instruction;Patient/family education;Balance training    OT Goals(Current goals can be found in the care plan section) Acute Rehab OT Goals Patient Stated Goal: to be able to go home OT Goal Formulation: With patient Time For Goal Achievement: 01/26/19 Potential to Achieve Goals: Good  OT Frequency: Min 2X/week              AM-PAC OT "6 Clicks" Daily Activity     Outcome Measure Help from another person eating meals?: None Help from another person taking care of personal grooming?: A Little Help from another person toileting, which includes using toliet, bedpan, or urinal?: A Little Help from another person bathing (including washing, rinsing, drying)?: A Little Help from another person to put on and taking off regular upper body clothing?: A Little Help from another person to put on and taking off regular lower body clothing?: A Little 6 Click Score: 19   End of Session Equipment Utilized During Treatment: Gait belt Nurse Communication: (HR  up into 140s with ambulation)  Activity Tolerance: Patient tolerated treatment well Patient left: in bed;with call bell/phone within reach;with chair alarm set  OT Visit Diagnosis: Unsteadiness on feet (R26.81);Low vision, both eyes (H54.2)                Time: [id number] OT Time Calculation (min): 42 min Charges:  OT General Charges $OT Visit: 1 Visit OT Evaluation $OT Eval Moderate Complexity: 1 Mod OT Treatments $Self Care/Home Management : 23-37 mins  [place] Circle, OTR/L Acute [employer] Pager [phone] Office [phone]     Almon Register 01/13/2019, 4:55 PM

## 2019-01-13 NOTE — Progress Notes (Signed)
Pt arrived unit safely 

## 2019-01-13 NOTE — Progress Notes (Signed)
PROGRESS NOTE    Amanda Cochran  [id number] DOB: Jan 02, 1929 DOA: 01/12/2019 PCP: Crist Infante, MD    Brief Narrative:  85 y.o. female with history of A. fib on Eliquis, hypertension, hyperlipidemia previous stroke, hypothyroidism last evening around 7 PM was noticed to have some slurred speech while she was talking to her friend over the phone.  EMS was called and patient was found to have slurred speech and was brought to the ER.  Patient denies any weakness of upper or lower extremity or any difficulty swallowing.  Patient has poor vision from glaucoma.  By the time patient is to ER symptoms have improved.  Prior to this patient states she has not been feeling well for last 2 days.  ED Course: In the ER patient symptoms are improved and on all extremities patient has good strength.  Patient passed swallow.  MRI of the brain done shows scattered left MCA territory acute stroke.  Neurology on-call was consulted.  Patient admitted for further management of acute CVA.  UA shows possibility of UTI.  EKG shows A. fib rate around 101 bpm.  Creatinine 1 hemoglobin 13.8 platelets 254 COVID-19 test is pending.  Assessment & Plan:   Principal Problem:   Acute CVA (cerebrovascular accident) [facility]) Active Problems:   Essential hypertension   Atrial fibrillation ([place])  1. Acute CVA 1. Reportedly compliant with her Eliquis 2. MRI pos for multiple scattered cortical infarcts in L MCVA from the operculum posteriorly 3. Neurology/Stroke Team following, follow up on recs 4. Continue statin. Continued on heparin gtt for now 5. 2d echo with normal LVEF 6. CT angiogram of the head and neck reviewed. No large vessel occlusion on head and neck 2. A. fib presently on heparin. 1. Currently rate controlled 2. On heparin gtt 3. Hypertension  1. Currently continued on ACE inhibitor hydralazine and Norvasc. 2. BP thus far stable 4. UTI 1. now on Keflex 2. Follow up on cultures. 5. Acute renal  failure 1.  Had been continued on gentle IVF hydration 2. Cr stable 3. Pt voiding well. Will hold further IVF and repeat bmet in AM 6. Hypothyroidism  1. Continue on Synthroid  DVT prophylaxis: Heparin gtt Code Status: Full Family Communication: Pt in room, family not at bedside Disposition Plan: Uncertain at this time  Consultants:   Neurology  Procedures:     Antimicrobials: Anti-infectives (From admission, onward)   Start     Dose/Rate Route Frequency Ordered Stop   01/13/19 0315  cephALEXin (KEFLEX) capsule 500 mg     500 mg Oral Every 12 hours 01/13/19 0304         Subjective: Without complaints at this time  Objective: Vitals:   01/13/19 0830 01/13/19 0930 01/13/19 1044 01/13/19 1200  BP: (!) 150/85 (!) 144/84 (!) 158/94 (!) 151/77  Pulse: 96  (!) 109 70  Resp: 20  (!) 21 (!) 21  Temp:      TempSrc:      SpO2: 95%  96% 96%  Weight:      Height:        Intake/Output Summary (Last 24 hours) at 01/13/2019 1657 Last data filed at 01/13/2019 0536 Gross per 24 hour  Intake --  Output 900 ml  Net -900 ml   Filed Weights   01/12/19 2152  Weight: 68 kg    Examination:  General exam: Appears calm and comfortable  Respiratory system: Clear to auscultation. Respiratory effort normal. Cardiovascular system: S1 & S2 heard, regular Gastrointestinal system:  Abdomen is nondistended, soft and nontender. No organomegaly or masses felt. Normal bowel sounds heard. Central nervous system: Alert and oriented. No focal neurological deficits. Extremities: Symmetric 5 x 5 power. Skin: No rashes, lesions  Psychiatry: Judgement and insight appear normal. Mood & affect appropriate.   Data Reviewed: I have personally reviewed following labs and imaging studies  CBC: Recent Labs  Lab 01/12/19 2150 01/12/19 2223  WBC 9.5  --   NEUTROABS 5.4  --   HGB 13.8 15.0  HCT 44.1 44.0  MCV 92.5  --   PLT 254  --    Basic Metabolic Panel: Recent Labs  Lab  01/12/19 2150 01/12/19 2223  NA 138 139  K 4.5 4.1  CL 102 101  CO2 26  --   GLUCOSE 111* 111*  BUN 19 22  CREATININE 1.01* 1.00  CALCIUM 9.2  --    GFR: Estimated Creatinine Clearance: 36.4 mL/min (by C-G formula based on SCr of 1 mg/dL). Liver Function Tests: Recent Labs  Lab 01/12/19 2150  AST 27  ALT 10  ALKPHOS 55  BILITOT 1.0  PROT 6.8  ALBUMIN 3.7   No results for input(s): LIPASE, AMYLASE in the last 168 hours. No results for input(s): AMMONIA in the last 168 hours. Coagulation Profile: Recent Labs  Lab 01/12/19 2150 01/13/19 0818  INR 1.1 1.1   Cardiac Enzymes: No results for input(s): CKTOTAL, CKMB, CKMBINDEX, TROPONINI in the last 168 hours. BNP (last 3 results) No results for input(s): PROBNP in the last 8760 hours. HbA1C: Recent Labs    01/13/19 0531  HGBA1C 5.9*   CBG: No results for input(s): GLUCAP in the last 168 hours. Lipid Profile: Recent Labs    01/13/19 0531  CHOL 211*  HDL 51  LDLCALC 130*  TRIG 152*  CHOLHDL 4.1   Thyroid Function Tests: No results for input(s): TSH, T4TOTAL, FREET4, T3FREE, THYROIDAB in the last 72 hours. Anemia Panel: No results for input(s): VITAMINB12, FOLATE, FERRITIN, TIBC, IRON, RETICCTPCT in the last 72 hours. Sepsis Labs: No results for input(s): PROCALCITON, LATICACIDVEN in the last 168 hours.  Recent Results (from the past 240 hour(s))  SARS CORONAVIRUS 2 (TAT 6-24 HRS) Nasopharyngeal Nasopharyngeal Swab     Status: None   Collection Time: 01/13/19  3:15 AM   Specimen: Nasopharyngeal Swab  Result Value Ref Range Status   SARS Coronavirus 2 NEGATIVE NEGATIVE Final    Comment: (NOTE) SARS-CoV-2 target nucleic acids are NOT DETECTED. The SARS-CoV-2 RNA is generally detectable in upper and lower respiratory specimens during the acute phase of infection. Negative results do not preclude SARS-CoV-2 infection, do not rule out co-infections with other pathogens, and should not be used as the sole  basis for treatment or other patient management decisions. Negative results must be combined with clinical observations, patient history, and epidemiological information. The expected result is Negative. Fact Sheet for Patients: SugarRoll.be Fact Sheet for Healthcare Providers: https://www.woods-mathews.com/ This test is not yet approved or cleared by the [place] FDA and  has been authorized for detection and/or diagnosis of SARS-CoV-2 by FDA under an Emergency Use Authorization (EUA). This EUA will remain  in effect (meaning this test can be used) for the duration of the COVID-19 declaration under Section 56 4(b)(1) of the Act, 21 U.S.C. section 360bbb-3(b)(1), unless the authorization is terminated or revoked sooner. Performed at [facility], [place] [address] [place], [place] [address]      Radiology Studies: Ct Angio Head W Or Wo Contrast  Result Date: 01/13/2019 CLINICAL DATA:  Stroke, follow-up EXAM: CT ANGIOGRAPHY HEAD AND NECK TECHNIQUE: Multidetector CT imaging of the head and neck was performed using the standard protocol during bolus administration of intravenous contrast. Multiplanar CT image reconstructions and MIPs were obtained to evaluate the vascular anatomy. Carotid stenosis measurements (when applicable) are obtained utilizing NASCET criteria, using the distal internal carotid diameter as the denominator. CONTRAST:  3mL OMNIPAQUE IOHEXOL 350 MG/ML SOLN COMPARISON:  MRA head 2015 FINDINGS: CTA NECK FINDINGS Aortic arch: Mild calcified plaque along the aortic arch. Great vessel origins are patent. Right carotid system: Common, internal, and external carotid arteries are patent. There is calcified plaque at the bifurcation. Minimal stenosis of the proximal right ICA. Left carotid system: Common, internal, and external carotid arteries are patent. Mild calcified plaque along common carotid. Calcified plaque at the bifurcation  and along the proximal internal carotid causes less than 50% stenosis. Vertebral arteries: Extracranial vertebral arteries are patent. Left vertebral artery is dominant. No significant stenosis or evidence of dissection. Skeleton: Multilevel degenerative changes. Other neck: No neck mass or adenopathy. Upper chest: No apical lung mass. Review of the MIP images confirms the above findings CTA HEAD FINDINGS Anterior circulation: Intracranial internal carotid arteries are patent. Calcified plaque along cavernous and paraclinoid portions causes mild stenosis. Anterior and middle cerebral arteries are patent. Posterior circulation: Intracranial vertebral arteries are patent with minimal calcified plaque. Basilar artery is patent. Posterior cerebral arteries are patent. Venous sinuses: Patent as permitted by contrast timing. Anatomic variants: Fetal origin of the left PCA. Review of the MIP images confirms the above findings IMPRESSION: No large vessel occlusion. No hemodynamically significant stenosis in the neck. Electronically Signed   By: Macy Mis M.D.   On: 01/13/2019 10:06   Ct Head Wo Contrast  Result Date: 01/12/2019 CLINICAL DATA:  Slurred speech. EXAM: CT HEAD WITHOUT CONTRAST TECHNIQUE: Contiguous axial images were obtained from the base of the skull through the vertex without intravenous contrast. COMPARISON:  02/03/2014 FINDINGS: Brain: old right posterior parietal infarct, stable. Old left basal ganglia lacunar infarct. There is atrophy and chronic small vessel disease changes. No acute intracranial abnormality. Specifically, no hemorrhage, hydrocephalus, mass lesion, acute infarction, or significant intracranial injury. Vascular: No evidence of aneurysm or adenopathy. Skull: No acute calvarial abnormality. Sinuses/Orbits: No acute finding Other: None IMPRESSION: Old infarcts in the right posterior parietal lobe and left basal ganglia. Atrophy, chronic microvascular disease. No acute intracranial  abnormality. Electronically Signed   By: Rolm Baptise M.D.   On: 01/12/2019 23:06   Ct Angio Neck W Or Wo Contrast  Result Date: 01/13/2019 CLINICAL DATA:  Stroke, follow-up EXAM: CT ANGIOGRAPHY HEAD AND NECK TECHNIQUE: Multidetector CT imaging of the head and neck was performed using the standard protocol during bolus administration of intravenous contrast. Multiplanar CT image reconstructions and MIPs were obtained to evaluate the vascular anatomy. Carotid stenosis measurements (when applicable) are obtained utilizing NASCET criteria, using the distal internal carotid diameter as the denominator. CONTRAST:  61mL OMNIPAQUE IOHEXOL 350 MG/ML SOLN COMPARISON:  MRA head 2015 FINDINGS: CTA NECK FINDINGS Aortic arch: Mild calcified plaque along the aortic arch. Great vessel origins are patent. Right carotid system: Common, internal, and external carotid arteries are patent. There is calcified plaque at the bifurcation. Minimal stenosis of the proximal right ICA. Left carotid system: Common, internal, and external carotid arteries are patent. Mild calcified plaque along common carotid. Calcified plaque at the bifurcation and along the proximal internal carotid causes less than 50%  stenosis. Vertebral arteries: Extracranial vertebral arteries are patent. Left vertebral artery is dominant. No significant stenosis or evidence of dissection. Skeleton: Multilevel degenerative changes. Other neck: No neck mass or adenopathy. Upper chest: No apical lung mass. Review of the MIP images confirms the above findings CTA HEAD FINDINGS Anterior circulation: Intracranial internal carotid arteries are patent. Calcified plaque along cavernous and paraclinoid portions causes mild stenosis. Anterior and middle cerebral arteries are patent. Posterior circulation: Intracranial vertebral arteries are patent with minimal calcified plaque. Basilar artery is patent. Posterior cerebral arteries are patent. Venous sinuses: Patent as permitted  by contrast timing. Anatomic variants: Fetal origin of the left PCA. Review of the MIP images confirms the above findings IMPRESSION: No large vessel occlusion. No hemodynamically significant stenosis in the neck. Electronically Signed   By: Macy Mis M.D.   On: 01/13/2019 10:06   Mr Brain Wo Contrast  Result Date: 01/13/2019 CLINICAL DATA:  85 year old female with slurred speech, stroke-like symptoms. History of prior stroke with persistent right side deficits. EXAM: MRI HEAD WITHOUT CONTRAST TECHNIQUE: Multiplanar, multiecho pulse sequences of the brain and surrounding structures were obtained without intravenous contrast. COMPARISON:  Brain MRI 02/03/2014.  Head CT 01/12/2019. FINDINGS: Brain: There are several small cortical areas of restricted diffusion scattered from the left operculum (series 5, image 76) to the posterior left temporal and parietal lobes. Trace if any associated T2 and FLAIR hyperintensity with no hemorrhage or mass effect. No other restricted diffusion. There are chronic lacunar infarcts of the bilateral corona radiata, bilateral basal ganglia, and small chronic cortical infarcts in the bilateral posterior MCA territories (more pronounced on the right series 19 images 14 and 15). There is some hemosiderin associated with the chronic right parietal lobe encephalomalacia. No definite other chronic cerebral blood products. The thalami, brainstem and cerebellum remain within normal limits. No midline shift, mass effect, evidence of mass lesion, ventriculomegaly, extra-axial collection or acute intracranial hemorrhage. Cervicomedullary junction and pituitary are within normal limits. Vascular: Major intracranial vascular flow voids are stable since 2015. Skull and upper cervical spine: Degenerative ligamentous hypertrophy about the odontoid. Otherwise negative visible cervical spine. Visualized bone marrow signal is within normal limits. Sinuses/Orbits: Stable and negative. Other:  Mastoids remain clear. Visible internal auditory structures appear normal. Scalp and face soft tissues appear negative. IMPRESSION: 1. Positive for several small scattered cortical infarcts in the left MCA from the operculum posteriorly. No associated hemorrhage or mass effect. 2. Underlying chronic bilateral MCA territory small and medium-sized vessel ischemia. Electronically Signed   By: Genevie Ann M.D.   On: 01/13/2019 02:34    Scheduled Meds:  amLODipine  5 mg Oral Daily   atorvastatin  40 mg Oral q1800   benazepril  20 mg Oral Daily   cephALEXin  500 mg Oral Q12H   dorzolamide-timolol  1 drop Both Eyes BID   hydrALAZINE  25 mg Oral Q8H   latanoprost  1 drop Both Eyes QHS   levothyroxine  100 mcg Oral QAC breakfast   omega-3 acid ethyl esters  1 g Oral Daily   pilocarpine  1 drop Both Eyes TID AC & HS   Continuous Infusions:  sodium chloride 75 mL/hr at 01/13/19 1046   heparin 750 Units/hr (01/13/19 1058)     LOS: 0 days   Marylu Lund, MD [facility] Pager On Amion  If 7PM-7AM, please contact night-coverage 01/13/2019, 4:57 PM

## 2019-01-13 NOTE — ED Notes (Signed)
Tele   Breakfast ordered  

## 2019-01-13 NOTE — Consult Note (Addendum)
Neurology Consultation  Reason for Consult: Stroke Referring Physician: Dr. Hal Hope  CC: Dysarthria, left-sided numbness  History is obtained from: Patient, chart review  HPI: Amanda Cochran is a 85 y.o. female past medical history of atrial fibrillation on anticoagulation with Eliquis, prior history of a left basal ganglia stroke with minimal right-sided hemiparesis, glaucoma and macular degeneration with severe visual loss, arthritis and chronic back pain came into the hospital for evaluation of strokelike symptoms that consisted of slurred speech. Unclear last known normal.  She says she has been feeling unwell for the last 1 to 2 days but had an episode of slurred speech when she was talking to a family member.  She reports her speech has since become baseline but she also had some left arm and leg numbness which she continues to have. She reports her prior stroke had affected the right side and she had made very good recovery with residual deficits being very minimal. She lives by herself, uses a cane to walk outside the house but walks without a cane inside the house. Denies any apparent visual symptoms because her vision is extremely bad and she can barely see shapes out of both her eyes due to her glaucoma.  Noted some left head pain/discomfort.  No other tingling numbness or weakness other than the left side. Denies any chest pain shortness of breath nausea vomiting.  Denies any recent sick contacts or illnesses. Does not drive due to the decreased visual acuity secondary to the glaucoma.  Continues to do daily strengthening exercises as had been advised after the last stroke in 2015.  Of note, patient is allergic to Plavix and aspirin-GI bleed and GI intolerance side effects.  After her last stroke, she was started on Aggrenox for a while and then eventually atrial fibrillation was discovered and she was switched to Eliquis only.  LKW: Greater than 24 hours ago tpa given?: no, outside  the window Premorbid modified Rankin scale (mRS): 2  ROS: ROS was performed and is negative except as noted in the HPI.    Past Medical History:  Diagnosis Date  . Arthritis   . Bladder infection   . CVA (cerebral infarction) 02/07/2014   Left basal ganglia infarct with right hemiparesis   . Glaucoma   . Glaucoma   . Glaucoma (increased eye pressure)   . Hyperlipidemia   . Hypertension   . Left sided lacunar stroke ([place]) 02/14/2014  . Right hemiparesis ([place]) 02/09/2014   Left basal ganglia infarct Dec 2015   . Stroke ([place])   . Thyroid disease    Family History  Problem Relation Age of Onset  . Heart failure Mother   . AAA (abdominal aortic aneurysm) Father    Social History:   reports that she has never smoked. She has never used smokeless tobacco. She reports that she does not drink alcohol or use drugs.  Medications  Current Facility-Administered Medications:  .   stroke: mapping our early stages of recovery book, , Does not apply, Once, Rise Patience, MD .  0.9 %  sodium chloride infusion, , Intravenous, Continuous, Rise Patience, MD .  acetaminophen (TYLENOL) tablet 650 mg, 650 mg, Oral, Q4H PRN **OR** acetaminophen (TYLENOL) 160 MG/5ML solution 650 mg, 650 mg, Per Tube, Q4H PRN **OR** acetaminophen (TYLENOL) suppository 650 mg, 650 mg, Rectal, Q4H PRN, Rise Patience, MD .  amLODipine (NORVASC) tablet 5 mg, 5 mg, Oral, Daily, Rise Patience, MD .  benazepril (LOTENSIN) tablet 20 mg,  20 mg, Oral, Daily, Gean Birchwood N, MD .  cephALEXin [facility]) capsule 500 mg, 500 mg, Oral, Q12H, Rise Patience, MD, 500 mg at 01/13/19 0314 .  dorzolamide-timolol (COSOPT) 22.3-6.8 MG/ML ophthalmic solution 1 drop, 1 drop, Both Eyes, BID, Rise Patience, MD .  hydrALAZINE (APRESOLINE) tablet 25 mg, 25 mg, Oral, Q8H, Kakrakandy, Arshad N, MD .  latanoprost (XALATAN) 0.005 % ophthalmic solution 1 drop, 1 drop, Both Eyes, QHS, Rise Patience,  MD .  levothyroxine (SYNTHROID) tablet 100 mcg, 100 mcg, Oral, QAC breakfast, Rise Patience, MD .  lidocaine (PF) (XYLOCAINE) 1 % injection 2 mL, 2 mL, Other, Once, Magnus Sinning, MD .  omega-3 acid ethyl esters (LOVAZA) capsule 1 g, 1 g, Oral, Daily, Rise Patience, MD .  pilocarpine (PILOCAR) 1 % ophthalmic solution 1 drop, 1 drop, Both Eyes, TID AC & HS, Rise Patience, MD .  simvastatin (ZOCOR) tablet 40 mg, 40 mg, Oral, QPM, Rise Patience, MD  Current Outpatient Medications:  .  apixaban (ELIQUIS) 5 MG TABS tablet, Take 5 mg by mouth 2 (two) times daily., Disp: , Rfl:  .  levothyroxine (SYNTHROID, LEVOTHROID) 100 MCG tablet, Take 1 tablet (100 mcg total) by mouth daily. (Patient taking differently: Take 100 mcg by mouth daily before breakfast. ), Disp: 30 tablet, Rfl: 1 .  amLODipine (NORVASC) 5 MG tablet, Take 1 tablet by mouth daily., Disp: , Rfl:  .  benazepril (LOTENSIN) 20 MG tablet, Take 20 mg by mouth daily., Disp: , Rfl:  .  Cholecalciferol (VITAMIN D) 2000 UNITS CAPS, Take 1 capsule by mouth daily., Disp: , Rfl:  .  dorzolamide-timolol (COSOPT) 22.3-6.8 MG/ML ophthalmic solution, Place 1 drop into both eyes 2 (two) times daily., Disp: , Rfl:  .  hydrALAZINE (APRESOLINE) 25 MG tablet, Take 1 tablet (25 mg total) by mouth every 8 (eight) hours., Disp: 90 tablet, Rfl: 1 .  latanoprost (XALATAN) 0.005 % ophthalmic solution, Place 1 drop into both eyes at bedtime. , Disp: , Rfl:  .  Multiple Vitamins-Minerals (CENTRUM SILVER PO), Take 1 tablet by mouth daily., Disp: , Rfl:  .  Omega-3 Fatty Acids (FISH OIL) 1000 MG CAPS, Take 1,000 mg by mouth daily. , Disp: , Rfl:  .  pilocarpine (PILOCAR) 1 % ophthalmic solution, Place 1 drop into both eyes 4 (four) times daily. , Disp: , Rfl: 10 .  simvastatin (ZOCOR) 40 MG tablet, Take 40 mg by mouth every evening., Disp: , Rfl:   Exam: Current vital signs: BP (!) 166/82   Pulse 79   Temp (!) 97.5 F (36.4 C) (Oral)    Resp (!) 34   Ht 5\' 6"  (1.676 m)   Wt 68 kg   SpO2 95%   BMI 24.21 kg/m  Vital signs in last 24 hours: Temp:  [97.5 F (36.4 C)] 97.5 F (36.4 C) (11/19 2147) Pulse Rate:  [50-143] 79 (11/20 0515) Resp:  [11-36] 34 (11/20 0515) BP: (133-189)/(65-126) 166/82 (11/20 0515) SpO2:  [94 %-98 %] 95 % (11/20 0515) Weight:  [68 kg] 68 kg (11/19 2152) General: Awake alert in no distress HEENT: Normocephalic atraumatic Lungs: Clear to auscultation Cardiovascular: Irregularly irregular Abdomen: Soft nondistended Extremities: Warm well perfused Neurological exam Awake alert oriented x3 Speech is mildly dysarthric Naming comprehension and fluency intact. Cranial nerves: Pupils are equal round reactive to light, visual extremity extremely poor in both eyes limited to finger counting but is able to follow shapes, extraocular movements intact, visual fields appear  full and not constricted to confrontation, facial sensation intact, face appears symmetric, tongue and palate midline. Motor exam: She is antigravity symmetric 5/5 in all 4 extremities with normal tone normal range of motion. Sensory exam: Left-sided decreased sensation to light touch from head to toe. Coordination: Intact finger-nose-finger testing Gait testing was deferred NIH stroke scale - 1 for dysarthria  Labs I have reviewed labs in epic and the results pertinent to this consultation are: CBC    Component Value Date/Time   WBC 9.5 01/12/2019 2150   RBC 4.77 01/12/2019 2150   HGB 15.0 01/12/2019 2223   HCT 44.0 01/12/2019 2223   PLT 254 01/12/2019 2150   MCV 92.5 01/12/2019 2150   MCH 28.9 01/12/2019 2150   MCHC 31.3 01/12/2019 2150   RDW 14.4 01/12/2019 2150   LYMPHSABS 3.1 01/12/2019 2150   MONOABS 0.7 01/12/2019 2150   EOSABS 0.2 01/12/2019 2150   BASOSABS 0.1 01/12/2019 2150   CMP     Component Value Date/Time   NA 139 01/12/2019 2223   K 4.1 01/12/2019 2223   CL 101 01/12/2019 2223   CO2 26 01/12/2019  2150   GLUCOSE 111 (H) 01/12/2019 2223   BUN 22 01/12/2019 2223   CREATININE 1.00 01/12/2019 2223   CALCIUM 9.2 01/12/2019 2150   PROT 6.8 01/12/2019 2150   ALBUMIN 3.7 01/12/2019 2150   AST 27 01/12/2019 2150   ALT 10 01/12/2019 2150   ALKPHOS 55 01/12/2019 2150   BILITOT 1.0 01/12/2019 2150   GFRNONAA 50 (L) 01/12/2019 2150   GFRAA 58 (L) 01/12/2019 2150   Imaging I have reviewed the images obtained: CT-scan of the brain-no acute changes.  Old right posterior parietal and left basal ganglia infarcts. MRI examination of the brain shows several small scattered cortical infarcts in the left MCA territory.  No hemorrhage.  Assessment:  85 year old with past medical history of atrial fibrillation on Eliquis, prior left basal ganglia stroke with minimal right-sided hemiparesis, glaucoma with severe visual loss, arthritis chronic back pain for strokelike symptoms of slurred speech and left-sided numbness. On examination has mild dysarthria and a subjective decrease sensation on the left side of the body. MRI shows scattered left MCA cortical infarct-likely cardioembolic.  Impression: Acute ischemic stroke likely cardioembolic  Recommendations: -Telemetry -Frequent neurochecks -2D echocardiogram -A1c -CTA head neck -Lipid panel -Atorvastatin 80 now and daily -Hold off on antiplatelets or oral anticoagulants as she is allergic to aspirin and Plavix. I would start her on heparin drip (stroke protocol, no bolus, lower aPTT range) -Switch from Eliquis to another DOAC in the next day or two - as these strokes can potentially be considered Eliquis failure. -Can start normalizing blood pressures as her symptoms might be over 59 days old. -Physical therapy -Occupational Therapy -Speech therapy -Stroke swallow screen-n.p.o. until passes swallow screen Stroke team will follow with you Plan relayed to Dr. Hal Hope.  -- Amie Portland, MD [facility] Pager: [phone] If  7pm to 7am, please call on call as listed on AMION.

## 2019-01-13 NOTE — H&P (Signed)
History and Physical    Amanda Cochran [id number] DOB: Apr 16, 1928 DOA: 01/12/2019  PCP: Crist Infante, MD  Patient coming from: Home.  Chief Complaint: Slurred speech.  HPI: Amanda Cochran is a 85 y.o. female with history of A. fib on Eliquis, hypertension, hyperlipidemia previous stroke, hypothyroidism last evening around 7 PM was noticed to have some slurred speech while she was talking to her friend over the phone.  EMS was called and patient was found to have slurred speech and was brought to the ER.  Patient denies any weakness of upper or lower extremity or any difficulty swallowing.  Patient has poor vision from glaucoma.  By the time patient is to ER symptoms have improved.  Prior to this patient states she has not been feeling well for last 2 days.  ED Course: In the ER patient symptoms are improved and on all extremities patient has good strength.  Patient passed swallow.  MRI of the brain done shows scattered left MCA territory acute stroke.  Neurology on-call was consulted.  Patient admitted for further management of acute CVA.  UA shows possibility of UTI.  EKG shows A. fib rate around 101 bpm.  Creatinine 1 hemoglobin 13.8 platelets 254 COVID-19 test is pending.  Review of Systems: As per HPI, rest all negative.   Past Medical History:  Diagnosis Date   Arthritis    Bladder infection    CVA (cerebral infarction) 02/07/2014   Left basal ganglia infarct with right hemiparesis    Glaucoma    Glaucoma    Glaucoma (increased eye pressure)    Hyperlipidemia    Hypertension    Left sided lacunar stroke ([place]) 02/14/2014   Right hemiparesis ([place]) 02/09/2014   Left basal ganglia infarct Dec 2015    Stroke [facility])    Thyroid disease     Past Surgical History:  Procedure Laterality Date   cataract surgery Bilateral 2015   THYROID SURGERY  1976   TONSILLECTOMY AND ADENOIDECTOMY     VAGINAL PROLAPSE REPAIR       reports that she has never smoked. She has never  used smokeless tobacco. She reports that she does not drink alcohol or use drugs.  No Known Allergies  Family History  Problem Relation Age of Onset   Heart failure Mother    AAA (abdominal aortic aneurysm) Father     Prior to Admission medications   Medication Sig Start Date End Date Taking? Authorizing Provider  apixaban (ELIQUIS) 5 MG TABS tablet Take 5 mg by mouth 2 (two) times daily.   Yes [provider]  levothyroxine (SYNTHROID, LEVOTHROID) 100 MCG tablet Take 1 tablet (100 mcg total) by mouth daily. Patient taking differently: Take 100 mcg by mouth daily before breakfast.  02/19/14  Yes Angiulli, Lavon Paganini, PA-C  amLODipine (NORVASC) 5 MG tablet Take 1 tablet by mouth daily. 03/03/18   [provider]  benazepril (LOTENSIN) 20 MG tablet Take 20 mg by mouth daily.    [provider]  Cholecalciferol (VITAMIN D) 2000 UNITS CAPS Take 1 capsule by mouth daily.    [provider]  dorzolamide-timolol (COSOPT) 22.3-6.8 MG/ML ophthalmic solution Place 1 drop into both eyes 2 (two) times daily.    [provider]  hydrALAZINE (APRESOLINE) 25 MG tablet Take 1 tablet (25 mg total) by mouth every 8 (eight) hours. 02/19/14   Angiulli, Lavon Paganini, PA-C  latanoprost (XALATAN) 0.005 % ophthalmic solution Place 1 drop into both eyes at bedtime.  [provider]  Multiple Vitamins-Minerals (CENTRUM SILVER PO) Take 1 tablet by mouth daily.    [provider]  Omega-3 Fatty Acids (FISH OIL) 1000 MG CAPS Take 1,000 mg by mouth daily.     [provider]  pilocarpine (PILOCAR) 1 % ophthalmic solution Place 1 drop into both eyes 4 (four) times daily.  08/06/16   [provider]  simvastatin (ZOCOR) 40 MG tablet Take 40 mg by mouth every evening.    [provider]    Physical Exam: Constitutional: Moderately built and nourished. Vitals:   01/13/19 0415 01/13/19 0430 01/13/19 0445 01/13/19 0500  BP: (!) 162/70  (!) 166/75  (!) 152/75  Pulse: (!) 50 (!) 51 84 76  Resp: (!) 36 (!) 26 17 (!) 21  Temp:      TempSrc:      SpO2: 96% 94% 94% 94%  Weight:      Height:       Eyes: Anicteric no pallor. ENMT: No discharge from the ears eyes nose or mouth. Neck: No mass felt.  No neck rigidity. Respiratory: No rhonchi or crepitations. Cardiovascular: S1-S2 heard. Abdomen: Soft nontender bowel sounds present. Musculoskeletal: No edema. Skin: No rash. Neurologic: Alert awake oriented to time place and person.  Has poor vision.  No facial asymmetry tongue is midline moves all extremities 5 x 5. Psychiatric: Appears normal.   Labs on Admission: I have personally reviewed following labs and imaging studies  CBC: Recent Labs  Lab 01/12/19 2150 01/12/19 2223  WBC 9.5  --   NEUTROABS 5.4  --   HGB 13.8 15.0  HCT 44.1 44.0  MCV 92.5  --   PLT 254  --    Basic Metabolic Panel: Recent Labs  Lab 01/12/19 2150 01/12/19 2223  NA 138 139  K 4.5 4.1  CL 102 101  CO2 26  --   GLUCOSE 111* 111*  BUN 19 22  CREATININE 1.01* 1.00  CALCIUM 9.2  --    GFR: Estimated Creatinine Clearance: 36.4 mL/min (by C-G formula based on SCr of 1 mg/dL). Liver Function Tests: Recent Labs  Lab 01/12/19 2150  AST 27  ALT 10  ALKPHOS 55  BILITOT 1.0  PROT 6.8  ALBUMIN 3.7   No results for input(s): LIPASE, AMYLASE in the last 168 hours. No results for input(s): AMMONIA in the last 168 hours. Coagulation Profile: Recent Labs  Lab 01/12/19 2150  INR 1.1   Cardiac Enzymes: No results for input(s): CKTOTAL, CKMB, CKMBINDEX, TROPONINI in the last 168 hours. BNP (last 3 results) No results for input(s): PROBNP in the last 8760 hours. HbA1C: No results for input(s): HGBA1C in the last 72 hours. CBG: No results for input(s): GLUCAP in the last 168 hours. Lipid Profile: No results for input(s): CHOL, HDL, LDLCALC, TRIG, CHOLHDL, LDLDIRECT in the last 72 hours. Thyroid Function Tests: No results for  input(s): TSH, T4TOTAL, FREET4, T3FREE, THYROIDAB in the last 72 hours. Anemia Panel: No results for input(s): VITAMINB12, FOLATE, FERRITIN, TIBC, IRON, RETICCTPCT in the last 72 hours. Urine analysis:    Component Value Date/Time   COLORURINE YELLOW 01/12/2019 2303   APPEARANCEUR CLEAR 01/12/2019 2303   LABSPEC 1.009 01/12/2019 2303   PHURINE 8.0 01/12/2019 [place] 01/12/2019 2303   HGBUR NEGATIVE 01/12/2019 2303   BILIRUBINUR NEGATIVE 01/12/2019 [place] 01/12/2019 2303   PROTEINUR NEGATIVE 01/12/2019 2303   UROBILINOGEN 0.2 02/06/2014 2331   NITRITE NEGATIVE 01/12/2019 2303  LEUKOCYTESUR LARGE (A) 01/12/2019 2303   Sepsis Labs: @LABRCNTIP (procalcitonin:4,lacticidven:4) )No results found for this or any previous visit (from the past 240 hour(s)).   Radiological Exams on Admission: Ct Head Wo Contrast  Result Date: 01/12/2019 CLINICAL DATA:  Slurred speech. EXAM: CT HEAD WITHOUT CONTRAST TECHNIQUE: Contiguous axial images were obtained from the base of the skull through the vertex without intravenous contrast. COMPARISON:  02/03/2014 FINDINGS: Brain: old right posterior parietal infarct, stable. Old left basal ganglia lacunar infarct. There is atrophy and chronic small vessel disease changes. No acute intracranial abnormality. Specifically, no hemorrhage, hydrocephalus, mass lesion, acute infarction, or significant intracranial injury. Vascular: No evidence of aneurysm or adenopathy. Skull: No acute calvarial abnormality. Sinuses/Orbits: No acute finding Other: None IMPRESSION: Old infarcts in the right posterior parietal lobe and left basal ganglia. Atrophy, chronic microvascular disease. No acute intracranial abnormality. Electronically Signed   By: Rolm Baptise M.D.   On: 01/12/2019 23:06   Mr Brain Wo Contrast  Result Date: 01/13/2019 CLINICAL DATA:  85 year old female with slurred speech, stroke-like symptoms. History of prior stroke with  persistent right side deficits. EXAM: MRI HEAD WITHOUT CONTRAST TECHNIQUE: Multiplanar, multiecho pulse sequences of the brain and surrounding structures were obtained without intravenous contrast. COMPARISON:  Brain MRI 02/03/2014.  Head CT 01/12/2019. FINDINGS: Brain: There are several small cortical areas of restricted diffusion scattered from the left operculum (series 5, image 76) to the posterior left temporal and parietal lobes. Trace if any associated T2 and FLAIR hyperintensity with no hemorrhage or mass effect. No other restricted diffusion. There are chronic lacunar infarcts of the bilateral corona radiata, bilateral basal ganglia, and small chronic cortical infarcts in the bilateral posterior MCA territories (more pronounced on the right series 19 images 14 and 15). There is some hemosiderin associated with the chronic right parietal lobe encephalomalacia. No definite other chronic cerebral blood products. The thalami, brainstem and cerebellum remain within normal limits. No midline shift, mass effect, evidence of mass lesion, ventriculomegaly, extra-axial collection or acute intracranial hemorrhage. Cervicomedullary junction and pituitary are within normal limits. Vascular: Major intracranial vascular flow voids are stable since 2015. Skull and upper cervical spine: Degenerative ligamentous hypertrophy about the odontoid. Otherwise negative visible cervical spine. Visualized bone marrow signal is within normal limits. Sinuses/Orbits: Stable and negative. Other: Mastoids remain clear. Visible internal auditory structures appear normal. Scalp and face soft tissues appear negative. IMPRESSION: 1. Positive for several small scattered cortical infarcts in the left MCA from the operculum posteriorly. No associated hemorrhage or mass effect. 2. Underlying chronic bilateral MCA territory small and medium-sized vessel ischemia. Electronically Signed   By: Genevie Ann M.D.   On: 01/13/2019 02:34    EKG:  Independently reviewed.  A. fib rate around 101 bpm.  Assessment/Plan Principal Problem:   Acute CVA (cerebrovascular accident) [facility]) Active Problems:   Essential hypertension   Atrial fibrillation ([place])    1. Acute CVA -patient states she has been compliant with her Eliquis.  Discussed with Dr. Demetra Shiner on-call neurologist who at this time advised to start patient on heparin.  Patient passed swallow.  Neurochecks.  CT angiogram of the head and neck has been ordered by the neurologist.  2D echo.  Check hemoglobin A1c lipid panel physical therapy consult. 2. A. fib presently on heparin.  Closely monitor for any increased rate. 3. Hypertension -neurologist said okay to continue hypertensive medications.  On ACE inhibitor hydralazine and Norvasc. 4. Possible UTI on Keflex.  Follow cultures. 5. Acute renal failure- .  If further worsening may have to hold benazepril.  Gently hydrating for now. 6. Hypothyroidism on Synthroid.  COVID-19 test is pending.   DVT prophylaxis: Heparin. Code Status: Full code. Family Communication: Discussed with patient. Disposition Plan: To be determined. Consults called: Neurologist. Admission status: Observation.   Rise Patience MD [facility] Pager [phone].  If 7PM-7AM, please contact night-coverage [email or web address] Password [facility]  01/13/2019, 5:17 AM

## 2019-01-13 NOTE — Progress Notes (Signed)
*  PRELIMINARY RESULTS* Echocardiogram 2D Echocardiogram has been performed.  Leavy Cella 01/13/2019, 11:46 AM

## 2019-01-14 LAB — CBC
HCT: 42.2 % (ref 36.0–46.0)
Hemoglobin: 13.8 g/dL (ref 12.0–15.0)
MCH: 29.2 pg (ref 26.0–34.0)
MCHC: 32.7 g/dL (ref 30.0–36.0)
MCV: 89.4 fL (ref 80.0–100.0)
Platelets: 231 10*3/uL (ref 150–400)
RBC: 4.72 MIL/uL (ref 3.87–5.11)
RDW: 14.4 % (ref 11.5–15.5)
WBC: 9.4 10*3/uL (ref 4.0–10.5)
nRBC: 0 % (ref 0.0–0.2)

## 2019-01-14 LAB — COMPREHENSIVE METABOLIC PANEL
ALT: 14 U/L (ref 0–44)
AST: 18 U/L (ref 15–41)
Albumin: 3.3 g/dL — ABNORMAL LOW (ref 3.5–5.0)
Alkaline Phosphatase: 43 U/L (ref 38–126)
Anion gap: 11 (ref 5–15)
BUN: 15 mg/dL (ref 8–23)
CO2: 23 mmol/L (ref 22–32)
Calcium: 8.7 mg/dL — ABNORMAL LOW (ref 8.9–10.3)
Chloride: 107 mmol/L (ref 98–111)
Creatinine, Ser: 0.96 mg/dL (ref 0.44–1.00)
GFR calc Af Amer: >60 mL/min (ref >60)
GFR calc non Af Amer: 53 mL/min — ABNORMAL LOW (ref >60)
Glucose, Bld: 101 mg/dL — ABNORMAL HIGH (ref 70–99)
Potassium: 3.7 mmol/L (ref 3.5–5.1)
Sodium: 141 mmol/L (ref 135–145)
Total Bilirubin: 0.5 mg/dL (ref 0.3–1.2)
Total Protein: 6.3 g/dL — ABNORMAL LOW (ref 6.5–8.1)

## 2019-01-14 MED ORDER — ATORVASTATIN CALCIUM 40 MG PO TABS: 40.0000 mg | ORAL_TABLET | Freq: Every day | ORAL | 0 refills | Status: DC

## 2019-01-14 MED ORDER — CEPHALEXIN 500 MG PO CAPS: 500.0000 mg | ORAL_CAPSULE | Freq: Two times a day (BID) | ORAL | 0 refills | Status: DC

## 2019-01-14 MED ORDER — DABIGATRAN ETEXILATE MESYLATE 150 MG PO CAPS: 150.0000 mg | ORAL_CAPSULE | Freq: Two times a day (BID) | ORAL | 0 refills | Status: DC

## 2019-01-14 MED ORDER — AMOXICILLIN 500 MG PO CAPS: 500.0000 mg | ORAL_CAPSULE | Freq: Three times a day (TID) | ORAL | 0 refills | Status: AC

## 2019-01-14 NOTE — Evaluation (Signed)
Physical Therapy Evaluation Patient Details Name: Amanda Cochran MRN: [id number] DOB: September 17, 1928 Today's Date: 01/14/2019   History of Present Illness  Amanda Cochran is a 85 y.o. female with history of A. fib on Eliquis, hypertension, hyperlipidemia previous stroke, hypothyroidism last evening around 7 PM was noticed to have some slurred speech while she was talking to her friend over the phone. [id number] for several small scattered cortical infarcts in the left MCA from the operculum posteriorly, pt in Afib  Clinical Impression  Pt admitted with above. Seems to be functioning close to functional baseline; demonstrates visual impairments (related to glaucoma), decreased cardiovascular endurance, and mild balance deficits. Ambulating 150 feet with no assistive device at a min guard assist level. HR 87-135 bpm. Encouraged warm up exercises prior to mobilization. Would benefit from home safety evaluation with HHPT.     Follow Up Recommendations Home health PT    Equipment Recommendations  None recommended by PT    Recommendations for Other Services       Precautions / Restrictions Precautions Precautions: Fall;Other (comment) Precaution Comments: watch HR Restrictions Weight Bearing Restrictions: No      Mobility  Bed Mobility Overal bed mobility: Independent                Transfers Overall transfer level: Needs assistance Equipment used: None Transfers: Sit to/from Stand Sit to Stand: Supervision            Ambulation/Gait Ambulation/Gait assistance: Min guard Gait Distance (Feet): 150 Feet Assistive device: None Gait Pattern/deviations: WFL(Within Functional Limits)     General Gait Details: Directional cues provided, steady pace, no overt LOB  Stairs            Wheelchair Mobility    Modified Rankin (Stroke Patients Only) Modified Rankin (Stroke Patients Only) Pre-Morbid Rankin Score: No symptoms Modified Rankin: Moderately severe  disability     Balance Overall balance assessment: Needs assistance Sitting-balance support: No upper extremity supported Sitting balance-Leahy Scale: Good     Standing balance support: No upper extremity supported Standing balance-Leahy Scale: Fair                               Pertinent Vitals/Pain Pain Assessment: No/denies pain    Home Living Family/patient expects to be discharged to:: Private residence Living Arrangements: Alone Available Help at Discharge: Neighbor;Friend(s);Available PRN/intermittently Type of Home: House Home Access: Stairs to enter Entrance Stairs-Rails: Psychiatric nurse of Steps: 3 Home Layout: Two level;Bed/bath upstairs Home Equipment: Shower seat;Bedside commode;Walker - 2 wheels;Cane - single point      Prior Function Level of Independence: Independent         Comments: Pt has had services for the Blind before and she was not impressed     Hand Dominance   Dominant Hand: Right    Extremity/Trunk Assessment   Upper Extremity Assessment Upper Extremity Assessment: Overall WFL for tasks assessed    Lower Extremity Assessment Lower Extremity Assessment: Overall WFL for tasks assessed    Cervical / Trunk Assessment Cervical / Trunk Assessment: Normal  Communication   Communication: HOH  Cognition Arousal/Alertness: Awake/alert Behavior During Therapy: WFL for tasks assessed/performed Overall Cognitive Status: Within Functional Limits for tasks assessed                                        General  Comments      Exercises General Exercises - Lower Extremity Long Arc Quad: Both;10 reps;Seated Hip Flexion/Marching: Both;10 reps;Seated Heel Raises: Both;10 reps;Seated   Assessment/Plan    PT Assessment Patient needs continued PT services  PT Problem List Decreased activity tolerance;Decreased balance;Decreased mobility;Cardiopulmonary status limiting activity       PT  Treatment Interventions Gait training;Stair training;Functional mobility training;Therapeutic activities;Therapeutic exercise;Balance training;Patient/family education    PT Goals (Current goals can be found in the Care Plan section)  Acute Rehab PT Goals Patient Stated Goal: to be able to go home PT Goal Formulation: With patient Time For Goal Achievement: 01/28/19 Potential to Achieve Goals: Good    Frequency Min 4X/week   Barriers to discharge        Co-evaluation               AM-PAC PT "6 Clicks" Mobility  Outcome Measure Help needed turning from your back to your side while in a flat bed without using bedrails?: None Help needed moving from lying on your back to sitting on the side of a flat bed without using bedrails?: None Help needed moving to and from a bed to a chair (including a wheelchair)?: None Help needed standing up from a chair using your arms (e.g., wheelchair or bedside chair)?: None Help needed to walk in hospital room?: A Little Help needed climbing 3-5 steps with a railing? : A Little 6 Click Score: 22    End of Session Equipment Utilized During Treatment: Gait belt Activity Tolerance: Patient tolerated treatment well Patient left: in chair;with call bell/phone within reach;with chair alarm set Nurse Communication: Mobility status PT Visit Diagnosis: Unsteadiness on feet (R26.81)    Time: [id number] PT Time Calculation (min) (ACUTE ONLY): 36 min   Charges:   PT Evaluation $PT Eval Moderate Complexity: 1 Mod PT Treatments $Therapeutic Activity: 8-22 mins        Ellamae Sia, PT, DPT Acute Rehabilitation Services Pager [phone] Office [phone]   Willy Eddy 01/14/2019, 11:20 AM

## 2019-01-14 NOTE — Progress Notes (Signed)
AVS reviewed with patient and patient given a copy to take home. All lines removed, patient dressed, belongings packed, and patient taken via wheelchair by nurse to niece's car for discharge.

## 2019-01-14 NOTE — Discharge Summary (Signed)
Physician Discharge Summary  Amanda Cochran [id number] DOB: 1928-03-20 DOA: 01/12/2019  PCP: Crist Infante, MD  Admit date: 01/12/2019 Discharge date: 01/14/2019  Admitted From: Home Disposition:  Home  Recommendations for Outpatient Follow-up:  1. Follow up with PCP in 1-2 weeks 2. Follow up with Neurology as scheduled 3. Follow up with Cardiology as scheduled  Home Health:PT, OT   Discharge Condition:Stable CODE STATUS:Full Diet recommendation: Heart healthy   Brief/Interim Summary: 85 y.o.femalewithhistory of A. fib on Eliquis, hypertension, hyperlipidemia previous stroke, hypothyroidism last evening around 7 PM was noticed to have some slurred speech while she was talking to her friend over the phone. EMS was called and patient was found to have slurred speech and was brought to the ER. Patient denies any weakness of upper or lower extremity or any difficulty swallowing. Patient has poor vision from glaucoma. By the time patient is to ER symptoms have improved. Prior to this patient states she has not been feeling well for last 2 days.  ED Course:In the ER patient symptoms are improved and on all extremities patient has good strength. Patient passed swallow. MRI of the brain done shows scattered left MCA territory acute stroke. Neurology on-call was consulted. Patient admitted for further management of acute CVA. UA shows possibility of UTI. EKG shows A. fib rate around 101 bpm. Creatinine 1 hemoglobin 13.8 platelets 254 COVID-19 test is pending.   Discharge Diagnoses:  Principal Problem:   Acute CVA (cerebrovascular accident) [facility]) Active Problems:   Essential hypertension   Atrial fibrillation ([place])  1. Acute CVA 1. Reportedly compliant with her Eliquis 2. MRI pos for multiple scattered cortical infarcts in L MCVA from the operculum posteriorly 3. Neurology/Stroke Team was following 4. Continue statin. Initially continued on heparin gtt 5. Pt was  transitioned to Pradaxa per Neurology recs 6. 2d echo with normal LVEF 7. CT angiogram of the head and neck reviewed. No large vessel occlusion on head and neck 2. A. Fib 1. Currently rate controlled 2. Initially on heparin gtt 3. Concern for failing Eliquis, thus neurology recommends transition to Pradaxa 3. Hypertension 1. Currently continued on ACE inhibitor hydralazine and Norvasc. 2. BP thus far stable 4. UTI 1. Initially started on Keflex 2. Urine cx pos for >100,000 enterococcus species 3. Discussed with pharmacy. Will complete course of amoxicillin 500mg  TID x 3 days. Rx called into pt's pharmacy 5. Acute renal failure 1. Had been continued on gentle IVF hydration 2. Cr stable 3. Pt voiding well.  6. Hypothyroidism  1. Continue on Synthroid   Discharge Instructions  Discharge Instructions    Ambulatory referral to Neurology   Complete by: As directed    Follow up with stroke clinic NP (Jessica Vanschaick or Cecille Rubin, if both not available, consider Zachery Dauer, or Ahern) at [facility] in about 4 weeks. Thanks.     Allergies as of 01/14/2019   No Known Allergies     Medication List    STOP taking these medications   apixaban 5 MG Tabs tablet Commonly known as: ELIQUIS     TAKE these medications   amLODipine 5 MG tablet Commonly known as: NORVASC Take 1 tablet by mouth daily.   amoxicillin 500 MG capsule Commonly known as: AMOXIL Take 1 capsule (500 mg total) by mouth 3 (three) times daily for 3 days.   atorvastatin 40 MG tablet Commonly known as: LIPITOR Take 1 tablet (40 mg total) by mouth daily at 6 PM.   benazepril 20 MG tablet Commonly known  as: LOTENSIN Take 20 mg by mouth daily.   CENTRUM SILVER PO Take 1 tablet by mouth daily.   dabigatran 150 MG Caps capsule Commonly known as: PRADAXA Take 1 capsule (150 mg total) by mouth every 12 (twelve) hours.   dorzolamide-timolol 22.3-6.8 MG/ML ophthalmic solution Commonly known as:  COSOPT Place 1 drop into both eyes 2 (two) times daily.   Fish Oil 1000 MG Caps Take 1,000 mg by mouth daily.   hydrALAZINE 25 MG tablet Commonly known as: APRESOLINE Take 1 tablet (25 mg total) by mouth every 8 (eight) hours.   latanoprost 0.005 % ophthalmic solution Commonly known as: XALATAN Place 1 drop into both eyes at bedtime.   levothyroxine 100 MCG tablet Commonly known as: SYNTHROID Take 1 tablet (100 mcg total) by mouth daily. What changed: when to take this   pilocarpine 1 % ophthalmic solution Commonly known as: PILOCAR Place 1 drop into both eyes 4 (four) times daily.   Vitamin D 50 MCG (2000 UT) Caps Take 1 capsule by mouth daily.      Follow-up Information    [facility]. Schedule an appointment as soon as possible for a visit in 4 week(s).   Specialty: Neurology Contact information: [address] [place] [phone]       Crist Infante, MD. Schedule an appointment as soon as possible for a visit in 1 week(s).   Specialty: Internal Medicine Contact information: [place] [address] [phone]        Nahser, Wonda Cheng, MD .   Specialty: Cardiology Contact information: [place] [address] [place] [address] [phone]          No Known Allergies  Consultations:  Neurology  Procedures/Studies: Ct Angio Head W Or Wo Contrast  Result Date: 01/13/2019 CLINICAL DATA:  Stroke, follow-up EXAM: CT ANGIOGRAPHY HEAD AND NECK TECHNIQUE: Multidetector CT imaging of the head and neck was performed using the standard protocol during bolus administration of intravenous contrast. Multiplanar CT image reconstructions and MIPs were obtained to evaluate the vascular anatomy. Carotid stenosis measurements (when applicable) are obtained utilizing NASCET criteria, using the distal internal carotid diameter as the denominator. CONTRAST:  76mL OMNIPAQUE IOHEXOL 350 MG/ML  SOLN COMPARISON:  MRA head 2015 FINDINGS: CTA NECK FINDINGS Aortic arch: Mild calcified plaque along the aortic arch. Great vessel origins are patent. Right carotid system: Common, internal, and external carotid arteries are patent. There is calcified plaque at the bifurcation. Minimal stenosis of the proximal right ICA. Left carotid system: Common, internal, and external carotid arteries are patent. Mild calcified plaque along common carotid. Calcified plaque at the bifurcation and along the proximal internal carotid causes less than 50% stenosis. Vertebral arteries: Extracranial vertebral arteries are patent. Left vertebral artery is dominant. No significant stenosis or evidence of dissection. Skeleton: Multilevel degenerative changes. Other neck: No neck mass or adenopathy. Upper chest: No apical lung mass. Review of the MIP images confirms the above findings CTA HEAD FINDINGS Anterior circulation: Intracranial internal carotid arteries are patent. Calcified plaque along cavernous and paraclinoid portions causes mild stenosis. Anterior and middle cerebral arteries are patent. Posterior circulation: Intracranial vertebral arteries are patent with minimal calcified plaque. Basilar artery is patent. Posterior cerebral arteries are patent. Venous sinuses: Patent as permitted by contrast timing. Anatomic variants: Fetal origin of the left PCA. Review of the MIP images confirms the above findings IMPRESSION: No large vessel occlusion. No hemodynamically significant stenosis in the neck. Electronically Signed  By: Macy Mis M.D.   On: 01/13/2019 10:06   Ct Head Wo Contrast  Result Date: 01/12/2019 CLINICAL DATA:  Slurred speech. EXAM: CT HEAD WITHOUT CONTRAST TECHNIQUE: Contiguous axial images were obtained from the base of the skull through the vertex without intravenous contrast. COMPARISON:  02/03/2014 FINDINGS: Brain: old right posterior parietal infarct, stable. Old left basal ganglia lacunar infarct.  There is atrophy and chronic small vessel disease changes. No acute intracranial abnormality. Specifically, no hemorrhage, hydrocephalus, mass lesion, acute infarction, or significant intracranial injury. Vascular: No evidence of aneurysm or adenopathy. Skull: No acute calvarial abnormality. Sinuses/Orbits: No acute finding Other: None IMPRESSION: Old infarcts in the right posterior parietal lobe and left basal ganglia. Atrophy, chronic microvascular disease. No acute intracranial abnormality. Electronically Signed   By: Rolm Baptise M.D.   On: 01/12/2019 23:06   Ct Angio Neck W Or Wo Contrast  Result Date: 01/13/2019 CLINICAL DATA:  Stroke, follow-up EXAM: CT ANGIOGRAPHY HEAD AND NECK TECHNIQUE: Multidetector CT imaging of the head and neck was performed using the standard protocol during bolus administration of intravenous contrast. Multiplanar CT image reconstructions and MIPs were obtained to evaluate the vascular anatomy. Carotid stenosis measurements (when applicable) are obtained utilizing NASCET criteria, using the distal internal carotid diameter as the denominator. CONTRAST:  12mL OMNIPAQUE IOHEXOL 350 MG/ML SOLN COMPARISON:  MRA head 2015 FINDINGS: CTA NECK FINDINGS Aortic arch: Mild calcified plaque along the aortic arch. Great vessel origins are patent. Right carotid system: Common, internal, and external carotid arteries are patent. There is calcified plaque at the bifurcation. Minimal stenosis of the proximal right ICA. Left carotid system: Common, internal, and external carotid arteries are patent. Mild calcified plaque along common carotid. Calcified plaque at the bifurcation and along the proximal internal carotid causes less than 50% stenosis. Vertebral arteries: Extracranial vertebral arteries are patent. Left vertebral artery is dominant. No significant stenosis or evidence of dissection. Skeleton: Multilevel degenerative changes. Other neck: No neck mass or adenopathy. Upper chest: No  apical lung mass. Review of the MIP images confirms the above findings CTA HEAD FINDINGS Anterior circulation: Intracranial internal carotid arteries are patent. Calcified plaque along cavernous and paraclinoid portions causes mild stenosis. Anterior and middle cerebral arteries are patent. Posterior circulation: Intracranial vertebral arteries are patent with minimal calcified plaque. Basilar artery is patent. Posterior cerebral arteries are patent. Venous sinuses: Patent as permitted by contrast timing. Anatomic variants: Fetal origin of the left PCA. Review of the MIP images confirms the above findings IMPRESSION: No large vessel occlusion. No hemodynamically significant stenosis in the neck. Electronically Signed   By: Macy Mis M.D.   On: 01/13/2019 10:06   Mr Brain Wo Contrast  Result Date: 01/13/2019 CLINICAL DATA:  85 year old female with slurred speech, stroke-like symptoms. History of prior stroke with persistent right side deficits. EXAM: MRI HEAD WITHOUT CONTRAST TECHNIQUE: Multiplanar, multiecho pulse sequences of the brain and surrounding structures were obtained without intravenous contrast. COMPARISON:  Brain MRI 02/03/2014.  Head CT 01/12/2019. FINDINGS: Brain: There are several small cortical areas of restricted diffusion scattered from the left operculum (series 5, image 76) to the posterior left temporal and parietal lobes. Trace if any associated T2 and FLAIR hyperintensity with no hemorrhage or mass effect. No other restricted diffusion. There are chronic lacunar infarcts of the bilateral corona radiata, bilateral basal ganglia, and small chronic cortical infarcts in the bilateral posterior MCA territories (more pronounced on the right series 19 images 14 and 15). There is some hemosiderin associated  with the chronic right parietal lobe encephalomalacia. No definite other chronic cerebral blood products. The thalami, brainstem and cerebellum remain within normal limits. No midline  shift, mass effect, evidence of mass lesion, ventriculomegaly, extra-axial collection or acute intracranial hemorrhage. Cervicomedullary junction and pituitary are within normal limits. Vascular: Major intracranial vascular flow voids are stable since 2015. Skull and upper cervical spine: Degenerative ligamentous hypertrophy about the odontoid. Otherwise negative visible cervical spine. Visualized bone marrow signal is within normal limits. Sinuses/Orbits: Stable and negative. Other: Mastoids remain clear. Visible internal auditory structures appear normal. Scalp and face soft tissues appear negative. IMPRESSION: 1. Positive for several small scattered cortical infarcts in the left MCA from the operculum posteriorly. No associated hemorrhage or mass effect. 2. Underlying chronic bilateral MCA territory small and medium-sized vessel ischemia. Electronically Signed   By: Genevie Ann M.D.   On: 01/13/2019 02:34    Subjective: Eager to go home  Discharge Exam: Vitals:   01/14/19 0717 01/14/19 1109  BP: 129/77 (!) 119/55  Pulse: 87 70  Resp: 15 20  Temp: 98.5 F (36.9 C) 98.6 F (37 C)  SpO2: 97% 97%   Vitals:   01/13/19 2330 01/14/19 0346 01/14/19 0717 01/14/19 1109  BP: (!) 118/59 (!) 118/50 129/77 (!) 119/55  Pulse: 69 61 87 70  Resp: (!) 21 16 15 20   Temp: (!) 97.5 F (36.4 C) 98.2 F (36.8 C) 98.5 F (36.9 C) 98.6 F (37 C)  TempSrc: Oral Oral Oral Axillary  SpO2: 95% 96% 97% 97%  Weight:      Height:        General: Pt is alert, awake, not in acute distress Cardiovascular: RRR, S1/S2 +, no rubs, no gallops Respiratory: CTA bilaterally, no wheezing, no rhonchi Abdominal: Soft, NT, ND, bowel sounds + Extremities: no edema, no cyanosis   The results of significant diagnostics from this hospitalization (including imaging, microbiology, ancillary and laboratory) are listed below for reference.     Microbiology: Recent Results (from the past 240 hour(s))  Urine culture     Status:  Abnormal (Preliminary result)   Collection Time: 01/13/19  3:13 AM   Specimen: Urine, Random  Result Value Ref Range Status   Specimen Description URINE, RANDOM  Final   Special Requests NONE  Final   Culture (A)  Final    >=100,000 COLONIES/mL ENTEROCOCCUS FAECALIS SUSCEPTIBILITIES TO FOLLOW Performed at [facility], [place] [address] [place], [place] [address]    Report Status PENDING  Incomplete  SARS CORONAVIRUS 2 (TAT 6-24 HRS) Nasopharyngeal Nasopharyngeal Swab     Status: None   Collection Time: 01/13/19  3:15 AM   Specimen: Nasopharyngeal Swab  Result Value Ref Range Status   SARS Coronavirus 2 NEGATIVE NEGATIVE Final    Comment: (NOTE) SARS-CoV-2 target nucleic acids are NOT DETECTED. The SARS-CoV-2 RNA is generally detectable in upper and lower respiratory specimens during the acute phase of infection. Negative results do not preclude SARS-CoV-2 infection, do not rule out co-infections with other pathogens, and should not be used as the sole basis for treatment or other patient management decisions. Negative results must be combined with clinical observations, patient history, and epidemiological information. The expected result is Negative. Fact Sheet for Patients: SugarRoll.be Fact Sheet for Healthcare Providers: https://www.woods-mathews.com/ This test is not yet approved or cleared by the [place] FDA and  has been authorized for detection and/or diagnosis of SARS-CoV-2 by FDA under an Emergency Use Authorization (EUA). This EUA will remain  in  effect (meaning this test can be used) for the duration of the COVID-19 declaration under Section 56 4(b)(1) of the Act, 21 U.S.C. section 360bbb-3(b)(1), unless the authorization is terminated or revoked sooner. Performed at [facility], [place] [address] [place], [place] [address]      Labs: BNP (last 3 results) No results for input(s): BNP in the last 8760  hours. Basic Metabolic Panel: Recent Labs  Lab 01/12/19 2150 01/12/19 2223 01/14/19 0329  NA 138 139 141  K 4.5 4.1 3.7  CL 102 101 107  CO2 26  --  23  GLUCOSE 111* 111* 101*  BUN 19 22 15   CREATININE 1.01* 1.00 0.96  CALCIUM 9.2  --  8.7*   Liver Function Tests: Recent Labs  Lab 01/12/19 2150 01/14/19 0329  AST 27 18  ALT 10 14  ALKPHOS 55 43  BILITOT 1.0 0.5  PROT 6.8 6.3*  ALBUMIN 3.7 3.3*   No results for input(s): LIPASE, AMYLASE in the last 168 hours. No results for input(s): AMMONIA in the last 168 hours. CBC: Recent Labs  Lab 01/12/19 2150 01/12/19 2223 01/14/19 0329  WBC 9.5  --  9.4  NEUTROABS 5.4  --   --   HGB 13.8 15.0 13.8  HCT 44.1 44.0 42.2  MCV 92.5  --  89.4  PLT 254  --  231   Cardiac Enzymes: No results for input(s): CKTOTAL, CKMB, CKMBINDEX, TROPONINI in the last 168 hours. BNP: Invalid input(s): POCBNP CBG: No results for input(s): GLUCAP in the last 168 hours. D-Dimer No results for input(s): DDIMER in the last 72 hours. Hgb A1c Recent Labs    01/13/19 0531  HGBA1C 5.9*   Lipid Profile Recent Labs    01/13/19 0531  CHOL 211*  HDL 51  LDLCALC 130*  TRIG 152*  CHOLHDL 4.1   Thyroid function studies No results for input(s): TSH, T4TOTAL, T3FREE, THYROIDAB in the last 72 hours.  Invalid input(s): FREET3 Anemia work up No results for input(s): VITAMINB12, FOLATE, FERRITIN, TIBC, IRON, RETICCTPCT in the last 72 hours. Urinalysis    Component Value Date/Time   COLORURINE YELLOW 01/12/2019 2303   APPEARANCEUR CLEAR 01/12/2019 2303   LABSPEC 1.009 01/12/2019 2303   PHURINE 8.0 01/12/2019 2303   GLUCOSEU NEGATIVE 01/12/2019 2303   HGBUR NEGATIVE 01/12/2019 2303   BILIRUBINUR NEGATIVE 01/12/2019 2303   KETONESUR NEGATIVE 01/12/2019 2303   PROTEINUR NEGATIVE 01/12/2019 2303   UROBILINOGEN 0.2 02/06/2014 2331   NITRITE NEGATIVE 01/12/2019 2303   LEUKOCYTESUR LARGE (A) 01/12/2019 2303   Sepsis Labs Invalid input(s):  PROCALCITONIN,  WBC,  LACTICIDVEN Microbiology Recent Results (from the past 240 hour(s))  Urine culture     Status: Abnormal (Preliminary result)   Collection Time: 01/13/19  3:13 AM   Specimen: Urine, Random  Result Value Ref Range Status   Specimen Description URINE, RANDOM  Final   Special Requests NONE  Final   Culture (A)  Final    >=100,000 COLONIES/mL ENTEROCOCCUS FAECALIS SUSCEPTIBILITIES TO FOLLOW Performed at [facility], [place] [address] [place], [place] [address]    Report Status PENDING  Incomplete  SARS CORONAVIRUS 2 (TAT 6-24 HRS) Nasopharyngeal Nasopharyngeal Swab     Status: None   Collection Time: 01/13/19  3:15 AM   Specimen: Nasopharyngeal Swab  Result Value Ref Range Status   SARS Coronavirus 2 NEGATIVE NEGATIVE Final    Comment: (NOTE) SARS-CoV-2 target nucleic acids are NOT DETECTED. The SARS-CoV-2 RNA is generally detectable in  upper and lower respiratory specimens during the acute phase of infection. Negative results do not preclude SARS-CoV-2 infection, do not rule out co-infections with other pathogens, and should not be used as the sole basis for treatment or other patient management decisions. Negative results must be combined with clinical observations, patient history, and epidemiological information. The expected result is Negative. Fact Sheet for Patients: SugarRoll.be Fact Sheet for Healthcare Providers: https://www.woods-mathews.com/ This test is not yet approved or cleared by the [place] FDA and  has been authorized for detection and/or diagnosis of SARS-CoV-2 by FDA under an Emergency Use Authorization (EUA). This EUA will remain  in effect (meaning this test can be used) for the duration of the COVID-19 declaration under Section 56 4(b)(1) of the Act, 21 U.S.C. section 360bbb-3(b)(1), unless the authorization is terminated or revoked sooner. Performed at [facility], [place] [address] [place], [place] [address]    Time spent: 33min  SIGNED:   Marylu Lund, MD  [facility] 01/14/2019, 3:58 PM  If 7PM-7AM, please contact night-coverage

## 2019-01-14 NOTE — Care Management (Signed)
Spoke w patient, she states that she "does physical therapy everyday at home, for the last 5 years." She declined to have PT visit her at home. She stated that her transportation was on the way. The CM needs identified.

## 2019-01-16 LAB — URINE CULTURE: Culture: >=100000 — AB

## 2019-01-25 DIAGNOSIS — E039 Hypothyroidism, unspecified: Secondary | ICD-10-CM | POA: Diagnosis not present

## 2019-01-25 DIAGNOSIS — F329 Major depressive disorder, single episode, unspecified: Secondary | ICD-10-CM | POA: Diagnosis not present

## 2019-01-25 DIAGNOSIS — I6389 Other cerebral infarction: Secondary | ICD-10-CM | POA: Diagnosis not present

## 2019-01-25 DIAGNOSIS — N390 Urinary tract infection, site not specified: Secondary | ICD-10-CM | POA: Diagnosis not present

## 2019-01-25 DIAGNOSIS — E785 Hyperlipidemia, unspecified: Secondary | ICD-10-CM | POA: Diagnosis not present

## 2019-01-25 DIAGNOSIS — I10 Essential (primary) hypertension: Secondary | ICD-10-CM | POA: Diagnosis not present

## 2019-01-25 DIAGNOSIS — I69959 Hemiplegia and hemiparesis following unspecified cerebrovascular disease affecting unspecified side: Secondary | ICD-10-CM | POA: Diagnosis not present

## 2019-01-25 DIAGNOSIS — I4891 Unspecified atrial fibrillation: Secondary | ICD-10-CM | POA: Diagnosis not present

## 2019-01-25 DIAGNOSIS — R7301 Impaired fasting glucose: Secondary | ICD-10-CM | POA: Diagnosis not present

## 2019-01-25 DIAGNOSIS — R5383 Other fatigue: Secondary | ICD-10-CM | POA: Diagnosis not present

## 2019-01-30 ENCOUNTER — Telehealth: Payer: Self-pay | Admitting: Neurology

## 2019-01-30 NOTE — Telephone Encounter (Signed)
error 

## 2019-02-09 ENCOUNTER — Encounter: Payer: Self-pay | Admitting: Neurology

## 2019-02-09 ENCOUNTER — Other Ambulatory Visit: Payer: Self-pay

## 2019-02-09 ENCOUNTER — Ambulatory Visit (INDEPENDENT_AMBULATORY_CARE_PROVIDER_SITE_OTHER): Payer: Medicare Other | Admitting: Neurology

## 2019-02-09 DIAGNOSIS — I4819 Other persistent atrial fibrillation: Secondary | ICD-10-CM | POA: Diagnosis not present

## 2019-02-09 DIAGNOSIS — I6310 Cerebral infarction due to embolism of unspecified precerebral artery: Secondary | ICD-10-CM

## 2019-02-09 DIAGNOSIS — I639 Cerebral infarction, unspecified: Secondary | ICD-10-CM

## 2019-02-09 NOTE — Progress Notes (Signed)
Virtual Visit via Telephone Note  I connected with Amanda Cochran on 02/09/19 at  1:30 PM EST by telephone and verified that I am speaking with the correct person using two identifiers.  Location: Patient: at home Provider: at home   I discussed the limitations, risks, security and privacy concerns of performing an evaluation and management service by telephone and the availability of in person appointments. I also discussed with the patient that there may be a patient responsible charge related to this service. The patient expressed understanding and agreed to proceed.   History of Present Illness: Amanda Cochran is evaluated today for virtual telephonic visit following recent hospital admission for stroke in November 2020.  She has a past medical history of hypertension, hyperlipidemia left basal ganglia stroke in December 2015, atrial fibrillation diagnosed 2 years ago and hypothyroidism.  On 01/11/2019 she developed sudden onset of slurred speech while talking to a friend over the phone.  EMS was called and they noticed slurred speech and brought her to the emergency room where her symptoms recovered.  She has blindness at baseline secondary to macular degeneration and glaucoma.  CT scan of the head in the emergency room was unremarkable subsequently MRI scan showed small embolic left temporal MCA branch infarcts.  Transthoracic echo showed normal ejection fraction.  LDL cholesterol is elevated at 130 mg percent and hemoglobin A1c was 5.9.  Patient was on Eliquis for stroke prevention after discussion about alternatives and risk benefits she was switched to Pradaxa however after she reached home she realized she could not afford it as her co-pay was $200 a month.  She went back to taking Eliquis and is tolerating it well without bruising or bleeding.  She was also started on Lipitor 40 mg which she is tolerating well without side effects.  She states her blood pressure is under good control.  She has had no  recurrent TIA or stroke symptoms.  She remains alone and has some help from her neighbors.  She does not have anybody to write with to go for her medical follow-up appointments.    Observations/Objective: Physical exam is not possible and neurological exam is limited due to constraints of telephonic visit.  Patient seems pleasant awake alert interactive and cooperative.  Speech and language appear normal.  Memory of recent events is also intact.   Assessment and Plan: 85 year old lady with embolic left MCA branch infarct in November 2020 secondary to atrial fibrillation despite being on anticoagulation with Eliquis.  Vascular risk factors of hypertension, hyperlipidemia, atrial fibrillation and previous left basal ganglia infarct in December 2015.   Follow Up Instructions: I had a long discussion with patient with regards to her recent embolic stroke from atrial fibrillation and discussed available treatment options with anticoagulants and unfortunately she is not able to afford Pradaxa hence we will continue Eliquis for stroke prevention.  She was advised to maintain aggressive risk factor modification with strict control of hypertension with blood pressure goal below 130/90, lipids with LDL cholesterol goal below 70 mg percent and to eat a healthy diet and to be active and not gain weight.  Patient will need a repeat lipid profile but she has no transportation this will have to wait till next visit with primary physician Dr. Joylene Draft.  She will return for follow-up in 6 months or call earlier if necessary.   I discussed the assessment and treatment plan with the patient. The patient was provided an opportunity to ask questions and all were answered.  The patient agreed with the plan and demonstrated an understanding of the instructions.   The patient was advised to call back or seek an in-person evaluation if the symptoms worsen or if the condition fails to improve as anticipated.  I provided 25  minutes of non-face-to-face time during this encounter.   Antony Contras, MD

## 2019-02-27 DIAGNOSIS — H401133 Primary open-angle glaucoma, bilateral, severe stage: Secondary | ICD-10-CM | POA: Diagnosis not present

## 2019-03-06 ENCOUNTER — Telehealth: Payer: Self-pay | Admitting: *Deleted

## 2019-03-06 ENCOUNTER — Other Ambulatory Visit: Payer: Self-pay

## 2019-03-06 ENCOUNTER — Telehealth (INDEPENDENT_AMBULATORY_CARE_PROVIDER_SITE_OTHER): Payer: Medicare Other | Admitting: Cardiovascular Disease

## 2019-03-06 VITALS — Ht 66.0 in

## 2019-03-06 DIAGNOSIS — I4819 Other persistent atrial fibrillation: Secondary | ICD-10-CM

## 2019-03-06 DIAGNOSIS — I63419 Cerebral infarction due to embolism of unspecified middle cerebral artery: Secondary | ICD-10-CM

## 2019-03-06 DIAGNOSIS — I639 Cerebral infarction, unspecified: Secondary | ICD-10-CM | POA: Diagnosis not present

## 2019-03-06 DIAGNOSIS — I10 Essential (primary) hypertension: Secondary | ICD-10-CM

## 2019-03-06 DIAGNOSIS — Z7189 Other specified counseling: Secondary | ICD-10-CM

## 2019-03-06 DIAGNOSIS — E782 Mixed hyperlipidemia: Secondary | ICD-10-CM

## 2019-03-06 NOTE — Telephone Encounter (Signed)

## 2019-03-06 NOTE — Patient Instructions (Addendum)
Medication Instructions:  Your physician recommends that you continue on your current medications as directed. Please refer to the Current Medication list given to you today.  *If you need a refill on your cardiac medications before your next appointment, please call your pharmacy*  Lab Work: Fasting lab work will be checked at office visit in 6 months. --Lipid and liver profiles and BMP If you have labs (blood work) drawn today and your tests are completely normal, you will receive your results only by: Marland Kitchen MyChart Message (if you have MyChart) OR . A paper copy in the mail If you have any lab test that is abnormal or we need to change your treatment, we will call you to review the results.  Testing/Procedures: none  Follow-Up: At [facility], you and your health needs are our priority.  As part of our continuing mission to provide you with exceptional heart care, we have created designated Provider Care Teams.  These Care Teams include your primary Cardiologist (physician) and Advanced Practice Providers (APPs -  Physician Assistants and Nurse Practitioners) who all work together to provide you with the care you need, when you need it.  Your next appointment:   6 month(s)  The format for your next appointment:   In Person  Provider:   You may see   or one of the following Advanced Practice Providers on your designated Care Team:         Richardson Dopp, PA-C       Daune Perch, NP   Other Instructions
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
## 2019-05-12 DIAGNOSIS — H409 Unspecified glaucoma: Secondary | ICD-10-CM | POA: Diagnosis not present

## 2019-05-12 DIAGNOSIS — R5383 Other fatigue: Secondary | ICD-10-CM | POA: Diagnosis not present

## 2019-05-12 DIAGNOSIS — I6389 Other cerebral infarction: Secondary | ICD-10-CM | POA: Diagnosis not present

## 2019-05-12 DIAGNOSIS — H8110 Benign paroxysmal vertigo, unspecified ear: Secondary | ICD-10-CM | POA: Diagnosis not present

## 2019-05-12 DIAGNOSIS — I69959 Hemiplegia and hemiparesis following unspecified cerebrovascular disease affecting unspecified side: Secondary | ICD-10-CM | POA: Diagnosis not present

## 2019-05-12 DIAGNOSIS — K589 Irritable bowel syndrome without diarrhea: Secondary | ICD-10-CM | POA: Diagnosis not present

## 2019-05-12 DIAGNOSIS — D6869 Other thrombophilia: Secondary | ICD-10-CM | POA: Diagnosis not present

## 2019-05-12 DIAGNOSIS — M1990 Unspecified osteoarthritis, unspecified site: Secondary | ICD-10-CM | POA: Diagnosis not present

## 2019-05-12 DIAGNOSIS — K219 Gastro-esophageal reflux disease without esophagitis: Secondary | ICD-10-CM | POA: Diagnosis not present

## 2019-05-12 DIAGNOSIS — I4891 Unspecified atrial fibrillation: Secondary | ICD-10-CM | POA: Diagnosis not present

## 2019-05-12 DIAGNOSIS — N390 Urinary tract infection, site not specified: Secondary | ICD-10-CM | POA: Diagnosis not present

## 2019-05-12 DIAGNOSIS — F329 Major depressive disorder, single episode, unspecified: Secondary | ICD-10-CM | POA: Diagnosis not present

## 2019-05-18 DIAGNOSIS — Z23 Encounter for immunization: Secondary | ICD-10-CM | POA: Diagnosis not present

## 2019-06-05 DIAGNOSIS — H401133 Primary open-angle glaucoma, bilateral, severe stage: Secondary | ICD-10-CM | POA: Diagnosis not present

## 2019-06-14 DIAGNOSIS — Z23 Encounter for immunization: Secondary | ICD-10-CM | POA: Diagnosis not present

## 2019-07-03 ENCOUNTER — Encounter (INDEPENDENT_AMBULATORY_CARE_PROVIDER_SITE_OTHER): Payer: Medicare Other | Admitting: Ophthalmology

## 2019-07-05 ENCOUNTER — Telehealth: Payer: Self-pay | Admitting: Infectious Diseases

## 2019-07-05 NOTE — Telephone Encounter (Signed)
Called re: homebound COVID vaccine program. Patient has already received Moderna through her PCP.    Will update our records through her [facility] Chart.

## 2019-10-04 ENCOUNTER — Encounter: Payer: Self-pay | Admitting: Neurology

## 2019-10-04 ENCOUNTER — Ambulatory Visit (INDEPENDENT_AMBULATORY_CARE_PROVIDER_SITE_OTHER): Payer: Medicare Other | Admitting: Neurology

## 2019-10-04 VITALS — BP 146/76 | HR 102 | Ht 66.0 in | Wt 157.0 lb

## 2019-10-04 DIAGNOSIS — G3184 Mild cognitive impairment, so stated: Secondary | ICD-10-CM | POA: Diagnosis not present

## 2019-10-04 DIAGNOSIS — R413 Other amnesia: Secondary | ICD-10-CM | POA: Diagnosis not present

## 2019-10-04 DIAGNOSIS — I639 Cerebral infarction, unspecified: Secondary | ICD-10-CM | POA: Diagnosis not present

## 2019-10-04 NOTE — Patient Instructions (Signed)
I had a long discussion with patient and her niece with regards to her recent embolic stroke  And new memory concerns which likeley represent mild cognitive impairmentwe will continue Eliquis for stroke prevention.  She was advised to maintain aggressive risk factor modification with strict control of hypertension with blood pressure goal below 130/90, lipids with LDL cholesterol goal below 70 mg percent and to eat a healthy diet and to be active and not gain weight.  Unfortunately due to patient's poor vision she is unable to perform a full cognitive battery and will find it difficult to do memory challenging tasks like solving crossword puzzles, playing bridge and sodoku.  I discussed memory compensation strategies.  I recommend we check lab work for reversible causes and consider trial of Aricept if her cognitive issues get worse and affect her daily functioning in the future.  She will return for follow-up in 6 months or call earlier if necessary. Memory Compensation Strategies  1. Use "WARM" strategy.  W= write it down  A= associate it  R= repeat it  M= make a mental note  2.   You can keep a Social worker.  Use a 3-ring notebook with sections for the following: calendar, important names and phone numbers,  medications, doctors' names/phone numbers, lists/reminders, and a section to journal what you did  each day.   3.    Use a calendar to write appointments down.  4.    Write yourself a schedule for the day.  This can be placed on the calendar or in a separate section of the Memory Notebook.  Keeping a  regular schedule can help memory.  5.    Use medication organizer with sections for each day or morning/evening pills.  You may need help loading it  6.    Keep a basket, or pegboard by the door.  Place items that you need to take out with you in the basket or on the pegboard.  You may also want to  include a message board for reminders.  7.    Use sticky notes.  Place sticky notes  with reminders in a place where the task is performed.  For example: " turn off the  stove" placed by the stove, "lock the door" placed on the door at eye level, " take your medications" on  the bathroom mirror or by the place where you normally take your medications.  8.    Use alarms/timers.  Use while cooking to remind yourself to check on food or as a reminder to take your medicine, or as a  reminder to make a call, or as a reminder to perform another task, etc.

## 2019-10-04 NOTE — Progress Notes (Signed)
[facility] [address] [place]. [place] [address] [phone]       OFFICE FOLLOW-UP NOTE  [name] Date of Birth:  13-May-1928 Medical Record Number:  [id number]   HPI: Initial virtual video visit 02/09/2019 ; Amanda Cochran is evaluated today for virtual telephonic visit following recent hospital admission for stroke in November 2020.  She has a past medical history of hypertension, hyperlipidemia left basal ganglia stroke in December 2015, atrial fibrillation diagnosed 2 years ago and hypothyroidism.  On 01/11/2019 she developed sudden onset of slurred speech while talking to a friend over the phone.  EMS was called and they noticed slurred speech and brought her to the emergency room where her symptoms recovered.  She has blindness at baseline secondary to macular degeneration and glaucoma.  CT scan of the head in the emergency room was unremarkable subsequently MRI scan showed small embolic left temporal MCA branch infarcts.  Transthoracic echo showed normal ejection fraction.  LDL cholesterol is elevated at 130 mg percent and hemoglobin A1c was 5.9.  Patient was on Eliquis for stroke prevention after discussion about alternatives and risk benefits she was switched to Pradaxa however after she reached home she realized she could not afford it as her co-pay was $200 a month.  She went back to taking Eliquis and is tolerating it well without bruising or bleeding.  She was also started on Lipitor 40 mg which she is tolerating well without side effects.  She states her blood pressure is under good control.  She has had no recurrent TIA or stroke symptoms.  She remains alone and has some help from her neighbors.  She does not have anybody to write with to go for her medical follow-up appointments. Update 10/04/2018 : She returns for follow-up after virtual video visit 8 months ago.  She is accompanied by her niece Amanda Cochran.  Patient states she has done well and not had any recurrent  stroke or TIA symptoms.  She remains on Eliquis which is tolerating well with minor bruising but no major bleeding episodes.  She is tolerating Lipitor well without muscle aches and pains.  Her blood pressure is well controlled today it is 140/76.  She is more concerned today about her memory which seems to be getting worse.  She has trouble remembering with information and misplaces objects.  She also has trouble finding words and completing sentences and is to stop midsentence.  She is worried about dementia.  She is still living alone.  She has very limited vision and is nearly blind.  She is able to manage at home in familiar surroundings but needs help to go out.  She has not had any lab work to look for reversible causes of memory loss.  She is not willing to try Aricept at the present time ROS:   14 system review of systems is positive for poor vision, memory loss, word finding difficulties, bruising gait difficulties and all other systems negative PMH:  Past Medical History:  Diagnosis Date  . Arthritis   . Bladder infection   . CVA (cerebral infarction) 02/07/2014   Left basal ganglia infarct with right hemiparesis   . Glaucoma   . Glaucoma   . Glaucoma (increased eye pressure)   . Hyperlipidemia   . Hypertension   . Left sided lacunar stroke ([place]) 02/14/2014  . Right hemiparesis ([place]) 02/09/2014   Left basal ganglia infarct Dec 2015   . Stroke ([place])   . Thyroid disease  Social History:  Social History   Socioeconomic History  . Marital status: Widowed    Spouse name: deceased  . Number of children: 0  . Years of education: 76  . Highest education level: Not on file  Occupational History  . Occupation: retired    Comment: Glass blower/designer  Tobacco Use  . Smoking status: Never Smoker  . Smokeless tobacco: Never Used  Vaping Use  . Vaping Use: Never used  Substance and Sexual Activity  . Alcohol use: No  . Drug use: No  . Sexual activity: Not on file  Other Topics  Concern  . Not on file  Social History Narrative   Widowed, lives alone   Caffeine use- 1 cup coffee daily   Right handed   No children   Social Determinants of Health   Financial Resource Strain:   . Difficulty of Paying Living Expenses:   Food Insecurity:   . Worried About Charity fundraiser in the Last Year:   . Arboriculturist in the Last Year:   Transportation Needs:   . Film/video editor (Medical):   Marland Kitchen Lack of Transportation (Non-Medical):   Physical Activity:   . Days of Exercise per Week:   . Minutes of Exercise per Session:   Stress:   . Feeling of Stress :   Social Connections:   . Frequency of Communication with Friends and Family:   . Frequency of Social Gatherings with Friends and Family:   . Attends Religious Services:   . Active Member of Clubs or Organizations:   . Attends Archivist Meetings:   Marland Kitchen Marital Status:   Intimate Partner Violence:   . Fear of Current or Ex-Partner:   . Emotionally Abused:   Marland Kitchen Physically Abused:   . Sexually Abused:     Medications:   Current Outpatient Medications on File Prior to Visit  Medication Sig Dispense Refill  . amLODipine (NORVASC) 5 MG tablet Take 1 tablet by mouth daily.    . benazepril (LOTENSIN) 20 MG tablet Take 20 mg by mouth daily.    . Cholecalciferol (VITAMIN D) 2000 UNITS CAPS Take 1 capsule by mouth daily.    . dorzolamide-timolol (COSOPT) 22.3-6.8 MG/ML ophthalmic solution Place 1 drop into both eyes 2 (two) times daily.    Marland Kitchen ELIQUIS 5 MG TABS tablet Take 5 mg by mouth 2 (two) times daily.    . hydrALAZINE (APRESOLINE) 25 MG tablet Take 1 tablet (25 mg total) by mouth every 8 (eight) hours. 90 tablet 1  . latanoprost (XALATAN) 0.005 % ophthalmic solution Place 1 drop into both eyes at bedtime.     Marland Kitchen levothyroxine (SYNTHROID, LEVOTHROID) 100 MCG tablet Take 1 tablet (100 mcg total) by mouth daily. (Patient taking differently: Take 100 mcg by mouth daily before breakfast. ) 30 tablet 1  .  Multiple Vitamins-Minerals (CENTRUM SILVER PO) Take 1 tablet by mouth daily.    . Omega-3 Fatty Acids (FISH OIL) 1000 MG CAPS Take 1,000 mg by mouth daily.     Marland Kitchen Petrolatum OINT Apply thin coat of Vaseline to lids prior to drops    . pilocarpine (PILOCAR) 1 % ophthalmic solution Place 1 drop into both eyes 4 (four) times daily.   10  . Ascorbic Acid (VITAMIN C PO) Take 1 tablet by mouth daily. (Patient not taking: Reported on 10/04/2019)    . atorvastatin (LIPITOR) 40 MG tablet Take 1 tablet (40 mg total) by mouth daily at 6 PM. 30  tablet 0   No current facility-administered medications on file prior to visit.    Allergies:  No Known Allergies  Physical Exam General: Frail elderly Caucasian lady, seated, in no evident distress Head: head normocephalic and atraumatic.  Neck: supple with no carotid or supraclavicular bruits Cardiovascular: regular rate and rhythm, no murmurs Musculoskeletal: no deformity Skin:  no rash/petichiae Vascular:  Normal pulses all extremities Vitals:   10/04/19 1532  BP: (!) 146/76  Pulse: (!) 102   Neurologic Exam Mental Status: Awake and fully alert. Oriented to place and time. Recent and remote memory poor attention span, concentration and fund of knowledge diminished mood and affect appropriate.  Unable to do do Mini-Mental status exam due to legal blindness and poor vision.  Diminished recall 0/3.  Able to name 11 animals which can walk on 4 legs.   Cranial Nerves: Fundoscopic exam not done.  Legally blind with motion detection and light perception bilaterally.  Unable to read and count fingers.  Extraocular movements full without nystagmus. Visual fields full to confrontation. Hearing intact. Facial sensation intact. Face, tongue, palate moves normally and symmetrically.  Motor: Normal bulk and tone. Normal strength in all tested extremity muscles. Sensory.: intact to touch ,pinprick .position and vibratory sensation.  Coordination: Rapid alternating  movements normal in all extremities. Finger-to-nose and heel-to-shin performed accurately bilaterally. Gait and Station: Arises from chair with slight difficulty. Stance is mildly stooped .  Able to walk with a cane with a cautious gait not able to heel, toe and tandem walk   Reflexes: 1+ and symmetric. Toes downgoing.      ASSESSMENT:86 year old lady with embolic left MCA branch infarct in November 2020 secondary to atrial fibrillation despite being on anticoagulation with Eliquis.  Vascular risk factors of hypertension, hyperlipidemia, atrial fibrillation and previous left basal ganglia infarct in December 2015.  New complaints of memory loss and speech difficulties likely due to age-appropriate mild cognitive impairment.     PLAN: I had a long discussion with patient and her niece with regards to her recent embolic stroke  And new memory concerns which likeley represent mild cognitive impairmentwe will continue Eliquis for stroke prevention.  She was advised to maintain aggressive risk factor modification with strict control of hypertension with blood pressure goal below 130/90, lipids with LDL cholesterol goal below 70 mg percent and to eat a healthy diet and to be active and not gain weight.  Unfortunately due to patient's poor vision she is unable to perform a full cognitive battery and will find it difficult to do memory challenging tasks like solving crossword puzzles, playing bridge and sodoku.  Patient is not willing to try Aricept at the present time.  I discussed memory compensation strategies.  I recommend we check lab work for reversible causes and consider trial of Aricept if her cognitive issues get worse and affect her daily functioning in the future.  She will return for follow-up in 6 months or call earlier if necessary. Greater than 50% of time during this 35 minute visit was spent on counseling,explanation of diagnosis of memory loss, mild cognitive impairment and embolic stroke,  planning of further management, discussion with patient and family and coordination of care Antony Contras, MD  [facility] [address] [place], [place] [address]  Phone [phone] Fax [phone] Note: This document was prepared with digital dictation and possible smart phrase technology. Any transcriptional errors that result from this process are unintentional

## 2019-10-05 ENCOUNTER — Ambulatory Visit (INDEPENDENT_AMBULATORY_CARE_PROVIDER_SITE_OTHER): Payer: Medicare Other

## 2019-10-05 ENCOUNTER — Encounter (INDEPENDENT_AMBULATORY_CARE_PROVIDER_SITE_OTHER): Payer: Self-pay | Admitting: Ophthalmology

## 2019-10-05 ENCOUNTER — Ambulatory Visit (INDEPENDENT_AMBULATORY_CARE_PROVIDER_SITE_OTHER): Payer: Medicare Other | Admitting: Ophthalmology

## 2019-10-05 ENCOUNTER — Encounter: Payer: Self-pay | Admitting: Orthopaedic Surgery

## 2019-10-05 ENCOUNTER — Ambulatory Visit (INDEPENDENT_AMBULATORY_CARE_PROVIDER_SITE_OTHER): Payer: Medicare Other | Admitting: Orthopaedic Surgery

## 2019-10-05 ENCOUNTER — Other Ambulatory Visit: Payer: Self-pay

## 2019-10-05 VITALS — Ht 66.0 in | Wt 157.0 lb

## 2019-10-05 DIAGNOSIS — H4720 Unspecified optic atrophy: Secondary | ICD-10-CM | POA: Insufficient documentation

## 2019-10-05 DIAGNOSIS — M545 Low back pain, unspecified: Secondary | ICD-10-CM

## 2019-10-05 DIAGNOSIS — H353132 Nonexudative age-related macular degeneration, bilateral, intermediate dry stage: Secondary | ICD-10-CM | POA: Diagnosis not present

## 2019-10-05 DIAGNOSIS — I639 Cerebral infarction, unspecified: Secondary | ICD-10-CM

## 2019-10-05 DIAGNOSIS — H401133 Primary open-angle glaucoma, bilateral, severe stage: Secondary | ICD-10-CM

## 2019-10-05 DIAGNOSIS — M5441 Lumbago with sciatica, right side: Secondary | ICD-10-CM | POA: Diagnosis not present

## 2019-10-05 DIAGNOSIS — M5442 Lumbago with sciatica, left side: Secondary | ICD-10-CM | POA: Diagnosis not present

## 2019-10-05 DIAGNOSIS — R443 Hallucinations, unspecified: Secondary | ICD-10-CM

## 2019-10-05 DIAGNOSIS — G8929 Other chronic pain: Secondary | ICD-10-CM

## 2019-10-05 LAB — DEMENTIA PANEL
Homocysteine: 10.7 umol/L (ref 0.0–21.3)
RPR Ser Ql: NONREACTIVE
TSH: 1.37 u[IU]/mL (ref 0.450–4.500)
Vitamin B-12: 786 pg/mL (ref 232–1245)

## 2019-10-05 NOTE — Progress Notes (Signed)
10/05/2019     CHIEF COMPLAINT Patient presents for Retina Follow Up   HISTORY OF PRESENT ILLNESS: Amanda Cochran is a 86 y.o. female who presents to the clinic today for:   HPI    Retina Follow Up    Patient presents with  Dry AMD.  In both eyes.  Severity is moderate.  Duration of 2 years.  Since onset it is stable.  I, the attending physician,  performed the HPI with the patient and updated documentation appropriately.          Comments    2 Year Dry AMD f\u OU. OCT  Pt states vision has decreased. Pt sees Dr. Edilia Bo for glaucoma.       Last edited by Tilda Franco on 10/05/2019  9:41 AM. (History)      Referring physician: Crist Infante, MD [place],  [place] [address]  HISTORICAL INFORMATION:   Selected notes from the MEDICAL RECORD NUMBER    Lab Results  Component Value Date   HGBA1C 5.9 (H) 01/13/2019     CURRENT MEDICATIONS: Current Outpatient Medications (Ophthalmic Drugs)  Medication Sig  . dorzolamide-timolol (COSOPT) 22.3-6.8 MG/ML ophthalmic solution Place 1 drop into both eyes 2 (two) times daily.  Marland Kitchen latanoprost (XALATAN) 0.005 % ophthalmic solution Place 1 drop into both eyes at bedtime.   . pilocarpine (PILOCAR) 1 % ophthalmic solution Place 1 drop into both eyes 4 (four) times daily.    No current facility-administered medications for this visit. (Ophthalmic Drugs)   Current Outpatient Medications (Other)  Medication Sig  . amLODipine (NORVASC) 5 MG tablet Take 1 tablet by mouth daily.  . Ascorbic Acid (VITAMIN C PO) Take 1 tablet by mouth daily. (Patient not taking: Reported on 10/04/2019)  . atorvastatin (LIPITOR) 40 MG tablet Take 1 tablet (40 mg total) by mouth daily at 6 PM.  . benazepril (LOTENSIN) 20 MG tablet Take 20 mg by mouth daily.  . Cholecalciferol (VITAMIN D) 2000 UNITS CAPS Take 1 capsule by mouth daily.  Marland Kitchen ELIQUIS 5 MG TABS tablet Take 5 mg by mouth 2 (two) times daily.  . hydrALAZINE (APRESOLINE) 25 MG tablet Take  1 tablet (25 mg total) by mouth every 8 (eight) hours.  Marland Kitchen levothyroxine (SYNTHROID, LEVOTHROID) 100 MCG tablet Take 1 tablet (100 mcg total) by mouth daily. (Patient taking differently: Take 100 mcg by mouth daily before breakfast. )  . Multiple Vitamins-Minerals (CENTRUM SILVER PO) Take 1 tablet by mouth daily.  . Omega-3 Fatty Acids (FISH OIL) 1000 MG CAPS Take 1,000 mg by mouth daily.   Marland Kitchen Petrolatum OINT Apply thin coat of Vaseline to lids prior to drops   No current facility-administered medications for this visit. (Other)      REVIEW OF SYSTEMS:    ALLERGIES No Known Allergies  PAST MEDICAL HISTORY Past Medical History:  Diagnosis Date  . Arthritis   . Bladder infection   . CVA (cerebral infarction) 02/07/2014   Left basal ganglia infarct with right hemiparesis   . Glaucoma   . Glaucoma   . Glaucoma (increased eye pressure)   . Hyperlipidemia   . Hypertension   . Left sided lacunar stroke ([facility]) 02/14/2014  . Right hemiparesis ([facility]) 02/09/2014   Left basal ganglia infarct Dec 2015   . Stroke ([facility])   . Thyroid disease    Past Surgical History:  Procedure Laterality Date  . cataract surgery Bilateral 2015  . THYROID SURGERY  1976  . TONSILLECTOMY AND ADENOIDECTOMY    .  VAGINAL PROLAPSE REPAIR      FAMILY HISTORY Family History  Problem Relation Age of Onset  . Heart failure Mother   . AAA (abdominal aortic aneurysm) Father     SOCIAL HISTORY Social History   Tobacco Use  . Smoking status: Never Smoker  . Smokeless tobacco: Never Used  Vaping Use  . Vaping Use: Never used  Substance Use Topics  . Alcohol use: No  . Drug use: No         OPHTHALMIC EXAM:  Base Eye Exam    Visual Acuity (Snellen - Linear)      Right Left   Dist [place] HM HM       Tonometry (Tonopen, 9:47 AM)      Right Left   Pressure 14 12       Pupils      Pupils Dark Light Shape React APD   Right PERRL 3 3 Round Minimal None   Left PERRL 3 3 Round Minimal None        Visual Fields (Counting fingers)      Left Right   Restrictions Total superior temporal, inferior temporal, superior nasal, inferior nasal deficiencies Total superior temporal, inferior temporal, superior nasal, inferior nasal deficiencies       Neuro/Psych    Oriented x3: Yes   Mood/Affect: Normal       Dilation    Both eyes: 1.0% Mydriacyl, 2.5% Phenylephrine @ 9:47 AM        Slit Lamp and Fundus Exam    External Exam      Right Left   External Normal Normal       Slit Lamp Exam      Right Left   Lids/Lashes Normal Normal   Conjunctiva/Sclera White and quiet White and quiet   Cornea Clear Clear   Anterior Chamber Deep and quiet Deep and quiet   Iris Round and reactive Round and reactive   Lens Centered posterior chamber intraocular lens Centered posterior chamber intraocular lens   Anterior Vitreous Normal Normal       Fundus Exam      Right Left   Posterior Vitreous Normal Normal   Disc Normal Normal   C/D Ratio 1.0 0.95   Macula Hard drusen, no hemorrhage, no macular thickening Hard drusen, no hemorrhage, no macular thickening   Vessels Normal Normal   Periphery Normal Normal          IMAGING AND PROCEDURES  Imaging and Procedures for 10/05/19  OCT, Retina - OU - Both Eyes       Right Eye Quality was borderline. Scan locations included subfoveal. Progression has been stable. Findings include normal observations.   Left Eye Quality was borderline. Scan locations included subfoveal. Progression has been stable. Findings include normal observations.   Notes  retinal nerve fiber layer atrophy in each eye, stable over time                ASSESSMENT/PLAN:  No problem-specific Assessment & Plan notes found for this encounter.      ICD-10-CM   1. Pseudohallucination  R44.3   2. Intermediate stage nonexudative age-related macular degeneration of both eyes  H35.3132 OCT, Retina - OU - Both Eyes  3. Optic disc atrophy  H47.20   4. Primary open  angle glaucoma of both eyes, severe stage  H40.1133     1.  2.  3.  Ophthalmic Meds Ordered this visit:  No orders of the defined types were placed in this  encounter.      Return in about 2 years (around 10/04/2021) for DILATE OU, COLOR FP.  There are no Patient Instructions on file for this visit.   Explained the diagnoses, plan, and follow up with the patient and they expressed understanding.  Patient expressed understanding of the importance of proper follow up care.   Clent Demark  M.D. Diseases & Surgery of the Retina and Vitreous [facility] [place] 10/05/19     Abbreviations: M myopia (nearsighted); A astigmatism; H hyperopia (farsighted); P presbyopia; Mrx spectacle prescription;  CTL contact lenses; OD right eye; OS left eye; OU both eyes  XT exotropia; ET esotropia; PEK punctate epithelial keratitis; PEE punctate epithelial erosions; DES dry eye syndrome; MGD meibomian gland dysfunction; ATs artificial tears; PFAT's preservative free artificial tears; [place] nuclear sclerotic cataract; PSC posterior subcapsular cataract; ERM epi-retinal membrane; PVD posterior vitreous detachment; RD retinal detachment; DM diabetes mellitus; DR diabetic retinopathy; NPDR non-proliferative diabetic retinopathy; PDR proliferative diabetic retinopathy; CSME clinically significant macular edema; DME diabetic macular edema; dbh dot blot hemorrhages; CWS cotton wool spot; POAG primary open angle glaucoma; C/D cup-to-disc ratio; HVF humphrey visual field; GVF goldmann visual field; OCT optical coherence tomography; IOP intraocular pressure; BRVO Branch retinal vein occlusion; CRVO central retinal vein occlusion; CRAO central retinal artery occlusion; BRAO branch retinal artery occlusion; RT retinal tear; SB scleral buckle; PPV pars plana vitrectomy; VH Vitreous hemorrhage; PRP panretinal laser photocoagulation; IVK intravitreal kenalog; VMT vitreomacular traction; MH Macular hole;  NVD  neovascularization of the disc; NVE neovascularization elsewhere; AREDS age related eye disease study; ARMD age related macular degeneration; POAG primary open angle glaucoma; EBMD epithelial/anterior basement membrane dystrophy; ACIOL anterior chamber intraocular lens; IOL intraocular lens; PCIOL posterior chamber intraocular lens; Phaco/IOL phacoemulsification with intraocular lens placement; [place] photorefractive keratectomy; LASIK laser assisted in situ keratomileusis; HTN hypertension; DM diabetes mellitus; COPD chronic obstructive pulmonary disease

## 2019-10-05 NOTE — Progress Notes (Signed)
Office Visit Note   Patient: Amanda Cochran           Date of Birth: Oct 03, 1928           MRN: [id number] Visit Date: 10/05/2019              Requested by: Crist Infante, MD [address] [place],  [place] [address] PCP: Crist Infante, MD   Assessment & Plan: Visit Diagnoses:  1. Chronic right-sided low back pain, unspecified whether sciatica present   2. Chronic bilateral low back pain with bilateral sciatica     Plan: Amanda Cochran is having recurrent low back pain similar to what she had in the past.  She responded nicely to epidural steroid injection and will schedule the same for her.  She did not want to pursue any further diagnostic testing at this point  Follow-Up Instructions: Return After ESI.   Orders:  Orders Placed This Encounter  Procedures  . XR Lumbar Spine 2-3 Views  . Ambulatory referral to Physical Medicine Rehab   No orders of the defined types were placed in this encounter.     Procedures: No procedures performed   Clinical Data: No additional findings.   Subjective: Chief Complaint  Patient presents with  . Lower Back - Pain  Patient presents today for lower back pain. She said that it has been hurting for years. She has pain that travels down the lateral side of her upper leg. No numbness, tingling, or weakness in her legs. She takes Tylenol if needed. No previous back surgery. She has received epidural steroid injections in the past and states that they help. She last saw Dr. in 2018 for her back. Her last injection was on 10/14/2016 and it helped until a couple months ago.  Multiple comorbidities.  Has had to miss "mini strokes" recently and does use ambulatory aid.  Also is "blind".  HPI  Review of Systems   Objective: Vital Signs: Ht 5\' 6"  (1.676 m)   Wt 157 lb (71.2 kg)   BMI 25.34 kg/m   Physical Exam Constitutional:      Appearance: She is well-developed.  Eyes:     Pupils: Pupils are equal, round, and reactive to light.    Pulmonary:     Effort: Pulmonary effort is normal.  Skin:    General: Skin is warm and dry.  Neurological:     Mental Status: She is alert and oriented to person, place, and time.  Psychiatric:        Behavior: Behavior normal.     Ortho Exam awake alert and oriented x3.  Very comfortable and pleasant.  Straight leg raise negative bilaterally.  Relates having normal sensation of both of her feet.  Mild bilateral ankle  nonpitting edema.  Motors  appear to be intact.  No percussible tenderness of lumbar spine  Specialty Comments:  No specialty comments available.  Imaging: OCT, Retina - OU - Both Eyes  Result Date: 10/05/2019 Right Eye Quality was borderline. Scan locations included subfoveal. Progression has been stable. Findings include normal observations. Left Eye Quality was borderline. Scan locations included subfoveal. Progression has been stable. Findings include normal observations. Notes  retinal nerve fiber layer atrophy in each eye, stable over time  XR Lumbar Spine 2-3 Views  Result Date: 10/05/2019 Films of the lumbar spine obtained in 2 projections.  There is slight straightening of the normal lordotic curve.  There is a grade 2 spondylolisthesis of L5 on S1.  Considerable degenerative  disc narrowing at L4-5 and L5-S1 with facet changes at L4-5 and L5-S1.  No scoliosis.  Diffuse calcification of the abdominal aorta without obvious aneurysmal dilatation    PMFS History: Patient Active Problem List   Diagnosis Date Noted  . Pseudohallucination 10/05/2019  . Intermediate stage nonexudative age-related macular degeneration of both eyes 10/05/2019  . Optic disc atrophy 10/05/2019  . Primary open angle glaucoma of both eyes, severe stage 10/05/2019  . Low back pain 10/05/2019  . MCI (mild cognitive impairment) 10/04/2019  . Acute CVA (cerebrovascular accident) ([facility]) 01/13/2019  . Atrial fibrillation ([facility]) 11/03/2018  . Left sided lacunar stroke ([facility]) 02/14/2014  .  Right hemiparesis ([facility]) 02/09/2014  . Cerebral infarction ([facility]) 02/07/2014  . Hyperlipidemia   . Essential hypertension 02/03/2014   Past Medical History:  Diagnosis Date  . Arthritis   . Bladder infection   . CVA (cerebral infarction) 02/07/2014   Left basal ganglia infarct with right hemiparesis   . Glaucoma   . Glaucoma   . Glaucoma (increased eye pressure)   . Hyperlipidemia   . Hypertension   . Left sided lacunar stroke ([facility]) 02/14/2014  . Right hemiparesis ([facility]) 02/09/2014   Left basal ganglia infarct Dec 2015   . Stroke ([facility])   . Thyroid disease     Family History  Problem Relation Age of Onset  . Heart failure Mother   . AAA (abdominal aortic aneurysm) Father     Past Surgical History:  Procedure Laterality Date  . cataract surgery Bilateral 2015  . THYROID SURGERY  1976  . TONSILLECTOMY AND ADENOIDECTOMY    . VAGINAL PROLAPSE REPAIR     Social History   Occupational History  . Occupation: retired    Comment: Glass blower/designer  Tobacco Use  . Smoking status: Never Smoker  . Smokeless tobacco: Never Used  Vaping Use  . Vaping Use: Never used  Substance and Sexual Activity  . Alcohol use: No  . Drug use: No  . Sexual activity: Not on file

## 2019-10-13 ENCOUNTER — Telehealth: Payer: Self-pay | Admitting: Physical Medicine and Rehabilitation

## 2019-10-13 NOTE — Progress Notes (Signed)
Kindly inform the patient that lab work for reversible causes of memory loss was normal

## 2019-10-13 NOTE — Telephone Encounter (Signed)
Referral. Scheduled. Will hold Eliquis for 2 days.

## 2019-10-13 NOTE — Telephone Encounter (Signed)
Received call from Patient's niece she advised was returning call concerning blood thinners for her aunt. The number to contact [name] is [phone]

## 2019-10-16 ENCOUNTER — Telehealth: Payer: Self-pay | Admitting: *Deleted

## 2019-10-16 NOTE — Telephone Encounter (Signed)
Spoke with patient and informed her lab work for reversible causes of memory loss was normal.  Patient verbalized understanding, appreciation.

## 2019-10-24 ENCOUNTER — Encounter: Payer: Self-pay | Admitting: Physical Medicine and Rehabilitation

## 2019-10-24 ENCOUNTER — Ambulatory Visit: Payer: Self-pay

## 2019-10-24 ENCOUNTER — Other Ambulatory Visit: Payer: Self-pay

## 2019-10-24 ENCOUNTER — Ambulatory Visit (INDEPENDENT_AMBULATORY_CARE_PROVIDER_SITE_OTHER): Payer: Medicare Other | Admitting: Physical Medicine and Rehabilitation

## 2019-10-24 VITALS — BP 168/85 | HR 87

## 2019-10-24 DIAGNOSIS — M47816 Spondylosis without myelopathy or radiculopathy, lumbar region: Secondary | ICD-10-CM

## 2019-10-24 DIAGNOSIS — G8929 Other chronic pain: Secondary | ICD-10-CM

## 2019-10-24 MED ORDER — METHYLPREDNISOLONE ACETATE 80 MG/ML IJ SUSP
80.0000 mg | Freq: Once | INTRAMUSCULAR | Status: AC
  Administered 2019-10-24: 80 mg

## 2019-10-24 NOTE — Progress Notes (Signed)
Pt states middle back pain. Pt states walking and sitting makes her pain worse. Pt state laying down helps a little with pain.  Numeric Pain Rating Scale and Functional Assessment Average Pain 5   In the last MONTH (on 0-10 scale) has pain interfered with the following?  1. General activity like being  able to carry out your everyday physical activities such as walking, climbing stairs, carrying groceries, or moving a chair?  Rating(6)   +Driver, +BT, -Dye Allergies.

## 2019-11-13 DIAGNOSIS — H401133 Primary open-angle glaucoma, bilateral, severe stage: Secondary | ICD-10-CM | POA: Diagnosis not present

## 2019-11-14 NOTE — Progress Notes (Addendum)
[name] - 86 y.o. female MRN [id number]  Date of birth: 21-May-1928  Office Visit Note: Visit Date: 10/24/2019 PCP: Crist Infante, MD Referred by: Crist Infante, MD  Subjective: Chief Complaint  Patient presents with  . Middle Back - Pain   HPI:  Amanda Cochran is a 86 y.o. female who comes in today at the request of Dr. Joni Fears for planned Right L3-4, L4-5 and L5-S1 lumbar facet/medial branch block with fluoroscopic guidance.  The patient has failed conservative care including home exercise, medications, time and activity modification.  This injection will be diagnostic and hopefully therapeutic.  Please see requesting physician notes for further details and justification.  Exam has shown concordant pain with facet joint loading.  MRI reviewed with images and spine model.  MRI reviewed in the note below.  MRI reviewed with the patient today shows multilevel facet arthropathy but in particular at L3-4 and L4-5 with facet joint cyst on the right at L3-4.  She does have moderate multifactorial narrowing of the canal and lateral recesses in these areas as well.  No real radicular complaints.  Case is complicated by history of prior stroke and on chronic anticoagulation.  Depending on relief diagnostically with medial branch blocks and facet joint injections at levels above on the right could look at epidural injection.  Please note that the procedure note below shows only L3-4 and L4 4-5 facet joint levels but we did complete a third level at L5-S1.  ROS Otherwise per HPI.  Assessment & Plan: Visit Diagnoses:  1. Spondylosis without myelopathy or radiculopathy, lumbar region   2. Chronic right-sided low back pain without sciatica     Plan: No additional findings.   Meds & Orders:  Meds ordered this encounter  Medications  . methylPREDNISolone acetate (DEPO-MEDROL) injection 80 mg    Orders Placed This Encounter  Procedures  . Facet Injection  . XR C-ARM NO REPORT     Follow-up: Return if symptoms worsen or fail to improve.   Procedures: No procedures performed  Lumbar Facet Joint Intra-Articular Injection(s) with Fluoroscopic Guidance  Patient: Amanda Cochran      Date of Birth: 1928/09/18 MRN: [id number] PCP: Crist Infante, MD      Visit Date: 10/24/2019   Universal Protocol:    Date/Time: 10/24/2019  Consent Given By: the patient  Position: PRONE   Additional Comments: Vital signs were monitored before and after the procedure. Patient was prepped and draped in the usual sterile fashion. The correct patient, procedure, and site was verified.   Injection Procedure Details:  Procedure Site One Meds Administered:  Meds ordered this encounter  Medications  . methylPREDNISolone acetate (DEPO-MEDROL) injection 80 mg     Laterality: Right  Location/Site:  L3-L4 L4-L5  Needle size: 22 guage  Needle type: Spinal  Needle Placement: Articular  Findings:  -Comments: Excellent flow of contrast producing a partial arthrogram.  Procedure Details: The fluoroscope beam is vertically oriented in AP, and the inferior recess is visualized beneath the lower pole of the inferior apophyseal process, which represents the target point for needle insertion. When direct visualization is difficult the target point is located at the medial projection of the vertebral pedicle. The region overlying each aforementioned target is locally anesthetized with a 1 to 2 ml. volume of 1% Lidocaine without Epinephrine.   The spinal needle was inserted into each of the above mentioned facet joints using biplanar fluoroscopic guidance. A 0.25 to 0.5 ml. volume of Isovue-250 was  injected and a partial facet joint arthrogram was obtained. A single spot film was obtained of the resulting arthrogram.    One to 1.25 ml of the steroid/anesthetic solution was then injected into each of the facet joints noted above.   Additional Comments:  The patient tolerated the procedure  well Dressing: 2 x 2 sterile gauze and Band-Aid    Post-procedure details: Patient was observed during the procedure. Post-procedure instructions were reviewed.  Patient left the clinic in stable condition.     Clinical History: MRI LUMBAR SPINE WITHOUT CONTRAST  TECHNIQUE: Multiplanar, multisequence MR imaging of the lumbar spine was performed. No intravenous contrast was administered.  COMPARISON:  Lumbar spine x-rays dated August 11, 2016; CT abdomen and pelvis dated February 04, 2006.  FINDINGS: Segmentation:  Standard.  Alignment: 5 mm anterolisthesis of L5 on S1. Trace anterolisthesis of L2 on L3.  Vertebrae:  No fracture, evidence of discitis, or bone lesion.  Conus medullaris: Extends to the L1 level and appears normal.  Paraspinal and other soft tissues: Negative.  Disc levels:  T10-T11: Only seen on the sagittal views. No spinal canal or neuroforaminal stenosis.  T11-T12: Only seen on the sagittal views. Mild diffuse disc bulge and bilateral facet arthropathy. No spinal canal or neuroforaminal stenosis.  T12-L1: Mild facet arthropathy. No spinal canal or neuroforaminal stenosis.  L1-L2: Diffuse disc bulge with superimposed central and left paracentral disc protrusion. No spinal canal or neuroforaminal stenosis.  L2-L3: Diffuse disc bulge with superimposed central and paracentral disc extrusion migrating superiorly. Severe bilateral facet arthropathy. Findings result in moderate central spinal canal stenosis and prominent narrowing of the posterior thecal sac. No neuroforaminal stenosis.  L3-L4: Diffuse disc bulge and severe bilateral facet arthropathy with prominent ligamentum flavum hypertrophy resulting in moderate central spinal canal stenosis and narrowing of the bilateral lateral recesses. There is a 7 mm right-sided synovial cyst severely narrowing the right neural foramen and impinging upon the exiting right L3 nerve root.  Superimposed left foraminal disc protrusion may abut the exiting left L3 nerve root.  L4-L5: Diffuse disc bulge and moderate bilateral facet arthropathy resulting in mild bilateral neuroforaminal stenosis. No central spinal canal stenosis.  L5-S1: Disc uncovering and anterolisthesis resulting in severe bilateral neuroforaminal stenosis.  IMPRESSION: 1. Multilevel degenerative changes of the lumbar spine as described above. Grade 1 anterolisthesis at L5-S1 results in severe bilateral neuroforaminal stenosis. 2. Moderate central spinal canal stenosis at L2-L3 and L3-L4. 3. Severe right neuroforaminal stenosis at L3-L4 due to a 7 mm right-sided synovial cyst impinging upon the exiting right L3 nerve root.   Electronically Signed   By: Titus Dubin M.D.   On: 09/23/2016 16:23     Objective:  VS:  HT:    WT:   BMI:     BP:(!) 168/85  HR:87bpm  TEMP: ( )  RESP:  Physical Exam Constitutional:      General: She is not in acute distress.    Appearance: Normal appearance. She is not ill-appearing.  HENT:     Head: Normocephalic and atraumatic.     Right Ear: External ear normal.     Left Ear: External ear normal.  Eyes:     Extraocular Movements: Extraocular movements intact.  Cardiovascular:     Rate and Rhythm: Normal rate.     Pulses: Normal pulses.  Musculoskeletal:     Right lower leg: No edema.     Left lower leg: No edema.     Comments: Patient has good distal strength  with no pain over the greater trochanters.  No clonus or focal weakness.Patient somewhat slow to rise from a seated position to full extension.  There is concordant low back pain with facet loading and lumbar spine extension rotation.  There are no definitive trigger points but the patient is somewhat tender across the lower back and PSIS.  There is no pain with hip rotation.   Skin:    Findings: No erythema, lesion or rash.  Neurological:     General: No focal deficit present.     Mental  Status: She is alert and oriented to person, place, and time.     Sensory: No sensory deficit.     Motor: No weakness or abnormal muscle tone.     Coordination: Coordination normal.  Psychiatric:        Mood and Affect: Mood normal.        Behavior: Behavior normal.      Imaging: No results found.

## 2019-11-14 NOTE — Procedures (Signed)
Lumbar Facet Joint Intra-Articular Injection(s) with Fluoroscopic Guidance  Patient: Amanda Cochran      Date of Birth: November 02, 1928 MRN: [id number] PCP: Crist Infante, MD      Visit Date: 10/24/2019   Universal Protocol:    Date/Time: 10/24/2019  Consent Given By: the patient  Position: PRONE   Additional Comments: Vital signs were monitored before and after the procedure. Patient was prepped and draped in the usual sterile fashion. The correct patient, procedure, and site was verified.   Injection Procedure Details:  Procedure Site One Meds Administered:  Meds ordered this encounter  Medications  . methylPREDNISolone acetate (DEPO-MEDROL) injection 80 mg     Laterality: Right  Location/Site:  L3-L4 L4-L5  Needle size: 22 guage  Needle type: Spinal  Needle Placement: Articular  Findings:  -Comments: Excellent flow of contrast producing a partial arthrogram.  Procedure Details: The fluoroscope beam is vertically oriented in AP, and the inferior recess is visualized beneath the lower pole of the inferior apophyseal process, which represents the target point for needle insertion. When direct visualization is difficult the target point is located at the medial projection of the vertebral pedicle. The region overlying each aforementioned target is locally anesthetized with a 1 to 2 ml. volume of 1% Lidocaine without Epinephrine.   The spinal needle was inserted into each of the above mentioned facet joints using biplanar fluoroscopic guidance. A 0.25 to 0.5 ml. volume of Isovue-250 was injected and a partial facet joint arthrogram was obtained. A single spot film was obtained of the resulting arthrogram.    One to 1.25 ml of the steroid/anesthetic solution was then injected into each of the facet joints noted above.   Additional Comments:  The patient tolerated the procedure well Dressing: 2 x 2 sterile gauze and Band-Aid    Post-procedure details: Patient was observed  during the procedure. Post-procedure instructions were reviewed.  Patient left the clinic in stable condition.

## 2019-11-21 DIAGNOSIS — I10 Essential (primary) hypertension: Secondary | ICD-10-CM | POA: Diagnosis not present

## 2019-11-21 DIAGNOSIS — E785 Hyperlipidemia, unspecified: Secondary | ICD-10-CM | POA: Diagnosis not present

## 2019-11-21 DIAGNOSIS — E039 Hypothyroidism, unspecified: Secondary | ICD-10-CM | POA: Diagnosis not present

## 2019-11-21 DIAGNOSIS — R82998 Other abnormal findings in urine: Secondary | ICD-10-CM | POA: Diagnosis not present

## 2019-11-21 DIAGNOSIS — R7301 Impaired fasting glucose: Secondary | ICD-10-CM | POA: Diagnosis not present

## 2019-11-21 DIAGNOSIS — E559 Vitamin D deficiency, unspecified: Secondary | ICD-10-CM | POA: Diagnosis not present

## 2019-11-24 DIAGNOSIS — D6869 Other thrombophilia: Secondary | ICD-10-CM | POA: Diagnosis not present

## 2019-11-24 DIAGNOSIS — I10 Essential (primary) hypertension: Secondary | ICD-10-CM | POA: Diagnosis not present

## 2019-11-24 DIAGNOSIS — N390 Urinary tract infection, site not specified: Secondary | ICD-10-CM | POA: Diagnosis not present

## 2019-11-24 DIAGNOSIS — Z23 Encounter for immunization: Secondary | ICD-10-CM | POA: Diagnosis not present

## 2019-11-24 DIAGNOSIS — D692 Other nonthrombocytopenic purpura: Secondary | ICD-10-CM | POA: Diagnosis not present

## 2019-11-24 DIAGNOSIS — E785 Hyperlipidemia, unspecified: Secondary | ICD-10-CM | POA: Diagnosis not present

## 2019-11-24 DIAGNOSIS — G459 Transient cerebral ischemic attack, unspecified: Secondary | ICD-10-CM | POA: Diagnosis not present

## 2019-11-24 DIAGNOSIS — I4891 Unspecified atrial fibrillation: Secondary | ICD-10-CM | POA: Diagnosis not present

## 2019-11-24 DIAGNOSIS — M859 Disorder of bone density and structure, unspecified: Secondary | ICD-10-CM | POA: Diagnosis not present

## 2019-11-24 DIAGNOSIS — E039 Hypothyroidism, unspecified: Secondary | ICD-10-CM | POA: Diagnosis not present

## 2019-11-24 DIAGNOSIS — Z0000 Encounter for general adult medical examination without abnormal findings: Secondary | ICD-10-CM | POA: Diagnosis not present

## 2020-01-15 NOTE — Progress Notes (Signed)
Virtual Visit via Telephone Note   This visit type was conducted due to national recommendations for restrictions regarding the COVID-19 Pandemic (e.g. social distancing) in an effort to limit this patient's exposure and mitigate transmission in our community.  Due to her co-morbid illnesses, this patient is at least at moderate risk for complications without adequate follow up.  This format is felt to be most appropriate for this patient at this time.  The patient did not have access to video technology/had technical difficulties with video requiring transitioning to audio format only (telephone).  All issues noted in this document were discussed and addressed.  No physical exam could be performed with this format.  Please refer to the patient's chart for her  consent to telehealth for [facility].    Date:  01/16/2020   ID:  Amanda Cochran, DOB 12/02/1928, MRN [id number] The patient was identified using 2 identifiers.  Patient Location: Home Provider Location: Home Office  PCP:  Crist Infante, MD  Cardiologist:  Mertie Moores, MD   Electrophysiologist:  None   Evaluation Performed:  Follow-Up Visit  Chief Complaint:  Follow-up (AFib)    Patient Profile: Amanda Cochran is a 86 y.o. female with:  Permanent atrial fibrillation   Apixaban Rx   Neurology changed to dabigatran after CVA in 12/2018 >> ? back to Apixaban due to $$$  Heart failure with preserved ejection fraction   Hypertension   Hyperlipidemia   Hx of CVA in 01/2014, 50/0938  Left MCA embolic stroke 18/2993  Hypothyroidism   Glaucoma with severely impaired vision  Prior CV Studies: Echocardiogram 01/13/2019 EF 50-55, mild LVH, inferoseptal/inferior HK, normal RVSF, moderate LAE, mild RAE, mild TR, trivial AI  Head/Neck CTA 01/13/2019 Minimal stenosis proximal right ICA; <50% stenosis left ICA  Carotid US 02/04/2014 Bilateral ICA 1-39  Myoview 01/13/2007 Low risk  Cardiac catheterization  02/27/2000 LAD normal LCx normal RCA irregularities EF 60   History of Present Illness:   Amanda Cochran was last seen by Dr. Acie Fredrickson in January 2021 via telemedicine.   She is seen for follow-up.  She had an episode of chest discomfort back in September.  She felt in her lower chest and it radiated across her right side.  She thinks it was related to gallstones.  She has not had a recurrence.  She tries to remain active.  She walks about 30 minutes every day inside her house.  She has not had exertional chest discomfort, shortness of breath.  She has not had syncope.  Past Medical History:  Diagnosis Date  . Arthritis   . Bladder infection   . CVA (cerebral infarction) 02/07/2014   Left basal ganglia infarct with right hemiparesis   . Glaucoma   . Glaucoma   . Glaucoma (increased eye pressure)   . Hyperlipidemia   . Hypertension   . Left sided lacunar stroke ([place]) 02/14/2014  . Right hemiparesis ([place]) 02/09/2014   Left basal ganglia infarct Dec 2015   . Stroke ([place])   . Thyroid disease    Past Surgical History:  Procedure Laterality Date  . cataract surgery Bilateral 2015  . THYROID SURGERY  1976  . TONSILLECTOMY AND ADENOIDECTOMY    . VAGINAL PROLAPSE REPAIR       Current Meds  Medication Sig  . amLODipine (NORVASC) 5 MG tablet Take 1 tablet by mouth daily.  . Ascorbic Acid (VITAMIN C PO) Take 1 tablet by mouth daily.   Marland Kitchen atorvastatin (LIPITOR) 40 MG tablet Take  1 tablet (40 mg total) by mouth daily at 6 PM.  . benazepril (LOTENSIN) 20 MG tablet Take 20 mg by mouth daily.  . Cholecalciferol (VITAMIN D) 2000 UNITS CAPS Take 1 capsule by mouth daily.  . dorzolamide-timolol (COSOPT) 22.3-6.8 MG/ML ophthalmic solution Place 1 drop into both eyes 2 (two) times daily.  Marland Kitchen ELIQUIS 5 MG TABS tablet Take 5 mg by mouth 2 (two) times daily.  . hydrALAZINE (APRESOLINE) 25 MG tablet Take 1 tablet (25 mg total) by mouth every 8 (eight) hours.  Marland Kitchen latanoprost (XALATAN) 0.005 % ophthalmic  solution Place 1 drop into both eyes at bedtime.   Marland Kitchen levothyroxine (SYNTHROID, LEVOTHROID) 100 MCG tablet Take 1 tablet (100 mcg total) by mouth daily. (Patient taking differently: Take 100 mcg by mouth daily before breakfast. )  . Multiple Vitamins-Minerals (CENTRUM SILVER PO) Take 1 tablet by mouth daily.  . Omega-3 Fatty Acids (FISH OIL) 1000 MG CAPS Take 1,000 mg by mouth daily.   . pilocarpine (PILOCAR) 1 % ophthalmic solution Place 1 drop into both eyes 4 (four) times daily.      Allergies:   Patient has no known allergies.   Social History   Tobacco Use  . Smoking status: Never Smoker  . Smokeless tobacco: Never Used  Vaping Use  . Vaping Use: Never used  Substance Use Topics  . Alcohol use: No  . Drug use: No     Family Hx: The patient's family history includes AAA (abdominal aortic aneurysm) in her father; Heart failure in her mother.  ROS:   Please see the history of present illness.    She notes frequent bruising.  She has not noticed any hematochezia or hematuria.     Labs/Other Tests and Data Reviewed:    EKG:  No ECG reviewed.  Recent Labs: 10/04/2019: TSH 1.370   Recent Lipid Panel Lab Results  Component Value Date/Time   CHOL 211 (H) 01/13/2019 05:31 AM   TRIG 152 (H) 01/13/2019 05:31 AM   HDL 51 01/13/2019 05:31 AM   CHOLHDL 4.1 01/13/2019 05:31 AM   LDLCALC 130 (H) 01/13/2019 05:31 AM    Wt Readings from Last 3 Encounters:  01/16/20 155 lb (70.3 kg)  10/05/19 157 lb (71.2 kg)  10/04/19 157 lb (71.2 kg)     Risk Assessment/Calculations:     CHA2DS2-VASc Score = 7  This indicates a 11.2% annual risk of stroke. The patient's score is based upon: CHF History: 1 HTN History: 1 Diabetes History: 0 Stroke History: 2 Vascular Disease History: 0 Age Score: 2 Gender Score: 1     Objective:    Vital Signs:  Ht 5\' 5"  (1.651 m)   Wt 155 lb (70.3 kg)   BMI 25.79 kg/m    VITAL SIGNS:  reviewed GEN:  no acute distress PSYCH:  normal  affect  ASSESSMENT & PLAN:    1. Precordial pain Etiology of her pain is not entirely clear.  Due to the limitations of telemedicine, I am unable to get an EKG today or do a physical exam.  She had a singular episode of chest discomfort and has not had a recurrence.  We discussed the possibility of proceeding with further testing with an echocardiogram to rule out LV dysfunction and new wall motion abnormalities versus watchful waiting.  She prefers not to pursue any testing right now.  Therefore, I will arrange a follow-up in the office with Dr. Acie Fredrickson in the next 3 months.  She knows to contact us  if she has recurrent symptoms.  2. Permanent atrial fibrillation (HCC) Overall, she seems to be tolerating anticoagulation.  She does note bruising but no other bleeding issues.  Her weight is above 60 kg and her creatinine has remained <1.5.  Therefore, she should remain on apixaban 5 mg twice daily.  3. Chronic heart failure with preserved ejection fraction ([place]) She is not on diuretic therapy and her volume status seems to be stable.  4. Essential hypertension She is unable to obtain a blood pressure for me today.  She is pretty sure that her blood pressure was optimal when she saw her PCP recently.  5. Mixed hyperlipidemia She remains on high intensity statin therapy.  She had labs recently obtained with primary care.  We will request those for her records.     Time:   Today, I have spent 32 minutes with the patient with telehealth technology discussing the above problems.     Medication Adjustments/Labs and Tests Ordered: Current medicines are reviewed at length with the patient today.  Concerns regarding medicines are outlined above.   Tests Ordered: No orders of the defined types were placed in this encounter.   Medication Changes: No orders of the defined types were placed in this encounter.   Follow Up:  In Person in 3 month(s)  Signed, Richardson Dopp, PA-C  01/16/2020 4:47  PM    [place]

## 2020-01-16 ENCOUNTER — Telehealth (INDEPENDENT_AMBULATORY_CARE_PROVIDER_SITE_OTHER): Payer: Medicare Other | Admitting: Physician Assistant

## 2020-01-16 ENCOUNTER — Encounter: Payer: Self-pay | Admitting: Physician Assistant

## 2020-01-16 ENCOUNTER — Other Ambulatory Visit: Payer: Self-pay

## 2020-01-16 VITALS — Ht 65.0 in | Wt 155.0 lb

## 2020-01-16 DIAGNOSIS — I4821 Permanent atrial fibrillation: Secondary | ICD-10-CM | POA: Diagnosis not present

## 2020-01-16 DIAGNOSIS — I10 Essential (primary) hypertension: Secondary | ICD-10-CM | POA: Diagnosis not present

## 2020-01-16 DIAGNOSIS — I5032 Chronic diastolic (congestive) heart failure: Secondary | ICD-10-CM | POA: Diagnosis not present

## 2020-01-16 DIAGNOSIS — E782 Mixed hyperlipidemia: Secondary | ICD-10-CM | POA: Diagnosis not present

## 2020-01-16 DIAGNOSIS — I639 Cerebral infarction, unspecified: Secondary | ICD-10-CM

## 2020-01-16 DIAGNOSIS — R072 Precordial pain: Secondary | ICD-10-CM

## 2020-01-16 NOTE — Patient Instructions (Addendum)
Medication Instructions:  Your physician recommends that you continue on your current medications as directed. Please refer to the Current Medication list given to you today.  *If you need a refill on your cardiac medications before your next appointment, please call your pharmacy*  Lab Work: None ordered today  Testing/Procedures: None ordered today  Follow-Up: At [facility], you and your health needs are our priority.  As part of our continuing mission to provide you with exceptional heart care, we have created designated Provider Care Teams.  These Care Teams include your primary Cardiologist (physician) and Advanced Practice Providers (APPs -  Physician Assistants and Nurse Practitioners) who all work together to provide you with the care you need, when you need it.  Your next appointment:   3 month(s)  The format for your next appointment:   In Person  Provider:   Mertie Moores, MD

## 2020-02-14 DIAGNOSIS — H401133 Primary open-angle glaucoma, bilateral, severe stage: Secondary | ICD-10-CM | POA: Diagnosis not present

## 2020-04-15 ENCOUNTER — Ambulatory Visit: Payer: Medicare Other | Admitting: Neurology

## 2020-05-16 DIAGNOSIS — H401133 Primary open-angle glaucoma, bilateral, severe stage: Secondary | ICD-10-CM | POA: Diagnosis not present

## 2020-06-27 DIAGNOSIS — I10 Essential (primary) hypertension: Secondary | ICD-10-CM | POA: Diagnosis not present

## 2020-06-27 DIAGNOSIS — I6389 Other cerebral infarction: Secondary | ICD-10-CM | POA: Diagnosis not present

## 2020-06-27 DIAGNOSIS — E039 Hypothyroidism, unspecified: Secondary | ICD-10-CM | POA: Diagnosis not present

## 2020-06-27 DIAGNOSIS — E785 Hyperlipidemia, unspecified: Secondary | ICD-10-CM | POA: Diagnosis not present

## 2020-06-27 DIAGNOSIS — F329 Major depressive disorder, single episode, unspecified: Secondary | ICD-10-CM | POA: Diagnosis not present

## 2020-06-27 DIAGNOSIS — R7301 Impaired fasting glucose: Secondary | ICD-10-CM | POA: Diagnosis not present

## 2020-06-27 DIAGNOSIS — M1990 Unspecified osteoarthritis, unspecified site: Secondary | ICD-10-CM | POA: Diagnosis not present

## 2020-06-27 DIAGNOSIS — I69959 Hemiplegia and hemiparesis following unspecified cerebrovascular disease affecting unspecified side: Secondary | ICD-10-CM | POA: Diagnosis not present

## 2020-06-27 DIAGNOSIS — D6869 Other thrombophilia: Secondary | ICD-10-CM | POA: Diagnosis not present

## 2020-06-27 DIAGNOSIS — I4891 Unspecified atrial fibrillation: Secondary | ICD-10-CM | POA: Diagnosis not present

## 2020-06-27 DIAGNOSIS — D692 Other nonthrombocytopenic purpura: Secondary | ICD-10-CM | POA: Diagnosis not present

## 2020-06-27 DIAGNOSIS — G459 Transient cerebral ischemic attack, unspecified: Secondary | ICD-10-CM | POA: Diagnosis not present

## 2020-09-09 DIAGNOSIS — H401133 Primary open-angle glaucoma, bilateral, severe stage: Secondary | ICD-10-CM | POA: Diagnosis not present

## 2020-11-14 DIAGNOSIS — H401133 Primary open-angle glaucoma, bilateral, severe stage: Secondary | ICD-10-CM | POA: Diagnosis not present

## 2020-11-14 DIAGNOSIS — H548 Legal blindness, as defined in USA: Secondary | ICD-10-CM | POA: Diagnosis not present

## 2020-11-14 DIAGNOSIS — H04302 Unspecified dacryocystitis of left lacrimal passage: Secondary | ICD-10-CM | POA: Diagnosis not present

## 2021-01-06 DIAGNOSIS — H04302 Unspecified dacryocystitis of left lacrimal passage: Secondary | ICD-10-CM | POA: Diagnosis not present

## 2021-01-06 DIAGNOSIS — H548 Legal blindness, as defined in USA: Secondary | ICD-10-CM | POA: Diagnosis not present

## 2021-01-06 DIAGNOSIS — H401133 Primary open-angle glaucoma, bilateral, severe stage: Secondary | ICD-10-CM | POA: Diagnosis not present

## 2021-01-09 DIAGNOSIS — E559 Vitamin D deficiency, unspecified: Secondary | ICD-10-CM | POA: Diagnosis not present

## 2021-01-09 DIAGNOSIS — I493 Ventricular premature depolarization: Secondary | ICD-10-CM | POA: Diagnosis not present

## 2021-01-09 DIAGNOSIS — Z23 Encounter for immunization: Secondary | ICD-10-CM | POA: Diagnosis not present

## 2021-01-09 DIAGNOSIS — D6869 Other thrombophilia: Secondary | ICD-10-CM | POA: Diagnosis not present

## 2021-01-09 DIAGNOSIS — D692 Other nonthrombocytopenic purpura: Secondary | ICD-10-CM | POA: Diagnosis not present

## 2021-01-09 DIAGNOSIS — Z1339 Encounter for screening examination for other mental health and behavioral disorders: Secondary | ICD-10-CM | POA: Diagnosis not present

## 2021-01-09 DIAGNOSIS — L570 Actinic keratosis: Secondary | ICD-10-CM | POA: Diagnosis not present

## 2021-01-09 DIAGNOSIS — Z1331 Encounter for screening for depression: Secondary | ICD-10-CM | POA: Diagnosis not present

## 2021-01-09 DIAGNOSIS — I4891 Unspecified atrial fibrillation: Secondary | ICD-10-CM | POA: Diagnosis not present

## 2021-01-09 DIAGNOSIS — E039 Hypothyroidism, unspecified: Secondary | ICD-10-CM | POA: Diagnosis not present

## 2021-01-09 DIAGNOSIS — G459 Transient cerebral ischemic attack, unspecified: Secondary | ICD-10-CM | POA: Diagnosis not present

## 2021-01-09 DIAGNOSIS — Z85828 Personal history of other malignant neoplasm of skin: Secondary | ICD-10-CM | POA: Diagnosis not present

## 2021-01-09 DIAGNOSIS — M1990 Unspecified osteoarthritis, unspecified site: Secondary | ICD-10-CM | POA: Diagnosis not present

## 2021-01-09 DIAGNOSIS — I10 Essential (primary) hypertension: Secondary | ICD-10-CM | POA: Diagnosis not present

## 2021-01-09 DIAGNOSIS — Z0000 Encounter for general adult medical examination without abnormal findings: Secondary | ICD-10-CM | POA: Diagnosis not present

## 2021-01-09 DIAGNOSIS — R82998 Other abnormal findings in urine: Secondary | ICD-10-CM | POA: Diagnosis not present

## 2021-01-09 DIAGNOSIS — R7301 Impaired fasting glucose: Secondary | ICD-10-CM | POA: Diagnosis not present

## 2021-01-09 DIAGNOSIS — E785 Hyperlipidemia, unspecified: Secondary | ICD-10-CM | POA: Diagnosis not present

## 2021-03-18 DIAGNOSIS — Z79899 Other long term (current) drug therapy: Secondary | ICD-10-CM | POA: Diagnosis not present

## 2021-03-18 DIAGNOSIS — H04302 Unspecified dacryocystitis of left lacrimal passage: Secondary | ICD-10-CM | POA: Diagnosis not present

## 2021-03-20 DIAGNOSIS — H548 Legal blindness, as defined in USA: Secondary | ICD-10-CM | POA: Diagnosis not present

## 2021-03-20 DIAGNOSIS — H401133 Primary open-angle glaucoma, bilateral, severe stage: Secondary | ICD-10-CM | POA: Diagnosis not present

## 2021-03-23 DIAGNOSIS — E785 Hyperlipidemia, unspecified: Secondary | ICD-10-CM | POA: Diagnosis not present

## 2021-03-23 DIAGNOSIS — I10 Essential (primary) hypertension: Secondary | ICD-10-CM | POA: Diagnosis not present

## 2021-03-23 DIAGNOSIS — M1990 Unspecified osteoarthritis, unspecified site: Secondary | ICD-10-CM | POA: Diagnosis not present

## 2021-03-23 DIAGNOSIS — E039 Hypothyroidism, unspecified: Secondary | ICD-10-CM | POA: Diagnosis not present

## 2021-03-27 DIAGNOSIS — Z79899 Other long term (current) drug therapy: Secondary | ICD-10-CM | POA: Diagnosis not present

## 2021-03-27 DIAGNOSIS — H04302 Unspecified dacryocystitis of left lacrimal passage: Secondary | ICD-10-CM | POA: Diagnosis not present

## 2021-04-04 DIAGNOSIS — H04302 Unspecified dacryocystitis of left lacrimal passage: Secondary | ICD-10-CM | POA: Diagnosis not present

## 2021-04-21 ENCOUNTER — Other Ambulatory Visit: Payer: Self-pay

## 2021-04-21 ENCOUNTER — Ambulatory Visit (INDEPENDENT_AMBULATORY_CARE_PROVIDER_SITE_OTHER): Payer: Medicare Other | Admitting: Ophthalmology

## 2021-04-21 ENCOUNTER — Encounter (INDEPENDENT_AMBULATORY_CARE_PROVIDER_SITE_OTHER): Payer: Self-pay | Admitting: Ophthalmology

## 2021-04-21 DIAGNOSIS — H353132 Nonexudative age-related macular degeneration, bilateral, intermediate dry stage: Secondary | ICD-10-CM | POA: Diagnosis not present

## 2021-04-21 DIAGNOSIS — H401133 Primary open-angle glaucoma, bilateral, severe stage: Secondary | ICD-10-CM

## 2021-04-21 NOTE — Assessment & Plan Note (Addendum)
No signs of active macular disease OU today

## 2021-04-21 NOTE — Progress Notes (Signed)
04/21/2021     CHIEF COMPLAINT Patient presents for No chief complaint on file.     HISTORY OF PRESENT ILLNESS: Amanda Cochran is a 88 y.o. female who presents to the clinic today for:   HPI   2 yr fu ou FP. Pt states "I see Dr. Edilia Bo at [facility] and he wants me to have a surgery for my vision. I want to see Dr. Zadie Rhine to see about my eyes to see if I should have the surgery but my surgery is with a different doctor." Pt states she has a procedure on Thursday for her tear ducts because she keeps getting infections from blocked tear ducts. Pt uses dorz- tim OU every 12 hours, latanoprost OU QHS, moxifloxacin OS for 30 days pt states she is supposed to stop that drop, pilocarpine QID OU per her medication list from 03/20/2021 [facility] list.  Last edited by Laurin Coder on 04/21/2021  2:19 PM.      Referring physician: Rondel Oh, [place],   [id number]  HISTORICAL INFORMATION:   Selected notes from the MEDICAL RECORD NUMBER    Lab Results  Component Value Date   HGBA1C 5.9 (H) 01/13/2019     CURRENT MEDICATIONS: Current Outpatient Medications (Ophthalmic Drugs)  Medication Sig   dorzolamide-timolol (COSOPT) 22.3-6.8 MG/ML ophthalmic solution Place 1 drop into both eyes 2 (two) times daily.   latanoprost (XALATAN) 0.005 % ophthalmic solution Place 1 drop into both eyes at bedtime.    pilocarpine (PILOCAR) 1 % ophthalmic solution Place 1 drop into both eyes 4 (four) times daily.    No current facility-administered medications for this visit. (Ophthalmic Drugs)   Current Outpatient Medications (Other)  Medication Sig   amLODipine (NORVASC) 5 MG tablet Take 1 tablet by mouth daily.   Ascorbic Acid (VITAMIN C PO) Take 1 tablet by mouth daily.    atorvastatin (LIPITOR) 40 MG tablet Take 1 tablet (40 mg total) by mouth daily at 6 PM.   benazepril (LOTENSIN) 20 MG tablet Take 20 mg by mouth daily.   Cholecalciferol (VITAMIN D) 2000 UNITS CAPS  Take 1 capsule by mouth daily.   ELIQUIS 5 MG TABS tablet Take 5 mg by mouth 2 (two) times daily.   hydrALAZINE (APRESOLINE) 25 MG tablet Take 1 tablet (25 mg total) by mouth every 8 (eight) hours.   levothyroxine (SYNTHROID, LEVOTHROID) 100 MCG tablet Take 1 tablet (100 mcg total) by mouth daily. (Patient taking differently: Take 100 mcg by mouth daily before breakfast. )   Multiple Vitamins-Minerals (CENTRUM SILVER PO) Take 1 tablet by mouth daily.   Omega-3 Fatty Acids (FISH OIL) 1000 MG CAPS Take 1,000 mg by mouth daily.    No current facility-administered medications for this visit. (Other)      REVIEW OF SYSTEMS: ROS   Negative for: Constitutional, Gastrointestinal, Neurological, Skin, Genitourinary, Musculoskeletal, HENT, Endocrine, Cardiovascular, Eyes, Respiratory, Psychiatric, Allergic/Imm, Heme/Lymph Last edited by Hurman Horn, MD on 04/21/2021  2:47 PM.       ALLERGIES No Known Allergies  PAST MEDICAL HISTORY Past Medical History:  Diagnosis Date   Arthritis    Bladder infection    CVA (cerebral infarction) 02/07/2014   Left basal ganglia infarct with right hemiparesis    Glaucoma    Glaucoma    Glaucoma (increased eye pressure)    Hyperlipidemia    Hypertension    Left sided lacunar stroke ([place]) 02/14/2014   Right hemiparesis ([place]) 02/09/2014  Left basal ganglia infarct Dec 2015    Stroke [facility])    Thyroid disease    Past Surgical History:  Procedure Laterality Date   cataract surgery Bilateral 2015   THYROID SURGERY  1976   TONSILLECTOMY AND ADENOIDECTOMY     VAGINAL PROLAPSE REPAIR      FAMILY HISTORY Family History  Problem Relation Age of Onset   Heart failure Mother    AAA (abdominal aortic aneurysm) Father     SOCIAL HISTORY Social History   Tobacco Use   Smoking status: Never   Smokeless tobacco: Never  Vaping Use   Vaping Use: Never used  Substance Use Topics   Alcohol use: No   Drug use: No         OPHTHALMIC  EXAM:  Base Eye Exam     Visual Acuity (ETDRS)       Right Left   Dist  HM HM         Tonometry (Tonopen, 2:19 PM)       Right Left   Pressure 17 14         Pupils       Pupils Dark Light Shape React APD   Right PERRL 3 3 Round Minimal None   Left PERRL 3 3 Round Minimal None         Visual Fields (Counting fingers)       Left Right   Restrictions Total superior temporal, inferior temporal, superior nasal, inferior nasal deficiencies Total superior temporal, inferior temporal, superior nasal, inferior nasal deficiencies         Extraocular Movement       Right Left    Full Full         Neuro/Psych     Oriented x3: Yes   Mood/Affect: Normal         Dilation     Both eyes: 1.0% Mydriacyl, 2.5% Phenylephrine @ 2:19 PM           Slit Lamp and Fundus Exam     External Exam       Right Left   External Normal Normal         Slit Lamp Exam       Right Left   Lids/Lashes Normal Normal   Conjunctiva/Sclera White and quiet White and quiet   Cornea Clear Clear   Anterior Chamber Deep and quiet Deep and quiet   Iris Round and reactive Round and reactive   Lens Centered posterior chamber intraocular lens Centered posterior chamber intraocular lens   Anterior Vitreous Normal Normal         Fundus Exam       Right Left   Posterior Vitreous Normal, Normal   Disc Normal Normal   C/D Ratio 1.0 0.95   Macula Hard drusen, no hemorrhage, no macular thickening Hard drusen, no hemorrhage, no macular thickening   Vessels Normal Normal   Periphery Normal Normal            IMAGING AND PROCEDURES  Imaging and Procedures for 04/21/21  Color Fundus Photography Optos - OU - Both Eyes       Right Eye Progression has been stable. Disc findings include pallor, increased cup to disc ratio. Macula : drusen. Vessels : normal observations. Periphery : normal observations.   Left Eye Progression has been stable. Disc findings include  increased cup to disc ratio, pallor. Macula : drusen. Vessels : normal observations. Periphery : normal observations.  ASSESSMENT/PLAN:  Primary open angle glaucoma of both eyes, severe stage Follow-up with Dr. Dionne Ano as scheduled and the recommendations for this condition as per Dr. Kristen Cardinal  Intermediate stage nonexudative age-related macular degeneration of both eyes No signs of active macular disease OU today     ICD-10-CM   1. Intermediate stage nonexudative age-related macular degeneration of both eyes  H35.3132 Color Fundus Photography Optos - OU - Both Eyes    2. Primary open angle glaucoma of both eyes, severe stage  H40.1133       1.  OU with mild to moderate intermediate AMD.  No findings of CNVM OU  2.  Severe glaucomatous damage OU, follow-up with [facility], glaucoma service as scheduled  3.  Ophthalmic Meds Ordered this visit:  No orders of the defined types were placed in this encounter.      Return in about 2 years (around 04/22/2023) for DILATE OU, COLOR FP, OCT.  There are no Patient Instructions on file for this visit.   Explained the diagnoses, plan, and follow up with the patient and they expressed understanding.  Patient expressed understanding of the importance of proper follow up care.   Clent Demark  M.D. Diseases & Surgery of the Retina and Vitreous Retina & Diabetic [place] 04/21/21     Abbreviations: M myopia (nearsighted); A astigmatism; H hyperopia (farsighted); P presbyopia; Mrx spectacle prescription;  CTL contact lenses; OD right eye; OS left eye; OU both eyes  XT exotropia; ET esotropia; PEK punctate epithelial keratitis; PEE punctate epithelial erosions; DES dry eye syndrome; MGD meibomian gland dysfunction; ATs artificial tears; PFAT's preservative free artificial tears; [place] nuclear sclerotic cataract; PSC posterior subcapsular cataract; ERM epi-retinal membrane; PVD posterior vitreous detachment; RD  retinal detachment; DM diabetes mellitus; DR diabetic retinopathy; NPDR non-proliferative diabetic retinopathy; PDR proliferative diabetic retinopathy; CSME clinically significant macular edema; DME diabetic macular edema; dbh dot blot hemorrhages; CWS cotton wool spot; POAG primary open angle glaucoma; C/D cup-to-disc ratio; HVF humphrey visual field; GVF goldmann visual field; OCT optical coherence tomography; IOP intraocular pressure; BRVO Branch retinal vein occlusion; CRVO central retinal vein occlusion; CRAO central retinal artery occlusion; BRAO branch retinal artery occlusion; RT retinal tear; SB scleral buckle; PPV pars plana vitrectomy; VH Vitreous hemorrhage; PRP panretinal laser photocoagulation; IVK intravitreal kenalog; VMT vitreomacular traction; MH Macular hole;  NVD neovascularization of the disc; NVE neovascularization elsewhere; AREDS age related eye disease study; ARMD age related macular degeneration; POAG primary open angle glaucoma; EBMD epithelial/anterior basement membrane dystrophy; ACIOL anterior chamber intraocular lens; IOL intraocular lens; PCIOL posterior chamber intraocular lens; Phaco/IOL phacoemulsification with intraocular lens placement; [place] photorefractive keratectomy; LASIK laser assisted in situ keratomileusis; HTN hypertension; DM diabetes mellitus; COPD chronic obstructive pulmonary disease

## 2021-04-21 NOTE — Assessment & Plan Note (Signed)
Follow-up with Dr. Dionne Ano as scheduled and the recommendations for this condition as per Dr. Kristen Cardinal

## 2021-04-24 DIAGNOSIS — H04302 Unspecified dacryocystitis of left lacrimal passage: Secondary | ICD-10-CM | POA: Diagnosis not present

## 2021-04-24 DIAGNOSIS — Z961 Presence of intraocular lens: Secondary | ICD-10-CM | POA: Diagnosis not present

## 2021-04-24 DIAGNOSIS — H0289 Other specified disorders of eyelid: Secondary | ICD-10-CM | POA: Diagnosis not present

## 2021-04-24 DIAGNOSIS — H02403 Unspecified ptosis of bilateral eyelids: Secondary | ICD-10-CM | POA: Diagnosis not present

## 2021-06-19 DIAGNOSIS — H04302 Unspecified dacryocystitis of left lacrimal passage: Secondary | ICD-10-CM | POA: Diagnosis not present

## 2021-06-19 DIAGNOSIS — H548 Legal blindness, as defined in USA: Secondary | ICD-10-CM | POA: Diagnosis not present

## 2021-06-19 DIAGNOSIS — H401133 Primary open-angle glaucoma, bilateral, severe stage: Secondary | ICD-10-CM | POA: Diagnosis not present

## 2021-07-08 DIAGNOSIS — E039 Hypothyroidism, unspecified: Secondary | ICD-10-CM | POA: Diagnosis not present

## 2021-07-08 DIAGNOSIS — I4891 Unspecified atrial fibrillation: Secondary | ICD-10-CM | POA: Diagnosis not present

## 2021-07-08 DIAGNOSIS — D692 Other nonthrombocytopenic purpura: Secondary | ICD-10-CM | POA: Diagnosis not present

## 2021-07-08 DIAGNOSIS — E785 Hyperlipidemia, unspecified: Secondary | ICD-10-CM | POA: Diagnosis not present

## 2021-07-08 DIAGNOSIS — R7301 Impaired fasting glucose: Secondary | ICD-10-CM | POA: Diagnosis not present

## 2021-07-08 DIAGNOSIS — M1990 Unspecified osteoarthritis, unspecified site: Secondary | ICD-10-CM | POA: Diagnosis not present

## 2021-07-08 DIAGNOSIS — I10 Essential (primary) hypertension: Secondary | ICD-10-CM | POA: Diagnosis not present

## 2021-07-08 DIAGNOSIS — D6869 Other thrombophilia: Secondary | ICD-10-CM | POA: Diagnosis not present

## 2021-07-08 DIAGNOSIS — G459 Transient cerebral ischemic attack, unspecified: Secondary | ICD-10-CM | POA: Diagnosis not present

## 2021-08-04 DIAGNOSIS — H04302 Unspecified dacryocystitis of left lacrimal passage: Secondary | ICD-10-CM | POA: Diagnosis not present

## 2021-08-04 DIAGNOSIS — H548 Legal blindness, as defined in USA: Secondary | ICD-10-CM | POA: Diagnosis not present

## 2021-08-04 DIAGNOSIS — H401133 Primary open-angle glaucoma, bilateral, severe stage: Secondary | ICD-10-CM | POA: Diagnosis not present

## 2021-10-02 IMAGING — CT CT ANGIO HEAD
2 of 7 series · 8 of 33 positions shown · IV contrast (APPLIED)
Comparison: MRA head 2302

CLINICAL DATA: Stroke, follow-up

EXAM:
CT ANGIOGRAPHY HEAD AND NECK
TECHNIQUE: Multidetector CT imaging of the head and neck was performed using
the standard protocol during bolus administration of intravenous
contrast. Multiplanar CT image reconstructions and MIPs were
obtained to evaluate the vascular anatomy. Carotid stenosis
measurements (when applicable) are obtained utilizing NASCET
criteria, using the distal internal carotid diameter as the
denominator.
CONTRAST:  75mL OMNIPAQUE IOHEXOL 350 MG/ML SOLN

[Series 5: cta neck/head · axial · 0.48mm/px · z∈[-249,-133]mm · 2 of 175 slices shown]
[im 59/175  soft-tissue]
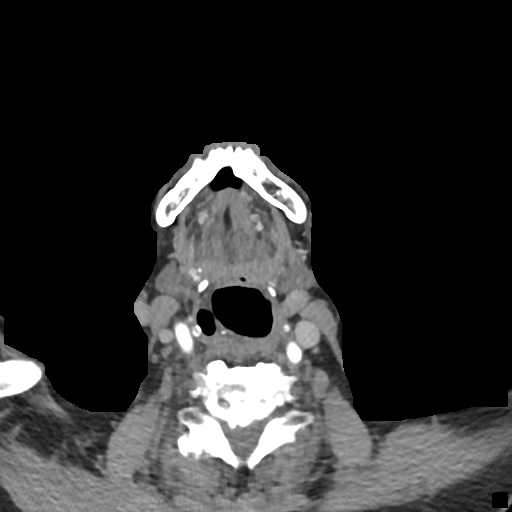
[im 117/175  bone]
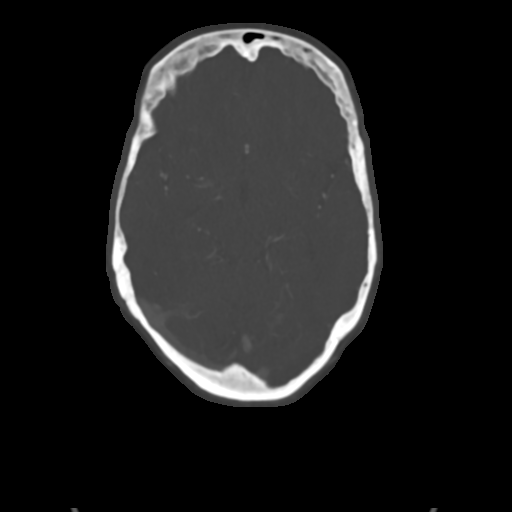

[Series 7: ax thins · axial · 0.39mm/px · z∈[-359,-141]mm · 6 of 350 slices shown]
[im 50/350  soft-tissue]
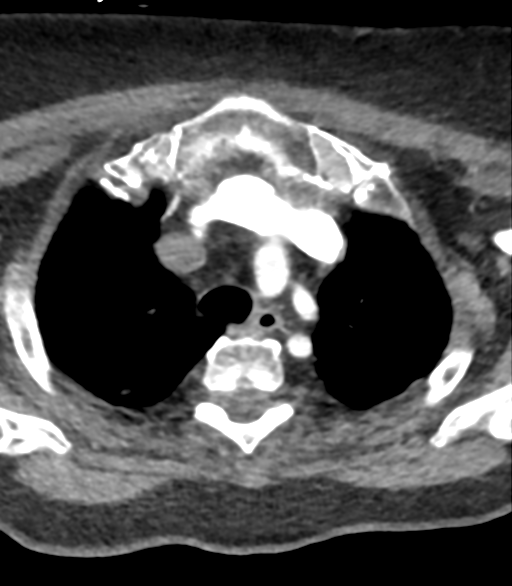
[im 100/350  soft-tissue]
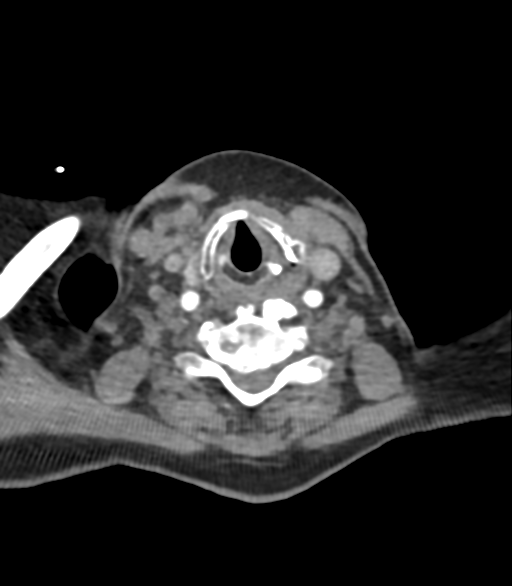
[im 150/350  soft-tissue]
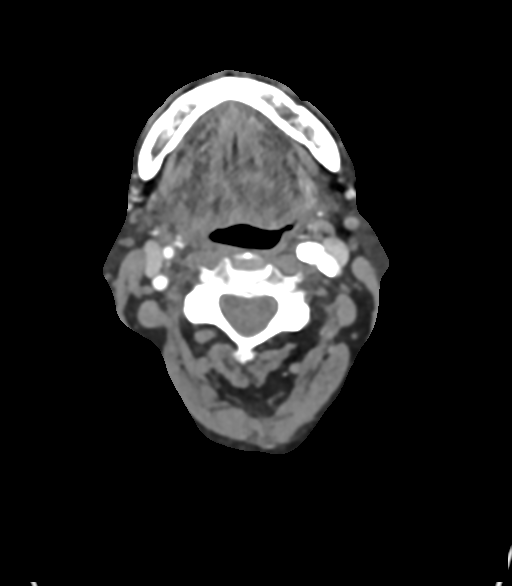
[im 200/350  soft-tissue]
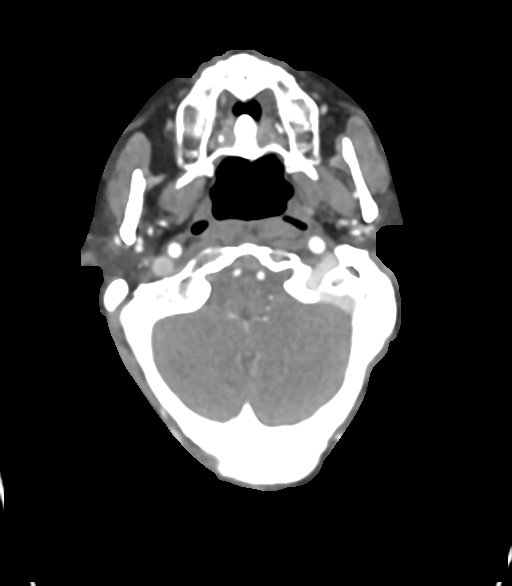
[im 250/350  soft-tissue]
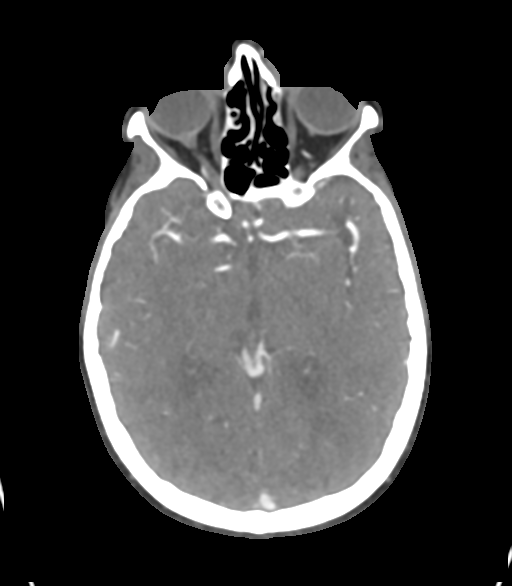
[im 300/350  soft-tissue]
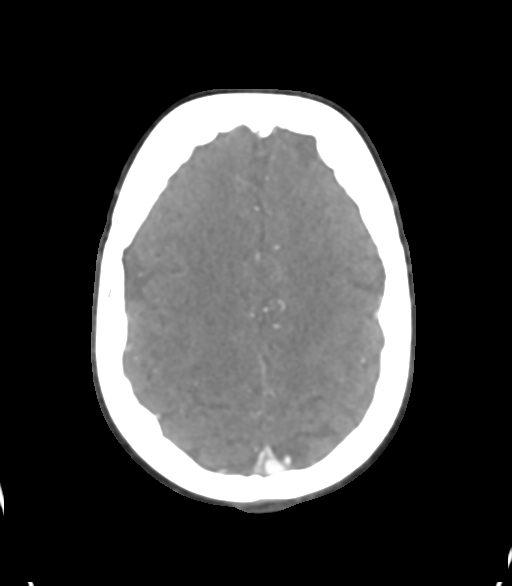

[8 of 33 positions shown; findings below may reference images not displayed]

FINDINGS: CTA NECK FINDINGS

Aortic arch: Mild calcified plaque along the aortic arch. Great
vessel origins are patent.

Right carotid system: Common, internal, and external carotid
arteries are patent. There is calcified plaque at the bifurcation.
Minimal stenosis of the proximal right ICA.

Left carotid system: Common, internal, and external carotid arteries
are patent. Mild calcified plaque along common carotid. Calcified
plaque at the bifurcation and along the proximal internal carotid
causes less than 50% stenosis.

Vertebral arteries: Extracranial vertebral arteries are patent. Left
vertebral artery is dominant. No significant stenosis or evidence of
dissection.

Skeleton: Multilevel degenerative changes.

Other neck: No neck mass or adenopathy.

Upper chest: No apical lung mass.

Review of the MIP images confirms the above findings

CTA HEAD FINDINGS

Anterior circulation: Intracranial internal carotid arteries are
patent. Calcified plaque along cavernous and paraclinoid portions
causes mild stenosis. Anterior and middle cerebral arteries are
patent.

Posterior circulation: Intracranial vertebral arteries are patent
with minimal calcified plaque. Basilar artery is patent. Posterior
cerebral arteries are patent.

Venous sinuses: Patent as permitted by contrast timing.

Anatomic variants: Fetal origin of the left PCA.

Review of the MIP images confirms the above findings
IMPRESSION: No large vessel occlusion. No hemodynamically significant stenosis
in the neck.

## 2021-10-06 ENCOUNTER — Encounter (INDEPENDENT_AMBULATORY_CARE_PROVIDER_SITE_OTHER): Payer: Medicare Other | Admitting: Ophthalmology

## 2021-11-13 ENCOUNTER — Ambulatory Visit: Payer: Medicare Other | Admitting: Physician Assistant

## 2021-11-14 ENCOUNTER — Ambulatory Visit: Payer: Medicare Other | Admitting: Physician Assistant

## 2021-11-27 ENCOUNTER — Encounter: Payer: Self-pay | Admitting: Physician Assistant

## 2021-11-27 ENCOUNTER — Ambulatory Visit: Payer: Self-pay

## 2021-11-27 ENCOUNTER — Ambulatory Visit (INDEPENDENT_AMBULATORY_CARE_PROVIDER_SITE_OTHER): Payer: Medicare Other

## 2021-11-27 ENCOUNTER — Ambulatory Visit (INDEPENDENT_AMBULATORY_CARE_PROVIDER_SITE_OTHER): Payer: Medicare Other | Admitting: Physician Assistant

## 2021-11-27 DIAGNOSIS — M25562 Pain in left knee: Secondary | ICD-10-CM | POA: Diagnosis not present

## 2021-11-27 DIAGNOSIS — M25561 Pain in right knee: Secondary | ICD-10-CM

## 2021-11-27 DIAGNOSIS — G8929 Other chronic pain: Secondary | ICD-10-CM | POA: Diagnosis not present

## 2021-11-27 NOTE — Progress Notes (Signed)
Office Visit Note   Patient: Amanda Cochran           Date of Birth: 08/06/1928           MRN: [id number] Visit Date: 11/27/2021              Requested by: Crist Infante, MD [address] [place],  [place] [address] PCP: Crist Infante, MD   Assessment & Plan: Visit Diagnoses:  1. Chronic pain of left knee   2. Chronic pain of right knee   3. Pain in both knees, unspecified chronicity     Plan: [name] is a longtime patient of Dr. Rudene Anda.  She has had back issues in the past and got injections.  Today she presents with bilateral knee pain.  She denies any injury she has never had injections or x-rays recently.  She admits that today is better.  She talks about start up stiffness.  No instability her exam is fairly benign today x-rays do show some arthritis.  We talked about the natural history of this she is not very asymptomatic today she does take 1 Tylenol in the evening.  I spoke with her and her daughter about taking this twice a day while she is symptomatic.  Also given them information about Voltaren gel which she could put on her knees twice a day.  We did talk briefly about injections but she is not really that tender today but certainly she could follow-up at anytime with Dr. Durward Fortes to get these  Follow-Up Instructions: No follow-ups on file.   Orders:  Orders Placed This Encounter  Procedures   XR Knee 1-2 Views Right   XR Knee 1-2 Views Left   No orders of the defined types were placed in this encounter.     Procedures: No procedures performed   Clinical Data: No additional findings.   Subjective: Chief Complaint  Patient presents with   Right Knee - Pain   Left Knee - Pain    HPI pleasant 88 year old woman presents with bilateral knee pain no known injury takes Tylenol at night  Review of Systems  All other systems reviewed and are negative.    Objective: Vital Signs: There were no vitals taken for this visit.  Physical Exam Constitutional:       Appearance: Normal appearance.  Pulmonary:     Effort: Pulmonary effort is normal.  Skin:    General: Skin is warm and dry.  Neurological:     Mental Status: She is alert.     Ortho Exam Bilateral knees no warmth no swelling no effusions.  She does have grinding bilaterally with extension and flexion.  Little tenderness over the medial joint lines bilaterally good varus and valgus stability distal sensation is intact Specialty Comments:  No specialty comments available.  Imaging: No results found.   PMFS History: Patient Active Problem List   Diagnosis Date Noted   Bilateral knee pain 11/27/2021   Pseudohallucination 10/05/2019   Intermediate stage nonexudative age-related macular degeneration of both eyes 10/05/2019   Optic disc atrophy 10/05/2019   Primary open angle glaucoma of both eyes, severe stage 10/05/2019   Low back pain 10/05/2019   MCI (mild cognitive impairment) 10/04/2019   Acute CVA (cerebrovascular accident) ([place]) 01/13/2019   Atrial fibrillation ([place]) 11/03/2018   Left sided lacunar stroke ([place]) 02/14/2014   Right hemiparesis ([place]) 02/09/2014   Cerebral infarction ([place]) 02/07/2014   Hyperlipidemia    Essential hypertension 02/03/2014   Past Medical History:  Diagnosis Date   Arthritis    Bladder infection    CVA (cerebral infarction) 02/07/2014   Left basal ganglia infarct with right hemiparesis    Glaucoma    Glaucoma    Glaucoma (increased eye pressure)    Hyperlipidemia    Hypertension    Left sided lacunar stroke ([place]) 02/14/2014   Right hemiparesis ([place]) 02/09/2014   Left basal ganglia infarct Dec 2015    Stroke [facility])    Thyroid disease     Family History  Problem Relation Age of Onset   Heart failure Mother    AAA (abdominal aortic aneurysm) Father     Past Surgical History:  Procedure Laterality Date   cataract surgery Bilateral 2015   THYROID SURGERY  1976   TONSILLECTOMY AND ADENOIDECTOMY     VAGINAL PROLAPSE REPAIR      Social History   Occupational History   Occupation: retired    Comment: Glass blower/designer  Tobacco Use   Smoking status: Never   Smokeless tobacco: Never  Vaping Use   Vaping Use: Never used  Substance and Sexual Activity   Alcohol use: No   Drug use: No   Sexual activity: Not on file

## 2021-12-24 ENCOUNTER — Ambulatory Visit: Payer: Medicare Other | Admitting: Orthopaedic Surgery

## 2022-01-06 ENCOUNTER — Ambulatory Visit (INDEPENDENT_AMBULATORY_CARE_PROVIDER_SITE_OTHER): Payer: Medicare Other | Admitting: Orthopaedic Surgery

## 2022-01-06 ENCOUNTER — Encounter: Payer: Self-pay | Admitting: Orthopaedic Surgery

## 2022-01-06 ENCOUNTER — Ambulatory Visit (INDEPENDENT_AMBULATORY_CARE_PROVIDER_SITE_OTHER): Payer: Medicare Other

## 2022-01-06 DIAGNOSIS — G8929 Other chronic pain: Secondary | ICD-10-CM

## 2022-01-06 DIAGNOSIS — M545 Low back pain, unspecified: Secondary | ICD-10-CM

## 2022-01-06 DIAGNOSIS — M25562 Pain in left knee: Secondary | ICD-10-CM

## 2022-01-06 DIAGNOSIS — M25561 Pain in right knee: Secondary | ICD-10-CM | POA: Diagnosis not present

## 2022-01-06 MED ORDER — LIDOCAINE HCL 1 % IJ SOLN
2.0000 mL | INTRAMUSCULAR | Status: AC | PRN
  Administered 2022-01-06: 2 mL

## 2022-01-06 MED ORDER — BUPIVACAINE HCL 0.25 % IJ SOLN
2.0000 mL | INTRAMUSCULAR | Status: AC | PRN
  Administered 2022-01-06: 2 mL via INTRA_ARTICULAR

## 2022-01-06 MED ORDER — METHYLPREDNISOLONE ACETATE 40 MG/ML IJ SUSP
40.0000 mg | INTRAMUSCULAR | Status: AC | PRN
  Administered 2022-01-06: 40 mg via INTRA_ARTICULAR

## 2022-01-06 NOTE — Progress Notes (Signed)
Office Visit Note   Patient: Amanda Cochran           Date of Birth: November 27, 1928           MRN: [id number] Visit Date: 01/06/2022              Requested by: Crist Infante, MD [address] [place],  [place] [address] PCP: Crist Infante, MD   Assessment & Plan: Visit Diagnoses:  1. Chronic low back pain, unspecified back pain laterality, unspecified whether sciatica present   2. Chronic pain of both knees     Plan: Amanda Cochran is accompanied by a friend and here for evaluation of recurrent low back pain and bilateral knee pain.  She has had prior films demonstrating tricompartmental degenerative changes predominantly in the lateral compartment.  She is done well with cortisone injections.  She would like to have repeat injections today which I will perform. In addition she has been experiencing recurrent low back pain.  She had an MRI scan in 2018 demonstrating multilevel degenerative changes with a grade 1 anterior listhesis of L5-S1 resulting in severe bilateral neuroforaminal stenosis.  She had moderate central canal stenosis at L2-3 and L3-4 and severe right neuroforaminal stenosis at L3-4 due to a 7 mm right-sided synovial cyst impinging upon the exiting right L3 nerve root.  In addition she has had recurrent episodes of  Follow-Up Instructions: Return if symptoms worsen or fail to improve.   Orders:  Orders Placed This Encounter  Procedures   XR Lumbar Spine 2-3 Views   Ambulatory referral to Physical Medicine Rehab   No orders of the defined types were placed in this encounter.     Procedures: Large Joint Inj: L knee on 01/06/2022 1:53 PM Indications: pain and diagnostic evaluation Details: 25 G 1.5 in needle, anteromedial approach  Arthrogram: No  Medications: 2 mL lidocaine 1 %; 40 mg methylPREDNISolone acetate 40 MG/ML; 2 mL bupivacaine 0.25 % Procedure, treatment alternatives, risks and benefits explained, specific risks discussed. Consent was given by the patient.  Patient was prepped and draped in the usual sterile fashion.    Large Joint Inj: R knee on 01/06/2022 1:54 PM Indications: pain and diagnostic evaluation Details: 25 G 1.5 in needle, anterolateral approach  Arthrogram: No  Medications: 2 mL lidocaine 1 %; 2 mL bupivacaine 0.25 %; 40 mg methylPREDNISolone acetate 40 MG/ML Procedure, treatment alternatives, risks and benefits explained, specific risks discussed. Consent was given by the patient. Immediately prior to procedure a time out was called to verify the correct patient, procedure, equipment, support staff and site/side marked as required. Patient was prepped and draped in the usual sterile fashion.       Clinical Data: No additional findings.   Subjective: Chief Complaint  Patient presents with   Right Knee - Pain   Left Knee - Pain  Amanda Cochran relates that she is having recurrent episodes of low back pain without referred discomfort to either lower extremity.  She has had prior x-rays demonstrate diffuse degenerative changes and MRI scan demonstrating the same with areas of foraminal and central stenosis.  She saw Dr. [name] in 2021 for lumbar facet injections.  She notes she did very well and would like to have them again.  In addition she has bilateral knee osteoarthritis and would like to have cortisone injections.  She continues to live by herself.  Has had prior films of both knees demonstrating tricompartmental degenerative changes predominantly in the lateral compartments  HPI  Review  of Systems   Objective: Vital Signs: There were no vitals taken for this visit.  Physical Exam Constitutional:      Appearance: She is well-developed.  Pulmonary:     Effort: Pulmonary effort is normal.  Skin:    General: Skin is warm and dry.  Neurological:     Mental Status: She is alert and oriented to person, place, and time.  Psychiatric:        Behavior: Behavior normal.     Ortho Exam awake alert and oriented x3.   Comfortable sitting and in no acute distress.  Straight leg raise negative bilaterally.  Painless range of motion both hips.  No percussible tenderness of lumbar spine but she notes her pain is mostly when she is up and about and ambulating.  Both knees were without effusion but there was some lateral joint pain and slight valgus with weightbearing.  Uses a single-point cane.  No opening with varus or valgus stress.  No popliteal pain.  The knees were not hot warm or red.  No calf pain.  Motor exam intact  Specialty Comments:  No specialty comments available.  Imaging: XR Lumbar Spine 2-3 Views  Result Date: 01/06/2022 Films of the lumbar spine obtained in 2 projections and compared to films that were performed in 2021.  There are significant degenerative changes throughout the lumbar spine but particularly at L4-5 and L5-S1.  There is a grade 2 anterior listhesis of L5 on S1.  There is narrowing of the disc space at L4-5 and L5-S1 and facet changes as well.  Diffuse calcification of the abdominal aorta.  Overall there did not appear to be much change from the films from several years ago. no acute changes    PMFS History: Patient Active Problem List   Diagnosis Date Noted   Bilateral knee pain 11/27/2021   Pseudohallucination 10/05/2019   Intermediate stage nonexudative age-related macular degeneration of both eyes 10/05/2019   Optic disc atrophy 10/05/2019   Primary open angle glaucoma of both eyes, severe stage 10/05/2019   Low back pain 10/05/2019   MCI (mild cognitive impairment) 10/04/2019   Acute CVA (cerebrovascular accident) ([place]) 01/13/2019   Atrial fibrillation ([place]) 11/03/2018   Left sided lacunar stroke ([place]) 02/14/2014   Right hemiparesis ([place]) 02/09/2014   Cerebral infarction ([place]) 02/07/2014   Hyperlipidemia    Essential hypertension 02/03/2014   Past Medical History:  Diagnosis Date   Arthritis    Bladder infection    CVA (cerebral infarction) 02/07/2014   Left  basal ganglia infarct with right hemiparesis    Glaucoma    Glaucoma    Glaucoma (increased eye pressure)    Hyperlipidemia    Hypertension    Left sided lacunar stroke ([place]) 02/14/2014   Right hemiparesis ([place]) 02/09/2014   Left basal ganglia infarct Dec 2015    Stroke [facility])    Thyroid disease     Family History  Problem Relation Age of Onset   Heart failure Mother    AAA (abdominal aortic aneurysm) Father     Past Surgical History:  Procedure Laterality Date   cataract surgery Bilateral 2015   THYROID SURGERY  1976   TONSILLECTOMY AND ADENOIDECTOMY     VAGINAL PROLAPSE REPAIR     Social History   Occupational History   Occupation: retired    Comment: Glass blower/designer  Tobacco Use   Smoking status: Never   Smokeless tobacco: Never  Vaping Use   Vaping Use: Never used  Substance and  Sexual Activity   Alcohol use: No   Drug use: No   Sexual activity: Not on file     Garald Balding, MD   Note - This record has been created using [employer].  Chart creation errors have been sought, but may not always  have been located. Such creation errors do not reflect on  the standard of medical care.

## 2022-01-21 ENCOUNTER — Ambulatory Visit: Payer: Medicare Other | Admitting: Physical Medicine and Rehabilitation

## 2022-01-27 DIAGNOSIS — I4891 Unspecified atrial fibrillation: Secondary | ICD-10-CM | POA: Diagnosis not present

## 2022-01-27 DIAGNOSIS — R7301 Impaired fasting glucose: Secondary | ICD-10-CM | POA: Diagnosis not present

## 2022-01-27 DIAGNOSIS — Z1339 Encounter for screening examination for other mental health and behavioral disorders: Secondary | ICD-10-CM | POA: Diagnosis not present

## 2022-01-27 DIAGNOSIS — I69959 Hemiplegia and hemiparesis following unspecified cerebrovascular disease affecting unspecified side: Secondary | ICD-10-CM | POA: Diagnosis not present

## 2022-01-27 DIAGNOSIS — Z23 Encounter for immunization: Secondary | ICD-10-CM | POA: Diagnosis not present

## 2022-01-27 DIAGNOSIS — R82998 Other abnormal findings in urine: Secondary | ICD-10-CM | POA: Diagnosis not present

## 2022-01-27 DIAGNOSIS — C4490 Unspecified malignant neoplasm of skin, unspecified: Secondary | ICD-10-CM | POA: Diagnosis not present

## 2022-01-27 DIAGNOSIS — Z85828 Personal history of other malignant neoplasm of skin: Secondary | ICD-10-CM | POA: Diagnosis not present

## 2022-01-27 DIAGNOSIS — D692 Other nonthrombocytopenic purpura: Secondary | ICD-10-CM | POA: Diagnosis not present

## 2022-01-27 DIAGNOSIS — E039 Hypothyroidism, unspecified: Secondary | ICD-10-CM | POA: Diagnosis not present

## 2022-01-27 DIAGNOSIS — E559 Vitamin D deficiency, unspecified: Secondary | ICD-10-CM | POA: Diagnosis not present

## 2022-01-27 DIAGNOSIS — I10 Essential (primary) hypertension: Secondary | ICD-10-CM | POA: Diagnosis not present

## 2022-01-27 DIAGNOSIS — Z0000 Encounter for general adult medical examination without abnormal findings: Secondary | ICD-10-CM | POA: Diagnosis not present

## 2022-01-27 DIAGNOSIS — R809 Proteinuria, unspecified: Secondary | ICD-10-CM | POA: Diagnosis not present

## 2022-01-27 DIAGNOSIS — Z1331 Encounter for screening for depression: Secondary | ICD-10-CM | POA: Diagnosis not present

## 2022-01-27 DIAGNOSIS — E785 Hyperlipidemia, unspecified: Secondary | ICD-10-CM | POA: Diagnosis not present

## 2022-02-02 DIAGNOSIS — H401133 Primary open-angle glaucoma, bilateral, severe stage: Secondary | ICD-10-CM | POA: Diagnosis not present

## 2022-04-27 ENCOUNTER — Encounter (INDEPENDENT_AMBULATORY_CARE_PROVIDER_SITE_OTHER): Payer: Medicare Other | Admitting: Ophthalmology

## 2022-06-29 DIAGNOSIS — H401133 Primary open-angle glaucoma, bilateral, severe stage: Secondary | ICD-10-CM | POA: Diagnosis not present

## 2022-08-19 ENCOUNTER — Emergency Department (HOSPITAL_COMMUNITY): Payer: Medicare Other

## 2022-08-19 ENCOUNTER — Other Ambulatory Visit: Payer: Self-pay

## 2022-08-19 ENCOUNTER — Encounter (HOSPITAL_COMMUNITY): Payer: Self-pay

## 2022-08-19 ENCOUNTER — Inpatient Hospital Stay (HOSPITAL_COMMUNITY)
Admission: EM | Admit: 2022-08-19 | Discharge: 2022-09-16 | DRG: 689 | Disposition: A | Discharge status: Skilled Nursing Facility | Payer: Medicare Other | Attending: Family Medicine | Admitting: Family Medicine

## 2022-08-19 DIAGNOSIS — G8191 Hemiplegia, unspecified affecting right dominant side: Secondary | ICD-10-CM | POA: Diagnosis not present

## 2022-08-19 DIAGNOSIS — G4730 Sleep apnea, unspecified: Secondary | ICD-10-CM | POA: Diagnosis present

## 2022-08-19 DIAGNOSIS — Z7901 Long term (current) use of anticoagulants: Secondary | ICD-10-CM

## 2022-08-19 DIAGNOSIS — I517 Cardiomegaly: Secondary | ICD-10-CM | POA: Diagnosis not present

## 2022-08-19 DIAGNOSIS — I700 Atherosclerosis of aorta: Secondary | ICD-10-CM | POA: Diagnosis not present

## 2022-08-19 DIAGNOSIS — I69351 Hemiplegia and hemiparesis following cerebral infarction affecting right dominant side: Secondary | ICD-10-CM

## 2022-08-19 DIAGNOSIS — H401130 Primary open-angle glaucoma, bilateral, stage unspecified: Secondary | ICD-10-CM | POA: Diagnosis not present

## 2022-08-19 DIAGNOSIS — Z79890 Hormone replacement therapy (postmenopausal): Secondary | ICD-10-CM | POA: Diagnosis not present

## 2022-08-19 DIAGNOSIS — F0392 Unspecified dementia, unspecified severity, with psychotic disturbance: Secondary | ICD-10-CM | POA: Diagnosis not present

## 2022-08-19 DIAGNOSIS — I482 Chronic atrial fibrillation, unspecified: Secondary | ICD-10-CM | POA: Diagnosis present

## 2022-08-19 DIAGNOSIS — H548 Legal blindness, as defined in USA: Secondary | ICD-10-CM | POA: Diagnosis not present

## 2022-08-19 DIAGNOSIS — J189 Pneumonia, unspecified organism: Secondary | ICD-10-CM | POA: Diagnosis present

## 2022-08-19 DIAGNOSIS — B9620 Unspecified Escherichia coli [E. coli] as the cause of diseases classified elsewhere: Secondary | ICD-10-CM | POA: Diagnosis present

## 2022-08-19 DIAGNOSIS — Z5982 Transportation insecurity: Secondary | ICD-10-CM

## 2022-08-19 DIAGNOSIS — Z7401 Bed confinement status: Secondary | ICD-10-CM | POA: Diagnosis not present

## 2022-08-19 DIAGNOSIS — J90 Pleural effusion, not elsewhere classified: Secondary | ICD-10-CM | POA: Diagnosis not present

## 2022-08-19 DIAGNOSIS — Z79899 Other long term (current) drug therapy: Secondary | ICD-10-CM

## 2022-08-19 DIAGNOSIS — H353130 Nonexudative age-related macular degeneration, bilateral, stage unspecified: Secondary | ICD-10-CM | POA: Diagnosis not present

## 2022-08-19 DIAGNOSIS — Z1389 Encounter for screening for other disorder: Secondary | ICD-10-CM | POA: Diagnosis not present

## 2022-08-19 DIAGNOSIS — N179 Acute kidney failure, unspecified: Secondary | ICD-10-CM | POA: Diagnosis not present

## 2022-08-19 DIAGNOSIS — Z515 Encounter for palliative care: Secondary | ICD-10-CM

## 2022-08-19 DIAGNOSIS — I480 Paroxysmal atrial fibrillation: Secondary | ICD-10-CM | POA: Diagnosis not present

## 2022-08-19 DIAGNOSIS — H401133 Primary open-angle glaucoma, bilateral, severe stage: Secondary | ICD-10-CM | POA: Diagnosis not present

## 2022-08-19 DIAGNOSIS — E876 Hypokalemia: Secondary | ICD-10-CM | POA: Diagnosis present

## 2022-08-19 DIAGNOSIS — N390 Urinary tract infection, site not specified: Principal | ICD-10-CM | POA: Diagnosis present

## 2022-08-19 DIAGNOSIS — R4182 Altered mental status, unspecified: Secondary | ICD-10-CM | POA: Diagnosis not present

## 2022-08-19 DIAGNOSIS — I4891 Unspecified atrial fibrillation: Secondary | ICD-10-CM

## 2022-08-19 DIAGNOSIS — M47812 Spondylosis without myelopathy or radiculopathy, cervical region: Secondary | ICD-10-CM | POA: Diagnosis not present

## 2022-08-19 DIAGNOSIS — M1990 Unspecified osteoarthritis, unspecified site: Secondary | ICD-10-CM | POA: Diagnosis present

## 2022-08-19 DIAGNOSIS — F411 Generalized anxiety disorder: Secondary | ICD-10-CM | POA: Diagnosis not present

## 2022-08-19 DIAGNOSIS — Z8673 Personal history of transient ischemic attack (TIA), and cerebral infarction without residual deficits: Secondary | ICD-10-CM

## 2022-08-19 DIAGNOSIS — I6381 Other cerebral infarction due to occlusion or stenosis of small artery: Secondary | ICD-10-CM | POA: Diagnosis present

## 2022-08-19 DIAGNOSIS — I130 Hypertensive heart and chronic kidney disease with heart failure and stage 1 through stage 4 chronic kidney disease, or unspecified chronic kidney disease: Secondary | ICD-10-CM | POA: Diagnosis not present

## 2022-08-19 DIAGNOSIS — G9341 Metabolic encephalopathy: Secondary | ICD-10-CM | POA: Diagnosis present

## 2022-08-19 DIAGNOSIS — R410 Disorientation, unspecified: Secondary | ICD-10-CM | POA: Diagnosis not present

## 2022-08-19 DIAGNOSIS — E038 Other specified hypothyroidism: Secondary | ICD-10-CM | POA: Diagnosis not present

## 2022-08-19 DIAGNOSIS — I083 Combined rheumatic disorders of mitral, aortic and tricuspid valves: Secondary | ICD-10-CM | POA: Diagnosis not present

## 2022-08-19 DIAGNOSIS — I5021 Acute systolic (congestive) heart failure: Secondary | ICD-10-CM | POA: Diagnosis not present

## 2022-08-19 DIAGNOSIS — I6782 Cerebral ischemia: Secondary | ICD-10-CM | POA: Diagnosis not present

## 2022-08-19 DIAGNOSIS — I5022 Chronic systolic (congestive) heart failure: Secondary | ICD-10-CM | POA: Diagnosis not present

## 2022-08-19 DIAGNOSIS — N1831 Chronic kidney disease, stage 3a: Secondary | ICD-10-CM | POA: Diagnosis not present

## 2022-08-19 DIAGNOSIS — R442 Other hallucinations: Secondary | ICD-10-CM | POA: Diagnosis not present

## 2022-08-19 DIAGNOSIS — F05 Delirium due to known physiological condition: Secondary | ICD-10-CM | POA: Diagnosis not present

## 2022-08-19 DIAGNOSIS — I10 Essential (primary) hypertension: Secondary | ICD-10-CM | POA: Diagnosis present

## 2022-08-19 DIAGNOSIS — E039 Hypothyroidism, unspecified: Secondary | ICD-10-CM | POA: Diagnosis present

## 2022-08-19 DIAGNOSIS — E785 Hyperlipidemia, unspecified: Secondary | ICD-10-CM | POA: Diagnosis present

## 2022-08-19 DIAGNOSIS — R531 Weakness: Secondary | ICD-10-CM | POA: Diagnosis not present

## 2022-08-19 DIAGNOSIS — F22 Delusional disorders: Secondary | ICD-10-CM | POA: Diagnosis present

## 2022-08-19 DIAGNOSIS — G3184 Mild cognitive impairment, so stated: Secondary | ICD-10-CM | POA: Diagnosis not present

## 2022-08-19 DIAGNOSIS — H04412 Chronic dacryocystitis of left lacrimal passage: Secondary | ICD-10-CM | POA: Diagnosis present

## 2022-08-19 DIAGNOSIS — R41841 Cognitive communication deficit: Secondary | ICD-10-CM | POA: Diagnosis not present

## 2022-08-19 DIAGNOSIS — F32A Depression, unspecified: Secondary | ICD-10-CM | POA: Diagnosis present

## 2022-08-19 DIAGNOSIS — Z8249 Family history of ischemic heart disease and other diseases of the circulatory system: Secondary | ICD-10-CM

## 2022-08-19 DIAGNOSIS — M6281 Muscle weakness (generalized): Secondary | ICD-10-CM | POA: Diagnosis not present

## 2022-08-19 DIAGNOSIS — H547 Unspecified visual loss: Secondary | ICD-10-CM | POA: Diagnosis not present

## 2022-08-19 DIAGNOSIS — H353132 Nonexudative age-related macular degeneration, bilateral, intermediate dry stage: Secondary | ICD-10-CM | POA: Diagnosis not present

## 2022-08-19 DIAGNOSIS — F0390 Unspecified dementia without behavioral disturbance: Secondary | ICD-10-CM | POA: Diagnosis not present

## 2022-08-19 DIAGNOSIS — R443 Hallucinations, unspecified: Principal | ICD-10-CM

## 2022-08-19 DIAGNOSIS — N3000 Acute cystitis without hematuria: Secondary | ICD-10-CM | POA: Diagnosis not present

## 2022-08-19 DIAGNOSIS — I672 Cerebral atherosclerosis: Secondary | ICD-10-CM | POA: Diagnosis not present

## 2022-08-19 DIAGNOSIS — J188 Other pneumonia, unspecified organism: Secondary | ICD-10-CM | POA: Diagnosis not present

## 2022-08-19 DIAGNOSIS — R918 Other nonspecific abnormal finding of lung field: Secondary | ICD-10-CM | POA: Diagnosis not present

## 2022-08-19 DIAGNOSIS — R0600 Dyspnea, unspecified: Secondary | ICD-10-CM | POA: Diagnosis not present

## 2022-08-19 DIAGNOSIS — R1312 Dysphagia, oropharyngeal phase: Secondary | ICD-10-CM | POA: Diagnosis not present

## 2022-08-19 DIAGNOSIS — I4819 Other persistent atrial fibrillation: Secondary | ICD-10-CM | POA: Diagnosis not present

## 2022-08-19 DIAGNOSIS — N171 Acute kidney failure with acute cortical necrosis: Secondary | ICD-10-CM | POA: Diagnosis not present

## 2022-08-19 DIAGNOSIS — R2689 Other abnormalities of gait and mobility: Secondary | ICD-10-CM | POA: Diagnosis not present

## 2022-08-19 DIAGNOSIS — M160 Bilateral primary osteoarthritis of hip: Secondary | ICD-10-CM | POA: Diagnosis not present

## 2022-08-19 DIAGNOSIS — I498 Other specified cardiac arrhythmias: Secondary | ICD-10-CM | POA: Diagnosis not present

## 2022-08-19 HISTORY — DX: Unspecified visual loss: H54.7

## 2022-08-19 LAB — CBC WITH DIFFERENTIAL/PLATELET
Abs Immature Granulocytes: 0.03 10*3/uL (ref 0.00–0.07)
Basophils Absolute: 0.1 10*3/uL (ref 0.0–0.1)
Basophils Relative: 1 %
Eosinophils Absolute: 0 10*3/uL (ref 0.0–0.5)
Eosinophils Relative: 0 %
HCT: 43.1 % (ref 36.0–46.0)
Hemoglobin: 13.5 g/dL (ref 12.0–15.0)
Immature Granulocytes: 0 %
Lymphocytes Relative: 16 %
Lymphs Abs: 1.7 10*3/uL (ref 0.7–4.0)
MCH: 28.7 pg (ref 26.0–34.0)
MCHC: 31.3 g/dL (ref 30.0–36.0)
MCV: 91.7 fL (ref 80.0–100.0)
Monocytes Absolute: 0.9 10*3/uL (ref 0.1–1.0)
Monocytes Relative: 8 %
Neutro Abs: 8.4 10*3/uL — ABNORMAL HIGH (ref 1.7–7.7)
Neutrophils Relative %: 75 %
Platelets: 181 10*3/uL (ref 150–400)
RBC: 4.7 MIL/uL (ref 3.87–5.11)
RDW: 16.9 % — ABNORMAL HIGH (ref 11.5–15.5)
WBC: 11.1 10*3/uL — ABNORMAL HIGH (ref 4.0–10.5)
nRBC: 0 % (ref 0.0–0.2)

## 2022-08-19 LAB — COMPREHENSIVE METABOLIC PANEL
ALT: 25 U/L (ref 0–44)
AST: 37 U/L (ref 15–41)
Albumin: 3.8 g/dL (ref 3.5–5.0)
Alkaline Phosphatase: 46 U/L (ref 38–126)
Anion gap: 11 (ref 5–15)
BUN: 31 mg/dL — ABNORMAL HIGH (ref 8–23)
CO2: 26 mmol/L (ref 22–32)
Calcium: 9.6 mg/dL (ref 8.9–10.3)
Chloride: 104 mmol/L (ref 98–111)
Creatinine, Ser: 1.31 mg/dL — ABNORMAL HIGH (ref 0.44–1.00)
GFR, Estimated: 38 mL/min — ABNORMAL LOW (ref >60)
Glucose, Bld: 101 mg/dL — ABNORMAL HIGH (ref 70–99)
Potassium: 3.6 mmol/L (ref 3.5–5.1)
Sodium: 141 mmol/L (ref 135–145)
Total Bilirubin: 1.3 mg/dL — ABNORMAL HIGH (ref 0.3–1.2)
Total Protein: 7 g/dL (ref 6.5–8.1)

## 2022-08-19 LAB — ETHANOL: Alcohol, Ethyl (B): <10 mg/dL (ref <10)

## 2022-08-19 MED ORDER — ATORVASTATIN CALCIUM 40 MG PO TABS: 40.0000 mg | ORAL_TABLET | Freq: Every day | ORAL | Status: DC

## 2022-08-19 MED ORDER — LEVOTHYROXINE SODIUM 100 MCG PO TABS
100.0000 ug | ORAL_TABLET | Freq: Every day | ORAL | Status: DC
  Administered 2022-08-20 – 2022-09-16 (×25): 100 ug via ORAL
  Filled 2022-08-19 (×25): qty 1

## 2022-08-19 MED ORDER — VITAMIN D 25 MCG (1000 UNIT) PO TABS
2000.0000 [IU] | ORAL_TABLET | Freq: Every day | ORAL | Status: DC
  Administered 2022-08-20 – 2022-09-16 (×27): 2000 [IU] via ORAL
  Filled 2022-08-19 (×28): qty 2

## 2022-08-19 MED ORDER — DORZOLAMIDE HCL-TIMOLOL MAL 2-0.5 % OP SOLN
1.0000 [drp] | Freq: Two times a day (BID) | OPHTHALMIC | Status: DC
  Administered 2022-08-20 – 2022-09-16 (×51): 1 [drp] via OPHTHALMIC
  Filled 2022-08-19: qty 10

## 2022-08-19 MED ORDER — LATANOPROST 0.005 % OP SOLN
1.0000 [drp] | Freq: Every day | OPHTHALMIC | Status: DC
  Administered 2022-08-20 – 2022-09-15 (×24): 1 [drp] via OPHTHALMIC
  Filled 2022-08-19 (×3): qty 2.5

## 2022-08-19 MED ORDER — AMLODIPINE BESYLATE 5 MG PO TABS
5.0000 mg | ORAL_TABLET | Freq: Every day | ORAL | Status: DC
  Administered 2022-08-20: 5 mg via ORAL
  Filled 2022-08-19: qty 1

## 2022-08-19 MED ORDER — PILOCARPINE HCL 1 % OP SOLN
1.0000 [drp] | Freq: Four times a day (QID) | OPHTHALMIC | Status: DC
  Administered 2022-08-20 – 2022-09-16 (×97): 1 [drp] via OPHTHALMIC
  Filled 2022-08-19: qty 15

## 2022-08-19 MED ORDER — APIXABAN 5 MG PO TABS
5.0000 mg | ORAL_TABLET | Freq: Two times a day (BID) | ORAL | Status: DC
  Administered 2022-08-20 – 2022-09-16 (×53): 5 mg via ORAL
  Filled 2022-08-19 (×55): qty 1

## 2022-08-19 MED ORDER — BENAZEPRIL HCL 20 MG PO TABS
20.0000 mg | ORAL_TABLET | Freq: Every day | ORAL | Status: DC
  Administered 2022-08-20 – 2022-08-21 (×2): 20 mg via ORAL
  Filled 2022-08-19 (×2): qty 1

## 2022-08-19 MED ORDER — HYDRALAZINE HCL 25 MG PO TABS
25.0000 mg | ORAL_TABLET | Freq: Three times a day (TID) | ORAL | Status: DC
  Administered 2022-08-20 (×3): 25 mg via ORAL
  Filled 2022-08-19 (×3): qty 1

## 2022-08-19 MED ORDER — LEVOTHYROXINE SODIUM 100 MCG PO TABS: 100.0000 ug | ORAL_TABLET | Freq: Every day | ORAL | Status: DC

## 2022-08-19 NOTE — ED Provider Notes (Signed)
[facility]ENC[facility] DEPARTMENT AT [facility] Provider Note   CSN: [id number] Arrival date & time: 08/19/22  1815     History  Chief Complaint  Patient presents with   Hallucinations    Amanda Cochran is a 89 y.o. female.  HPI   Patient has a history of thyroid disease, hyperlipidemia, hypertension glaucoma, stroke.  Patient has limited vision at baseline patient .  presents to the ED for evaluation of hallucinations.  Patient states there are people breaking into her home.  She can tell because the beds were messed up and there was an odor in the bathroom.  Patient states she has not been eating or sleeping well.  She is anxious because the people in her house.  Neighbors heard the patient screaming.  They called 911.  Police were not able to see anyone in the home more evidence that someone had breaking into her home.  Patient denies any trouble with any headache.  No focal numbness or weakness.  Home Medications Prior to Admission medications   Medication Sig Start Date End Date Taking? Authorizing Provider  amLODipine (NORVASC) 5 MG tablet Take 1 tablet by mouth daily. 03/03/18   [provider]  Ascorbic Acid (VITAMIN C PO) Take 1 tablet by mouth daily.     [provider]  atorvastatin (LIPITOR) 40 MG tablet Take 1 tablet (40 mg total) by mouth daily at 6 PM. 01/14/19 01/15/29  Jerald Kief, MD  benazepril (LOTENSIN) 20 MG tablet Take 20 mg by mouth daily.    [provider]  Cholecalciferol (VITAMIN D) 2000 UNITS CAPS Take 1 capsule by mouth daily.    [provider]  dorzolamide-timolol (COSOPT) 22.3-6.8 MG/ML ophthalmic solution Place 1 drop into both eyes 2 (two) times daily.    [provider]  ELIQUIS 5 MG TABS tablet Take 5 mg by mouth 2 (two) times daily. 01/16/19   [provider]  hydrALAZINE (APRESOLINE) 25 MG tablet Take 1 tablet (25 mg total) by mouth every 8 (eight) hours. 02/19/14   Angiulli, Mcarthur Rossetti, PA-C  latanoprost (XALATAN) 0.005 % ophthalmic solution Place 1 drop into both eyes at bedtime.     [provider]  levothyroxine (SYNTHROID, LEVOTHROID) 100 MCG tablet Take 1 tablet (100 mcg total) by mouth daily. Patient taking differently: Take 100 mcg by mouth daily before breakfast.  02/19/14   Angiulli, Mcarthur Rossetti, PA-C  Multiple Vitamins-Minerals (CENTRUM SILVER PO) Take 1 tablet by mouth daily.    [provider]  Omega-3 Fatty Acids (FISH OIL) 1000 MG CAPS Take 1,000 mg by mouth daily.     [provider]  pilocarpine (PILOCAR) 1 % ophthalmic solution Place 1 drop into both eyes 4 (four) times daily.  08/06/16   [provider]      Allergies    Patient has no known allergies.    Review of Systems   Review of Systems  Physical Exam Updated Vital Signs BP (!) 167/111 (BP Location: Left Arm)   Pulse (!) 102   Temp (!) 97.5 F (36.4 C) (Oral)   Resp 19   Ht 1.651 m (5\' 5" )   Wt 70 kg   SpO2 94%   BMI 25.68 kg/m  Physical Exam Vitals and nursing note reviewed.  Constitutional:      Appearance: She is well-developed. She is not diaphoretic.  HENT:     Head: Normocephalic and atraumatic.     Right Ear: External ear normal.  Left Ear: External ear normal.  Eyes:     General: No scleral icterus.       Right eye: No discharge.        Left eye: No discharge.     Conjunctiva/sclera: Conjunctivae normal.  Neck:     Trachea: No tracheal deviation.  Cardiovascular:     Rate and Rhythm: Normal rate and regular rhythm.  Pulmonary:     Effort: Pulmonary effort is normal. No respiratory distress.     Breath sounds: Normal breath sounds. No stridor. No wheezing or rales.  Abdominal:     General: Bowel sounds are normal. There is no distension.     Palpations: Abdomen is soft.     Tenderness: There is no abdominal tenderness. There is no guarding or rebound.  Musculoskeletal:        General: No tenderness or deformity.     Cervical  back: Neck supple.  Skin:    General: Skin is warm and dry.     Findings: No rash.  Neurological:     General: No focal deficit present.     Mental Status: She is alert.     Cranial Nerves: No cranial nerve deficit, dysarthria or facial asymmetry.     Sensory: No sensory deficit.     Motor: No weakness, tremor, abnormal muscle tone or seizure activity.     Coordination: Coordination normal.  Psychiatric:        Mood and Affect: Mood normal.     ED Results / Procedures / Treatments   Labs (all labs ordered are listed, but only abnormal results are displayed) Labs Reviewed  COMPREHENSIVE METABOLIC PANEL - Abnormal; Notable for the following components:      Result Value   Glucose, Bld 101 (*)    BUN 31 (*)    Creatinine, Ser 1.31 (*)    Total Bilirubin 1.3 (*)    GFR, Estimated 38 (*)    All other components within normal limits  CBC WITH DIFFERENTIAL/PLATELET - Abnormal; Notable for the following components:   WBC 11.1 (*)    RDW 16.9 (*)    Neutro Abs 8.4 (*)    All other components within normal limits  ETHANOL  RAPID URINE DRUG SCREEN, HOSP PERFORMED  URINALYSIS, ROUTINE W REFLEX MICROSCOPIC    EKG None Atrial fibrillation, right axis, normal ST-T obvious, rate 80 Radiology CT Head Wo Contrast  Result Date: 08/19/2022 CLINICAL DATA:  Mental status change EXAM: CT HEAD WITHOUT CONTRAST TECHNIQUE: Contiguous axial images were obtained from the base of the skull through the vertex without intravenous contrast. RADIATION DOSE REDUCTION: This exam was performed according to the departmental dose-optimization program which includes automated exposure control, adjustment of the mA and/or kV according to patient size and/or use of iterative reconstruction technique. COMPARISON:  01/12/2019 CT head FINDINGS: Brain: No evidence of acute infarction, hemorrhage, mass, mass effect, or midline shift. No hydrocephalus or extra-axial fluid collection. Redemonstrated remote right  parietal and left basal ganglia infarcts. Periventricular white matter changes, likely the sequela of chronic small vessel ischemic disease. Vascular: No hyperdense vessel. Atherosclerotic calcifications in the intracranial carotid and vertebral arteries. Skull: Negative for fracture or focal lesion. Sinuses/Orbits: Mucosal thickening in the ethmoid air cells. Status post bilateral lens replacements. Other: None. IMPRESSION: No acute intracranial process. Electronically Signed   By: Wiliam Ke M.D.   On: 08/19/2022 19:28    Procedures Procedures    Medications Ordered in ED Medications - No data to display  ED  Course/ Medical Decision Making/ A&P Clinical Course as of 08/19/22 2327  Wed Aug 19, 2022  1939 Head CT without acute findings [JK]  2232 CBC and metabolic panel are unremarkable. [JK]    Clinical Course User Index [JK] Linwood Dibbles, MD                             Medical Decision Making Problems Addressed: Atrial fibrillation, unspecified type [facility]): undiagnosed new problem with uncertain prognosis Hallucinations: acute illness or injury that poses a threat to life or bodily functions  Amount and/or Complexity of Data Reviewed Labs: ordered. Radiology: ordered.   Patient brought to the emergency room for evaluation of hallucinations.  Patient was concerned there is someone trying to break into her house however police did not find anyone.  Patient has been paranoid.  She has not been eating or drinking.  No signs of acute medical issues.  She does not have a history of having problems with paranoia or delusions.  No history of dementia.  Urinalysis still pending but I will consult with psychiatry to get their recommendations  The patient has been placed in psychiatric observation due to the need to provide a safe environment for the patient while obtaining psychiatric consultation and evaluation, as well as ongoing medical and medication management to treat the patient's  condition.  The patient has not been placed under full IVC at this time.         Final Clinical Impression(s) / ED Diagnoses Final diagnoses:  Atrial fibrillation, unspecified type [facility])  Hallucinations    Rx / DC Orders ED Discharge Orders     None         Linwood Dibbles, MD 08/19/22 2328

## 2022-08-19 NOTE — ED Triage Notes (Addendum)
Patient BIB [facility] from home. Her neighbor called 911 because they heard patient screaming. Patient said there are people in her house, but police did not find anyone in the house. Has not been eating or sleeping because she is anxious about people in her house. Patient alert to self and place. Not time or why she is here.   Family member lives 5 hours away in [place], niece, [phone]

## 2022-08-20 ENCOUNTER — Encounter (HOSPITAL_COMMUNITY): Payer: Self-pay

## 2022-08-20 ENCOUNTER — Observation Stay (HOSPITAL_COMMUNITY): Payer: Medicare Other

## 2022-08-20 DIAGNOSIS — G9341 Metabolic encephalopathy: Secondary | ICD-10-CM | POA: Diagnosis present

## 2022-08-20 DIAGNOSIS — I498 Other specified cardiac arrhythmias: Secondary | ICD-10-CM | POA: Diagnosis not present

## 2022-08-20 DIAGNOSIS — I517 Cardiomegaly: Secondary | ICD-10-CM | POA: Diagnosis not present

## 2022-08-20 DIAGNOSIS — R410 Disorientation, unspecified: Secondary | ICD-10-CM | POA: Diagnosis present

## 2022-08-20 DIAGNOSIS — F0390 Unspecified dementia without behavioral disturbance: Secondary | ICD-10-CM

## 2022-08-20 DIAGNOSIS — I083 Combined rheumatic disorders of mitral, aortic and tricuspid valves: Secondary | ICD-10-CM

## 2022-08-20 LAB — URINALYSIS, ROUTINE W REFLEX MICROSCOPIC
Bilirubin Urine: NEGATIVE
Glucose, UA: NEGATIVE mg/dL
Hgb urine dipstick: NEGATIVE
Ketones, ur: 5 mg/dL — AB
Leukocytes,Ua: NEGATIVE
Nitrite: POSITIVE — AB
Protein, ur: 30 mg/dL — AB
Specific Gravity, Urine: 1.023 (ref 1.005–1.030)
pH: 5 (ref 5.0–8.0)

## 2022-08-20 LAB — CBC WITH DIFFERENTIAL/PLATELET
Abs Immature Granulocytes: 0.03 10*3/uL (ref 0.00–0.07)
Basophils Absolute: 0.1 10*3/uL (ref 0.0–0.1)
Basophils Relative: 1 %
Eosinophils Absolute: 0.1 10*3/uL (ref 0.0–0.5)
Eosinophils Relative: 1 %
HCT: 43.6 % (ref 36.0–46.0)
Hemoglobin: 14 g/dL (ref 12.0–15.0)
Immature Granulocytes: 0 %
Lymphocytes Relative: 17 %
Lymphs Abs: 1.6 10*3/uL (ref 0.7–4.0)
MCH: 29.4 pg (ref 26.0–34.0)
MCHC: 32.1 g/dL (ref 30.0–36.0)
MCV: 91.4 fL (ref 80.0–100.0)
Monocytes Absolute: 0.5 10*3/uL (ref 0.1–1.0)
Monocytes Relative: 6 %
Neutro Abs: 7 10*3/uL (ref 1.7–7.7)
Neutrophils Relative %: 75 %
Platelets: 195 10*3/uL (ref 150–400)
RBC: 4.77 MIL/uL (ref 3.87–5.11)
RDW: 16.9 % — ABNORMAL HIGH (ref 11.5–15.5)
WBC: 9.3 10*3/uL (ref 4.0–10.5)
nRBC: 0 % (ref 0.0–0.2)

## 2022-08-20 LAB — COMPREHENSIVE METABOLIC PANEL
ALT: 22 U/L (ref 0–44)
AST: 34 U/L (ref 15–41)
Albumin: 3.6 g/dL (ref 3.5–5.0)
Alkaline Phosphatase: 46 U/L (ref 38–126)
Anion gap: 10 (ref 5–15)
BUN: 29 mg/dL — ABNORMAL HIGH (ref 8–23)
CO2: 24 mmol/L (ref 22–32)
Calcium: 9 mg/dL (ref 8.9–10.3)
Chloride: 105 mmol/L (ref 98–111)
Creatinine, Ser: 1.2 mg/dL — ABNORMAL HIGH (ref 0.44–1.00)
GFR, Estimated: 43 mL/min — ABNORMAL LOW (ref >60)
Glucose, Bld: 109 mg/dL — ABNORMAL HIGH (ref 70–99)
Potassium: 3.4 mmol/L — ABNORMAL LOW (ref 3.5–5.1)
Sodium: 139 mmol/L (ref 135–145)
Total Bilirubin: 1.2 mg/dL (ref 0.3–1.2)
Total Protein: 6.6 g/dL (ref 6.5–8.1)

## 2022-08-20 LAB — ECHOCARDIOGRAM COMPLETE
AR max vel: 1.57 cm2
AV Area VTI: 2 cm2
AV Area mean vel: 1.72 cm2
AV Mean grad: 2 mmHg
AV Peak grad: 2.8 mmHg
Ao pk vel: 0.83 m/s
Area-P 1/2: 6.48 cm2
Height: 65 in
P 1/2 time: 508 msec
S' Lateral: 5 cm
Weight: 2190.49 oz

## 2022-08-20 LAB — RAPID URINE DRUG SCREEN, HOSP PERFORMED
Amphetamines: NOT DETECTED
Barbiturates: NOT DETECTED
Benzodiazepines: NOT DETECTED
Cocaine: NOT DETECTED
Opiates: NOT DETECTED
Tetrahydrocannabinol: NOT DETECTED

## 2022-08-20 LAB — AMMONIA: Ammonia: <10 umol/L (ref 9–35)

## 2022-08-20 LAB — VITAMIN D 25 HYDROXY (VIT D DEFICIENCY, FRACTURES): Vit D, 25-Hydroxy: 48.09 ng/mL (ref 30–100)

## 2022-08-20 LAB — HIV ANTIBODY (ROUTINE TESTING W REFLEX): HIV Screen 4th Generation wRfx: NONREACTIVE

## 2022-08-20 LAB — MAGNESIUM: Magnesium: 1.9 mg/dL (ref 1.7–2.4)

## 2022-08-20 LAB — VITAMIN B12: Vitamin B-12: 665 pg/mL (ref 180–914)

## 2022-08-20 LAB — TSH: TSH: 6.142 u[IU]/mL — ABNORMAL HIGH (ref 0.350–4.500)

## 2022-08-20 MED ORDER — POTASSIUM CHLORIDE CRYS ER 20 MEQ PO TBCR
40.0000 meq | EXTENDED_RELEASE_TABLET | Freq: Once | ORAL | Status: AC
  Administered 2022-08-20: 40 meq via ORAL
  Filled 2022-08-20: qty 2

## 2022-08-20 MED ORDER — SODIUM CHLORIDE 0.9 % IV SOLN
1.0000 g | INTRAVENOUS | Status: DC
  Administered 2022-08-21 – 2022-08-23 (×3): 1 g via INTRAVENOUS
  Filled 2022-08-20 (×3): qty 10

## 2022-08-20 MED ORDER — MELATONIN 3 MG PO TABS: 3.0000 mg | ORAL_TABLET | Freq: Every evening | ORAL | Status: DC | PRN

## 2022-08-20 MED ORDER — METOPROLOL SUCCINATE ER 25 MG PO TB24
25.0000 mg | ORAL_TABLET | Freq: Every day | ORAL | Status: DC
  Administered 2022-08-20 – 2022-08-21 (×2): 25 mg via ORAL
  Filled 2022-08-20 (×2): qty 1

## 2022-08-20 MED ORDER — SODIUM CHLORIDE 0.9 % IV SOLN
1.0000 g | Freq: Once | INTRAVENOUS | Status: AC
  Administered 2022-08-20: 1 g via INTRAVENOUS
  Filled 2022-08-20: qty 10

## 2022-08-20 MED ORDER — ACETAMINOPHEN 650 MG RE SUPP: 650.0000 mg | Freq: Four times a day (QID) | RECTAL | Status: DC | PRN

## 2022-08-20 MED ORDER — ONDANSETRON HCL 4 MG/2ML IJ SOLN
4.0000 mg | Freq: Four times a day (QID) | INTRAMUSCULAR | Status: DC | PRN
  Administered 2022-08-24 – 2022-08-26 (×2): 4 mg via INTRAVENOUS
  Filled 2022-08-20 (×2): qty 2

## 2022-08-20 MED ORDER — METOPROLOL SUCCINATE ER 25 MG PO TB24: 12.5000 mg | ORAL_TABLET | Freq: Every day | ORAL | Status: DC

## 2022-08-20 MED ORDER — ACETAMINOPHEN 325 MG PO TABS
650.0000 mg | ORAL_TABLET | Freq: Four times a day (QID) | ORAL | Status: DC | PRN
  Administered 2022-09-05 – 2022-09-11 (×2): 650 mg via ORAL
  Filled 2022-08-20 (×3): qty 2

## 2022-08-20 NOTE — BH Assessment (Signed)
This TTS consult has been deferred to [facility].   The patient is scheduled to be seen by Dr. Maryelizabeth Kaufmann at 1:15a. The care team has been notified via secure chat.  Manfred Arch, MSW, LCSW Triage Specialist [phone]

## 2022-08-20 NOTE — Progress Notes (Signed)
Per psych provider Alona Bene, PMHNP pt has been psych cleared. This CSW will now remove pt from the [facility] shift report. TOC to assist if there is an identified need for discharge.  Maryjean Ka, MSW, LCSWA 08/20/2022 1:02 PM

## 2022-08-20 NOTE — Progress Notes (Addendum)
  Carryover admission to the Day Admitter.  I discussed this case with the EDP, Dr. Wilkie Aye.  Per these discussions:   This is a  89 y.o. female who is being admitted for acute metabolic encephalopathy in the setting of urinary tract infection after being brought in by her family for evaluation of confusion over the last 1 to 2 days associated with potential new onset hallucinations.  Patient lives by herself, and the family is concerned as to the patient's ability to care for herself in the setting of her new onset confusion and potential hallucinations.  Ensuing workup included urinalysis that was reported to be suggestive of urinary tract infection.  EDP discussed patient's case with on-call psychiatry, who have evaluated the patient and do not feel that there is any underlying psychiatric issues contributing to her new onset confusion and hallucinations, but rather there is suspicion for metabolic source of these findings, namely the aforementioned urinary tract infection.  Additional labs include mild leukocytosis.  Urine culture pending.  She was started on Rocephin in the emergency department.  I have placed an order for observation to MedSurg for further evaluation management of the above  I have placed some additional preliminary admit orders via the adult multi-morbid admission order set. I have also ordered additional Rocephin.  Of ordered morning labs in the form of CMP, CBC, and serum magnesium level.  I have also ordered fall precautions and delirium precautions.    Newton Pigg, DO Hospitalist

## 2022-08-20 NOTE — H&P (Signed)
History and Physical    Patient: Amanda Cochran ZOX:[id number] DOB: 1929-02-11 DOA: 08/19/2022 DOS: the patient was seen and examined on 08/20/2022 PCP: [name], MD  Patient coming from: Home  Chief Complaint:  Chief Complaint  Patient presents with   Hallucinations   HPI: Amanda Cochran is a 89 y.o. female with medical history significant of osteoarthritis, depression, left CVA, glaucoma, hyperlipidemia, hypertension, left eye lacunar stroke, right hemiparesis, left basal ganglia infarct, hypothyroidism who was brought to the emergency department due to paranoid delusions.  She was screaming that there were people in her house.  One of her neighbors heard her scream and called EMS.  When seen, the patient was sleeping in NAD, but arousable.  She is unable to elaborate at the moment.  No complaints of pain or dyspnea at the moment.  ED course: Initial vital signs were temperature 97.7 F, pulse 93, respirations 16, BP 140/116 mmHg O2 sat 98% on room air.  The patient received ceftriaxone 1 g IVPB.  Lab work: UDS was negative.  Urine analysis with ketones of 5 and protein of 30 mg deciliter.  Positive nitrites, negative leukocyte esterase.  6-10 WBC and many bacteria with granular casts on microscopic examination.  CBC showed a white count 11.1, hemoglobin 13.5 g/dL and platelets 811.  Alcohol level was normal.  Imaging: CT head without contrast with no acute intracranial process.   Review of Systems: As mentioned in the history of present illness. All other systems reviewed and are negative. Past Medical History:  Diagnosis Date   Arthritis    Bladder infection    Blindness    CVA (cerebral infarction) 02/07/2014   Left basal ganglia infarct with right hemiparesis    Glaucoma    Glaucoma    Glaucoma (increased eye pressure)    Hyperlipidemia    Hypertension    Left sided lacunar stroke (HCC) 02/14/2014   Right hemiparesis (HCC) 02/09/2014   Left basal ganglia infarct Dec 2015     Stroke [facility])    Thyroid disease    Past Surgical History:  Procedure Laterality Date   cataract surgery Bilateral 2015   THYROID SURGERY  1976   TONSILLECTOMY AND ADENOIDECTOMY     VAGINAL PROLAPSE REPAIR     Social History:  reports that she has never smoked. She has never used smokeless tobacco. She reports that she does not drink alcohol and does not use drugs.  No Known Allergies  Family History  Problem Relation Age of Onset   Heart failure Mother    AAA (abdominal aortic aneurysm) Father     Prior to Admission medications   Medication Sig Start Date End Date Taking? Authorizing Provider  amLODipine (NORVASC) 5 MG tablet Take 1 tablet by mouth daily. 03/03/18  Yes [provider]  Ascorbic Acid (VITAMIN C PO) Take 1 tablet by mouth daily.    Yes [provider]  benazepril (LOTENSIN) 20 MG tablet Take 20 mg by mouth daily.   Yes [provider]  Cholecalciferol (VITAMIN D) 2000 UNITS CAPS Take 1 capsule by mouth daily.   Yes [provider]  ELIQUIS 5 MG TABS tablet Take 5 mg by mouth 2 (two) times daily. 01/16/19  Yes [provider]  latanoprost (XALATAN) 0.005 % ophthalmic solution Place 1 drop into both eyes at bedtime.    Yes [provider]  levothyroxine (SYNTHROID, LEVOTHROID) 100 MCG tablet Take 1 tablet (100 mcg total) by mouth daily. Patient taking differently: Take 100  mcg by mouth daily before breakfast. 02/19/14  Yes Angiulli, Mcarthur Rossetti, PA-C  Multiple Vitamins-Minerals (CENTRUM SILVER PO) Take 1 tablet by mouth daily.   Yes [provider]  Omega-3 Fatty Acids (FISH OIL) 1000 MG CAPS Take 1,000 mg by mouth daily.    Yes [provider]  pilocarpine (PILOCAR) 1 % ophthalmic solution Place 1 drop into both eyes 4 (four) times daily.  08/06/16  Yes [provider]  timolol (TIMOPTIC) 0.5 % ophthalmic solution Place 1 drop into both eyes daily. 03/18/22  Yes [provider]   atorvastatin (LIPITOR) 40 MG tablet Take 1 tablet (40 mg total) by mouth daily at 6 PM. Patient not taking: Reported on 08/19/2022 01/14/19 01/15/29  Jerald Kief, MD  hydrALAZINE (APRESOLINE) 25 MG tablet Take 1 tablet (25 mg total) by mouth every 8 (eight) hours. Patient not taking: Reported on 08/19/2022 02/19/14   Charlton Amor, PA-C    Physical Exam: Vitals:   08/19/22 1835 08/19/22 2235 08/20/22 0503 08/20/22 0740  BP:  (!) 167/111 (!) 142/90 (!) 147/71  Pulse:  (!) 102 84   Resp:  19 18   Temp:  (!) 97.5 F (36.4 C) 97.8 F (36.6 C)   TempSrc:  Oral Oral   SpO2:  94% 97%   Weight: 70 kg     Height: 5\' 5"  (1.651 m)      Physical Exam Vitals and nursing note reviewed.  Constitutional:      General: She is awake. She is not in acute distress.    Appearance: Normal appearance.  HENT:     Head: Normocephalic.     Nose: No rhinorrhea.     Mouth/Throat:     Mouth: Mucous membranes are dry.  Eyes:     General: No scleral icterus.    Pupils: Pupils are equal, round, and reactive to light.  Neck:     Vascular: No JVD.  Cardiovascular:     Rate and Rhythm: Normal rate and regular rhythm.     Heart sounds: S1 normal and S2 normal.  Pulmonary:     Effort: Pulmonary effort is normal.     Breath sounds: Normal breath sounds. No wheezing, rhonchi or rales.  Abdominal:     General: Bowel sounds are normal.     Palpations: Abdomen is soft.     Tenderness: There is no abdominal tenderness.  Musculoskeletal:     Cervical back: Neck supple.     Right lower leg: No edema.     Left lower leg: No edema.  Skin:    General: Skin is warm and dry.  Neurological:     General: No focal deficit present.     Mental Status: She is disoriented.  Psychiatric:        Behavior: Behavior is cooperative.        Cognition and Memory: Cognition is impaired. She exhibits impaired recent memory.     Data Reviewed:  Results are pending, will review when available.  01/21/2019  transthoracic echocardiogram IMPRESSIONS:   1. Left ventricular ejection fraction, by visual estimation, is 50 to  55%. The left ventricle has low normal function. There is mildly increased  left ventricular hypertrophy.   2. Left ventricular diastolic function could not be evaluated.   3. The left ventricle demonstrates regional wall motion abnormalities.   4. Abnormal septal motion with hypokinesis of basal inferoseptal/inferior  wall.   5. Global right ventricle has normal systolic function.The right  ventricular size is  normal. No increase in right ventricular wall  thickness.   6. Left atrial size was moderately dilated.   7. Right atrial size was mildly dilated.   8. The mitral valve is grossly normal. No evidence of mitral valve  regurgitation. No evidence of mitral stenosis.   9. The tricuspid valve is normal in structure. Tricuspid valve  regurgitation is mild.  10. The aortic valve is tricuspid. Aortic valve regurgitation is trivial.  No evidence of aortic valve sclerosis or stenosis.  11. The pulmonic valve was not well visualized. Pulmonic valve  regurgitation is not visualized.  12. Normal pulmonary artery systolic pressure.  13. The inferior vena cava is normal in size with greater than 50%  respiratory variability, suggesting right atrial pressure of 3 mmHg.   Transthoracic echo today IMPRESSIONS:   1. Wall motion difficult to assess due to incomplete visualization of the  LV endocardium but appears globally hypokinetic on limited view. Left  ventricular ejection fraction, by estimation, is 30 to 35%. The left  ventricle has moderately decreased  function. The left ventricular internal cavity size was mildly dilated.  Left ventricular diastolic parameters are indeterminate.   2. Right ventricular systolic function is moderately reduced. The right  ventricular size is mildly enlarged. There is mildly elevated pulmonary  artery systolic pressure. The estimated right  ventricular systolic  pressure is 39.2 mmHg.   3. Left atrial size was severely dilated.   4. Right atrial size was severely dilated.   5. The mitral valve is grossly normal. Moderate mitral valve  regurgitation.   6. The aortic valve is tricuspid. There is mild calcification of the  aortic valve. There is mild thickening of the aortic valve. Aortic valve  regurgitation is trivial. Aortic valve sclerosis/calcification is present,  without any evidence of aortic  stenosis.   7. The inferior vena cava is dilated in size with <50% respiratory  variability, suggesting right atrial pressure of 15 mmHg.   EKG: Vent. rate 80 BPM PR interval * ms QRS duration 108 ms QT/QTcB 378/435 ms P-R-T axes * 99 36 Atrial fibrillation with premature ventricular or aberrantly conducted complexes Rightward axis Cannot rule out Anterior infarct , age undetermined Abnormal ECG No previous ECGs available  Assessment and Plan: Principal Problem:   Acute metabolic encephalopathy No clear etiology. Frequent neurochecks. Supportive care. TOC evaluation appreciated. Behavioral health consult appreciated. Behavioral health lab recommendations ordered.  Active Problems:   Essential hypertension Continue amlodipine 5 mg p.o. daily.   Continue benazepril 20 mg p.o. daily. Continue hydralazine 25 mg p.o. every 8 hours.    Hyperlipidemia Continue atorvastatin 40 mg p.o. daily.  History of:   Left sided lacunar stroke (HCC) Associated with:   Right hemiparesis (HCC) And:   MCI (mild cognitive impairment) Supportive care. Considered PT evaluation as needed.    Atrial fibrillation (HCC) CHA2DS2-VASc Score of 8. Continue apixaban 5 mg p.o. twice daily. Check echocardiogram. Echo today showing systolic heart failure with EF of 30 to 35%. Patient had a transient episode of tachycardia. Will hold hydralazine and begin metoprolol succinate. Continue benazepril 20 mg p.o. daily.     Glaucoma Continue Cosopt, Xalatan and pilocarpine drops. Follow-up with ophthalmology as an outpatient.     Advance Care Planning:   Code Status: Full Code   Consults: Behavioral health and TOC team.  Family Communication:   Severity of Illness: The appropriate patient status for this patient is OBSERVATION. Observation status is judged to be reasonable and necessary in order  to provide the required intensity of service to ensure the patient's safety. The patient's presenting symptoms, physical exam findings, and initial radiographic and laboratory data in the context of their medical condition is felt to place them at decreased risk for further clinical deterioration. Furthermore, it is anticipated that the patient will be medically stable for discharge from the hospital within 2 midnights of admission.   Author: Bobette Mo, MD 08/20/2022 8:10 AM  For on call review [email or web address].   This document was prepared using Dragon voice recognition software and may contain some unintended transcription errors.

## 2022-08-20 NOTE — Progress Notes (Signed)
[facility] Progress Note  08/20/2022 11:42 AM [facility]  MRN:  [id number]   Principal Problem: Acute metabolic encephalopathy Diagnosis:  Principal Problem:   Acute metabolic encephalopathy Active Problems:   Disoriented    Subjective:  On evaluation today, the patient is lying in her bed, with eyes open, looking for her walking cane. She is calm and cooperative during this assessment. Her appearance is appropriate for environment. Her eye contact is good, she repeatedly states that she is by, and upon chart review patient is legally blind.  Speech is clear and coherent, normal pace and increased volume. She reports that she is doing fine, patient's mood seems to be euthymic.  Affect is congruent with mood.  Thought process is disorganized.  Thought content is scattered.  She states the reason that she is here, in the hospital because she is about to have surgery for cancer on her buttocks.  Patient is alert and oriented to self, and environment.  She denies auditory and visual hallucinations.  No indication that she is responding to internal stimuli during this assessment.  No delusions elicited during this assessment. She denies suicidal ideations.  She denies homicidal ideations.  Patient has breakfast tray is on her bedside table and it appears that patient has eaten 75% of breakfast.  She states that her sleep is fair.  Patient denies any past history of mental illness, and denies any inpatient psychiatric hospitalizations.  She states that she lives by herself, says "me myself and I" when asked who she was with.  She states that she has a brother who lives far away, and a niece who lives in [place].  Patient denies appear a bit confused, and paranoid, she feels that people want to take her home from her.   Spoke with patient's niece Gigi Gin, she states that she is not biologically related to the patient, patient married [name]'s mother's brother, and he passed away about 10 years ago and  Gigi Gin has since then stepped in to help patient.  She states patient has no children, but has a nephew Thayer Ohm who is supposedly her power of attorney.  She states that she has not seen patient since October 2023 due to her own personal health issues, is not sure when the patient's nephew Thayer Ohm seen her because they had a falling out due to the patient's paranoia thinking that Thayer Ohm and other people were out to get her and take her home from her.  Gigi Gin states that she lives in [place] which is about 5 hours away from patient.  She states patient has never been diagnosed with dementia or any other psychiatric diagnoses that she is aware of.  She states patient does have a brother who lives in [place] [facility], and he is in poorer health the patient.  She states that [employer] and [employer], are patient's neighbors who assist her with doctor's visits and groceries.  She states that Susy Frizzle is a Midwife and their numbers are [phone] for Susy Frizzle and Brittney's number is [phone].  Attempted to call patient's nephew [name], no answer left a HIPAA compliant voicemail.  Patient has been admitted to the hospital for acute metabolic encephalopathy at 1100.   ED Assessment Time Calculation: Start Time: 1000 Stop Time: 1020 Total Time in Minutes (Assessment Completion): 20   Past Psychiatric History: None per patient or family, or chart review.  [place] Scale:  Flowsheet Row ED to Hosp-Admission (Current) from 08/19/2022 in [facility]  ONCOLOGY  C-SSRS RISK CATEGORY No Risk       Past Medical History:  Past Medical History:  Diagnosis Date   Arthritis    Bladder infection    Blindness    CVA (cerebral infarction) 02/07/2014   Left basal ganglia infarct with right hemiparesis    Glaucoma    Glaucoma    Glaucoma (increased eye pressure)    Hyperlipidemia    Hypertension    Left sided lacunar stroke (HCC) 02/14/2014   Right hemiparesis (HCC) 02/09/2014   Left  basal ganglia infarct Dec 2015    Stroke [facility])    Thyroid disease     Past Surgical History:  Procedure Laterality Date   cataract surgery Bilateral 2015   THYROID SURGERY  1976   TONSILLECTOMY AND ADENOIDECTOMY     VAGINAL PROLAPSE REPAIR     Family History:  Family History  Problem Relation Age of Onset   Heart failure Mother    AAA (abdominal aortic aneurysm) Father    Social History:  Social History   Substance and Sexual Activity  Alcohol Use No     Social History   Substance and Sexual Activity  Drug Use No    Social History   Socioeconomic History   Marital status: Widowed    Spouse name: deceased   Number of children: 0   Years of education: 14   Highest education level: Not on file  Occupational History   Occupation: retired    Comment: Print production planner  Tobacco Use   Smoking status: Never   Smokeless tobacco: Never  Vaping Use   Vaping Use: Never used  Substance and Sexual Activity   Alcohol use: No   Drug use: No   Sexual activity: Not on file  Other Topics Concern   Not on file  Social History Narrative   Widowed, lives alone   Caffeine use- 1 cup coffee daily   Right handed   No children   Social Determinants of Health   Financial Resource Strain: Not on file  Food Insecurity: No Food Insecurity (08/20/2022)   Hunger Vital Sign    Worried About Running Out of Food in the Last Year: Never true    Ran Out of Food in the Last Year: Never true  Transportation Needs: Unmet Transportation Needs (08/20/2022)   PRAPARE - Administrator, Civil Service (Medical): Yes    Lack of Transportation (Non-Medical): Yes  Physical Activity: Not on file  Stress: Not on file  Social Connections: Not on file    Sleep: Fair  Appetite:  Fair  Current Medications: Current Facility-Administered Medications  Medication Dose Route Frequency Provider Last Rate Last Admin   acetaminophen (TYLENOL) tablet 650 mg  650 mg Oral Q6H PRN Howerter, Justin  B, DO       Or   acetaminophen (TYLENOL) suppository 650 mg  650 mg Rectal Q6H PRN Howerter, Justin B, DO       amLODipine (NORVASC) tablet 5 mg  5 mg Oral Daily Linwood Dibbles, MD       apixaban Everlene Balls) tablet 5 mg  5 mg Oral BID Linwood Dibbles, MD   5 mg at 08/20/22 0015   benazepril (LOTENSIN) tablet 20 mg  20 mg Oral Daily Linwood Dibbles, MD       [START ON 08/21/2022] cefTRIAXone (ROCEPHIN) 1 g in sodium chloride 0.9 % 100 mL IVPB  1 g Intravenous Q24H Howerter, Justin B, DO       cholecalciferol (VITAMIN  D3) 25 MCG (1000 UNIT) tablet 2,000 Units  2,000 Units Oral Daily Linwood Dibbles, MD       dorzolamide-timolol (COSOPT) 2-0.5 % ophthalmic solution 1 drop  1 drop Both Eyes BID Linwood Dibbles, MD       hydrALAZINE (APRESOLINE) tablet 25 mg  25 mg Oral Q8H Linwood Dibbles, MD   25 mg at 08/20/22 0740   latanoprost (XALATAN) 0.005 % ophthalmic solution 1 drop  1 drop Both Eyes QHS Linwood Dibbles, MD       levothyroxine (SYNTHROID) tablet 100 mcg  100 mcg Oral Q0600 Linwood Dibbles, MD   100 mcg at 08/20/22 0740   melatonin tablet 3 mg  3 mg Oral QHS PRN Howerter, Justin B, DO       ondansetron (ZOFRAN) injection 4 mg  4 mg Intravenous Q6H PRN Howerter, Justin B, DO       pilocarpine (PILOCAR) 1 % ophthalmic solution 1 drop  1 drop Both Eyes QID Linwood Dibbles, MD       potassium chloride SA (KLOR-CON M) CR tablet 40 mEq  40 mEq Oral Once Bobette Mo, MD        Lab Results:  Results for orders placed or performed during the hospital encounter of 08/19/22 (from the past 48 hour(s))  Comprehensive metabolic panel     Status: Abnormal   Collection Time: 08/19/22  8:05 PM  Result Value Ref Range   Sodium 141 135 - 145 mmol/L   Potassium 3.6 3.5 - 5.1 mmol/L   Chloride 104 98 - 111 mmol/L   CO2 26 22 - 32 mmol/L   Glucose, Bld 101 (H) 70 - 99 mg/dL    Comment: Glucose reference range applies only to samples taken after fasting for at least 8 hours.   BUN 31 (H) 8 - 23 mg/dL   Creatinine, Ser 1.02 (H) 0.44 - 1.00  mg/dL   Calcium 9.6 8.9 - 72.5 mg/dL   Total Protein 7.0 6.5 - 8.1 g/dL   Albumin 3.8 3.5 - 5.0 g/dL   AST 37 15 - 41 U/L   ALT 25 0 - 44 U/L   Alkaline Phosphatase 46 38 - 126 U/L   Total Bilirubin 1.3 (H) 0.3 - 1.2 mg/dL   GFR, Estimated 38 (L) >60 mL/min    Comment: (NOTE) Calculated using the CKD-EPI Creatinine Equation (2021)    Anion gap 11 5 - 15    Comment: Performed at [facility], [address], [place], [place] [address]  Ethanol     Status: None   Collection Time: 08/19/22  8:05 PM  Result Value Ref Range   Alcohol, Ethyl (B) <10 <10 mg/dL    Comment: (NOTE) Lowest detectable limit for serum alcohol is 10 mg/dL.  For medical purposes only. Performed at [facility], [address] [place], [place] [address]   CBC with Diff     Status: Abnormal   Collection Time: 08/19/22  8:05 PM  Result Value Ref Range   WBC 11.1 (H) 4.0 - 10.5 K/uL   RBC 4.70 3.87 - 5.11 MIL/uL   Hemoglobin 13.5 12.0 - 15.0 g/dL   HCT 25.9 56.3 - 87.5 %   MCV 91.7 80.0 - 100.0 fL   MCH 28.7 26.0 - 34.0 pg   MCHC 31.3 30.0 - 36.0 g/dL   RDW 64.3 (H) 32.9 - 51.8 %   Platelets 181 150 - 400 K/uL   nRBC 0.0 0.0 - 0.2 %  Neutrophils Relative % 75 %   Neutro Abs 8.4 (H) 1.7 - 7.7 K/uL   Lymphocytes Relative 16 %   Lymphs Abs 1.7 0.7 - 4.0 K/uL   Monocytes Relative 8 %   Monocytes Absolute 0.9 0.1 - 1.0 K/uL   Eosinophils Relative 0 %   Eosinophils Absolute 0.0 0.0 - 0.5 K/uL   Basophils Relative 1 %   Basophils Absolute 0.1 0.0 - 0.1 K/uL   Immature Granulocytes 0 %   Abs Immature Granulocytes 0.03 0.00 - 0.07 K/uL    Comment: Performed at [facility], [address], [place], [place] [address]  Urine rapid drug screen (hosp performed)     Status: None   Collection Time: 08/20/22  4:40 AM  Result Value Ref Range   Opiates NONE DETECTED NONE DETECTED   Cocaine NONE DETECTED NONE DETECTED   Benzodiazepines NONE DETECTED NONE  DETECTED   Amphetamines NONE DETECTED NONE DETECTED   Tetrahydrocannabinol NONE DETECTED NONE DETECTED   Barbiturates NONE DETECTED NONE DETECTED    Comment: (NOTE) DRUG SCREEN FOR MEDICAL PURPOSES ONLY.  IF CONFIRMATION IS NEEDED FOR ANY PURPOSE, NOTIFY LAB WITHIN 5 DAYS.  LOWEST DETECTABLE LIMITS FOR URINE DRUG SCREEN Drug Class                     Cutoff (ng/mL) Amphetamine and metabolites    1000 Barbiturate and metabolites    200 Benzodiazepine                 200 Opiates and metabolites        300 Cocaine and metabolites        300 THC                            50 Performed at [facility], [address] [place], [place] [address]   Urinalysis, Routine w reflex microscopic -Urine, Clean Catch     Status: Abnormal   Collection Time: 08/20/22  4:40 AM  Result Value Ref Range   Color, Urine YELLOW YELLOW   APPearance CLEAR CLEAR   Specific Gravity, Urine 1.023 1.005 - 1.030   pH 5.0 5.0 - 8.0   Glucose, UA NEGATIVE NEGATIVE mg/dL   Hgb urine dipstick NEGATIVE NEGATIVE   Bilirubin Urine NEGATIVE NEGATIVE   Ketones, ur 5 (A) NEGATIVE mg/dL   Protein, ur 30 (A) NEGATIVE mg/dL   Nitrite POSITIVE (A) NEGATIVE   Leukocytes,Ua NEGATIVE NEGATIVE   RBC / HPF 0-5 0 - 5 RBC/hpf   WBC, UA 6-10 0 - 5 WBC/hpf   Bacteria, UA MANY (A) NONE SEEN   Squamous Epithelial / HPF 0-5 0 - 5 /HPF   Mucus PRESENT    Granular Casts, UA PRESENT     Comment: Performed at [facility], [address], [place], [place] [address]  CBC with Differential/Platelet     Status: Abnormal   Collection Time: 08/20/22  7:39 AM  Result Value Ref Range   WBC 9.3 4.0 - 10.5 K/uL   RBC 4.77 3.87 - 5.11 MIL/uL   Hemoglobin 14.0 12.0 - 15.0 g/dL   HCT 28.4 13.2 - 44.0 %   MCV 91.4 80.0 - 100.0 fL   MCH 29.4 26.0 - 34.0 pg   MCHC 32.1 30.0 - 36.0 g/dL   RDW 10.2 (H) 72.5 - 36.6 %   Platelets 195 150 - 400 K/uL   nRBC 0.0 0.0 -  0.2 %   Neutrophils Relative % 75 %    Neutro Abs 7.0 1.7 - 7.7 K/uL   Lymphocytes Relative 17 %   Lymphs Abs 1.6 0.7 - 4.0 K/uL   Monocytes Relative 6 %   Monocytes Absolute 0.5 0.1 - 1.0 K/uL   Eosinophils Relative 1 %   Eosinophils Absolute 0.1 0.0 - 0.5 K/uL   Basophils Relative 1 %   Basophils Absolute 0.1 0.0 - 0.1 K/uL   Immature Granulocytes 0 %   Abs Immature Granulocytes 0.03 0.00 - 0.07 K/uL    Comment: Performed at [facility], [address] [place], [place] [address]  Comprehensive metabolic panel     Status: Abnormal   Collection Time: 08/20/22  7:39 AM  Result Value Ref Range   Sodium 139 135 - 145 mmol/L   Potassium 3.4 (L) 3.5 - 5.1 mmol/L   Chloride 105 98 - 111 mmol/L   CO2 24 22 - 32 mmol/L   Glucose, Bld 109 (H) 70 - 99 mg/dL    Comment: Glucose reference range applies only to samples taken after fasting for at least 8 hours.   BUN 29 (H) 8 - 23 mg/dL   Creatinine, Ser 6.64 (H) 0.44 - 1.00 mg/dL   Calcium 9.0 8.9 - 40.3 mg/dL   Total Protein 6.6 6.5 - 8.1 g/dL   Albumin 3.6 3.5 - 5.0 g/dL   AST 34 15 - 41 U/L   ALT 22 0 - 44 U/L   Alkaline Phosphatase 46 38 - 126 U/L   Total Bilirubin 1.2 0.3 - 1.2 mg/dL   GFR, Estimated 43 (L) >60 mL/min    Comment: (NOTE) Calculated using the CKD-EPI Creatinine Equation (2021)    Anion gap 10 5 - 15    Comment: Performed at [facility], [address], [place], [place] [address]  Magnesium     Status: None   Collection Time: 08/20/22  7:39 AM  Result Value Ref Range   Magnesium 1.9 1.7 - 2.4 mg/dL    Comment: Performed at [facility], [address] [place], [place] [address]    Blood Alcohol level:  Lab Results  Component Value Date   [facility] <10 08/19/2022   ETH <10 01/12/2019    Physical Findings:  CIWA:    COWS:     Musculoskeletal:  Observed patient resting in bed.  Psychiatric Specialty Exam:  Presentation  General Appearance:  Appropriate for Environment  Eye  Contact: Fleeting  Speech: Clear and Coherent  Speech Volume: Increased  Handedness:No data recorded  Mood and Affect  Mood: Euthymic  Affect: Appropriate   Thought Process  Thought Processes: Disorganized  Descriptions of Associations:Loose  Orientation:Partial  Thought Content:Scattered  History of Schizophrenia/Schizoaffective disorder:No data recorded Duration of Psychotic Symptoms:No data recorded Hallucinations:Hallucinations: None  Ideas of Reference:Paranoia  Suicidal Thoughts:Suicidal Thoughts: No  Homicidal Thoughts:Homicidal Thoughts: No   Sensorium  Memory: Immediate Poor; Recent Fair; Remote Fair  Judgment: Impaired  Insight: Fair   Chartered certified accountant: Fair  Attention Span: Fair  Recall: Poor  Fund of Knowledge: Poor  Language: Fair   Psychomotor Activity  Psychomotor Activity: Psychomotor Activity: Normal   Assets  Assets: Manufacturing systems engineer; Social Support; Housing   Sleep  Sleep: Sleep: Fair    Physical Exam: Physical Exam Vitals and nursing note reviewed. Exam conducted with a chaperone present.  Neurological:     Mental Status: She is alert.  Psychiatric:  Attention and Perception: Attention normal.        Mood and Affect: Mood normal.        Speech: Speech normal.        Behavior: Behavior is cooperative.        Thought Content: Thought content is paranoid.        Cognition and Memory: Cognition is impaired.        Judgment: Judgment normal.    Review of Systems  Constitutional: Negative.   Psychiatric/Behavioral: Negative.     Blood pressure (!) 153/82, pulse 91, temperature 97.8 F (36.6 C), temperature source Oral, resp. rate 20, height 5\' 5"  (1.651 m), weight 62.1 kg, SpO2 98 %. Body mass index is 22.78 kg/m.    Medical Decision Making: Patient is psychiatrically cleared. Patient case review and discussed with Dr. Lucianne Muss, and patient does not meet inpatient  criteria for inpatient psychiatric treatment. Patient denies SI, HI, AVH.  I agree with the determination of the previous psychiatric provider, " and do not feel that there is any underlying psychiatric issues contributing to her new onset confusion and hallucinations, but rather there is suspicion for metabolic source of these findings, namely the aforementioned urinary tract infection".       MOTLEY-MANGRUM, PMHNP 08/20/2022, 11:42 AM

## 2022-08-20 NOTE — ED Notes (Signed)
ED TO INPATIENT HANDOFF REPORT  Name/Age/Gender Amanda Cochran 89 y.o. female  Code Status    Code Status Orders  (From admission, onward)           Start     Ordered   08/20/22 0644  Full code  Continuous       Question:  By:  Answer:  Consent: discussion documented in EHR   08/20/22 0644           Code Status History     Date Active Date Inactive Code Status Order ID Comments User Context   01/13/2019 0516 01/14/2019 1851 Full Code [id number]  Eduard Clos, MD ED   02/07/2014 1440 02/19/2014 1504 Full Code [id number]  Charlton Amor, PA-C Inpatient   02/03/2014 7829 02/07/2014 1440 Full Code [id number]  Marinda Elk, MD Inpatient       Home/SNF/Other Home  Chief Complaint Acute metabolic encephalopathy [G93.41]  Level of Care/Admitting Diagnosis ED Disposition     ED Disposition  Admit   Condition  --   Comment  Hospital Area: [facility] [[id number]]  Level of Care: Med-Surg [16]  May place patient in observation at [facility] or [facility] if equivalent level of care is available:: No  Covid Evaluation: Asymptomatic - no recent exposure (last 10 days) testing not required  Diagnosis: Acute metabolic encephalopathy [7846962]  Admitting Physician: Angie Fava [[id number]]  Attending Physician: Angie Fava [[id number]]          Medical History Past Medical History:  Diagnosis Date   Arthritis    Bladder infection    Blindness    CVA (cerebral infarction) 02/07/2014   Left basal ganglia infarct with right hemiparesis    Glaucoma    Glaucoma    Glaucoma (increased eye pressure)    Hyperlipidemia    Hypertension    Left sided lacunar stroke (HCC) 02/14/2014   Right hemiparesis (HCC) 02/09/2014   Left basal ganglia infarct Dec 2015    Stroke [facility])    Thyroid disease     Allergies No Known Allergies  IV Location/Drains/Wounds Patient Lines/Drains/Airways Status     Active Line/Drains/Airways      Name Placement date Placement time Site Days   Peripheral IV 08/20/22 22 G 1" Anterior;Left Forearm 08/20/22  0738  Forearm  less than 1            Labs/Imaging Results for orders placed or performed during the hospital encounter of 08/19/22 (from the past 48 hour(s))  Comprehensive metabolic panel     Status: Abnormal   Collection Time: 08/19/22  8:05 PM  Result Value Ref Range   Sodium 141 135 - 145 mmol/L   Potassium 3.6 3.5 - 5.1 mmol/L   Chloride 104 98 - 111 mmol/L   CO2 26 22 - 32 mmol/L   Glucose, Bld 101 (H) 70 - 99 mg/dL    Comment: Glucose reference range applies only to samples taken after fasting for at least 8 hours.   BUN 31 (H) 8 - 23 mg/dL   Creatinine, Ser 7.25 (H) 0.44 - 1.00 mg/dL   Calcium 9.6 8.9 - 36.6 mg/dL   Total Protein 7.0 6.5 - 8.1 g/dL   Albumin 3.8 3.5 - 5.0 g/dL   AST 37 15 - 41 U/L   ALT 25 0 - 44 U/L   Alkaline Phosphatase 46 38 - 126 U/L   Total Bilirubin 1.3 (H) 0.3 - 1.2 mg/dL   GFR, Estimated 38 (  L) >60 mL/min    Comment: (NOTE) Calculated using the CKD-EPI Creatinine Equation (2021)    Anion gap 11 5 - 15    Comment: Performed at [facility], [address], [place], [place] [address]  Ethanol     Status: None   Collection Time: 08/19/22  8:05 PM  Result Value Ref Range   Alcohol, Ethyl (B) <10 <10 mg/dL    Comment: (NOTE) Lowest detectable limit for serum alcohol is 10 mg/dL.  For medical purposes only. Performed at [facility], [address] [place], [place] [address]   CBC with Diff     Status: Abnormal   Collection Time: 08/19/22  8:05 PM  Result Value Ref Range   WBC 11.1 (H) 4.0 - 10.5 K/uL   RBC 4.70 3.87 - 5.11 MIL/uL   Hemoglobin 13.5 12.0 - 15.0 g/dL   HCT 09.8 11.9 - 14.7 %   MCV 91.7 80.0 - 100.0 fL   MCH 28.7 26.0 - 34.0 pg   MCHC 31.3 30.0 - 36.0 g/dL   RDW 82.9 (H) 56.2 - 13.0 %   Platelets 181 150 - 400 K/uL   nRBC 0.0 0.0 - 0.2 %   Neutrophils Relative % 75  %   Neutro Abs 8.4 (H) 1.7 - 7.7 K/uL   Lymphocytes Relative 16 %   Lymphs Abs 1.7 0.7 - 4.0 K/uL   Monocytes Relative 8 %   Monocytes Absolute 0.9 0.1 - 1.0 K/uL   Eosinophils Relative 0 %   Eosinophils Absolute 0.0 0.0 - 0.5 K/uL   Basophils Relative 1 %   Basophils Absolute 0.1 0.0 - 0.1 K/uL   Immature Granulocytes 0 %   Abs Immature Granulocytes 0.03 0.00 - 0.07 K/uL    Comment: Performed at [facility], [address] [place], [place] [address]  Urine rapid drug screen (hosp performed)     Status: None   Collection Time: 08/20/22  4:40 AM  Result Value Ref Range   Opiates NONE DETECTED NONE DETECTED   Cocaine NONE DETECTED NONE DETECTED   Benzodiazepines NONE DETECTED NONE DETECTED   Amphetamines NONE DETECTED NONE DETECTED   Tetrahydrocannabinol NONE DETECTED NONE DETECTED   Barbiturates NONE DETECTED NONE DETECTED    Comment: (NOTE) DRUG SCREEN FOR MEDICAL PURPOSES ONLY.  IF CONFIRMATION IS NEEDED FOR ANY PURPOSE, NOTIFY LAB WITHIN 5 DAYS.  LOWEST DETECTABLE LIMITS FOR URINE DRUG SCREEN Drug Class                     Cutoff (ng/mL) Amphetamine and metabolites    1000 Barbiturate and metabolites    200 Benzodiazepine                 200 Opiates and metabolites        300 Cocaine and metabolites        300 THC                            50 Performed at [facility], [address] [place], [place] [address]   Urinalysis, Routine w reflex microscopic -Urine, Clean Catch     Status: Abnormal   Collection Time: 08/20/22  4:40 AM  Result Value Ref Range   Color, Urine YELLOW YELLOW   APPearance CLEAR CLEAR   Specific Gravity, Urine 1.023 1.005 - 1.030   pH 5.0 5.0 - 8.0   Glucose, UA NEGATIVE  NEGATIVE mg/dL   Hgb urine dipstick NEGATIVE NEGATIVE   Bilirubin Urine NEGATIVE NEGATIVE   Ketones, ur 5 (A) NEGATIVE mg/dL   Protein, ur 30 (A) NEGATIVE mg/dL   Nitrite POSITIVE (A) NEGATIVE   Leukocytes,Ua NEGATIVE NEGATIVE   RBC  / HPF 0-5 0 - 5 RBC/hpf   WBC, UA 6-10 0 - 5 WBC/hpf   Bacteria, UA MANY (A) NONE SEEN   Squamous Epithelial / HPF 0-5 0 - 5 /HPF   Mucus PRESENT    Granular Casts, UA PRESENT     Comment: Performed at [facility], [address], [place], [place] [address]  CBC with Differential/Platelet     Status: Abnormal   Collection Time: 08/20/22  7:39 AM  Result Value Ref Range   WBC 9.3 4.0 - 10.5 K/uL   RBC 4.77 3.87 - 5.11 MIL/uL   Hemoglobin 14.0 12.0 - 15.0 g/dL   HCT 57.8 46.9 - 62.9 %   MCV 91.4 80.0 - 100.0 fL   MCH 29.4 26.0 - 34.0 pg   MCHC 32.1 30.0 - 36.0 g/dL   RDW 52.8 (H) 41.3 - 24.4 %   Platelets 195 150 - 400 K/uL   nRBC 0.0 0.0 - 0.2 %   Neutrophils Relative % 75 %   Neutro Abs 7.0 1.7 - 7.7 K/uL   Lymphocytes Relative 17 %   Lymphs Abs 1.6 0.7 - 4.0 K/uL   Monocytes Relative 6 %   Monocytes Absolute 0.5 0.1 - 1.0 K/uL   Eosinophils Relative 1 %   Eosinophils Absolute 0.1 0.0 - 0.5 K/uL   Basophils Relative 1 %   Basophils Absolute 0.1 0.0 - 0.1 K/uL   Immature Granulocytes 0 %   Abs Immature Granulocytes 0.03 0.00 - 0.07 K/uL    Comment: Performed at [facility], [address], [place], [place] [address]  Comprehensive metabolic panel     Status: Abnormal   Collection Time: 08/20/22  7:39 AM  Result Value Ref Range   Sodium 139 135 - 145 mmol/L   Potassium 3.4 (L) 3.5 - 5.1 mmol/L   Chloride 105 98 - 111 mmol/L   CO2 24 22 - 32 mmol/L   Glucose, Bld 109 (H) 70 - 99 mg/dL    Comment: Glucose reference range applies only to samples taken after fasting for at least 8 hours.   BUN 29 (H) 8 - 23 mg/dL   Creatinine, Ser 2.53 (H) 0.44 - 1.00 mg/dL   Calcium 9.0 8.9 - 66.4 mg/dL   Total Protein 6.6 6.5 - 8.1 g/dL   Albumin 3.6 3.5 - 5.0 g/dL   AST 34 15 - 41 U/L   ALT 22 0 - 44 U/L   Alkaline Phosphatase 46 38 - 126 U/L   Total Bilirubin 1.2 0.3 - 1.2 mg/dL   GFR, Estimated 43 (L) >60 mL/min    Comment: (NOTE) Calculated  using the CKD-EPI Creatinine Equation (2021)    Anion gap 10 5 - 15    Comment: Performed at [facility], [address], [place], [place] [address]  Magnesium     Status: None   Collection Time: 08/20/22  7:39 AM  Result Value Ref Range   Magnesium 1.9 1.7 - 2.4 mg/dL    Comment: Performed at [facility], [address] [place], [place] [address]   CT Head Wo Contrast  Result Date: 08/19/2022 CLINICAL DATA:  Mental status change EXAM: CT HEAD WITHOUT CONTRAST  TECHNIQUE: Contiguous axial images were obtained from the base of the skull through the vertex without intravenous contrast. RADIATION DOSE REDUCTION: This exam was performed according to the departmental dose-optimization program which includes automated exposure control, adjustment of the mA and/or kV according to patient size and/or use of iterative reconstruction technique. COMPARISON:  01/12/2019 CT head FINDINGS: Brain: No evidence of acute infarction, hemorrhage, mass, mass effect, or midline shift. No hydrocephalus or extra-axial fluid collection. Redemonstrated remote right parietal and left basal ganglia infarcts. Periventricular white matter changes, likely the sequela of chronic small vessel ischemic disease. Vascular: No hyperdense vessel. Atherosclerotic calcifications in the intracranial carotid and vertebral arteries. Skull: Negative for fracture or focal lesion. Sinuses/Orbits: Mucosal thickening in the ethmoid air cells. Status post bilateral lens replacements. Other: None. IMPRESSION: No acute intracranial process. Electronically Signed   By: Wiliam Ke M.D.   On: 08/19/2022 19:28    Pending Labs Unresulted Labs (From admission, onward)     Start     Ordered   08/20/22 7425  HIV Antibody (routine testing w rflx)  (HIV Antibody (Routine testing w reflex) panel)  Once,   R        08/20/22 0821   08/20/22 0820  Vitamin B12  Add-on,   AD        08/20/22 0821   08/20/22 0820   VITAMIN D 25 Hydroxy (Vit-D Deficiency, Fractures)  Add-on,   AD        08/20/22 0821   08/20/22 0820  TSH  Add-on,   AD        08/20/22 0821   08/20/22 0820  RPR  Add-on,   AD        08/20/22 0821   08/20/22 0820  Vitamin B1  Add-on,   AD        08/20/22 0821   08/20/22 0820  Ammonia  Once,   R        08/20/22 0821   08/20/22 0558  Urine Culture  Once,   URGENT       Question:  Indication  Answer:  Altered mental status (if no other cause identified)   08/20/22 0557            Vitals/Pain Today's Vitals   08/19/22 1835 08/19/22 2235 08/20/22 0503 08/20/22 0740  BP:  (!) 167/111 (!) 142/90 (!) 147/71  Pulse:  (!) 102 84   Resp:  19 18   Temp:  (!) 97.5 F (36.4 C) 97.8 F (36.6 C)   TempSrc:  Oral Oral   SpO2:  94% 97%   Weight: 70 kg     Height: 5\' 5"  (1.651 m)     PainSc: 0-No pain       Isolation Precautions No active isolations  Medications Medications  amLODipine (NORVASC) tablet 5 mg (has no administration in time range)  benazepril (LOTENSIN) tablet 20 mg (has no administration in time range)  cholecalciferol (VITAMIN D3) 25 MCG (1000 UNIT) tablet 2,000 Units (has no administration in time range)  dorzolamide-timolol (COSOPT) 2-0.5 % ophthalmic solution 1 drop (1 drop Both Eyes Not Given 08/20/22 0635)  apixaban (ELIQUIS) tablet 5 mg (5 mg Oral Given 08/20/22 0015)  hydrALAZINE (APRESOLINE) tablet 25 mg (25 mg Oral Given 08/20/22 0740)  latanoprost (XALATAN) 0.005 % ophthalmic solution 1 drop (1 drop Both Eyes Not Given 08/20/22 0635)  pilocarpine (PILOCAR) 1 % ophthalmic solution 1 drop (1 drop Both Eyes Not Given 08/20/22 0635)  levothyroxine (SYNTHROID) tablet 100 mcg (100 mcg  Oral Given 08/20/22 0740)  acetaminophen (TYLENOL) tablet 650 mg (has no administration in time range)    Or  acetaminophen (TYLENOL) suppository 650 mg (has no administration in time range)  cefTRIAXone (ROCEPHIN) 1 g in sodium chloride 0.9 % 100 mL IVPB (has no administration in time  range)  melatonin tablet 3 mg (has no administration in time range)  ondansetron (ZOFRAN) injection 4 mg (has no administration in time range)  potassium chloride SA (KLOR-CON M) CR tablet 40 mEq (has no administration in time range)  cefTRIAXone (ROCEPHIN) 1 g in sodium chloride 0.9 % 100 mL IVPB (1 g Intravenous New Bag/Given 08/20/22 0738)    Mobility walks with person assist

## 2022-08-20 NOTE — ED Provider Notes (Signed)
Patient cleared from theatric perspective.  Awaiting urinalysis.  Asked nursing multiple times for urine specimen.  Patient is 89 years old with limited eyesight.  She lives alone.  Was having hallucinations.  Urinalysis obtained.  Consistent with UTI nitrite positive with 6-10 white cells and many bacteria.  Urine culture was obtained and will give a dose of IV Rocephin.  Patient lives alone.  Given active hallucinations and concerns for safety with active infection, will admit for IV antibiotics.   Shon Baton, MD 08/20/22 [phone]
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
## 2022-08-21 DIAGNOSIS — I5022 Chronic systolic (congestive) heart failure: Secondary | ICD-10-CM | POA: Diagnosis present

## 2022-08-21 DIAGNOSIS — M6281 Muscle weakness (generalized): Secondary | ICD-10-CM | POA: Diagnosis not present

## 2022-08-21 DIAGNOSIS — I130 Hypertensive heart and chronic kidney disease with heart failure and stage 1 through stage 4 chronic kidney disease, or unspecified chronic kidney disease: Secondary | ICD-10-CM | POA: Diagnosis present

## 2022-08-21 DIAGNOSIS — G4730 Sleep apnea, unspecified: Secondary | ICD-10-CM | POA: Diagnosis present

## 2022-08-21 DIAGNOSIS — I700 Atherosclerosis of aorta: Secondary | ICD-10-CM | POA: Diagnosis not present

## 2022-08-21 DIAGNOSIS — N1831 Chronic kidney disease, stage 3a: Secondary | ICD-10-CM | POA: Diagnosis present

## 2022-08-21 DIAGNOSIS — I4819 Other persistent atrial fibrillation: Secondary | ICD-10-CM | POA: Diagnosis not present

## 2022-08-21 DIAGNOSIS — R531 Weakness: Secondary | ICD-10-CM | POA: Diagnosis not present

## 2022-08-21 DIAGNOSIS — N390 Urinary tract infection, site not specified: Secondary | ICD-10-CM | POA: Diagnosis present

## 2022-08-21 DIAGNOSIS — H353132 Nonexudative age-related macular degeneration, bilateral, intermediate dry stage: Secondary | ICD-10-CM | POA: Diagnosis not present

## 2022-08-21 DIAGNOSIS — H401130 Primary open-angle glaucoma, bilateral, stage unspecified: Secondary | ICD-10-CM | POA: Diagnosis not present

## 2022-08-21 DIAGNOSIS — I5021 Acute systolic (congestive) heart failure: Secondary | ICD-10-CM

## 2022-08-21 DIAGNOSIS — M47812 Spondylosis without myelopathy or radiculopathy, cervical region: Secondary | ICD-10-CM | POA: Diagnosis not present

## 2022-08-21 DIAGNOSIS — J188 Other pneumonia, unspecified organism: Secondary | ICD-10-CM | POA: Diagnosis not present

## 2022-08-21 DIAGNOSIS — H401133 Primary open-angle glaucoma, bilateral, severe stage: Secondary | ICD-10-CM

## 2022-08-21 DIAGNOSIS — J189 Pneumonia, unspecified organism: Secondary | ICD-10-CM | POA: Diagnosis present

## 2022-08-21 DIAGNOSIS — Z79890 Hormone replacement therapy (postmenopausal): Secondary | ICD-10-CM | POA: Diagnosis not present

## 2022-08-21 DIAGNOSIS — R443 Hallucinations, unspecified: Secondary | ICD-10-CM | POA: Diagnosis present

## 2022-08-21 DIAGNOSIS — R918 Other nonspecific abnormal finding of lung field: Secondary | ICD-10-CM | POA: Diagnosis not present

## 2022-08-21 DIAGNOSIS — M1990 Unspecified osteoarthritis, unspecified site: Secondary | ICD-10-CM | POA: Insufficient documentation

## 2022-08-21 DIAGNOSIS — R2689 Other abnormalities of gait and mobility: Secondary | ICD-10-CM | POA: Diagnosis not present

## 2022-08-21 DIAGNOSIS — F22 Delusional disorders: Secondary | ICD-10-CM | POA: Diagnosis present

## 2022-08-21 DIAGNOSIS — Z515 Encounter for palliative care: Secondary | ICD-10-CM | POA: Diagnosis not present

## 2022-08-21 DIAGNOSIS — F0392 Unspecified dementia, unspecified severity, with psychotic disturbance: Secondary | ICD-10-CM | POA: Diagnosis present

## 2022-08-21 DIAGNOSIS — J90 Pleural effusion, not elsewhere classified: Secondary | ICD-10-CM | POA: Diagnosis not present

## 2022-08-21 DIAGNOSIS — Z79899 Other long term (current) drug therapy: Secondary | ICD-10-CM | POA: Diagnosis not present

## 2022-08-21 DIAGNOSIS — N179 Acute kidney failure, unspecified: Secondary | ICD-10-CM | POA: Diagnosis present

## 2022-08-21 DIAGNOSIS — H548 Legal blindness, as defined in USA: Secondary | ICD-10-CM | POA: Diagnosis present

## 2022-08-21 DIAGNOSIS — E038 Other specified hypothyroidism: Secondary | ICD-10-CM | POA: Diagnosis not present

## 2022-08-21 DIAGNOSIS — H353130 Nonexudative age-related macular degeneration, bilateral, stage unspecified: Secondary | ICD-10-CM | POA: Diagnosis not present

## 2022-08-21 DIAGNOSIS — I6782 Cerebral ischemia: Secondary | ICD-10-CM | POA: Diagnosis not present

## 2022-08-21 DIAGNOSIS — I10 Essential (primary) hypertension: Secondary | ICD-10-CM | POA: Diagnosis not present

## 2022-08-21 DIAGNOSIS — G3184 Mild cognitive impairment, so stated: Secondary | ICD-10-CM

## 2022-08-21 DIAGNOSIS — R410 Disorientation, unspecified: Secondary | ICD-10-CM | POA: Diagnosis not present

## 2022-08-21 DIAGNOSIS — Z8673 Personal history of transient ischemic attack (TIA), and cerebral infarction without residual deficits: Secondary | ICD-10-CM

## 2022-08-21 DIAGNOSIS — F32A Depression, unspecified: Secondary | ICD-10-CM | POA: Diagnosis present

## 2022-08-21 DIAGNOSIS — R0600 Dyspnea, unspecified: Secondary | ICD-10-CM | POA: Diagnosis not present

## 2022-08-21 DIAGNOSIS — R41841 Cognitive communication deficit: Secondary | ICD-10-CM | POA: Diagnosis not present

## 2022-08-21 DIAGNOSIS — G8191 Hemiplegia, unspecified affecting right dominant side: Secondary | ICD-10-CM | POA: Diagnosis not present

## 2022-08-21 DIAGNOSIS — N171 Acute kidney failure with acute cortical necrosis: Secondary | ICD-10-CM | POA: Diagnosis not present

## 2022-08-21 DIAGNOSIS — Z1389 Encounter for screening for other disorder: Secondary | ICD-10-CM | POA: Diagnosis not present

## 2022-08-21 DIAGNOSIS — G9341 Metabolic encephalopathy: Secondary | ICD-10-CM | POA: Diagnosis present

## 2022-08-21 DIAGNOSIS — E785 Hyperlipidemia, unspecified: Secondary | ICD-10-CM | POA: Diagnosis present

## 2022-08-21 DIAGNOSIS — E039 Hypothyroidism, unspecified: Secondary | ICD-10-CM | POA: Diagnosis present

## 2022-08-21 DIAGNOSIS — H547 Unspecified visual loss: Secondary | ICD-10-CM | POA: Diagnosis not present

## 2022-08-21 DIAGNOSIS — I69351 Hemiplegia and hemiparesis following cerebral infarction affecting right dominant side: Secondary | ICD-10-CM | POA: Diagnosis not present

## 2022-08-21 DIAGNOSIS — B9620 Unspecified Escherichia coli [E. coli] as the cause of diseases classified elsewhere: Secondary | ICD-10-CM | POA: Diagnosis present

## 2022-08-21 DIAGNOSIS — E876 Hypokalemia: Secondary | ICD-10-CM | POA: Diagnosis present

## 2022-08-21 DIAGNOSIS — F411 Generalized anxiety disorder: Secondary | ICD-10-CM | POA: Diagnosis not present

## 2022-08-21 DIAGNOSIS — Z7401 Bed confinement status: Secondary | ICD-10-CM | POA: Diagnosis not present

## 2022-08-21 DIAGNOSIS — F05 Delirium due to known physiological condition: Secondary | ICD-10-CM | POA: Diagnosis not present

## 2022-08-21 DIAGNOSIS — I480 Paroxysmal atrial fibrillation: Secondary | ICD-10-CM | POA: Diagnosis not present

## 2022-08-21 DIAGNOSIS — R1312 Dysphagia, oropharyngeal phase: Secondary | ICD-10-CM | POA: Diagnosis not present

## 2022-08-21 DIAGNOSIS — Z7901 Long term (current) use of anticoagulants: Secondary | ICD-10-CM | POA: Diagnosis not present

## 2022-08-21 DIAGNOSIS — I482 Chronic atrial fibrillation, unspecified: Secondary | ICD-10-CM | POA: Diagnosis present

## 2022-08-21 DIAGNOSIS — M160 Bilateral primary osteoarthritis of hip: Secondary | ICD-10-CM | POA: Diagnosis not present

## 2022-08-21 DIAGNOSIS — I517 Cardiomegaly: Secondary | ICD-10-CM | POA: Diagnosis not present

## 2022-08-21 DIAGNOSIS — H04412 Chronic dacryocystitis of left lacrimal passage: Secondary | ICD-10-CM | POA: Diagnosis present

## 2022-08-21 LAB — RENAL FUNCTION PANEL
Albumin: 3.4 g/dL — ABNORMAL LOW (ref 3.5–5.0)
Anion gap: 11 (ref 5–15)
BUN: 29 mg/dL — ABNORMAL HIGH (ref 8–23)
CO2: 25 mmol/L (ref 22–32)
Calcium: 8.6 mg/dL — ABNORMAL LOW (ref 8.9–10.3)
Chloride: 104 mmol/L (ref 98–111)
Creatinine, Ser: 1.23 mg/dL — ABNORMAL HIGH (ref 0.44–1.00)
GFR, Estimated: 41 mL/min — ABNORMAL LOW (ref >60)
Glucose, Bld: 122 mg/dL — ABNORMAL HIGH (ref 70–99)
Phosphorus: 3.4 mg/dL (ref 2.5–4.6)
Potassium: 3.9 mmol/L (ref 3.5–5.1)
Sodium: 140 mmol/L (ref 135–145)

## 2022-08-21 LAB — URINE CULTURE

## 2022-08-21 LAB — MAGNESIUM: Magnesium: 2.1 mg/dL (ref 1.7–2.4)

## 2022-08-21 LAB — CBC
HCT: 44.7 % (ref 36.0–46.0)
Hemoglobin: 13.9 g/dL (ref 12.0–15.0)
MCH: 28.8 pg (ref 26.0–34.0)
MCHC: 31.1 g/dL (ref 30.0–36.0)
MCV: 92.7 fL (ref 80.0–100.0)
Platelets: 199 10*3/uL (ref 150–400)
RBC: 4.82 MIL/uL (ref 3.87–5.11)
RDW: 17.3 % — ABNORMAL HIGH (ref 11.5–15.5)
WBC: 7.6 10*3/uL (ref 4.0–10.5)
nRBC: 0 % (ref 0.0–0.2)

## 2022-08-21 LAB — CK: Total CK: 87 U/L (ref 38–234)

## 2022-08-21 LAB — RPR: RPR Ser Ql: NONREACTIVE

## 2022-08-21 LAB — T4, FREE: Free T4: 1.45 ng/dL — ABNORMAL HIGH (ref 0.61–1.12)

## 2022-08-21 MED ORDER — METOPROLOL SUCCINATE ER 50 MG PO TB24
50.0000 mg | ORAL_TABLET | Freq: Every day | ORAL | Status: DC
  Administered 2022-08-22 – 2022-09-16 (×25): 50 mg via ORAL
  Filled 2022-08-21 (×26): qty 1

## 2022-08-21 MED ORDER — THIAMINE MONONITRATE 100 MG PO TABS
100.0000 mg | ORAL_TABLET | Freq: Every day | ORAL | Status: DC
  Administered 2022-08-21 – 2022-09-16 (×26): 100 mg via ORAL
  Filled 2022-08-21 (×27): qty 1

## 2022-08-21 MED ORDER — QUETIAPINE FUMARATE 25 MG PO TABS
12.5000 mg | ORAL_TABLET | Freq: Two times a day (BID) | ORAL | Status: DC | PRN
  Administered 2022-08-21 – 2022-08-24 (×2): 12.5 mg via ORAL
  Filled 2022-08-21 (×4): qty 1
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
## 2022-08-21 NOTE — Progress Notes (Signed)
PROGRESS NOTE  Amanda Cochran BMW:[id number] DOB: November 04, 1928   PCP: [name], MD  Patient is from: Home.  Lives alone.  Has no children.  DOA: 08/19/2022 LOS: 0  Chief complaints Chief Complaint  Patient presents with   Hallucinations     Brief Narrative / Interim history: 89 year old F with PMH of CVA with residual right hemiparesis, A-fib on Eliquis, HTN, HLD, hypothyroidism, osteoarthritis and glaucoma brought to ED by EMS after neighbors here due to screaming and called EMS and admitted for paranoid delusion, hallucination, UTI and possible acute metabolic encephalopathy.  Per patient's niece, Gigi Gin who lives in [place] "patient has no biological children.  Her nephew, Thayer Ohm was her HPOA.  Gigi Gin is not sure if Thayer Ohm has seen her because they had falling out due to patient's paranoia thinking that Thayer Ohm and other people were out to get her and take her home from her.   She states patient does have a brother who lives in [facility], and he is in poorer health the patient.  She states that [employer] and [employer], are patient's neighbors who assist her with doctor's visits and groceries.  She states that Susy Frizzle is a Midwife and their numbers are [phone] for Susy Frizzle and Brittney's number is [phone].   In the ED, stable vitals.  Brief RVR to 130s. Cr 1.31.  BUN 31.  WBC 11.1.  UA concerning for UTI.  UDS negative.  EtOH level <10.  CT head without acute finding.  Urine culture obtained.  Started on IV ceftriaxone.  Also started on Toprol-XL for brief A-fib.  Echocardiogram ordered.  Evaluated and cleared by psychiatry.   The next day, urine culture with 20,000 colonies of E. coli.  Remains paranoid.  TTE with LVEF of 30 to 35%, GH, indeterminate DD, severe LAE and RAE.      Subjective: Seen and examined earlier this morning.  No major events overnight of this morning.  Remains confused and paranoid.  She thinks people are in her house trying to take it.  She  denies hearing voices or seeing things here.  She initially said "they broke my feet" but not able to elaborate.  She is oriented x 4 but no great insight.  Objective: Vitals:   08/20/22 1409 08/20/22 1744 08/20/22 2128 08/21/22 1005  BP: (!) 113/59 133/71 133/69 (!) 161/81  Pulse: (!) 136 74 (!) 58 92  Resp:    20  Temp:   97.8 F (36.6 C) 97.8 F (36.6 C)  TempSrc:   Oral Oral  SpO2: 98% 99% 93% 97%  Weight:      Height:        Examination:  GENERAL: Sitting on bedside chair.  Staff at bedside. HEENT: MMM.  Vision and hearing grossly intact.  NECK: Supple.  No apparent JVD.  RESP:  No IWOB.  Fair aeration bilaterally. CVS:  RRR. Heart sounds normal.  ABD/GI/GU: BS+. Abd soft, NTND.  MSK/EXT:  Moves extremities. No apparent deformity. No edema.  SKIN: no apparent skin lesion or wound NEURO: Awake, alert and oriented x 4.  Follows commands.  No apparent focal neuro deficit. PSYCH: No agitation or distress but seems to have paranoia.  Procedures:  None  Microbiology summarized: Urine culture with 30,000 colonies of E. coli  Assessment and plan: Active Problems:   Essential hypertension   Hyperlipidemia   Right hemiparesis (HCC)   Left sided lacunar stroke [facility])   Atrial fibrillation (HCC)   History of CVA (cerebrovascular  accident)   MCI (mild cognitive impairment)   Primary open angle glaucoma of both eyes, severe stage   Delirium   Paranoid delusion (HCC)   Acute HFrEF (heart failure with reduced ejection fraction) (HCC)   Osteoarthritis   AKI (acute kidney injury) (HCC)  Delirium/paranoid delusion: Brought to ED by EMS after neighbor noticed screaming and called EMS.  Reportedly had paranoia for some time thinking that there are people in her house trying to take the house from her.  Reportedly has no children.  Has niece, nephew and brothers who are reportedly not in good health to look up with the patient.  Concern about UTI based on urinalysis.  CT head  without acute finding.  No prior psychiatric history of dementia.  TSH slightly elevated.  Ammonia, B12, RPR, vitamin D level within normal. -Evaluated by psychiatry and cleared -Follow vitamin B1 level and free T4.  Oral thiamine. -Treat treatable causes such as UTI and AKI. -Reorientation and delirium precaution -Avoid or minimize sedating medication -PT/OT/TOC consult -Will ask psychiatry to reassess once cleared medically.  Urinary tract infection: UA concerning for UTI.  Urine culture with 20,000 colonies of E. coli.  She is not a reliable historian. -Continue IV ceftriaxone pending culture sensitivity  Acute HFrEF: TTE with LVEF of 30 to 35% (50 to 55% in 2020), GH, indeterminate DD, severe LAE/RAE and moderate MR.  Does not seem to be in exacerbation.  Does not seem to be on diuretics. -Curb sided cardiology.  No indication for heart catheterization.  Optimize medically with GDMT as tolerated. -Started Toprol-XL currently for A-fib.  Increase to 50 mg daily starting 5/29 -Hold ACE inhibitors to allow renal recovery. -Discontinue amlodipine on discharge -Can consider adding ARB or BiDil based on renal function prior to discharge  AKI: Baseline Cr about 0.9.  Likely prerenal and concurrent use of ACE inhibitor's. Recent Labs    08/19/22 2005 08/20/22 0739 08/21/22 0845  BUN 31* 29* 29*  CREATININE 1.31* 1.20* 1.23*  -Hold benazepril -Monitor off IV fluid -Check CK   Persistent A-fib: Had brief RVR in ED that has resolved.  Started on Toprol-XL -Continue Toprol-XL -Continue home Eliquis -Optimize electrolytes  Essential hypertension: Normotensive for most part. -Toprol-XL -Hold home hydralazine and benazepril  Hypothyroidism: TSH slightly elevated -Check free T4 -Continue Synthroid  Osteoarthritis -Continue Tylenol as needed  Glaucoma -Continue home eyedrops  History of CVA with right hemiparesis: Stable -Continue home Lipitor and Eliquis.  Hypokalemia:  Resolved.          DVT prophylaxis:  SCDs Start: 08/20/22 0644 apixaban (ELIQUIS) tablet 5 mg  Code Status: Full code Family Communication: None at bedside Level of care: Med-Surg Status is: Observation The patient will require care spanning > 2 midnights and should be moved to inpatient because: Paranoid delusion, UTI, AKI, acute CHF   Final disposition: TBD Consultants:  Psychiatry  55 minutes with more than 50% spent in reviewing records, counseling patient/family and coordinating care.   Sch Meds:  Scheduled Meds:  apixaban  5 mg Oral BID   cholecalciferol  2,000 Units Oral Daily   dorzolamide-timolol  1 drop Both Eyes BID   latanoprost  1 drop Both Eyes QHS   levothyroxine  100 mcg Oral Q0600   [START ON 08/22/2022] metoprolol succinate  50 mg Oral Daily   pilocarpine  1 drop Both Eyes QID   Continuous Infusions:  cefTRIAXone (ROCEPHIN)  IV Stopped (08/21/22 0921)   PRN Meds:.acetaminophen **OR** acetaminophen, melatonin, ondansetron (ZOFRAN) IV  Antimicrobials: Anti-infectives (From admission, onward)    Start     Dose/Rate Route Frequency Ordered Stop   08/21/22 0800  cefTRIAXone (ROCEPHIN) 1 g in sodium chloride 0.9 % 100 mL IVPB        1 g 200 mL/hr over 30 Minutes Intravenous Every 24 hours 08/20/22 0644     08/20/22 0600  cefTRIAXone (ROCEPHIN) 1 g in sodium chloride 0.9 % 100 mL IVPB        1 g 200 mL/hr over 30 Minutes Intravenous  Once 08/20/22 0557 08/20/22 0808        I have personally reviewed the following labs and images: CBC: Recent Labs  Lab 08/19/22 2005 08/20/22 0739 08/21/22 0845  WBC 11.1* 9.3 7.6  NEUTROABS 8.4* 7.0  --   HGB 13.5 14.0 13.9  HCT 43.1 43.6 44.7  MCV 91.7 91.4 92.7  PLT 181 195 199   BMP &GFR Recent Labs  Lab 08/19/22 2005 08/20/22 0739 08/21/22 0845  NA 141 139 140  K 3.6 3.4* 3.9  CL 104 105 104  CO2 26 24 25   GLUCOSE 101* 109* 122*  BUN 31* 29* 29*  CREATININE 1.31* 1.20* 1.23*  CALCIUM 9.6 9.0  8.6*  MG  --  1.9 2.1  PHOS  --   --  3.4   Estimated Creatinine Clearance: 26.8 mL/min (A) (by C-G formula based on SCr of 1.23 mg/dL (H)). Liver & Pancreas: Recent Labs  Lab 08/19/22 2005 08/20/22 0739 08/21/22 0845  AST 37 34  --   ALT 25 22  --   ALKPHOS 46 46  --   BILITOT 1.3* 1.2  --   PROT 7.0 6.6  --   ALBUMIN 3.8 3.6 3.4*   No results for input(s): "LIPASE", "AMYLASE" in the last 168 hours. Recent Labs  Lab 08/20/22 1226  AMMONIA <10   Diabetic: No results for input(s): "HGBA1C" in the last 72 hours. No results for input(s): "GLUCAP" in the last 168 hours. Cardiac Enzymes: No results for input(s): "CKTOTAL", "CKMB", "CKMBINDEX", "TROPONINI" in the last 168 hours. No results for input(s): "PROBNP" in the last 8760 hours. Coagulation Profile: No results for input(s): "INR", "PROTIME" in the last 168 hours. Thyroid Function Tests: Recent Labs    08/20/22 1153  TSH 6.142*   Lipid Profile: No results for input(s): "CHOL", "HDL", "LDLCALC", "TRIG", "CHOLHDL", "LDLDIRECT" in the last 72 hours. Anemia Panel: Recent Labs    08/20/22 1153  VITAMINB12 665   Urine analysis:    Component Value Date/Time   COLORURINE YELLOW 08/20/2022 0440   APPEARANCEUR CLEAR 08/20/2022 0440   LABSPEC 1.023 08/20/2022 0440   PHURINE 5.0 08/20/2022 0440   GLUCOSEU NEGATIVE 08/20/2022 0440   HGBUR NEGATIVE 08/20/2022 0440   BILIRUBINUR NEGATIVE 08/20/2022 0440   KETONESUR 5 (A) 08/20/2022 0440   PROTEINUR 30 (A) 08/20/2022 0440   UROBILINOGEN 0.2 02/06/2014 2331   NITRITE POSITIVE (A) 08/20/2022 0440   LEUKOCYTESUR NEGATIVE 08/20/2022 0440   Sepsis Labs: Invalid input(s): "PROCALCITONIN", "LACTICIDVEN"  Microbiology: Recent Results (from the past 240 hour(s))  Urine Culture     Status: Abnormal (Preliminary result)   Collection Time: 08/20/22  4:40 AM   Specimen: Urine, Catheterized  Result Value Ref Range Status   Specimen Description   Final    URINE,  CATHETERIZED Performed at [facility], [address], [place], [place] [address]    Special Requests   Final    NONE Performed at [facility], [address] [place], [place] [address]    Culture (A)  Final    30,000 COLONIES/mL ESCHERICHIA COLI SUSCEPTIBILITIES TO FOLLOW Performed at [facility], [address] [place], [place] [address]    Report Status PENDING  Incomplete    Radiology Studies: ECHOCARDIOGRAM COMPLETE  Result Date: 08/20/2022    ECHOCARDIOGRAM REPORT   Patient Name:   SERAPHINA ZAKOWSKI Date of Exam: 08/20/2022 Medical Rec #:  [id number]    Height:       65.0 in Accession #:    [id number]   Weight:       136.9 lb Date of Birth:  [birth year]    BSA:          1.684 m Patient Age:    89 years     BP:           153/82 mmHg Patient Gender: F            HR:           70 bpm. Exam Location:  Inpatient Procedure: 2D Echo, Cardiac Doppler and Color Doppler Indications:    Arrhythmia  History:        Patient has prior history of Echocardiogram examinations, most                 recent 01/13/2019. Stroke; Risk Factors:Hypertension.  Sonographer:    Darlys Gales Referring Phys: [id number] DAVID MANUEL ORTIZ IMPRESSIONS  1. Wall motion difficult to assess due to incomplete visualization of the LV endocardium but appears globally hypokinetic on limited view. Left ventricular ejection fraction, by estimation, is 30 to 35%. The left ventricle has moderately decreased function. The left ventricular internal cavity size was mildly dilated. Left ventricular diastolic parameters are indeterminate.  2. Right ventricular systolic function is moderately reduced. The right ventricular size is mildly enlarged. There is mildly elevated pulmonary artery systolic pressure. The estimated right ventricular systolic pressure is 39.2 mmHg.  3. Left atrial size was severely dilated.  4. Right atrial size was severely dilated.  5. The mitral valve is grossly normal. Moderate  mitral valve regurgitation.  6. The aortic valve is tricuspid. There is mild calcification of the aortic valve. There is mild thickening of the aortic valve. Aortic valve regurgitation is trivial. Aortic valve sclerosis/calcification is present, without any evidence of aortic stenosis.  7. The inferior vena cava is dilated in size with <50% respiratory variability, suggesting right atrial pressure of 15 mmHg. Comparison(s): Compared to prior TTE in 2020, the EF has dropped from 50-55% to 30-35%. There is now functional, moderate MR. RV systolic function also now appears reduced. FINDINGS  Left Ventricle: Wall motion difficult to assess due to incomplete visualization of the LV endocardium but appears globally hypokinetic on limited view. Left ventricular ejection fraction, by estimation, is 30 to 35%. The left ventricle has moderately decreased function. The left ventricular internal cavity size was mildly dilated. There is no left ventricular hypertrophy. Left ventricular diastolic parameters are indeterminate. Right Ventricle: The right ventricular size is mildly enlarged. Right vetricular wall thickness was not well visualized. Right ventricular systolic function is moderately reduced. There is mildly elevated pulmonary artery systolic pressure. The tricuspid  regurgitant velocity is 2.46 m/s, and with an assumed right atrial pressure of 15 mmHg, the estimated right ventricular systolic pressure is 39.2 mmHg. Left Atrium: Left atrial size was severely dilated. Right Atrium: Right atrial size was severely dilated. Pericardium: There is no evidence of pericardial effusion. Mitral Valve: The mitral  valve is grossly normal. Moderate mitral valve regurgitation. Tricuspid Valve: The tricuspid valve is normal in structure. Tricuspid valve regurgitation is mild. Aortic Valve: The aortic valve is tricuspid. There is mild calcification of the aortic valve. There is mild thickening of the aortic valve. Aortic valve  regurgitation is trivial. Aortic regurgitation PHT measures 508 msec. Aortic valve sclerosis/calcification is present, without any evidence of aortic stenosis. Aortic valve mean gradient measures 2.0 mmHg. Aortic valve peak gradient measures 2.8 mmHg. Aortic valve area, by VTI measures 2.00 cm. Pulmonic Valve: The pulmonic valve was not well visualized. Pulmonic valve regurgitation is trivial. Aorta: The aortic root is normal in size and structure. Venous: The inferior vena cava is dilated in size with less than 50% respiratory variability, suggesting right atrial pressure of 15 mmHg. IAS/Shunts: The atrial septum is grossly normal.  LEFT VENTRICLE PLAX 2D LVIDd:         5.60 cm   Diastology LVIDs:         5.00 cm   LV e' medial:   3.15 cm/s LV PW:         0.80 cm   LV E/e' medial: 28.4 LV IVS:        0.70 cm LVOT diam:     1.70 cm LV SV:         27 LV SV Index:   16 LVOT Area:     2.27 cm  LEFT ATRIUM              Index LA Vol (A2C):   69.6 ml  41.33 ml/m LA Vol (A4C):   120.0 ml 71.27 ml/m LA Biplane Vol: 95.9 ml  56.95 ml/m  AORTIC VALVE AV Area (Vmax):    1.57 cm AV Area (Vmean):   1.72 cm AV Area (VTI):     2.00 cm AV Vmax:           83.20 cm/s AV Vmean:          59.200 cm/s AV VTI:            0.134 m AV Peak Grad:      2.8 mmHg AV Mean Grad:      2.0 mmHg LVOT Vmax:         57.40 cm/s LVOT Vmean:        44.800 cm/s LVOT VTI:          0.118 m LVOT/AV VTI ratio: 0.88 AI PHT:            508 msec  AORTA Ao Root diam: 1.80 cm Ao Asc diam:  2.50 cm MITRAL VALVE               TRICUSPID VALVE MV Area (PHT): 6.48 cm    TR Peak grad:   24.2 mmHg MV Decel Time: 117 msec    TR Vmax:        246.00 cm/s MV E velocity: 89.50 cm/s                            SHUNTS                            Systemic VTI:  0.12 m                            Systemic Diam: 1.70 cm Laurance Flatten MD Electronically signed by Herbert Seta  Shari Prows MD Signature Date/Time: 08/20/2022/2:42:40 PM    Final        T.  [facility]ist  If 7PM-7AM, please contact night-coverage [email or web address] 08/21/2022, 12:44 PM

## 2022-08-22 DIAGNOSIS — G9341 Metabolic encephalopathy: Secondary | ICD-10-CM

## 2022-08-22 DIAGNOSIS — E038 Other specified hypothyroidism: Secondary | ICD-10-CM

## 2022-08-22 DIAGNOSIS — N390 Urinary tract infection, site not specified: Secondary | ICD-10-CM | POA: Diagnosis not present

## 2022-08-22 DIAGNOSIS — I482 Chronic atrial fibrillation, unspecified: Secondary | ICD-10-CM

## 2022-08-22 DIAGNOSIS — I10 Essential (primary) hypertension: Secondary | ICD-10-CM

## 2022-08-22 DIAGNOSIS — N179 Acute kidney failure, unspecified: Secondary | ICD-10-CM

## 2022-08-22 DIAGNOSIS — F22 Delusional disorders: Secondary | ICD-10-CM | POA: Diagnosis not present

## 2022-08-22 DIAGNOSIS — B9620 Unspecified Escherichia coli [E. coli] as the cause of diseases classified elsewhere: Secondary | ICD-10-CM

## 2022-08-22 DIAGNOSIS — E039 Hypothyroidism, unspecified: Secondary | ICD-10-CM | POA: Diagnosis present

## 2022-08-22 DIAGNOSIS — I5022 Chronic systolic (congestive) heart failure: Secondary | ICD-10-CM | POA: Diagnosis not present

## 2022-08-22 DIAGNOSIS — N1831 Chronic kidney disease, stage 3a: Secondary | ICD-10-CM

## 2022-08-22 LAB — URINE CULTURE: Culture: 30000 — AB

## 2022-08-22 LAB — COMPREHENSIVE METABOLIC PANEL
ALT: 22 U/L (ref 0–44)
AST: 24 U/L (ref 15–41)
Albumin: 3.3 g/dL — ABNORMAL LOW (ref 3.5–5.0)
Alkaline Phosphatase: 45 U/L (ref 38–126)
Anion gap: 13 (ref 5–15)
BUN: 25 mg/dL — ABNORMAL HIGH (ref 8–23)
CO2: 22 mmol/L (ref 22–32)
Calcium: 9 mg/dL (ref 8.9–10.3)
Chloride: 104 mmol/L (ref 98–111)
Creatinine, Ser: 1.07 mg/dL — ABNORMAL HIGH (ref 0.44–1.00)
GFR, Estimated: 49 mL/min — ABNORMAL LOW (ref >60)
Glucose, Bld: 122 mg/dL — ABNORMAL HIGH (ref 70–99)
Potassium: 3.9 mmol/L (ref 3.5–5.1)
Sodium: 139 mmol/L (ref 135–145)
Total Bilirubin: 1 mg/dL (ref 0.3–1.2)
Total Protein: 6.9 g/dL (ref 6.5–8.1)

## 2022-08-22 LAB — CBC WITH DIFFERENTIAL/PLATELET
Abs Immature Granulocytes: 0.02 10*3/uL (ref 0.00–0.07)
Basophils Absolute: 0.1 10*3/uL (ref 0.0–0.1)
Basophils Relative: 1 %
Eosinophils Absolute: 0.2 10*3/uL (ref 0.0–0.5)
Eosinophils Relative: 2 %
HCT: 45.1 % (ref 36.0–46.0)
Hemoglobin: 13.8 g/dL (ref 12.0–15.0)
Immature Granulocytes: 0 %
Lymphocytes Relative: 19 %
Lymphs Abs: 1.6 10*3/uL (ref 0.7–4.0)
MCH: 28.9 pg (ref 26.0–34.0)
MCHC: 30.6 g/dL (ref 30.0–36.0)
MCV: 94.5 fL (ref 80.0–100.0)
Monocytes Absolute: 0.4 10*3/uL (ref 0.1–1.0)
Monocytes Relative: 5 %
Neutro Abs: 5.9 10*3/uL (ref 1.7–7.7)
Neutrophils Relative %: 73 %
Platelets: 141 10*3/uL — ABNORMAL LOW (ref 150–400)
RBC: 4.77 MIL/uL (ref 3.87–5.11)
RDW: 17.2 % — ABNORMAL HIGH (ref 11.5–15.5)
WBC: 8.2 10*3/uL (ref 4.0–10.5)
nRBC: 0 % (ref 0.0–0.2)

## 2022-08-22 LAB — MAGNESIUM: Magnesium: 1.9 mg/dL (ref 1.7–2.4)

## 2022-08-22 LAB — BLOOD GAS, VENOUS
Acid-Base Excess: 1.2 mmol/L (ref 0.0–2.0)
Bicarbonate: 27.6 mmol/L (ref 20.0–28.0)
Drawn by: 7612
O2 Saturation: 25.9 %
Patient temperature: 36.4
pCO2, Ven: 49 mmHg (ref 44–60)
pH, Ven: 7.36 (ref 7.25–7.43)
pO2, Ven: <31 mmHg — CL (ref 32–45)

## 2022-08-22 LAB — VITAMIN B1: Vitamin B1 (Thiamine): 115.2 nmol/L (ref 66.5–200.0)

## 2022-08-22 LAB — C-REACTIVE PROTEIN: CRP: 1.1 mg/dL — ABNORMAL HIGH (ref <1.0)

## 2022-08-22 NOTE — Assessment & Plan Note (Signed)
Echocardiogram 6/27 revealing newly depressed ejection fraction of 30 to 35% Titrating beta-blocker therapy to ensure atrial fibrillation is better controlled No clinical evidence of volume overload

## 2022-08-22 NOTE — Assessment & Plan Note (Signed)
Continue metoprolol  Relaxed blood pressure targets due to advanced age.

## 2022-08-22 NOTE — Assessment & Plan Note (Signed)
Continue Synthroid °

## 2022-08-22 NOTE — Assessment & Plan Note (Signed)
Strict intake and output monitoring Gentle intermittent intravenous hydration Serial chemistries to monitor renal function and electrolytes

## 2022-08-22 NOTE — Hospital Course (Signed)
89 y.o. female with medical history significant of open-angle glaucoma of both eyes with legal blindness, CVA with some residual right-sided weakness (uses a cane to ambulate), hyperlipidemia, hypertension, atrial fibrillation on Eliquis, hypothyroidism who was brought to the emergency department after she was found screaming in her house at "intruders."    Patient was felt to be suffering from acute metabolic encephalopathy secondary to possible urinary tract infection.  The hospitalist group was called to assess the patient for admission to the hospital.    Patient was placed on intravenous antibiotics with ceftriaxone.  During this hospitalization, echocardiogram revealed markedly low ejection fraction of 30 to 35% with indeterminate diastolic function and.  Patient did not appear to be in acute heart failure.  Case was discussed with cardiology and home regimen of beta-blocker therapy was titrated.  Doxycycline was added to the ceftriaxone later in the hospitalization due to additional concerns for possible pneumonia.  Over the course of the hospitalization the patient has exhibited bouts of agitation and delusions.  Psychiatry was consulted and felt the patient was likely suffering from residual delirium from her underlying illness and did not believe that the patient was suffering an underlying psychiatric condition.

## 2022-08-22 NOTE — Assessment & Plan Note (Signed)
Continue home regimen of apixaban Now on Toprol XL 50 mg daily
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
## 2022-08-22 NOTE — Evaluation (Signed)
Physical Therapy Evaluation Patient Details Name: Amanda Cochran MRN: [id number] DOB: [birth year] Today's Date: 08/22/2022  History of Present Illness  Pt is a 89yo female presenting to [facility] ED on 6/27 secondary AME with associated UTI, CT head negative for acute findings.   PMH: depression, hx of CVA (L basal ganglia infarct with R hemiparesis), Hld, HTN, hx of left eye lacunar stroke,   Clinical Impression  Pt admitted with above diagnosis. Pt received supine in bed with nurse in room at start of session, pt alert and oriented x3 able to name self, location, and day of the week but not date nor president; pt perseverating on "there are people living in my house while I am living there, I have to get back home to make sure everything is okay." Pt with reduced vision but able to manage near-objects and avoid obstacles close to pt. Pt demonstrated mod I with bed mobility (HOB elevated) and transfers, no physical assist nor cuing required to RW, Pt ambulated 7ft with RW and CGA, cues to avoid obstacles at further distance.3 Pt currently with functional limitations due to the deficits listed below (see PT Problem List). Pt will benefit from acute skilled PT to increase their independence and safety with mobility to allow discharge.         Recommendations for follow up therapy are one component of a multi-disciplinary discharge planning process, led by the attending physician.  Recommendations may be updated based on patient status, additional functional criteria and insurance authorization.  Follow Up Recommendations       Assistance Recommended at Discharge Frequent or constant Supervision/Assistance  Patient can return home with the following  A little help with walking and/or transfers;A little help with bathing/dressing/bathroom;Assistance with cooking/housework;Direct supervision/assist for medications management;Direct supervision/assist for financial management;Help with stairs or ramp for  entrance;Assist for transportation    Equipment Recommendations None recommended by PT  Recommendations for Other Services       Functional Status Assessment Patient has had a recent decline in their functional status and demonstrates the ability to make significant improvements in function in a reasonable and predictable amount of time.     Precautions / Restrictions Precautions Precautions: Fall Restrictions Weight Bearing Restrictions: No      Mobility  Bed Mobility Overal bed mobility: Modified Independent             General bed mobility comments: Increased time    Transfers Overall transfer level: Modified independent Equipment used: Rolling walker (2 wheels)               General transfer comment: Increased time    Ambulation/Gait Ambulation/Gait assistance: Modified independent (Device/Increase time) Gait Distance (Feet): 60 Feet Assistive device: Rolling walker (2 wheels) Gait Pattern/deviations: WFL(Within Functional Limits) Gait velocity: decreased     General Gait Details: Pt ambulated with RW and CGA, no physical assist required or overt LOB noted, pt able to avoid obstacles very near but requiring some cuing for wayfinding  Stairs            Wheelchair Mobility    Modified Rankin (Stroke Patients Only)       Balance Overall balance assessment: No apparent balance deficits (not formally assessed)                                           Pertinent Vitals/Pain Pain Assessment Pain Assessment: No/denies  pain    Home Living Family/patient expects to be discharged to:: Private residence Living Arrangements: Alone Available Help at Discharge: Friend(s);Neighbor;Available PRN/intermittently Type of Home: House Home Access: Stairs to enter Entrance Stairs-Rails: Left;Right;Can reach both Entrance Stairs-Number of Steps: 3 Alternate Level Stairs-Number of Steps: 6 Home Layout: Two level Home Equipment: Clinical biochemist (2 wheels);Shower seat;BSC/3in1;Cane - single point;Wheelchair - manual      Prior Function Prior Level of Function : Independent/Modified Independent;Patient poor historian/Family not available             Mobility Comments: SPC ADLs Comments: Assist with paying bills, groceries, mowing     Hand Dominance   Dominant Hand: Right    Extremity/Trunk Assessment   Upper Extremity Assessment Upper Extremity Assessment: Defer to OT evaluation    Lower Extremity Assessment Lower Extremity Assessment: Overall WFL for tasks assessed    Cervical / Trunk Assessment Cervical / Trunk Assessment: Kyphotic  Communication   Communication: HOH  Cognition Arousal/Alertness: Awake/alert Behavior During Therapy: Anxious Overall Cognitive Status: No family/caregiver present to determine baseline cognitive functioning                                 General Comments: Pt AOx3, able to name self, location, and day of the week, unable to name president except "that guy I don't like much"        General Comments      Exercises     Assessment/Plan    PT Assessment Patient needs continued PT services  PT Problem List Decreased activity tolerance;Decreased balance;Decreased mobility;Decreased knowledge of use of DME       PT Treatment Interventions DME instruction;Gait training;Stair training;Functional mobility training;Therapeutic activities;Therapeutic exercise;Balance training;Neuromuscular re-education;Cognitive remediation;Wheelchair mobility training;Patient/family education    PT Goals (Current goals can be found in the Care Plan section)  Acute Rehab PT Goals Patient Stated Goal: To go home PT Goal Formulation: With patient Time For Goal Achievement: 09/05/22 Potential to Achieve Goals: Good    Frequency Min 1X/week     Co-evaluation               AM-PAC PT "6 Clicks" Mobility  Outcome Measure Help needed turning from your back to your  side while in a flat bed without using bedrails?: None Help needed moving from lying on your back to sitting on the side of a flat bed without using bedrails?: None Help needed moving to and from a bed to a chair (including a wheelchair)?: None Help needed standing up from a chair using your arms (e.g., wheelchair or bedside chair)?: None Help needed to walk in hospital room?: A Little Help needed climbing 3-5 steps with a railing? : A Lot 6 Click Score: 21    End of Session Equipment Utilized During Treatment: Gait belt Activity Tolerance: Patient tolerated treatment well;No increased pain Patient left: in chair;with call bell/phone within reach;with chair alarm set;with nursing/sitter in room Nurse Communication: Mobility status PT Visit Diagnosis: Unsteadiness on feet (R26.81);Difficulty in walking, not elsewhere classified (R26.2);Muscle weakness (generalized) (M62.81)    Time: 1000-1026 PT Time Calculation (min) (ACUTE ONLY): 26 min   Charges:   PT Evaluation $PT Eval Low Complexity: 1 Low PT Treatments $Gait Training: 8-22 mins        Jamesetta Geralds, PT, DPT [facility] Rehabilitation Department Office: [phone]  Jamesetta Geralds 08/22/2022, 10:39 AM

## 2022-08-22 NOTE — Assessment & Plan Note (Signed)
Transitioning to Augmentin to complete a total of 7 days of therapy for treatment of urinary tract infection and possible left lower lobe pneumonia. Unclear as to whether there has been any clinical improvement with treatment Urine cultures have grown out E. coli on admission
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
## 2022-08-23 ENCOUNTER — Inpatient Hospital Stay (HOSPITAL_COMMUNITY): Payer: Medicare Other

## 2022-08-23 DIAGNOSIS — J189 Pneumonia, unspecified organism: Secondary | ICD-10-CM | POA: Diagnosis not present

## 2022-08-23 DIAGNOSIS — I5022 Chronic systolic (congestive) heart failure: Secondary | ICD-10-CM | POA: Diagnosis not present

## 2022-08-23 DIAGNOSIS — N390 Urinary tract infection, site not specified: Secondary | ICD-10-CM | POA: Diagnosis not present

## 2022-08-23 DIAGNOSIS — G9341 Metabolic encephalopathy: Secondary | ICD-10-CM | POA: Diagnosis not present

## 2022-08-23 DIAGNOSIS — N171 Acute kidney failure with acute cortical necrosis: Secondary | ICD-10-CM

## 2022-08-23 LAB — CBC WITH DIFFERENTIAL/PLATELET
Abs Immature Granulocytes: 0.02 10*3/uL (ref 0.00–0.07)
Basophils Absolute: 0.1 10*3/uL (ref 0.0–0.1)
Basophils Relative: 2 %
Eosinophils Absolute: 0.2 10*3/uL (ref 0.0–0.5)
Eosinophils Relative: 3 %
HCT: 41.8 % (ref 36.0–46.0)
Hemoglobin: 13.1 g/dL (ref 12.0–15.0)
Immature Granulocytes: 0 %
Lymphocytes Relative: 23 %
Lymphs Abs: 1.8 10*3/uL (ref 0.7–4.0)
MCH: 29.3 pg (ref 26.0–34.0)
MCHC: 31.3 g/dL (ref 30.0–36.0)
MCV: 93.5 fL (ref 80.0–100.0)
Monocytes Absolute: 0.5 10*3/uL (ref 0.1–1.0)
Monocytes Relative: 7 %
Neutro Abs: 5 10*3/uL (ref 1.7–7.7)
Neutrophils Relative %: 65 %
Platelets: 199 10*3/uL (ref 150–400)
RBC: 4.47 MIL/uL (ref 3.87–5.11)
RDW: 17.1 % — ABNORMAL HIGH (ref 11.5–15.5)
WBC: 7.6 10*3/uL (ref 4.0–10.5)
nRBC: 0 % (ref 0.0–0.2)

## 2022-08-23 LAB — COMPREHENSIVE METABOLIC PANEL
ALT: 23 U/L (ref 0–44)
AST: 22 U/L (ref 15–41)
Albumin: 3.2 g/dL — ABNORMAL LOW (ref 3.5–5.0)
Alkaline Phosphatase: 49 U/L (ref 38–126)
Anion gap: 11 (ref 5–15)
BUN: 29 mg/dL — ABNORMAL HIGH (ref 8–23)
CO2: 22 mmol/L (ref 22–32)
Calcium: 8.6 mg/dL — ABNORMAL LOW (ref 8.9–10.3)
Chloride: 106 mmol/L (ref 98–111)
Creatinine, Ser: 1.13 mg/dL — ABNORMAL HIGH (ref 0.44–1.00)
GFR, Estimated: 46 mL/min — ABNORMAL LOW (ref >60)
Glucose, Bld: 115 mg/dL — ABNORMAL HIGH (ref 70–99)
Potassium: 3.7 mmol/L (ref 3.5–5.1)
Sodium: 139 mmol/L (ref 135–145)
Total Bilirubin: 0.7 mg/dL (ref 0.3–1.2)
Total Protein: 6.2 g/dL — ABNORMAL LOW (ref 6.5–8.1)

## 2022-08-23 LAB — MAGNESIUM: Magnesium: 2.1 mg/dL (ref 1.7–2.4)

## 2022-08-23 MED ORDER — AMOXICILLIN-POT CLAVULANATE 875-125 MG PO TABS
1.0000 | ORAL_TABLET | Freq: Two times a day (BID) | ORAL | Status: DC
  Administered 2022-08-24: 1 via ORAL
  Filled 2022-08-23: qty 1

## 2022-08-23 MED ORDER — DOXYCYCLINE HYCLATE 100 MG PO TABS
100.0000 mg | ORAL_TABLET | Freq: Two times a day (BID) | ORAL | Status: DC
  Administered 2022-08-23 – 2022-08-27 (×8): 100 mg via ORAL
  Filled 2022-08-23 (×9): qty 1

## 2022-08-23 MED ORDER — HYDRALAZINE HCL 20 MG/ML IJ SOLN: 10.0000 mg | Freq: Four times a day (QID) | INTRAMUSCULAR | Status: DC | PRN

## 2022-08-23 MED ORDER — MELATONIN 5 MG PO TABS
10.0000 mg | ORAL_TABLET | Freq: Every evening | ORAL | Status: DC | PRN
  Filled 2022-08-23 (×2): qty 2

## 2022-08-23 MED ORDER — HYDROXYZINE HCL 25 MG PO TABS
25.0000 mg | ORAL_TABLET | Freq: Four times a day (QID) | ORAL | Status: DC | PRN
  Administered 2022-09-01: 25 mg via ORAL
  Filled 2022-08-23 (×2): qty 1
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
## 2022-08-23 NOTE — Assessment & Plan Note (Signed)
Chest x-ray does reveal a left lower lobe atelectasis versus infiltrate.   Unlikely that this is truly pneumonia however treating in an attempt to reverse patient's confusion and agitation. Treating with combination of Augmentin and doxycycline
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
## 2022-08-24 DIAGNOSIS — J189 Pneumonia, unspecified organism: Secondary | ICD-10-CM | POA: Diagnosis not present

## 2022-08-24 DIAGNOSIS — G9341 Metabolic encephalopathy: Secondary | ICD-10-CM | POA: Diagnosis not present

## 2022-08-24 DIAGNOSIS — I5022 Chronic systolic (congestive) heart failure: Secondary | ICD-10-CM | POA: Diagnosis not present

## 2022-08-24 DIAGNOSIS — N390 Urinary tract infection, site not specified: Secondary | ICD-10-CM | POA: Diagnosis not present

## 2022-08-24 LAB — COMPREHENSIVE METABOLIC PANEL
ALT: 29 U/L (ref 0–44)
AST: 28 U/L (ref 15–41)
Albumin: 3.4 g/dL — ABNORMAL LOW (ref 3.5–5.0)
Alkaline Phosphatase: 53 U/L (ref 38–126)
Anion gap: 10 (ref 5–15)
BUN: 31 mg/dL — ABNORMAL HIGH (ref 8–23)
CO2: 24 mmol/L (ref 22–32)
Calcium: 8.8 mg/dL — ABNORMAL LOW (ref 8.9–10.3)
Chloride: 107 mmol/L (ref 98–111)
Creatinine, Ser: 1.36 mg/dL — ABNORMAL HIGH (ref 0.44–1.00)
GFR, Estimated: 37 mL/min — ABNORMAL LOW (ref >60)
Glucose, Bld: 115 mg/dL — ABNORMAL HIGH (ref 70–99)
Potassium: 3.9 mmol/L (ref 3.5–5.1)
Sodium: 141 mmol/L (ref 135–145)
Total Bilirubin: 0.8 mg/dL (ref 0.3–1.2)
Total Protein: 6.7 g/dL (ref 6.5–8.1)

## 2022-08-24 LAB — CBC WITH DIFFERENTIAL/PLATELET
Abs Immature Granulocytes: 0.03 10*3/uL (ref 0.00–0.07)
Basophils Absolute: 0.1 10*3/uL (ref 0.0–0.1)
Basophils Relative: 1 %
Eosinophils Absolute: 0 10*3/uL (ref 0.0–0.5)
Eosinophils Relative: 0 %
HCT: 44.8 % (ref 36.0–46.0)
Hemoglobin: 14 g/dL (ref 12.0–15.0)
Immature Granulocytes: 0 %
Lymphocytes Relative: 23 %
Lymphs Abs: 2.3 10*3/uL (ref 0.7–4.0)
MCH: 29.1 pg (ref 26.0–34.0)
MCHC: 31.3 g/dL (ref 30.0–36.0)
MCV: 93.1 fL (ref 80.0–100.0)
Monocytes Absolute: 0.4 10*3/uL (ref 0.1–1.0)
Monocytes Relative: 4 %
Neutro Abs: 7.2 10*3/uL (ref 1.7–7.7)
Neutrophils Relative %: 72 %
Platelets: 211 10*3/uL (ref 150–400)
RBC: 4.81 MIL/uL (ref 3.87–5.11)
RDW: 17.1 % — ABNORMAL HIGH (ref 11.5–15.5)
WBC: 10.1 10*3/uL (ref 4.0–10.5)
nRBC: 0 % (ref 0.0–0.2)

## 2022-08-24 LAB — PROCALCITONIN: Procalcitonin: <0.1 ng/mL

## 2022-08-24 LAB — MAGNESIUM: Magnesium: 2 mg/dL (ref 1.7–2.4)

## 2022-08-24 MED ORDER — LACTATED RINGERS IV SOLN: INTRAVENOUS | Status: AC

## 2022-08-24 MED ORDER — VALPROATE SODIUM 100 MG/ML IV SOLN
250.0000 mg | Freq: Two times a day (BID) | INTRAVENOUS | Status: DC
  Administered 2022-08-24 (×2): 250 mg via INTRAVENOUS
  Filled 2022-08-24 (×6): qty 2.5

## 2022-08-24 MED ORDER — AMOXICILLIN-POT CLAVULANATE 500-125 MG PO TABS
1.0000 | ORAL_TABLET | Freq: Two times a day (BID) | ORAL | Status: AC
  Administered 2022-08-24 – 2022-08-26 (×5): 1 via ORAL
  Filled 2022-08-24 (×5): qty 1

## 2022-08-24 NOTE — Progress Notes (Signed)
PT Note  Patient Details Name: [name] MRN: [id number] DOB: 11-Sep-1928   Cancelled Treatment:    Reason Eval/Treat Not Completed: Fatigue/lethargy limiting ability to participate this am and when PT returned pt was eating lunch in bed. PT to return to engage pt with therapy intervention later this afternoon if time allows.  [name], PT Acute Rehab   [name] 08/24/2022, 12:24 PM

## 2022-08-24 NOTE — Care Management Important Message (Signed)
Important Message  Patient Details IM Letter placed in Patient's room. Name: DNYLAH MATHES MRN: [id number] Date of Birth: 1928-07-31   Medicare Important Message Given:  Yes     Caren Macadam 08/24/2022, 9:46 AM

## 2022-08-24 NOTE — Consult Note (Signed)
[facility] Face-to-Face Psychiatry Consult   Reason for Consult:  Persistin delusions, negative workup for encephalopathy Referring Physician:  Dr. Leafy Half Patient Identification: Amanda Cochran MRN:  [id number] Principal Diagnosis: Acute metabolic encephalopathy Diagnosis:  Active Problems:   Essential hypertension   Hyperlipidemia   Right hemiparesis (HCC)   Left sided lacunar stroke [facility])   Atrial fibrillation, chronic (HCC)   History of CVA (cerebrovascular accident)   MCI (mild cognitive impairment)   Primary open angle glaucoma of both eyes, severe stage   Delirium   Paranoid delusion (HCC)   Acute HFrEF (heart failure with reduced ejection fraction) (HCC)   Osteoarthritis   Acute renal failure superimposed on stage 3a chronic kidney disease (HCC)   Chronic systolic CHF (congestive heart failure) (HCC)   E-coli UTI   Hypothyroidism   Pneumonia of left lower lobe due to infectious organism   Total Time spent with patient: 20 minutes  Subjective:   Amanda Cochran is a 89 y.o. female patient admitted with paranoid delusions.   Patient unable to participate in psych evaluation. She provides no meaningful information at this time. She is alert and oriented x1 (self) only. She is observed eating and feeding herself. She tells me that she is blind but can see, and she is observed tracking the room following this provider. She manages to eat her mac and cheese off her plate without any assistance. She does not appear to be delusional or paranoid on this evaluation. She does not appear to be responding to internal stimuli and external stimuli. She is a poor historian and cannot forward any contributing information.   Per nursing she appears to be having some sundowning. NO episodes of agitation documented since 06/28 (patient swinging at nursing staff). She has received 2 doses of Seroquel 12.5mg  po BID prn. Attempted to call nephew for collateral information and baseline.    HPI:  Amanda Cochran is a 89 y.o. female with medical history significant of osteoarthritis, depression, left CVA, glaucoma, hyperlipidemia, hypertension, left eye lacunar stroke, right hemiparesis, left basal ganglia infarct, hypothyroidism who was brought to the emergency department due to paranoid delusions.  She was screaming that there were people in her house.  One of her neighbors heard her scream and called EMS.  When seen, the patient was sleeping in NAD, but arousable.  She is unable to elaborate at the moment.  No complaints of pain or dyspnea at the moment.   Past Psychiatric History: UTA  Risk to Self:  UTA Risk to Others:  UTA Prior Inpatient Therapy:  UTA Prior Outpatient Therapy:  UTA  Past Medical History:  Past Medical History:  Diagnosis Date   Arthritis    Bladder infection    Blindness    CVA (cerebral infarction) 02/07/2014   Left basal ganglia infarct with right hemiparesis    Glaucoma    Glaucoma    Glaucoma (increased eye pressure)    Hyperlipidemia    Hypertension    Left sided lacunar stroke (HCC) 02/14/2014   Right hemiparesis (HCC) 02/09/2014   Left basal ganglia infarct Dec 2015    Stroke [facility])    Thyroid disease     Past Surgical History:  Procedure Laterality Date   cataract surgery Bilateral 2015   THYROID SURGERY  1976   TONSILLECTOMY AND ADENOIDECTOMY     VAGINAL PROLAPSE REPAIR     Family History:  Family History  Problem Relation Age of Onset   Heart failure Mother  AAA (abdominal aortic aneurysm) Father    Family Psychiatric  History: UTA Social History:  Social History   Substance and Sexual Activity  Alcohol Use No     Social History   Substance and Sexual Activity  Drug Use No    Social History   Socioeconomic History   Marital status: Widowed    Spouse name: deceased   Number of children: 0   Years of education: 14   Highest education level: Not on file  Occupational History   Occupation: retired    Comment: Print production planner   Tobacco Use   Smoking status: Never   Smokeless tobacco: Never  Vaping Use   Vaping Use: Never used  Substance and Sexual Activity   Alcohol use: No   Drug use: No   Sexual activity: Not on file  Other Topics Concern   Not on file  Social History Narrative   Widowed, lives alone   Caffeine use- 1 cup coffee daily   Right handed   No children   Social Determinants of Health   Financial Resource Strain: Not on file  Food Insecurity: No Food Insecurity (08/21/2022)   Hunger Vital Sign    Worried About Running Out of Food in the Last Year: Never true    Ran Out of Food in the Last Year: Never true  Transportation Needs: No Transportation Needs (08/21/2022)   PRAPARE - Administrator, Civil Service (Medical): No    Lack of Transportation (Non-Medical): No  Recent Concern: Transportation Needs - Unmet Transportation Needs (08/20/2022)   PRAPARE - Administrator, Civil Service (Medical): Yes    Lack of Transportation (Non-Medical): Yes  Physical Activity: Not on file  Stress: Not on file  Social Connections: Not on file   Additional Social History:    Allergies:  No Known Allergies  Labs:  Results for orders placed or performed during the hospital encounter of 08/19/22 (from the past 48 hour(s))  CBC with Differential/Platelet     Status: Abnormal   Collection Time: 08/23/22  8:10 AM  Result Value Ref Range   WBC 7.6 4.0 - 10.5 K/uL   RBC 4.47 3.87 - 5.11 MIL/uL   Hemoglobin 13.1 12.0 - 15.0 g/dL   HCT 16.1 09.6 - 04.5 %   MCV 93.5 80.0 - 100.0 fL   MCH 29.3 26.0 - 34.0 pg   MCHC 31.3 30.0 - 36.0 g/dL   RDW 40.9 (H) 81.1 - 91.4 %   Platelets 199 150 - 400 K/uL   nRBC 0.0 0.0 - 0.2 %   Neutrophils Relative % 65 %   Neutro Abs 5.0 1.7 - 7.7 K/uL   Lymphocytes Relative 23 %   Lymphs Abs 1.8 0.7 - 4.0 K/uL   Monocytes Relative 7 %   Monocytes Absolute 0.5 0.1 - 1.0 K/uL   Eosinophils Relative 3 %   Eosinophils Absolute 0.2 0.0 - 0.5 K/uL    Basophils Relative 2 %   Basophils Absolute 0.1 0.0 - 0.1 K/uL   Immature Granulocytes 0 %   Abs Immature Granulocytes 0.02 0.00 - 0.07 K/uL    Comment: Performed at [facility], [address], [place], [place] [address]  Comprehensive metabolic panel     Status: Abnormal   Collection Time: 08/23/22  8:10 AM  Result Value Ref Range   Sodium 139 135 - 145 mmol/L   Potassium 3.7 3.5 - 5.1 mmol/L   Chloride 106 98 - 111 mmol/L  CO2 22 22 - 32 mmol/L   Glucose, Bld 115 (H) 70 - 99 mg/dL    Comment: Glucose reference range applies only to samples taken after fasting for at least 8 hours.   BUN 29 (H) 8 - 23 mg/dL   Creatinine, Ser 2.95 (H) 0.44 - 1.00 mg/dL   Calcium 8.6 (L) 8.9 - 10.3 mg/dL   Total Protein 6.2 (L) 6.5 - 8.1 g/dL   Albumin 3.2 (L) 3.5 - 5.0 g/dL   AST 22 15 - 41 U/L   ALT 23 0 - 44 U/L   Alkaline Phosphatase 49 38 - 126 U/L   Total Bilirubin 0.7 0.3 - 1.2 mg/dL   GFR, Estimated 46 (L) >60 mL/min    Comment: (NOTE) Calculated using the CKD-EPI Creatinine Equation (2021)    Anion gap 11 5 - 15    Comment: Performed at [facility], [address] [place], [place] [address]  Magnesium     Status: None   Collection Time: 08/23/22  8:10 AM  Result Value Ref Range   Magnesium 2.1 1.7 - 2.4 mg/dL    Comment: Performed at [facility], [address], [place], [place] [address]  CBC with Differential/Platelet     Status: Abnormal   Collection Time: 08/24/22 10:55 AM  Result Value Ref Range   WBC 10.1 4.0 - 10.5 K/uL   RBC 4.81 3.87 - 5.11 MIL/uL   Hemoglobin 14.0 12.0 - 15.0 g/dL   HCT 46.9 62.9 - 52.8 %   MCV 93.1 80.0 - 100.0 fL   MCH 29.1 26.0 - 34.0 pg   MCHC 31.3 30.0 - 36.0 g/dL   RDW 41.3 (H) 24.4 - 01.0 %   Platelets 211 150 - 400 K/uL   nRBC 0.0 0.0 - 0.2 %   Neutrophils Relative % 72 %   Neutro Abs 7.2 1.7 - 7.7 K/uL   Lymphocytes Relative 23 %   Lymphs Abs 2.3 0.7 - 4.0 K/uL   Monocytes  Relative 4 %   Monocytes Absolute 0.4 0.1 - 1.0 K/uL   Eosinophils Relative 0 %   Eosinophils Absolute 0.0 0.0 - 0.5 K/uL   Basophils Relative 1 %   Basophils Absolute 0.1 0.0 - 0.1 K/uL   Immature Granulocytes 0 %   Abs Immature Granulocytes 0.03 0.00 - 0.07 K/uL    Comment: Performed at [facility], [address] [place], [place] [address]  Comprehensive metabolic panel     Status: Abnormal   Collection Time: 08/24/22 10:55 AM  Result Value Ref Range   Sodium 141 135 - 145 mmol/L   Potassium 3.9 3.5 - 5.1 mmol/L   Chloride 107 98 - 111 mmol/L   CO2 24 22 - 32 mmol/L   Glucose, Bld 115 (H) 70 - 99 mg/dL    Comment: Glucose reference range applies only to samples taken after fasting for at least 8 hours.   BUN 31 (H) 8 - 23 mg/dL   Creatinine, Ser 6.64 (H) 0.44 - 1.00 mg/dL   Calcium 8.8 (L) 8.9 - 10.3 mg/dL   Total Protein 6.7 6.5 - 8.1 g/dL   Albumin 3.4 (L) 3.5 - 5.0 g/dL   AST 28 15 - 41 U/L   ALT 29 0 - 44 U/L   Alkaline Phosphatase 53 38 - 126 U/L   Total Bilirubin 0.8 0.3 - 1.2 mg/dL   GFR, Estimated 37 (L) >60 mL/min    Comment: (NOTE) Calculated using the  CKD-EPI Creatinine Equation (2021)    Anion gap 10 5 - 15    Comment: Performed at [facility], [address] [place], [place] [address]  Magnesium     Status: None   Collection Time: 08/24/22 10:55 AM  Result Value Ref Range   Magnesium 2.0 1.7 - 2.4 mg/dL    Comment: Performed at [facility], [address] [place], [place] [address]  Procalcitonin     Status: None   Collection Time: 08/24/22 10:55 AM  Result Value Ref Range   Procalcitonin <0.10 ng/mL    Comment:        Interpretation: PCT (Procalcitonin) <= 0.5 ng/mL: Systemic infection (sepsis) is not likely. Local bacterial infection is possible. (NOTE)       Sepsis PCT Algorithm           Lower Respiratory Tract                                      Infection PCT Algorithm     ----------------------------     ----------------------------         PCT < 0.25 ng/mL                PCT < 0.10 ng/mL          Strongly encourage             Strongly discourage   discontinuation of antibiotics    initiation of antibiotics    ----------------------------     -----------------------------       PCT 0.25 - 0.50 ng/mL            PCT 0.10 - 0.25 ng/mL               OR       >80% decrease in PCT            Discourage initiation of                                            antibiotics      Encourage discontinuation           of antibiotics    ----------------------------     -----------------------------         PCT >= 0.50 ng/mL              PCT 0.26 - 0.50 ng/mL               AND        <80% decrease in PCT             Encourage initiation of                                             antibiotics       Encourage continuation           of antibiotics    ----------------------------     -----------------------------        PCT >= 0.50 ng/mL                  PCT > 0.50 ng/mL  AND         increase in PCT                  Strongly encourage                                      initiation of antibiotics    Strongly encourage escalation           of antibiotics                                     -----------------------------                                           PCT <= 0.25 ng/mL                                                 OR                                        > 80% decrease in PCT                                      Discontinue / Do not initiate                                             antibiotics  Performed at [facility], [address], [place], [place] [address]     Current Facility-Administered Medications  Medication Dose Route Frequency Provider Last Rate Last Admin   acetaminophen (TYLENOL) tablet 650 mg  650 mg Oral Q6H PRN Howerter, Justin B, DO       Or   acetaminophen (TYLENOL) suppository 650 mg  650  mg Rectal Q6H PRN Howerter, Justin B, DO       amoxicillin-clavulanate (AUGMENTIN) 500-125 MG per tablet 1 tablet  1 tablet Oral BID Shalhoub, Deno Lunger, MD       apixaban Everlene Balls) tablet 5 mg  5 mg Oral BID Linwood Dibbles, MD   5 mg at 08/24/22 1040   cholecalciferol (VITAMIN D3) 25 MCG (1000 UNIT) tablet 2,000 Units  2,000 Units Oral Daily Linwood Dibbles, MD   2,000 Units at 08/24/22 1040   dorzolamide-timolol (COSOPT) 2-0.5 % ophthalmic solution 1 drop  1 drop Both Eyes BID Linwood Dibbles, MD   1 drop at 08/24/22 1041   doxycycline (VIBRA-TABS) tablet 100 mg  100 mg Oral Q12H Marinda Elk, MD   100 mg at 08/24/22 1040   hydrALAZINE (APRESOLINE) injection 10 mg  10 mg Intravenous Q6H PRN Shalhoub, Deno Lunger, MD       hydrOXYzine (ATARAX) tablet 25 mg  25 mg Oral Q6H PRN Shalhoub, Deno Lunger, MD       latanoprost (XALATAN) 0.005 % ophthalmic solution  1 drop  1 drop Both Eyes QHS Linwood Dibbles, MD   1 drop at 08/22/22 2011   levothyroxine (SYNTHROID) tablet 100 mcg  100 mcg Oral Q0600 Linwood Dibbles, MD   100 mcg at 08/21/22 0610   melatonin tablet 10 mg  10 mg Oral QHS PRN Shalhoub, Deno Lunger, MD       metoprolol succinate (TOPROL-XL) 24 hr tablet 50 mg  50 mg Oral Daily Gonfa, Taye T, MD   50 mg at 08/24/22 1053   ondansetron (ZOFRAN) injection 4 mg  4 mg Intravenous Q6H PRN Howerter, Justin B, DO   4 mg at 08/24/22 1149   pilocarpine (PILOCAR) 1 % ophthalmic solution 1 drop  1 drop Both Eyes QID Linwood Dibbles, MD   1 drop at 08/24/22 1041   QUEtiapine (SEROQUEL) tablet 12.5 mg  12.5 mg Oral BID PRN Candelaria Stagers T, MD   12.5 mg at 08/24/22 0351   thiamine (VITAMIN B1) tablet 100 mg  100 mg Oral Daily Candelaria Stagers T, MD   100 mg at 08/24/22 1040   valproate (DEPACON) 250 mg in dextrose 5 % 50 mL IVPB  250 mg Intravenous Q12H Maryagnes Amos, FNP 52.5 mL/hr at 08/24/22 1201 250 mg at 08/24/22 1201    Musculoskeletal: Strength & Muscle Tone: within normal limits Gait & Station:  UTA Patient leans:  N/A            Psychiatric Specialty Exam:  Presentation  General Appearance:  Appropriate for Environment; Casual  Eye Contact: Minimal  Speech: Clear and Coherent; Slow  Speech Volume: Increased  Handedness: Right   Mood and Affect  Mood: Euthymic  Affect: Appropriate; Congruent   Thought Process  Thought Processes: Disorganized  Descriptions of Associations:Loose  Orientation:Other (comment) (name only)  Thought Content:Scattered  History of Schizophrenia/Schizoaffective disorder:No data recorded Duration of Psychotic Symptoms:No data recorded Hallucinations:Hallucinations: None  Ideas of Reference:None  Suicidal Thoughts:Suicidal Thoughts: No  Homicidal Thoughts:Homicidal Thoughts: No   Sensorium  Memory: Immediate Poor; Recent Poor; Remote Poor  Judgment: Impaired  Insight: Shallow   Executive Functions  Concentration: Poor  Attention Span: Poor  Recall: Poor  Fund of Knowledge: Poor  Language: Fair   Psychomotor Activity  Psychomotor Activity: Psychomotor Activity: Normal   Assets  Assets: Communication Skills; Social Support; Housing; Desire for Improvement   Sleep  Sleep: Sleep: Fair   Physical Exam: Physical Exam Vitals and nursing note reviewed.  Constitutional:      Appearance: Normal appearance. She is normal weight.  HENT:     Head: Normocephalic.  Skin:    General: Skin is warm and dry.  Neurological:     General: No focal deficit present.     Mental Status: She is alert and oriented to person, place, and time.  Psychiatric:        Attention and Perception: Perception normal. She is inattentive.        Mood and Affect: Mood and affect normal.        Speech: Speech normal.        Behavior: Behavior normal. Behavior is cooperative.        Thought Content: Thought content is paranoid and delusional.        Cognition and Memory: Cognition is impaired. Memory is impaired. She exhibits  impaired recent memory and impaired remote memory.        Judgment: Judgment normal.    Review of Systems  Psychiatric/Behavioral: Negative.    All other systems reviewed and  are negative.  Blood pressure (!) 148/71, pulse 63, temperature 97.6 F (36.4 C), temperature source Oral, resp. rate 20, height 5\' 5"  (1.651 m), weight 64.4 kg, SpO2 93 %. Body mass index is 23.63 kg/m.  Treatment Plan Summary: Plan    Patient likely has ongoing delirium which presents as fluctuating cognition.  At time of this assessment patient does not appear to present with any underlying psychiatry diagnosis related to hallucinations. Suspect this is 2/t MCI. However patient also unable to participate in meaningful evaluation to determine level of severity due to visual, hearing and cognitive impairments.   The patient's exam is notable for altered sensorium, perceptual disturbances, disorientation and cognitive deficits however do not know her baseline. Will call this delirium for now although I do suspect dementia at baseline.  Virtually any medical condition or physiologic stress can precipitate delirium in a susceptible individual, with risk increasing in those with: advanced age, sensory impairments, organic brain disease (stroke, dementia, Parkinsons), psychiatric illness, major chronic medical issues, prolonged hospitalizations, postoperative status, anemia, insomnia/disturbed sleep, and severe pain. Addressing the underlying medical condition and institution of preventative measures are recommended.  - Continue to monitor and treat underlying medical causes of delirium, including infection, electrolyte disturbances, etc. - Delirium precautions - Minimize/avoid deliriogenic meds including: anticholinergic, opiates, benzodiazepines           - Maintain hydration, oxygenation, nutrition           - Limit use of restraints and catheters           - Normalize sleep patterns by minimizing nighttime noise, light  and interruptions by     -Ensure sleep apnea treatment is provided overnight.             clustering care, opening blinds during the day           - Reorient the patient frequently, provide easily visible clock and calendar           - Provide sensory aids like glasses, hearing aids           - Encourage ambulation, regular activities and visitors to maintain cognitive stimulation   -Patient would benefit from having family members at bedside to reinforce her orientation.   No pre-existing history of DX dementia noted in her chart. However chart review shows 08/21 Neurology visit for memory loss deficit. At this appointment she was concerned about dementia, memory loss, word finding, completion of sentences, declined aricept at that time following her stroke in 12/2018. Recommend she continue Elquis for stroke prevention (history of 2). Due to poor vision unable to complete cognitive evaluation and memory testing (soduku) etc. CT negative for acute stroke. Recommend MRI of head wo contrast.    Labs reviewed and assessed at this time. UA + nitrites and Urine culture (e.coli). Subclinical hypothyroidism deferred to Primary team at this time. TSH (6.142) and Free 4 (1.45). B12 (665), Vitamin D (48.09), Vitamin B1 (115.2), CRP (1.1).   UTI has been treated with rocephin upon admission. Her cognitive impairment symptoms may lag behind her clinical resolution.   WIll start Depacon 250mg  IV q12 hour, will not treat to therapeutic goal (80-100), more so symptom management of agitation, psychosis related to her behaviors.   At this time she does not need a 1:1 Recruitment consultant however may benefit from a tele sitter at night or around evening.   Although she displayed no agitation recently consider administering seroquel around 4/5 in the afternoon.   Psych consult  will assess x 1 for side effect and efficacy of Depacon. As for now there are no barriers to discharging this patient home.   Disposition: No  evidence of imminent risk to self or others at present.   Patient does not meet criteria for psychiatric inpatient admission. Supportive therapy provided about ongoing stressors. Refer to IOP. Discussed crisis plan, support from social network, calling 911, coming to the Emergency Department, and calling Suicide Hotline.  Maryagnes Amos, FNP 08/24/2022 3:09 PM

## 2022-08-24 NOTE — Progress Notes (Signed)
PROGRESS NOTE   Amanda Cochran  EXB:[id number] DOB: [birth year] DOA: 08/19/2022 PCP: [name], MD   Date of Service: the patient was seen and examined on 08/24/2022  Brief Narrative:  89 y.o. female with medical history significant of open-angle glaucoma of both eyes with legal blindness, CVA with some residual right-sided weakness (uses a cane to ambulate), hyperlipidemia, hypertension, atrial fibrillation on Eliquis, hypothyroidism who was brought to the emergency department after she was found screaming in her house at "intruders."    Patient was felt to be suffering from acute metabolic encephalopathy secondary to possible urinary tract infection.  The hospitalist group was called to assess the patient for admission to the hospital.    Patient was placed on intravenous antibiotics with ceftriaxone.  During this hospitalization, echocardiogram revealed markedly low ejection fraction of 30 to 35% with indeterminate diastolic function and.  Patient did not appear to be in acute heart failure.  Case was discussed with cardiology and home regimen of beta-blocker therapy was titrated.     Assessment and Plan: E-coli UTI Patient now receiving Augmentin to complete a total of 7 days of therapy for treatment of urinary tract infection and possible left lower lobe pneumonia. Unclear as to whether there has been any clinical improvement with treatment Urine cultures have grown out E. coli on admission  Pneumonia of left lower lobe due to infectious organism Chest x-ray does reveal a left lower lobe atelectasis versus infiltrate.   Unlikely that this is truly pneumonia however treating in an attempt to reverse patient's confusion and agitation. Treating with combination of Augmentin (for total of 7 day course)  and doxycycline (for total of 5 day course)   Chronic systolic CHF (congestive heart failure) (HCC) Echocardiogram 6/27 revealing newly depressed ejection fraction of 30 to 35% No focal  hypokinesis on echo. No clinical evidence of volume overload. Depressed EF may be secondary to poorly managed atrial fibrillation and therefore titrating beta-blocker therapy to ensure atrial fibrillation is better controlled   Paranoid delusion (HCC) Continued bouts of agitation and paranoid delusions.  Requested that psychiatry evaluate patient today.  They feel that patient's symptoms are mostly very to delirium secondary to her current illness or possibly progression of underlying dementia.  Their input is appreciated. Depakote recommended by psychiatry and initiated today.  VBG, ammonia, RPR, TSH, vitamin B12, urine drug screen and CT imaging of the head all unremarkable Patient possibly suffering from Lewy body dementia, undiagnosed psychiatric illness less likely.   Acute renal failure superimposed on stage 3a chronic kidney disease (HCC) Strict intake and output monitoring Gentle intermittent intravenous hydration Serial chemistries to monitor renal function and electrolytes   Atrial fibrillation, chronic (HCC) Continue home regimen of apixaban Now on Toprol XL 50 mg daily  Essential hypertension Continue metoprolol  Relaxed blood pressure targets due to advanced age.  Hypothyroidism Minimally elevated TSH Question consistent compliance with Synthroid in the outpatient setting.  Will continue current outpatient regimen of 100 mcg daily at this time.     Subjective:  Patient continues to repeat that she has to leave the hospital to check on her home and "take care of things."  Patient denies any pain.  Physical Exam:  Vitals:   08/23/22 1355 08/24/22 0609 08/24/22 1053 08/24/22 1454  BP: (!) 160/137 (!) 145/74 (!) 174/92 (!) 148/71  Pulse: 84 98 78 63  Resp:  18  20  Temp: 97.9 F (36.6 C) 97.6 F (36.4 C)  97.6 F (36.4 C)  TempSrc: Oral Oral  Oral  SpO2: 96% 95%  93%  Weight:      Height:        Constitutional: Awake alert and oriented x 2, mild  agitation Skin: no rashes, no lesions, or skin turgor noted. Eyes: Pupils are equally reactive to light.  No evidence of scleral icterus or conjunctival pallor.  ENMT: Moist mucous membranes noted.  Posterior pharynx clear of any exudate or lesions.   Respiratory: clear to auscultation bilaterally, no wheezing, no crackles. Normal respiratory effort. No accessory muscle use.  Cardiovascular: Regular rate and rhythm, no murmurs / rubs / gallops. No extremity edema. 2+ pedal pulses. No carotid bruits.  Abdomen: Abdomen is soft and nontender.  No evidence of intra-abdominal masses.  Positive bowel sounds noted in all quadrants.   Musculoskeletal: No joint deformity upper and lower extremities. Good ROM, no contractures. Normal muscle tone.  Psych: Patient continues to exhibit an angry mood.  Patient does not seem to currently possess insight as to her current situation.  Data Reviewed:  I have personally reviewed and interpreted labs, imaging.  Significant findings are   CBC: Recent Labs  Lab 08/19/22 2005 08/20/22 0739 08/21/22 0845 08/22/22 0910 08/23/22 0810 08/24/22 1055  WBC 11.1* 9.3 7.6 8.2 7.6 10.1  NEUTROABS 8.4* 7.0  --  5.9 5.0 7.2  HGB 13.5 14.0 13.9 13.8 13.1 14.0  HCT 43.1 43.6 44.7 45.1 41.8 44.8  MCV 91.7 91.4 92.7 94.5 93.5 93.1  PLT 181 195 199 141* 199 211    Basic Metabolic Panel: Recent Labs  Lab 08/20/22 0739 08/21/22 0845 08/22/22 0910 08/23/22 0810 08/24/22 1055  NA 139 140 139 139 141  K 3.4* 3.9 3.9 3.7 3.9  CL 105 104 104 106 107  CO2 24 25 22 22 24   GLUCOSE 109* 122* 122* 115* 115*  BUN 29* 29* 25* 29* 31*  CREATININE 1.20* 1.23* 1.07* 1.13* 1.36*  CALCIUM 9.0 8.6* 9.0 8.6* 8.8*  MG 1.9 2.1 1.9 2.1 2.0  PHOS  --  3.4  --   --   --     GFR: Estimated Creatinine Clearance: 24.2 mL/min (A) (by C-G formula based on SCr of 1.36 mg/dL (H)). Liver Function Tests: Recent Labs  Lab 08/19/22 2005 08/20/22 0739 08/21/22 0845 08/22/22 0910  08/23/22 0810 08/24/22 1055  AST 37 34  --  24 22 28   ALT 25 22  --  22 23 29   ALKPHOS 46 46  --  45 49 53  BILITOT 1.3* 1.2  --  1.0 0.7 0.8  PROT 7.0 6.6  --  6.9 6.2* 6.7  ALBUMIN 3.8 3.6 3.4* 3.3* 3.2* 3.4*      Code Status:  Full code.    Severity of Illness:  The appropriate patient status for this patient is INPATIENT. Inpatient status is judged to be reasonable and necessary in order to provide the required intensity of service to ensure the patient's safety. The patient's presenting symptoms, physical exam findings, and initial radiographic and laboratory data in the context of their chronic comorbidities is felt to place them at high risk for further clinical deterioration. Furthermore, it is not anticipated that the patient will be medically stable for discharge from the hospital within 2 midnights of admission.   * I certify that at the point of admission it is my clinical judgment that the patient will require inpatient hospital care spanning beyond 2 midnights from the point of admission due to high intensity of service, high  risk for further deterioration and high frequency of surveillance required.*  Time spent:  51 minutes  Author:  Marinda Elk MD  08/24/2022 9:47 PM

## 2022-08-24 NOTE — Progress Notes (Signed)
PT Note  Patient Details Name: Amanda Cochran MRN: [id number] DOB: [birth year]   Cancelled Treatment:    Reason Eval/Treat Not Completed: Fatigue/lethargy limiting ability to participate. PT returned later in the afternoon and pt resting in bed, pt easily roused and reported she had vomited and she needed to be cleaned up. PT assisted pt to communicate with nursing staff and nurse arrived and indicated pt had already been assisted with ADLs. Pt declined therapy intervention at this time.   [name], PT Acute Rehab  [name] 08/24/2022, 1:44 PM

## 2022-08-25 DIAGNOSIS — G9341 Metabolic encephalopathy: Secondary | ICD-10-CM | POA: Diagnosis not present

## 2022-08-25 DIAGNOSIS — N390 Urinary tract infection, site not specified: Secondary | ICD-10-CM | POA: Diagnosis not present

## 2022-08-25 DIAGNOSIS — J189 Pneumonia, unspecified organism: Secondary | ICD-10-CM | POA: Diagnosis not present

## 2022-08-25 DIAGNOSIS — I5022 Chronic systolic (congestive) heart failure: Secondary | ICD-10-CM | POA: Diagnosis not present

## 2022-08-25 LAB — CBC WITH DIFFERENTIAL/PLATELET
Abs Immature Granulocytes: 0.02 10*3/uL (ref 0.00–0.07)
Basophils Absolute: 0.1 10*3/uL (ref 0.0–0.1)
Basophils Relative: 1 %
Eosinophils Absolute: 0.1 10*3/uL (ref 0.0–0.5)
Eosinophils Relative: 1 %
HCT: 42.3 % (ref 36.0–46.0)
Hemoglobin: 13.1 g/dL (ref 12.0–15.0)
Immature Granulocytes: 0 %
Lymphocytes Relative: 24 %
Lymphs Abs: 2.2 10*3/uL (ref 0.7–4.0)
MCH: 29 pg (ref 26.0–34.0)
MCHC: 31 g/dL (ref 30.0–36.0)
MCV: 93.8 fL (ref 80.0–100.0)
Monocytes Absolute: 0.5 10*3/uL (ref 0.1–1.0)
Monocytes Relative: 6 %
Neutro Abs: 6.4 10*3/uL (ref 1.7–7.7)
Neutrophils Relative %: 68 %
Platelets: 195 10*3/uL (ref 150–400)
RBC: 4.51 MIL/uL (ref 3.87–5.11)
RDW: 17.3 % — ABNORMAL HIGH (ref 11.5–15.5)
WBC: 9.3 10*3/uL (ref 4.0–10.5)
nRBC: 0 % (ref 0.0–0.2)

## 2022-08-25 LAB — COMPREHENSIVE METABOLIC PANEL
ALT: 23 U/L (ref 0–44)
AST: 22 U/L (ref 15–41)
Albumin: 3 g/dL — ABNORMAL LOW (ref 3.5–5.0)
Alkaline Phosphatase: 46 U/L (ref 38–126)
Anion gap: 8 (ref 5–15)
BUN: 31 mg/dL — ABNORMAL HIGH (ref 8–23)
CO2: 25 mmol/L (ref 22–32)
Calcium: 8.5 mg/dL — ABNORMAL LOW (ref 8.9–10.3)
Chloride: 106 mmol/L (ref 98–111)
Creatinine, Ser: 1.35 mg/dL — ABNORMAL HIGH (ref 0.44–1.00)
GFR, Estimated: 37 mL/min — ABNORMAL LOW (ref >60)
Glucose, Bld: 105 mg/dL — ABNORMAL HIGH (ref 70–99)
Potassium: 3.8 mmol/L (ref 3.5–5.1)
Sodium: 139 mmol/L (ref 135–145)
Total Bilirubin: 0.8 mg/dL (ref 0.3–1.2)
Total Protein: 6.1 g/dL — ABNORMAL LOW (ref 6.5–8.1)

## 2022-08-25 LAB — AMMONIA: Ammonia: 16 umol/L (ref 9–35)

## 2022-08-25 LAB — MAGNESIUM: Magnesium: 1.8 mg/dL (ref 1.7–2.4)

## 2022-08-25 MED ORDER — DIVALPROEX SODIUM 125 MG PO CSDR
125.0000 mg | DELAYED_RELEASE_CAPSULE | Freq: Three times a day (TID) | ORAL | Status: DC
  Administered 2022-08-26 – 2022-09-16 (×62): 125 mg via ORAL
  Filled 2022-08-25 (×64): qty 1

## 2022-08-25 MED ORDER — DIVALPROEX SODIUM 125 MG PO CSDR: 125.0000 mg | DELAYED_RELEASE_CAPSULE | Freq: Three times a day (TID) | ORAL | Status: DC

## 2022-08-25 MED ORDER — VALPROATE SODIUM 100 MG/ML IV SOLN
250.0000 mg | Freq: Two times a day (BID) | INTRAVENOUS | Status: AC
  Administered 2022-08-25 – 2022-08-26 (×2): 250 mg via INTRAVENOUS
  Filled 2022-08-25 (×2): qty 2.5

## 2022-08-25 NOTE — Progress Notes (Incomplete)
PROGRESS NOTE   Amanda Cochran  WUJ:[id number] DOB: 1928/09/28 DOA: 08/19/2022 PCP: [name], MD   Date of Service: the patient was seen and examined on 08/25/2022  Brief Narrative:  89 y.o. female with medical history significant of open-angle glaucoma of both eyes with legal blindness, CVA with some residual right-sided weakness (uses a cane to ambulate), hyperlipidemia, hypertension, atrial fibrillation on Eliquis, hypothyroidism who was brought to the emergency department after she was found screaming in her house at "intruders."    Patient was felt to be suffering from acute metabolic encephalopathy secondary to possible urinary tract infection.  The hospitalist group was called to assess the patient for admission to the hospital.    Patient was placed on intravenous antibiotics with ceftriaxone.  During this hospitalization, echocardiogram revealed markedly low ejection fraction of 30 to 35% with indeterminate diastolic function and.  Patient did not appear to be in acute heart failure.  Case was discussed with cardiology and home regimen of beta-blocker therapy was titrated.     Assessment and Plan: E-coli UTI Patient now receiving Augmentin to complete a total of 7 days of therapy for treatment of urinary tract infection and possible left lower lobe pneumonia. Unclear as to whether there has been any clinical improvement with treatment Urine cultures have grown out E. coli on admission  Pneumonia of left lower lobe due to infectious organism Chest x-ray does reveal a left lower lobe atelectasis versus infiltrate.   Unlikely that this is truly pneumonia however treating in an attempt to reverse patient's confusion and agitation. Treating with combination of Augmentin (for total of 7 day course)  and doxycycline (for total of 5 day course)   Chronic systolic CHF (congestive heart failure) (HCC) Echocardiogram 6/27 revealing newly depressed ejection fraction of 30 to 35% No focal  hypokinesis on echo. No clinical evidence of volume overload. Depressed EF may be secondary to poorly managed atrial fibrillation and therefore titrating beta-blocker therapy to ensure atrial fibrillation is better controlled   Paranoid delusion (HCC) Continued bouts of agitation and paranoid delusions.  Requested that psychiatry evaluate patient today.  They feel that patient's symptoms are mostly very to delirium secondary to her current illness or possibly progression of underlying dementia.  Their input is appreciated. Depakote recommended by psychiatry and initiated today.  VBG, ammonia, RPR, TSH, vitamin B12, urine drug screen and CT imaging of the head all unremarkable Patient possibly suffering from Lewy body dementia, undiagnosed psychiatric illness less likely.   Acute renal failure superimposed on stage 3a chronic kidney disease (HCC) Strict intake and output monitoring Gentle intermittent intravenous hydration Serial chemistries to monitor renal function and electrolytes   Atrial fibrillation, chronic (HCC) Continue home regimen of apixaban Now on Toprol XL 50 mg daily  Essential hypertension Continue metoprolol  Relaxed blood pressure targets due to advanced age.  Hypothyroidism Minimally elevated TSH Question consistent compliance with Synthroid in the outpatient setting.  Will continue current outpatient regimen of 100 mcg daily at this time.     Subjective:  Patient continues to repeat that she has to leave the hospital to check on her home and "take care of things."  Patient denies any pain.  Physical Exam:  Vitals:   08/24/22 1454 08/24/22 2231 08/25/22 0652 08/25/22 0652  BP: (!) 148/71 (!) 153/88  139/67  Pulse: 63 78  66  Resp: 20 18  18   Temp: 97.6 F (36.4 C)   (!) 97.5 F (36.4 C)  TempSrc: Oral  Oral  SpO2: 93% 97%  96%  Weight:   65.3 kg   Height:        Constitutional: Awake alert and oriented x 2, mild agitation Skin: no rashes, no  lesions, or skin turgor noted. Eyes: Pupils are equally reactive to light.  No evidence of scleral icterus or conjunctival pallor.  ENMT: Moist mucous membranes noted.  Posterior pharynx clear of any exudate or lesions.   Respiratory: clear to auscultation bilaterally, no wheezing, no crackles. Normal respiratory effort. No accessory muscle use.  Cardiovascular: Regular rate and rhythm, no murmurs / rubs / gallops. No extremity edema. 2+ pedal pulses. No carotid bruits.  Abdomen: Abdomen is soft and nontender.  No evidence of intra-abdominal masses.  Positive bowel sounds noted in all quadrants.   Musculoskeletal: No joint deformity upper and lower extremities. Good ROM, no contractures. Normal muscle tone.  Psych: Patient continues to exhibit an angry mood.  Patient does not seem to currently possess insight as to her current situation.  Data Reviewed:  I have personally reviewed and interpreted labs, imaging.  Significant findings are   CBC: Recent Labs  Lab 08/20/22 0739 08/21/22 0845 08/22/22 0910 08/23/22 0810 08/24/22 1055 08/25/22 0606  WBC 9.3 7.6 8.2 7.6 10.1 9.3  NEUTROABS 7.0  --  5.9 5.0 7.2 6.4  HGB 14.0 13.9 13.8 13.1 14.0 13.1  HCT 43.6 44.7 45.1 41.8 44.8 42.3  MCV 91.4 92.7 94.5 93.5 93.1 93.8  PLT 195 199 141* 199 211 195    Basic Metabolic Panel: Recent Labs  Lab 08/21/22 0845 08/22/22 0910 08/23/22 0810 08/24/22 1055 08/25/22 0606  NA 140 139 139 141 139  K 3.9 3.9 3.7 3.9 3.8  CL 104 104 106 107 106  CO2 25 22 22 24 25   GLUCOSE 122* 122* 115* 115* 105*  BUN 29* 25* 29* 31* 31*  CREATININE 1.23* 1.07* 1.13* 1.36* 1.35*  CALCIUM 8.6* 9.0 8.6* 8.8* 8.5*  MG 2.1 1.9 2.1 2.0 1.8  PHOS 3.4  --   --   --   --     GFR: Estimated Creatinine Clearance: 24.4 mL/min (A) (by C-G formula based on SCr of 1.35 mg/dL (H)). Liver Function Tests: Recent Labs  Lab 08/20/22 0739 08/21/22 0845 08/22/22 0910 08/23/22 0810 08/24/22 1055 08/25/22 0606  AST  34  --  24 22 28 22   ALT 22  --  22 23 29 23   ALKPHOS 46  --  45 49 53 46  BILITOT 1.2  --  1.0 0.7 0.8 0.8  PROT 6.6  --  6.9 6.2* 6.7 6.1*  ALBUMIN 3.6 3.4* 3.3* 3.2* 3.4* 3.0*      Code Status:  Full code.    Severity of Illness:  The appropriate patient status for this patient is INPATIENT. Inpatient status is judged to be reasonable and necessary in order to provide the required intensity of service to ensure the patient's safety. The patient's presenting symptoms, physical exam findings, and initial radiographic and laboratory data in the context of their chronic comorbidities is felt to place them at high risk for further clinical deterioration. Furthermore, it is not anticipated that the patient will be medically stable for discharge from the hospital within 2 midnights of admission.   * I certify that at the point of admission it is my clinical judgment that the patient will require inpatient hospital care spanning beyond 2 midnights from the point of admission due to high intensity of service, high risk for further  deterioration and high frequency of surveillance required.*  Time spent:  51 minutes  Author:  Marinda Elk MD  08/25/2022 10:01 AM

## 2022-08-25 NOTE — Progress Notes (Signed)
OT Cancellation Note  Patient Details Name: Amanda Cochran MRN: [id number] DOB: November 07, 1928   Cancelled Treatment:    Reason Eval/Treat Not Completed: Patient declined, no reason specified;Fatigue/lethargy limiting ability to participate Patient declined reporting she was sleepy and then she reported "I'm old and dying". OT to continue to follow and check back as schedule will allow.  Rosalio Loud, MS Acute Rehabilitation Department Office# [phone]  08/25/2022, 11:09 AM

## 2022-08-25 NOTE — Consult Note (Signed)
[facility] Face-to-Face Psychiatry Consult   Reason for Consult:  Persistin delusions, negative workup for encephalopathy Referring Physician:  Dr. Leafy Half Patient Identification: Amanda Cochran MRN:  [id number] Principal Diagnosis: Acute metabolic encephalopathy Diagnosis:  Active Problems:   Essential hypertension   Hyperlipidemia   Right hemiparesis (HCC)   Left sided lacunar stroke [facility])   Atrial fibrillation, chronic (HCC)   History of CVA (cerebrovascular accident)   MCI (mild cognitive impairment)   Primary open angle glaucoma of both eyes, severe stage   Delirium   Paranoid delusion (HCC)   Acute HFrEF (heart failure with reduced ejection fraction) (HCC)   Osteoarthritis   Acute renal failure superimposed on stage 3a chronic kidney disease (HCC)   Chronic systolic CHF (congestive heart failure) (HCC)   E-coli UTI   Hypothyroidism   Pneumonia of left lower lobe due to infectious organism   Total Time spent with patient: 20 minutes  Subjective:   Amanda Cochran is a 89 y.o. female patient admitted with paranoid delusions.   Patient presents today with slightly improved cognitive function. She is able to answer some questions and respond appropriately. She is alert and oriented x (self and place). She inquires about the time, and breakfast " What time is it and that means my breakfast is here." Patient does being to cry, noting she wants to go home. She states " I own my home." Attempted to inquire about medication adherence in the context of visual impairments, however she was unable to correlate this information. She did states she has support system, unable to answer if she received help with medication preparation or prepackaged meds etc. She does not appear to be delusional or paranoid on this evaluation. She does not appear to be responding to internal stimuli and external stimuli.   Overall there appears to be some improvement in her psychosis as aeb by ability to follow commands,  neuro intact, answer questions appropriately. Furthermore she does not appear to be displaying acutely psychotic symptoms, nor is there documentation by nursing staff in over 48 hours. She has not required any prns in over 24 hours. Patients initial baseline is not known, however it is felt she is returning to her baseline, she has some notable clinical improvement 2/t medication efficacy, treatment of infections, and electrolyte replacement. She denies si/hi/avh.   At this current time, patient's pre-existing condition that was substantially aggravated prior to this admission has returned to previous level. She is not lethargic on today's exam. At this time it is felt that patient has returned to psychiatric baseline, and will be stable to discharge home.  HPI:  Amanda Cochran is a 89 y.o. female with medical history significant of osteoarthritis, depression, left CVA, glaucoma, hyperlipidemia, hypertension, left eye lacunar stroke, right hemiparesis, left basal ganglia infarct, hypothyroidism who was brought to the emergency department due to paranoid delusions.  She was screaming that there were people in her house.  One of her neighbors heard her scream and called EMS.  When seen, the patient was sleeping in NAD, but arousable.  She is unable to elaborate at the moment.  No complaints of pain or dyspnea at the moment.   Past Psychiatric History: UTA  Risk to Self:  UTA Risk to Others:  UTA Prior Inpatient Therapy:  UTA Prior Outpatient Therapy:  UTA  Past Medical History:  Past Medical History:  Diagnosis Date   Arthritis    Bladder infection    Blindness    CVA (cerebral infarction)  02/07/2014   Left basal ganglia infarct with right hemiparesis    Glaucoma    Glaucoma    Glaucoma (increased eye pressure)    Hyperlipidemia    Hypertension    Left sided lacunar stroke (HCC) 02/14/2014   Right hemiparesis (HCC) 02/09/2014   Left basal ganglia infarct Dec 2015    Stroke [facility])    Thyroid  disease     Past Surgical History:  Procedure Laterality Date   cataract surgery Bilateral 2015   THYROID SURGERY  1976   TONSILLECTOMY AND ADENOIDECTOMY     VAGINAL PROLAPSE REPAIR     Family History:  Family History  Problem Relation Age of Onset   Heart failure Mother    AAA (abdominal aortic aneurysm) Father    Family Psychiatric  History: UTA Social History:  Social History   Substance and Sexual Activity  Alcohol Use No     Social History   Substance and Sexual Activity  Drug Use No    Social History   Socioeconomic History   Marital status: Widowed    Spouse name: deceased   Number of children: 0   Years of education: 14   Highest education level: Not on file  Occupational History   Occupation: retired    Comment: Print production planner  Tobacco Use   Smoking status: Never   Smokeless tobacco: Never  Vaping Use   Vaping Use: Never used  Substance and Sexual Activity   Alcohol use: No   Drug use: No   Sexual activity: Not on file  Other Topics Concern   Not on file  Social History Narrative   Widowed, lives alone   Caffeine use- 1 cup coffee daily   Right handed   No children   Social Determinants of Health   Financial Resource Strain: Not on file  Food Insecurity: No Food Insecurity (08/21/2022)   Hunger Vital Sign    Worried About Running Out of Food in the Last Year: Never true    Ran Out of Food in the Last Year: Never true  Transportation Needs: No Transportation Needs (08/21/2022)   PRAPARE - Administrator, Civil Service (Medical): No    Lack of Transportation (Non-Medical): No  Recent Concern: Transportation Needs - Unmet Transportation Needs (08/20/2022)   PRAPARE - Administrator, Civil Service (Medical): Yes    Lack of Transportation (Non-Medical): Yes  Physical Activity: Not on file  Stress: Not on file  Social Connections: Not on file   Additional Social History:    Allergies:  No Known Allergies  Labs:   Results for orders placed or performed during the hospital encounter of 08/19/22 (from the past 48 hour(s))  CBC with Differential/Platelet     Status: Abnormal   Collection Time: 08/24/22 10:55 AM  Result Value Ref Range   WBC 10.1 4.0 - 10.5 K/uL   RBC 4.81 3.87 - 5.11 MIL/uL   Hemoglobin 14.0 12.0 - 15.0 g/dL   HCT 13.0 86.5 - 78.4 %   MCV 93.1 80.0 - 100.0 fL   MCH 29.1 26.0 - 34.0 pg   MCHC 31.3 30.0 - 36.0 g/dL   RDW 69.6 (H) 29.5 - 28.4 %   Platelets 211 150 - 400 K/uL   nRBC 0.0 0.0 - 0.2 %   Neutrophils Relative % 72 %   Neutro Abs 7.2 1.7 - 7.7 K/uL   Lymphocytes Relative 23 %   Lymphs Abs 2.3 0.7 - 4.0 K/uL  Monocytes Relative 4 %   Monocytes Absolute 0.4 0.1 - 1.0 K/uL   Eosinophils Relative 0 %   Eosinophils Absolute 0.0 0.0 - 0.5 K/uL   Basophils Relative 1 %   Basophils Absolute 0.1 0.0 - 0.1 K/uL   Immature Granulocytes 0 %   Abs Immature Granulocytes 0.03 0.00 - 0.07 K/uL    Comment: Performed at [facility], [address] [place], [place] [address]  Comprehensive metabolic panel     Status: Abnormal   Collection Time: 08/24/22 10:55 AM  Result Value Ref Range   Sodium 141 135 - 145 mmol/L   Potassium 3.9 3.5 - 5.1 mmol/L   Chloride 107 98 - 111 mmol/L   CO2 24 22 - 32 mmol/L   Glucose, Bld 115 (H) 70 - 99 mg/dL    Comment: Glucose reference range applies only to samples taken after fasting for at least 8 hours.   BUN 31 (H) 8 - 23 mg/dL   Creatinine, Ser 6.04 (H) 0.44 - 1.00 mg/dL   Calcium 8.8 (L) 8.9 - 10.3 mg/dL   Total Protein 6.7 6.5 - 8.1 g/dL   Albumin 3.4 (L) 3.5 - 5.0 g/dL   AST 28 15 - 41 U/L   ALT 29 0 - 44 U/L   Alkaline Phosphatase 53 38 - 126 U/L   Total Bilirubin 0.8 0.3 - 1.2 mg/dL   GFR, Estimated 37 (L) >60 mL/min    Comment: (NOTE) Calculated using the CKD-EPI Creatinine Equation (2021)    Anion gap 10 5 - 15    Comment: Performed at [facility] [address] [place], [place]  [address]  Magnesium     Status: None   Collection Time: 08/24/22 10:55 AM  Result Value Ref Range   Magnesium 2.0 1.7 - 2.4 mg/dL    Comment: Performed at [facility], [address], [place], [place] [address]  Procalcitonin     Status: None   Collection Time: 08/24/22 10:55 AM  Result Value Ref Range   Procalcitonin <0.10 ng/mL    Comment:        Interpretation: PCT (Procalcitonin) <= 0.5 ng/mL: Systemic infection (sepsis) is not likely. Local bacterial infection is possible. (NOTE)       Sepsis PCT Algorithm           Lower Respiratory Tract                                      Infection PCT Algorithm    ----------------------------     ----------------------------         PCT < 0.25 ng/mL                PCT < 0.10 ng/mL          Strongly encourage             Strongly discourage   discontinuation of antibiotics    initiation of antibiotics    ----------------------------     -----------------------------       PCT 0.25 - 0.50 ng/mL            PCT 0.10 - 0.25 ng/mL               OR       >80% decrease in PCT            Discourage initiation of  antibiotics      Encourage discontinuation           of antibiotics    ----------------------------     -----------------------------         PCT >= 0.50 ng/mL              PCT 0.26 - 0.50 ng/mL               AND        <80% decrease in PCT             Encourage initiation of                                             antibiotics       Encourage continuation           of antibiotics    ----------------------------     -----------------------------        PCT >= 0.50 ng/mL                  PCT > 0.50 ng/mL               AND         increase in PCT                  Strongly encourage                                      initiation of antibiotics    Strongly encourage escalation           of antibiotics                                     -----------------------------                                            PCT <= 0.25 ng/mL                                                 OR                                        > 80% decrease in PCT                                      Discontinue / Do not initiate                                             antibiotics  Performed at [facility], [address], [place], [place] [address]   CBC with Differential/Platelet     Status: Abnormal  Collection Time: 08/25/22  6:06 AM  Result Value Ref Range   WBC 9.3 4.0 - 10.5 K/uL   RBC 4.51 3.87 - 5.11 MIL/uL   Hemoglobin 13.1 12.0 - 15.0 g/dL   HCT 40.9 81.1 - 91.4 %   MCV 93.8 80.0 - 100.0 fL   MCH 29.0 26.0 - 34.0 pg   MCHC 31.0 30.0 - 36.0 g/dL   RDW 78.2 (H) 95.6 - 21.3 %   Platelets 195 150 - 400 K/uL   nRBC 0.0 0.0 - 0.2 %   Neutrophils Relative % 68 %   Neutro Abs 6.4 1.7 - 7.7 K/uL   Lymphocytes Relative 24 %   Lymphs Abs 2.2 0.7 - 4.0 K/uL   Monocytes Relative 6 %   Monocytes Absolute 0.5 0.1 - 1.0 K/uL   Eosinophils Relative 1 %   Eosinophils Absolute 0.1 0.0 - 0.5 K/uL   Basophils Relative 1 %   Basophils Absolute 0.1 0.0 - 0.1 K/uL   Immature Granulocytes 0 %   Abs Immature Granulocytes 0.02 0.00 - 0.07 K/uL    Comment: Performed at [facility], [address], [place], [place] [address]  Comprehensive metabolic panel     Status: Abnormal   Collection Time: 08/25/22  6:06 AM  Result Value Ref Range   Sodium 139 135 - 145 mmol/L   Potassium 3.8 3.5 - 5.1 mmol/L   Chloride 106 98 - 111 mmol/L   CO2 25 22 - 32 mmol/L   Glucose, Bld 105 (H) 70 - 99 mg/dL    Comment: Glucose reference range applies only to samples taken after fasting for at least 8 hours.   BUN 31 (H) 8 - 23 mg/dL   Creatinine, Ser 8.46 (H) 0.44 - 1.00 mg/dL   Calcium 8.5 (L) 8.9 - 10.3 mg/dL   Total Protein 6.1 (L) 6.5 - 8.1 g/dL   Albumin 3.0 (L) 3.5 - 5.0 g/dL   AST 22 15 - 41 U/L   ALT 23 0 - 44 U/L   Alkaline Phosphatase 46 38 -  126 U/L   Total Bilirubin 0.8 0.3 - 1.2 mg/dL   GFR, Estimated 37 (L) >60 mL/min    Comment: (NOTE) Calculated using the CKD-EPI Creatinine Equation (2021)    Anion gap 8 5 - 15    Comment: Performed at [facility], [address] [place], [place] [address]  Magnesium     Status: None   Collection Time: 08/25/22  6:06 AM  Result Value Ref Range   Magnesium 1.8 1.7 - 2.4 mg/dL    Comment: Performed at [facility], [address] [place], [place] [address]  Ammonia     Status: None   Collection Time: 08/25/22  7:08 AM  Result Value Ref Range   Ammonia 16 9 - 35 umol/L    Comment: Performed at [facility], [address], [place], [place] [address]    Current Facility-Administered Medications  Medication Dose Route Frequency Provider Last Rate Last Admin   acetaminophen (TYLENOL) tablet 650 mg  650 mg Oral Q6H PRN Howerter, Justin B, DO       Or   acetaminophen (TYLENOL) suppository 650 mg  650 mg Rectal Q6H PRN Howerter, Justin B, DO       amoxicillin-clavulanate (AUGMENTIN) 500-125 MG per tablet 1 tablet  1 tablet Oral BID Shalhoub, Deno Lunger, MD   1 tablet at 08/24/22 2225   apixaban (ELIQUIS) tablet 5 mg  5 mg Oral BID Linwood Dibbles, MD   5 mg at 08/24/22 2216   cholecalciferol (VITAMIN D3) 25 MCG (1000 UNIT) tablet 2,000 Units  2,000 Units Oral Daily Linwood Dibbles, MD   2,000 Units at 08/24/22 1040   dorzolamide-timolol (COSOPT) 2-0.5 % ophthalmic solution 1 drop  1 drop Both Eyes BID Linwood Dibbles, MD   1 drop at 08/24/22 2216   doxycycline (VIBRA-TABS) tablet 100 mg  100 mg Oral Q12H Marinda Elk, MD   100 mg at 08/24/22 2216   hydrALAZINE (APRESOLINE) injection 10 mg  10 mg Intravenous Q6H PRN Shalhoub, Deno Lunger, MD       hydrOXYzine (ATARAX) tablet 25 mg  25 mg Oral Q6H PRN Shalhoub, Deno Lunger, MD       latanoprost (XALATAN) 0.005 % ophthalmic solution 1 drop  1 drop Both Eyes QHS Linwood Dibbles, MD   1 drop at 08/22/22 2011    levothyroxine (SYNTHROID) tablet 100 mcg  100 mcg Oral Q0600 Linwood Dibbles, MD   100 mcg at 08/25/22 1610   melatonin tablet 10 mg  10 mg Oral QHS PRN Shalhoub, Deno Lunger, MD       metoprolol succinate (TOPROL-XL) 24 hr tablet 50 mg  50 mg Oral Daily Gonfa, Taye T, MD   50 mg at 08/24/22 1053   ondansetron (ZOFRAN) injection 4 mg  4 mg Intravenous Q6H PRN Howerter, Justin B, DO   4 mg at 08/24/22 1149   pilocarpine (PILOCAR) 1 % ophthalmic solution 1 drop  1 drop Both Eyes QID Linwood Dibbles, MD   1 drop at 08/24/22 2216   QUEtiapine (SEROQUEL) tablet 12.5 mg  12.5 mg Oral BID PRN Candelaria Stagers T, MD   12.5 mg at 08/24/22 0351   thiamine (VITAMIN B1) tablet 100 mg  100 mg Oral Daily Candelaria Stagers T, MD   100 mg at 08/24/22 1040   valproate (DEPACON) 250 mg in dextrose 5 % 50 mL IVPB  250 mg Intravenous Q12H Maryagnes Amos, FNP   Stopped at 08/24/22 2328    Musculoskeletal: Strength & Muscle Tone: within normal limits Gait & Station:  UTA Patient leans: N/A            Psychiatric Specialty Exam:  Presentation  General Appearance:  Appropriate for Environment; Casual  Eye Contact: Good  Speech: Clear and Coherent; Normal Rate  Speech Volume: Normal  Handedness: Right   Mood and Affect  Mood: Euthymic  Affect: Appropriate; Congruent   Thought Process  Thought Processes: Linear; Coherent  Descriptions of Associations:Intact  Orientation:Partial  Thought Content:Logical  History of Schizophrenia/Schizoaffective disorder:No data recorded Duration of Psychotic Symptoms:No data recorded Hallucinations:Hallucinations: None  Ideas of Reference:None  Suicidal Thoughts:Suicidal Thoughts: No  Homicidal Thoughts:Homicidal Thoughts: No   Sensorium  Memory: Immediate Poor; Recent Fair; Remote Poor  Judgment: Fair  Insight: Poor   Executive Functions  Concentration: Fair  Attention Span: Fair  Recall: [employer] of  Knowledge: Fair  Language: Fair   Psychomotor Activity  Psychomotor Activity: Psychomotor Activity: Normal   Assets  Assets: Communication Skills; Social Support; Housing; Desire for Improvement   Sleep  Sleep: Sleep: Fair   Physical Exam: Physical Exam Vitals and nursing note reviewed.  Constitutional:      Appearance: Normal appearance. She is normal weight.  HENT:     Head: Normocephalic.  Skin:    General: Skin is warm and dry.  Neurological:     General: No focal deficit present.  Mental Status: She is alert and oriented to person, place, and time.  Psychiatric:        Attention and Perception: Perception normal. She is inattentive.        Mood and Affect: Mood and affect normal.        Speech: Speech normal.        Behavior: Behavior normal. Behavior is cooperative.        Thought Content: Thought content is paranoid and delusional.        Cognition and Memory: Cognition is impaired. Memory is impaired. She exhibits impaired recent memory and impaired remote memory.        Judgment: Judgment normal.    Review of Systems  Psychiatric/Behavioral: Negative.    All other systems reviewed and are negative.  Blood pressure 139/67, pulse 66, temperature (!) 97.5 F (36.4 C), temperature source Oral, resp. rate 18, height 5\' 5"  (1.651 m), weight 65.3 kg, SpO2 96 %. Body mass index is 23.96 kg/m.  Treatment Plan Summary: Plan    Patient likely has ongoing delirium which presents as fluctuating cognition.  At time of this assessment patient continues to lack any underlying psychiatry diagnosis related to hallucinations. Suspect this is 2/t MCI.   Continue delirium precautions.   No pre-existing history of DX dementia noted in her chart. However chart review shows 08/21 Neurology visit for memory loss deficit. At this appointment she was concerned about dementia, memory loss, word finding, completion of sentences, declined aricept at that time following her  stroke in 12/2018. Recommend she continue Elquis for stroke prevention (history of 2). Due to poor vision unable to complete cognitive evaluation and memory testing (soduku) etc. CT negative for acute stroke. Recommend MRI of head wo contrast if symptoms persist.   Consider follow-up to [facility] for cognitive impairment evaluation.   Labs reviewed and assessed at this time. UA + nitrites and Urine culture (e.coli). Subclinical hypothyroidism deferred to Primary team at this time. TSH (6.142) and Free 4 (1.45). B12 (665), Vitamin D (48.09), Vitamin B1 (115.2), CRP (1.1).   UTI has been treated with rocephin upon admission. Her cognitive impairment symptoms may lag behind her clinical resolution.   WIll continue Depacon 250mg  IV q12 hour for  x 2 doses, at which time we will transition to Depakote sprinkles 125mg  po q8. Will not treat to therapeutic goal (80-100), more so symptom management of agitation, psychosis related to her behaviors. Ammonia (16) and CMP( normal). Valproic level ordered on 08/28/2022.    Psychiatry will re-see patient on Friday 07/05 prior to signing off. As for now there are no barriers to discharging this patient home, if team feels she is appropriate to discharge home.    Disposition: No evidence of imminent risk to self or others at present.   Patient does not meet criteria for psychiatric inpatient admission. Supportive therapy provided about ongoing stressors. Refer to IOP. Discussed crisis plan, support from social network, calling 911, coming to the Emergency Department, and calling Suicide Hotline.  Maryagnes Amos, FNP 08/25/2022 10:50 AM

## 2022-08-25 NOTE — Progress Notes (Signed)
Physical Therapy Treatment Patient Details Name: Amanda Cochran MRN: [id number] DOB: 01/13/1929 Today's Date: 08/25/2022   History of Present Illness Pt is a 89yo female presenting to [facility] ED on 6/27 secondary AME with associated UTI, CT head negative for acute findings.   PMH: depression, hx of CVA (L basal ganglia infarct with R hemiparesis), Hld, HTN, hx of left eye lacunar stroke,    PT Comments  Patient requesting to get OOB to go to bathroom. Pt confused and perseverating on "woman out to get her" but able to be redirected to mobilize. Pt completes bed mob at Mod I level and transfers/gait with RW and min guard with cues for safety. Upon return to bed pt c/o bunion pain when therapist encouraged to continue ambulating. Pt able to don/doff socks without assist and able to reposition at EOB to scoot superior towards head of bed prior to return to supine. EOS pt remained in bed Alarm on and call bell within reach and RN at bedside. Will continue to progress as able.    Assistance Recommended at Discharge Frequent or constant Supervision/Assistance  If plan is discharge home, recommend the following:  Can travel by private vehicle    A little help with walking and/or transfers;A little help with bathing/dressing/bathroom;Assistance with cooking/housework;Direct supervision/assist for medications management;Direct supervision/assist for financial management;Help with stairs or ramp for entrance;Assist for transportation      Equipment Recommendations  None recommended by PT    Recommendations for Other Services       Precautions / Restrictions Precautions Precautions: Fall Restrictions Weight Bearing Restrictions: No     Mobility  Bed Mobility Overal bed mobility: Modified Independent                  Transfers Overall transfer level: Needs assistance Equipment used: Rolling walker (2 wheels) Transfers: Sit to/from Stand Sit to Stand: Min guard           General  transfer comment: guarding for safety. pt able to stand with RW and without.    Ambulation/Gait Ambulation/Gait assistance: Min guard Gait Distance (Feet): 12 Feet (2x12) Assistive device: Rolling walker (2 wheels) Gait Pattern/deviations: Step-through pattern, Decreased stride length, Shuffle, Trunk flexed Gait velocity: decr     General Gait Details: guarding with cues for safety, assist to guide RW with amb to bathroom and back to bed.   Stairs             Wheelchair Mobility     Tilt Bed    Modified Rankin (Stroke Patients Only)       Balance Overall balance assessment: No apparent balance deficits (not formally assessed)                                          Cognition Arousal/Alertness: Awake/alert Behavior During Therapy: WFL for tasks assessed/performed Overall Cognitive Status: No family/caregiver present to determine baseline cognitive functioning Area of Impairment: Orientation, Awareness, Safety/judgement, Following commands, Problem solving, Attention, Memory                               General Comments: pt continues to be confused, talking about a "woman out to get her".        Exercises      General Comments        Pertinent Vitals/Pain Pain Assessment Pain Assessment:  PAINAD Breathing: normal Negative Vocalization: none Facial Expression: smiling or inexpressive Body Language: relaxed Consolability: no need to console PAINAD Score: 0 Pain Intervention(s): Limited activity within patient's tolerance, Monitored during session, Repositioned    Home Living                          Prior Function            PT Goals (current goals can now be found in the care plan section) Acute Rehab PT Goals Patient Stated Goal: To go home PT Goal Formulation: With patient Time For Goal Achievement: 09/05/22 Potential to Achieve Goals: Good Progress towards PT goals: Progressing toward goals     Frequency    Min 1X/week      PT Plan Current plan remains appropriate    Co-evaluation              AM-PAC PT "6 Clicks" Mobility   Outcome Measure  Help needed turning from your back to your side while in a flat bed without using bedrails?: None Help needed moving from lying on your back to sitting on the side of a flat bed without using bedrails?: None Help needed moving to and from a bed to a chair (including a wheelchair)?: A Little Help needed standing up from a chair using your arms (e.g., wheelchair or bedside chair)?: A Little Help needed to walk in hospital room?: A Little Help needed climbing 3-5 steps with a railing? : A Lot 6 Click Score: 19    End of Session Equipment Utilized During Treatment: Gait belt Activity Tolerance: Patient tolerated treatment well;No increased pain Patient left: in chair;with call bell/phone within reach;with chair alarm set;with nursing/sitter in room Nurse Communication: Mobility status PT Visit Diagnosis: Unsteadiness on feet (R26.81);Difficulty in walking, not elsewhere classified (R26.2);Muscle weakness (generalized) (M62.81)     Time: 1443-1500 PT Time Calculation (min) (ACUTE ONLY): 17 min  Charges:    $Gait Training: 8-22 mins PT General Charges $$ ACUTE PT VISIT: 1 Visit                     Wynn Maudlin, DPT Acute Rehabilitation Services Office [phone]  08/25/22 4:52 PM

## 2022-08-26 DIAGNOSIS — F22 Delusional disorders: Secondary | ICD-10-CM | POA: Diagnosis not present

## 2022-08-26 LAB — COMPREHENSIVE METABOLIC PANEL
ALT: 24 U/L (ref 0–44)
AST: 22 U/L (ref 15–41)
Albumin: 3.2 g/dL — ABNORMAL LOW (ref 3.5–5.0)
Alkaline Phosphatase: 47 U/L (ref 38–126)
Anion gap: 13 (ref 5–15)
BUN: 33 mg/dL — ABNORMAL HIGH (ref 8–23)
CO2: 23 mmol/L (ref 22–32)
Calcium: 8.9 mg/dL (ref 8.9–10.3)
Chloride: 105 mmol/L (ref 98–111)
Creatinine, Ser: 1.25 mg/dL — ABNORMAL HIGH (ref 0.44–1.00)
GFR, Estimated: 41 mL/min — ABNORMAL LOW (ref >60)
Glucose, Bld: 98 mg/dL (ref 70–99)
Potassium: 4.1 mmol/L (ref 3.5–5.1)
Sodium: 141 mmol/L (ref 135–145)
Total Bilirubin: 1 mg/dL (ref 0.3–1.2)
Total Protein: 6.4 g/dL — ABNORMAL LOW (ref 6.5–8.1)

## 2022-08-26 LAB — CBC WITH DIFFERENTIAL/PLATELET
Abs Immature Granulocytes: 0.04 10*3/uL (ref 0.00–0.07)
Basophils Absolute: 0.1 10*3/uL (ref 0.0–0.1)
Basophils Relative: 1 %
Eosinophils Absolute: 0.1 10*3/uL (ref 0.0–0.5)
Eosinophils Relative: 1 %
HCT: 46.4 % — ABNORMAL HIGH (ref 36.0–46.0)
Hemoglobin: 14.3 g/dL (ref 12.0–15.0)
Immature Granulocytes: 0 %
Lymphocytes Relative: 22 %
Lymphs Abs: 2.2 10*3/uL (ref 0.7–4.0)
MCH: 28.9 pg (ref 26.0–34.0)
MCHC: 30.8 g/dL (ref 30.0–36.0)
MCV: 93.9 fL (ref 80.0–100.0)
Monocytes Absolute: 0.6 10*3/uL (ref 0.1–1.0)
Monocytes Relative: 6 %
Neutro Abs: 7.1 10*3/uL (ref 1.7–7.7)
Neutrophils Relative %: 70 %
Platelets: 210 10*3/uL (ref 150–400)
RBC: 4.94 MIL/uL (ref 3.87–5.11)
RDW: 17.6 % — ABNORMAL HIGH (ref 11.5–15.5)
WBC: 10 10*3/uL (ref 4.0–10.5)
nRBC: 0 % (ref 0.0–0.2)

## 2022-08-26 LAB — MAGNESIUM: Magnesium: 2 mg/dL (ref 1.7–2.4)

## 2022-08-26 NOTE — Progress Notes (Signed)
[facility] Progress Note  Patient: Amanda Cochran     ZOX:[id number]  DOA: 08/19/2022   PCP: [name], MD       Brief hospital course: This is a 89 year old female who is legally blind, has glaucoma, hypertension, atrial fibrillation, history of CVA with residual right-sided weakness, hypothyroidism who presents to the hospital with confusion.  She initially was felt to have a urinary tract infection and was started on ceftriaxone. Over the course of the hospital stay, the patient has continued to exhibit episodes of confusion including delusions along with agitation.  Despite treatment with antibiotics, this does not appear to have resolved.  Of note, the patient was found to have an EF of 30 to 35% during the hospital stay.  Subjective:  Quite sleepy this morning and not willing to communicate with me.  She has not eaten any of her breakfast.   Assessment and Plan: Principal Problem:   Paranoid delusions (HCC) -The patient lives alone and we have no detailed history in regards to whether she was having these episodes at home and they have increased in the hospital or whether they are acute - Was evaluated by psychiatry on 7/2 and recommended to be started on Depakote - Continue to follow-Depakote level has been ordered for 7/5  Active Problems:   E-coli UTI -Initially started on ceftriaxone - Transitioned to Augmentin with a plan to complete 7 days of treatment-today will be the last day of treatment  Possible pneumonia - Doxycycline added to treatment regimen on 6/30 based on chest x-ray findings of left lower lobe atelectasis versus infiltrate    Chronic systolic CHF (congestive heart failure) (HCC) Atrial fibrillation, chronic -The echo performed on 6/27 reveals an EF of 30 to 35%-diastolic function is indeterminate - Beta-blocker dose increased and now on Toprol-XL 50 mg daily - Continue apixaban    Acute renal failure superimposed on stage 3a chronic kidney  disease (HCC) -Creatinine noted to be as high as 1.36 -Last creatinine documented in epic was from 01/14/2019 and 0.96 at that time -Continue to follow  Hypothyroidism - Mildly elevated TSH - Continue Synthroid at home dose and recheck as outpatient  Legally blind Glaucoma History of CVA     Code Status: Full Code Consultants: Psychiatry Level of Care: Level of care: Med-Surg Total time on patient care: 35 minutes DVT prophylaxis:  SCDs Start: 08/20/22 0644 apixaban (ELIQUIS) tablet 5 mg     Objective:   Vitals:   08/25/22 1335 08/25/22 2014 08/26/22 0605 08/26/22 1439  BP: (!) 156/102 (!) 160/108 (!) 161/90 (!) 146/83  Pulse: 84 72 69 68  Resp: 20 16 16 18   Temp: 97.7 F (36.5 C) 97.7 F (36.5 C) (!) 97.5 F (36.4 C) (!) 97.4 F (36.3 C)  TempSrc: Oral Oral Oral Oral  SpO2: 97% 90% 97% 98%  Weight:      Height:       Filed Weights   08/20/22 1000 08/22/22 0614 08/25/22 0652  Weight: 62.1 kg 64.4 kg 65.3 kg   Exam: General exam: Appears comfortable  HEENT: oral mucosa moist Respiratory system: Clear to auscultation.  Cardiovascular system: S1 & S2 heard  Gastrointestinal system: Abdomen soft, non-tender, nondistended. Normal bowel sounds   Extremities: No cyanosis, clubbing or edema Psychiatry:  Mood & affect appropriate.      CBC: Recent Labs  Lab 08/22/22 0910 08/23/22 0810 08/24/22 1055 08/25/22 0606 08/26/22 0637  WBC 8.2 7.6 10.1 9.3 10.0  NEUTROABS 5.9 5.0 7.2 6.4  7.1  HGB 13.8 13.1 14.0 13.1 14.3  HCT 45.1 41.8 44.8 42.3 46.4*  MCV 94.5 93.5 93.1 93.8 93.9  PLT 141* 199 211 195 210   Basic Metabolic Panel: Recent Labs  Lab 08/21/22 0845 08/22/22 0910 08/23/22 0810 08/24/22 1055 08/25/22 0606 08/26/22 0637  NA 140 139 139 141 139 141  K 3.9 3.9 3.7 3.9 3.8 4.1  CL 104 104 106 107 106 105  CO2 25 22 22 24 25 23   GLUCOSE 122* 122* 115* 115* 105* 98  BUN 29* 25* 29* 31* 31* 33*  CREATININE 1.23* 1.07* 1.13* 1.36* 1.35* 1.25*   CALCIUM 8.6* 9.0 8.6* 8.8* 8.5* 8.9  MG 2.1 1.9 2.1 2.0 1.8 2.0  PHOS 3.4  --   --   --   --   --    GFR: Estimated Creatinine Clearance: 26.4 mL/min (A) (by C-G formula based on SCr of 1.25 mg/dL (H)).  Scheduled Meds:  amoxicillin-clavulanate  1 tablet Oral BID   apixaban  5 mg Oral BID   cholecalciferol  2,000 Units Oral Daily   divalproex  125 mg Oral Q8H   dorzolamide-timolol  1 drop Both Eyes BID   doxycycline  100 mg Oral Q12H   latanoprost  1 drop Both Eyes QHS   levothyroxine  100 mcg Oral Q0600   metoprolol succinate  50 mg Oral Daily   pilocarpine  1 drop Both Eyes QID   thiamine  100 mg Oral Daily   Continuous Infusions: Imaging and lab data was personally reviewed No results found.  LOS: 5 days   Author: Calvert Cantor  08/26/2022 3:56 PM  To contact [facility]>   Check the care team in [facility] and look for the attending/consulting [facility] provider listed  Log into [email or web address] and use [place]'s universal password   Go to> "[facility]"  and find provider  If you still have difficulty reaching the provider, please page the [facility] (Director on Call) for the Hospitalists listed on amion

## 2022-08-26 NOTE — TOC Progression Note (Signed)
Transition of Care [facility]) - Progression Note    Patient Details  Name: Amanda Cochran MRN: [id number] Date of Birth: May 27, 1928  [facility] CM/SW Contact  Beckie Busing, RN Phone Number:[phone]  08/26/2022, 12:47 PM  Clinical Narrative:    [facility] acknowledges consult for SNF. CM at bedside to discuss disposition plan. Patient states that she is from home where she normally lives independently. Patient acknowledges that she has only partial vision noted to her right eye but states that she can function at home where she knows her way around the house. Patient is alert alert to self and situation at this time. Patient is able to verbalize that she has been to rehab before and she doesn't want to go back. Patient states they are not going to do anything that she can't do for herself.  CM spoke with nephew [name]. Per Thayer Ohm the patient has been intermittently confused which is not her baseline. Thayer Ohm states that there is no local family that will be able to assist the patient with care at home. Thayer Ohm states that patient is her own Management consultant and he is not sure that he will have much influence over her decision. Thayer Ohm has suggested that CM attempt to speak with patient at another time for more clarity.    Expected Discharge Plan: Home/Self Care Barriers to Discharge: Continued Medical Work up  Expected Discharge Plan and Services   Discharge Planning Services: CM Consult   Living arrangements for the past 2 months: Single Family Home                                       Social Determinants of Health (SDOH) Interventions SDOH Screenings   Food Insecurity: No Food Insecurity (08/21/2022)  Housing: Low Risk  (08/21/2022)  Transportation Needs: No Transportation Needs (08/21/2022)  Recent Concern: Transportation Needs - Unmet Transportation Needs (08/20/2022)  Utilities: Not At Risk (08/21/2022)  Tobacco Use: Low Risk  (08/20/2022)    Readmission Risk  Interventions     No data to display

## 2022-08-26 NOTE — Progress Notes (Signed)
Mobility Specialist - Progress Note   08/26/22 1317  Mobility  Activity Ambulated with assistance in hallway  Level of Assistance Standby assist, set-up cues, supervision of patient - no hands on  Assistive Device Front wheel walker  Distance Ambulated (ft) 250 ft  Activity Response Tolerated well  Mobility Referral Yes  $Mobility charge 1 Mobility  Mobility Specialist Start Time (ACUTE ONLY) 0100  Mobility Specialist Stop Time (ACUTE ONLY) 0116  Mobility Specialist Time Calculation (min) (ACUTE ONLY) 16 min   Pt received in recliner and agreeable to mobility. Pt was MinA from STS. No complaints during session. Pt to recliner after session with all needs met. Chair alarm on.  [facility]

## 2022-08-26 NOTE — Progress Notes (Signed)
Occupational Therapy Treatment Patient Details Name: Amanda Cochran MRN: [id number] DOB: 1928/12/11 Today's Date: 08/26/2022   History of present illness Pt is a 89yo female presenting to [facility] ED on 6/27 secondary AME with associated UTI, CT head negative for acute findings.   PMH: depression, hx of CVA (L basal ganglia infarct with R hemiparesis), Hld, HTN, hx of left eye lacunar stroke,   OT comments  Late entry. Patient was approached in afternoon when needing to use bathroom. Patient was agreeable to go to bathroom and needed increased multimodal cues to complete all tasks. Patient noted to self limit participation during session with increased time spent trying to get patient to remain out of bed. Nurse present in room during session. Patient noted to perseverate on " woman out to get her". Patient will need 24/7 caregiver support for  ADLs and IADLs in next level of care to be most successful and prevent rehospitalization. Patient's discharge plan remains appropriate at this time. OT will continue to follow acutely.     Recommendations for follow up therapy are one component of a multi-disciplinary discharge planning process, led by the attending physician.  Recommendations may be updated based on patient status, additional functional criteria and insurance authorization.    Assistance Recommended at Discharge Frequent or constant Supervision/Assistance (needs 24/7 caregiver support)  Patient can return home with the following  A little help with walking and/or transfers;A little help with bathing/dressing/bathroom;Assist for transportation;Assistance with cooking/housework;Direct supervision/assist for financial management;Direct supervision/assist for medications management   Equipment Recommendations  None recommended by OT       Precautions / Restrictions Precautions Precautions: Fall Restrictions Weight Bearing Restrictions: No       Mobility Bed Mobility Overal bed mobility:  Modified Independent                        ADL either performed or assessed with clinical judgement   ADL Overall ADL's : Needs assistance/impaired     Grooming: Wash/dry hands;Standing;Min guard Grooming Details (indicate cue type and reason): at sink in bathroom with encouargment and cues for location of needed items (soap and paper towels)               Lower Body Dressing Details (indicate cue type and reason): patient reported she cant stand on feet due to "bunions" patient was unable to agree to get out of bed with this discomfort. patient was able to take off and put socks back on seated EOB with min guard and increased time. patient declined to don shoes that are in closet. Toilet Transfer: Min guard;Stand-pivot;Cueing for safety;Cueing for Primary school teacher Details (indicate cue type and reason): with RW with cues for keeping hands on device. Toileting- Clothing Manipulation and Hygiene: Sitting/lateral lean;Minimal assistance Toileting - Clothing Manipulation Details (indicate cue type and reason): with increased time and A to keep clothing from touching toilet water              Cognition Arousal/Alertness: Awake/alert Behavior During Therapy: WFL for tasks assessed/performed Overall Cognitive Status: No family/caregiver present to determine baseline cognitive functioning Area of Impairment: Orientation, Awareness, Safety/judgement, Following commands, Problem solving, Attention, Memory                       Following Commands: Follows one step commands inconsistently       General Comments: pt continues to be confused, talking about a "woman out to get her".  Pertinent Vitals/ Pain       Pain Assessment Pain Assessment: Faces Pain Score: 0-No pain         Frequency  Min 1X/week        Progress Toward Goals  OT Goals(current goals can now be found in the care plan section)  Progress towards OT  goals: OT to reassess next treatment     Plan Discharge plan remains appropriate    Co-evaluation                 AM-PAC OT "6 Clicks" Daily Activity     Outcome Measure   Help from another person eating meals?: None Help from another person taking care of personal grooming?: A Little Help from another person toileting, which includes using toliet, bedpan, or urinal?: A Little Help from another person bathing (including washing, rinsing, drying)?: A Little Help from another person to put on and taking off regular upper body clothing?: A Little Help from another person to put on and taking off regular lower body clothing?: A Little 6 Click Score: 19    End of Session Equipment Utilized During Treatment: Rolling walker (2 wheels);Gait belt  OT Visit Diagnosis: Other symptoms and signs involving cognitive function;Low vision, both eyes (H54.2)   Activity Tolerance Treatment limited secondary to agitation   Patient Left in bed;with call bell/phone within reach;with bed alarm set;with nursing/sitter in room   Nurse Communication Other (comment) (nurse in room during session)        Time:  -    Late entry  Charges:    Rosalio Loud, MS Acute Rehabilitation Department Office# [phone]   Selinda Flavin 08/26/2022, 7:19 AM

## 2022-08-27 DIAGNOSIS — G9341 Metabolic encephalopathy: Secondary | ICD-10-CM | POA: Diagnosis not present

## 2022-08-27 MED ORDER — ENSURE ENLIVE PO LIQD
237.0000 mL | Freq: Three times a day (TID) | ORAL | Status: DC
  Administered 2022-08-27 – 2022-09-16 (×50): 237 mL via ORAL

## 2022-08-27 NOTE — Progress Notes (Signed)
Occupational Therapy Treatment Patient Details Name: Amanda Cochran MRN: [id number] DOB: 1928-10-21 Today's Date: 08/27/2022   History of present illness Pt is a 89yo female presenting to [facility] ED on 6/27 secondary AME with associated UTI, CT head negative for acute findings.   PMH: depression, hx of CVA (L basal ganglia infarct with R hemiparesis), Hld, HTN, hx of left eye lacunar stroke,   OT comments  Pt unable to demonstrate any progress towards OT goals, although did participate enough in cognitive screen to establish baseline from which to judge cognitive progress.  Pt scored Short Blessed test with significant cognitive impairment noted by score of 25. Pt able to respond correctly to 2/4 safety questions taken from the [facility]   Patient remains limited by severe cognitive deficits in setting of blindness and severe hearing loss, generalized weakness and decreased activity tolerance along with deficits noted below. Pt demonstrates fair/guarded rehab potential and would benefit from continued skilled OT to increase safety and independence with ADLs and functional transfers to allow pt to return home safely and reduce caregiver burden and fall risk.    Recommendations for follow up therapy are one component of a multi-disciplinary discharge planning process, led by the attending physician.  Recommendations may be updated based on patient status, additional functional criteria and insurance authorization.    Assistance Recommended at Discharge Frequent or constant Supervision/Assistance  Patient can return home with the following  A little help with walking and/or transfers;A little help with bathing/dressing/bathroom;Assist for transportation;Assistance with cooking/housework;Direct supervision/assist for financial management;Direct supervision/assist for medications management   Equipment Recommendations  None recommended by OT    Recommendations for Other Services      Precautions /  Restrictions Precautions Precautions: Fall Restrictions Weight Bearing Restrictions: No       Mobility Bed Mobility                    Transfers                         Balance                                           ADL either performed or assessed with clinical judgement   ADL Overall ADL's :  (Pt refused all ADLs this session. Refused mobility.)                                            Extremity/Trunk Assessment              Vision Baseline Vision/History: 2 Legally blind Ability to See in Adequate Light: 3 Highly impaired     Perception     Praxis      Cognition Arousal/Alertness: Awake/alert Behavior During Therapy: Agitated, Anxious Overall Cognitive Status: No family/caregiver present to determine baseline cognitive functioning Area of Impairment: Orientation, Awareness, Safety/judgement, Following commands, Problem solving, Attention, Memory                 Orientation Level: Disoriented to, Time, Situation (Today, oriented to month but ot year. Repeating, "You ruined my 4th of July".) Current Attention Level: Selective Memory: Decreased short-term memory Following Commands: Follows one step commands inconsistently Safety/Judgement: Decreased awareness of deficits Awareness: Emergent Problem Solving: Requires  verbal cues General Comments: Demonstrating some paranoia, accussing staff of trying to kill her. Accused this OT of "taking me out of my home".   Poor reasoning skills.        Exercises Other Exercises Other Exercises: Pt completed the Short Blessed Test scoring a 25.  Scores over 10 are considered significantly altered and worst possible score is 28.  Pt did participate a little but would self-limit and ask, "Why are we doing this when I'll be dead tomorrow?" Other Exercises: Pt answered 2/4 KELS questions correctly: Including actions for chest pain and SHOB and how to call  emergency services.  Pt did respond sufficiently to actions for cold or for a burn that is infected without need of prompts/assistance.    Shoulder Instructions       General Comments      Pertinent Vitals/ Pain       Pain Assessment Pain Assessment: Faces Faces Pain Scale: No hurt  Home Living                                          Prior Functioning/Environment              Frequency  Min 1X/week        Progress Toward Goals  OT Goals(current goals can now be found in the care plan section)  Progress towards OT goals: Not progressing toward goals - comment  Acute Rehab OT Goals Patient Stated Goal: To die. OT Goal Formulation: Patient unable to participate in goal setting Time For Goal Achievement: 09/05/22 Potential to Achieve Goals: Fair  Plan Discharge plan remains appropriate    Co-evaluation                 AM-PAC OT "6 Clicks" Daily Activity     Outcome Measure   Help from another person eating meals?: A Little Help from another person taking care of personal grooming?: A Little Help from another person toileting, which includes using toliet, bedpan, or urinal?: A Little Help from another person bathing (including washing, rinsing, drying)?: A Little Help from another person to put on and taking off regular upper body clothing?: A Little Help from another person to put on and taking off regular lower body clothing?: A Little 6 Click Score: 18    End of Session    OT Visit Diagnosis: Other symptoms and signs involving cognitive function;Low vision, both eyes (H54.2)   Activity Tolerance Treatment limited secondary to agitation   Patient Left in bed;with call bell/phone within reach;with bed alarm set   Nurse Communication Other (comment) (Refused food, refused OOB, refused rolling in bed to check skin)        Time: 1610-9604 OT Time Calculation (min): 18 min  Charges: OT General Charges $OT Visit: 1 Visit OT  Treatments $Cognitive Funtion inital: Initial 15 mins  Victorino Dike, OT Acute Rehab Services Office: [phone] 08/27/2022  Theodoro Clock 08/27/2022, 12:13 PM

## 2022-08-27 NOTE — Progress Notes (Signed)
PT Cancellation Note  Patient Details Name: Amanda Cochran MRN: [id number] DOB: 24-Jul-1928   Cancelled Treatment:    Reason Eval/Treat Not Completed: Patient declined, no reason specified Pt refuses to be OOB at this time.  Attempted encouragement and NT also into room however pt declines to participate.  Pt has been ambulating to bathroom per NT.     Janan Halter [place] 08/27/2022, 11:14 AM Paulino Door, DPT Physical Therapist Acute Rehabilitation Services Office: [phone]

## 2022-08-27 NOTE — Progress Notes (Addendum)
[facility] Progress Note  Patient: Amanda Cochran     UEA:[id number]  DOA: 08/19/2022   PCP: Amanda Ran, MD       Brief hospital course: This is a 89 year old female who is legally blind, has glaucoma, hypertension, atrial fibrillation, history of CVA with residual right-sided weakness, hypothyroidism who presents to the hospital with confusion.  She initially was felt to have a urinary tract infection and was started on ceftriaxone. Over the course of the hospital stay, the patient has continued to exhibit episodes of confusion including delusions along with agitation.  Despite treatment with antibiotics, this does not appear to have resolved.  Of note, the patient was found to have an EF of 30 to 35% during the hospital stay.  Subjective:  She has no complaints for me. She is aware that she is in the hospital and would like to go home.  Per RN, she is still having hallucinations.  Per Amanda Hoyer NP with psych, the patient is stating that she wants to die  Assessment and Plan: Principal Problem:   Paranoid delusions (HCC) -The patient lives alone and we have no detailed history in regards to whether she was having these episodes at home and they have increased in the hospital or whether they are acute - Was evaluated by psychiatry on 7/2 and recommended to be started on Depakote - Continue to follow-Depakote level has been ordered for 7/5 - per psych, she is wanting to die and they have recommended a palliative care consult- will hold off on this for now and attempt to communicate with her further about this  - she is aware she is in the hospital but otherwise is declining to answer orientation questions for me "I don't have to tell you".  - I have spoken with Amanda Cochran whom the patient has asked me to contact- According to [facility], the patient has had a decline in cognitive status over the past year (cannot give me specific examples) -   Active Problems:  Poor oral  intake - have asked RN to document I and Os today    E-coli UTI -Initially started on ceftriaxone - Transitioned to Augmentin with a plan to complete 7 days of treatment which she has completed  Possible pneumonia - Doxycycline added to treatment regimen on 6/30 based on chest x-ray findings of left lower lobe atelectasis versus infiltrate- completing 5 day course today    Chronic systolic CHF (congestive heart failure) (HCC) Atrial fibrillation, chronic -The echo performed on 6/27 reveals an EF of 30 to 35%-diastolic function is indeterminate - Beta-blocker dose increased and now on Toprol-XL 50 mg daily - Continue apixaban    Acute renal failure superimposed on stage 3a chronic kidney disease (HCC) -Creatinine noted to be as high as 1.36 -Last creatinine documented in epic was from 01/14/2019 and 0.96 at that time -Continue to follow  Hypothyroidism - Mildly elevated TSH - Continue Synthroid at home dose and recheck as outpatient  Legally blind Glaucoma History of CVA     Code Status: Full Code Consultants: Psychiatry Level of Care: Level of care: Med-Surg Total time on patient care: 35 minutes DVT prophylaxis:  SCDs Start: 08/20/22 0644 apixaban (ELIQUIS) tablet 5 mg     Objective:   Vitals:   08/26/22 2029 08/27/22 0500 08/27/22 0527 08/27/22 0927  BP: (!) 150/74  (!) 161/84 (!) 158/83  Pulse: 68  65 (!) 107  Resp: 16  16 16   Temp: 97.9 F (36.6 C)  97.7 F (36.5 C) (!) 97.5 F (36.4 C)  TempSrc: Oral  Oral Oral  SpO2: 97%  93% 96%  Weight:  64.4 kg    Height:       Filed Weights   08/22/22 0614 08/25/22 0652 08/27/22 0500  Weight: 64.4 kg 65.3 kg 64.4 kg   Exam: General exam: Appears comfortable  HEENT: oral mucosa moist Respiratory system: Clear to auscultation.  Cardiovascular system: S1 & S2 heard  Gastrointestinal system: Abdomen soft, non-tender, nondistended. Normal bowel sounds   Extremities: No cyanosis, clubbing or edema Psychiatry:  Mood  & affect appropriate.       CBC: Recent Labs  Lab 08/22/22 0910 08/23/22 0810 08/24/22 1055 08/25/22 0606 08/26/22 0637  WBC 8.2 7.6 10.1 9.3 10.0  NEUTROABS 5.9 5.0 7.2 6.4 7.1  HGB 13.8 13.1 14.0 13.1 14.3  HCT 45.1 41.8 44.8 42.3 46.4*  MCV 94.5 93.5 93.1 93.8 93.9  PLT 141* 199 211 195 210    Basic Metabolic Panel: Recent Labs  Lab 08/21/22 0845 08/22/22 0910 08/23/22 0810 08/24/22 1055 08/25/22 0606 08/26/22 0637  NA 140 139 139 141 139 141  K 3.9 3.9 3.7 3.9 3.8 4.1  CL 104 104 106 107 106 105  CO2 25 22 22 24 25 23   GLUCOSE 122* 122* 115* 115* 105* 98  BUN 29* 25* 29* 31* 31* 33*  CREATININE 1.23* 1.07* 1.13* 1.36* 1.35* 1.25*  CALCIUM 8.6* 9.0 8.6* 8.8* 8.5* 8.9  MG 2.1 1.9 2.1 2.0 1.8 2.0  PHOS 3.4  --   --   --   --   --     GFR: Estimated Creatinine Clearance: 26.4 mL/min (A) (by C-G formula based on SCr of 1.25 mg/dL (H)).  Scheduled Meds:  apixaban  5 mg Oral BID   cholecalciferol  2,000 Units Oral Daily   divalproex  125 mg Oral Q8H   dorzolamide-timolol  1 drop Both Eyes BID   doxycycline  100 mg Oral Q12H   latanoprost  1 drop Both Eyes QHS   levothyroxine  100 mcg Oral Q0600   metoprolol succinate  50 mg Oral Daily   pilocarpine  1 drop Both Eyes QID   thiamine  100 mg Oral Daily   Continuous Infusions: Imaging and lab data was personally reviewed No results found.  LOS: 6 days   Author: Calvert Cantor  08/27/2022 2:21 PM  To contact [facility]>   Check the care team in [facility] and look for the attending/consulting [facility] provider listed  Log into [email or web address] and use [place]'s universal password   Go to> "[facility]"  and find provider  If you still have difficulty reaching the provider, please page the [facility] (Director on Call) for the Hospitalists listed on amion

## 2022-08-27 NOTE — Progress Notes (Signed)
Amanda Cochran ate a few bites of breakfast and very little for lunch and dinner. She was encouraged to drink fluids intermittently throughout the day and had about fluid by the end of the shift. I offered her sips of Ensure and she refused. She is mostly cooperative but can be non-compliant and occasionally refuses to answer questions.  She walks assisted with a walker to the bathroom and had 2 BM's. She is mostly anxious at baseline and the day shift tech mentioned that she made a statement that she, "wants to die." She sat in the chair for about 2 hours today but wanted to return to bed stating that her back hurt.

## 2022-08-27 NOTE — Care Management Important Message (Signed)
Important Message  Patient Details IM Letter given. Name: Amanda Cochran MRN: [id number] Date of Birth: Apr 06, 1928   Medicare Important Message Given:  Yes     Caren Macadam 08/27/2022, 10:42 AM

## 2022-08-27 NOTE — Consult Note (Signed)
[facility] Face-to-Face Psychiatry Consult   Reason for Consult:  Persistin delusions, negative workup for encephalopathy Referring Physician:  Dr. Leafy Half Patient Identification: JIAQI RAHN MRN:  [id number] Principal Diagnosis: Acute metabolic encephalopathy Diagnosis:  Principal Problem:   Acute metabolic encephalopathy Active Problems:   Essential hypertension   Hyperlipidemia   Right hemiparesis (HCC)   Left sided lacunar stroke (HCC)   Atrial fibrillation, chronic (HCC)   History of CVA (cerebrovascular accident)   MCI (mild cognitive impairment)   Primary open angle glaucoma of both eyes, severe stage   Paranoid delusion (HCC)   Acute HFrEF (heart failure with reduced ejection fraction) (HCC)   Osteoarthritis   Acute renal failure superimposed on stage 3a chronic kidney disease (HCC)   Chronic systolic CHF (congestive heart failure) (HCC)   E-coli UTI   Hypothyroidism   Pneumonia of left lower lobe due to infectious organism   Total Time spent with patient: 20 minutes  Subjective:   Makalia DIONTE PREECE is a 89 y.o. female patient admitted with paranoid delusions.   Patient was seen for reassessment. Patient was observed sitting in  bed. Patient was alert and oriented x 3. She is fairly groomed and appears to have been bathed. Her mood is euthymic but blunt at times, her affect is congruent and appropriate.  Patient appears to be improving from when she originally presented to the ER. Per chart review she is no longer restless, and improved with hands on care, ambulation and talking to the patient. Patient may benefit from skilled nursing rehab or assisted living facility, however she has declined and expresses much interest in returning home. She would like to spend her last days in the comfort of her home. She is speaking to God and discussing death and heaven; she is perfectly clear that she is not suicidal nor would she hurt herself. She is at peace and has lived a long life  Will  psych clear at this time, it is encouraged that we continue current medications and safety measures. She may experience some confusion, agitation, and restlessness as well as Sundowners this is completely normal behavior in a person of this age. She denies si/hi/avh.   HPI:  SHAREETA SJOBLOM is a 89 y.o. female with medical history significant of osteoarthritis, depression, left CVA, glaucoma, hyperlipidemia, hypertension, left eye lacunar stroke, right hemiparesis, left basal ganglia infarct, hypothyroidism who was brought to the emergency department due to paranoid delusions.  She was screaming that there were people in her house.  One of her neighbors heard her scream and called EMS.  When seen, the patient was sleeping in NAD, but arousable.  She is unable to elaborate at the moment.  No complaints of pain or dyspnea at the moment.   Past Psychiatric History: UTA  Risk to Self:  UTA Risk to Others:  UTA Prior Inpatient Therapy:  UTA Prior Outpatient Therapy:  UTA  Past Medical History:  Past Medical History:  Diagnosis Date   Arthritis    Bladder infection    Blindness    CVA (cerebral infarction) 02/07/2014   Left basal ganglia infarct with right hemiparesis    Glaucoma    Glaucoma    Glaucoma (increased eye pressure)    Hyperlipidemia    Hypertension    Left sided lacunar stroke (HCC) 02/14/2014   Right hemiparesis (HCC) 02/09/2014   Left basal ganglia infarct Dec 2015    Stroke [facility])    Thyroid disease     Past Surgical History:  Procedure Laterality Date   cataract surgery Bilateral 2015   THYROID SURGERY  1976   TONSILLECTOMY AND ADENOIDECTOMY     VAGINAL PROLAPSE REPAIR     Family History:  Family History  Problem Relation Age of Onset   Heart failure Mother    AAA (abdominal aortic aneurysm) Father    Family Psychiatric  History: UTA Social History:  Social History   Substance and Sexual Activity  Alcohol Use No     Social History   Substance and Sexual  Activity  Drug Use No    Social History   Socioeconomic History   Marital status: Widowed    Spouse name: deceased   Number of children: 0   Years of education: 14   Highest education level: Not on file  Occupational History   Occupation: retired    Comment: Print production planner  Tobacco Use   Smoking status: Never   Smokeless tobacco: Never  Vaping Use   Vaping Use: Never used  Substance and Sexual Activity   Alcohol use: No   Drug use: No   Sexual activity: Not on file  Other Topics Concern   Not on file  Social History Narrative   Widowed, lives alone   Caffeine use- 1 cup coffee daily   Right handed   No children   Social Determinants of Health   Financial Resource Strain: Not on file  Food Insecurity: No Food Insecurity (08/21/2022)   Hunger Vital Sign    Worried About Running Out of Food in the Last Year: Never true    Ran Out of Food in the Last Year: Never true  Transportation Needs: No Transportation Needs (08/21/2022)   PRAPARE - Administrator, Civil Service (Medical): No    Lack of Transportation (Non-Medical): No  Recent Concern: Transportation Needs - Unmet Transportation Needs (08/20/2022)   PRAPARE - Administrator, Civil Service (Medical): Yes    Lack of Transportation (Non-Medical): Yes  Physical Activity: Not on file  Stress: Not on file  Social Connections: Not on file   Additional Social History:    Allergies:  No Known Allergies  Labs:  Results for orders placed or performed during the hospital encounter of 08/19/22 (from the past 48 hour(s))  Comprehensive metabolic panel     Status: Abnormal   Collection Time: 08/26/22  6:37 AM  Result Value Ref Range   Sodium 141 135 - 145 mmol/L   Potassium 4.1 3.5 - 5.1 mmol/L   Chloride 105 98 - 111 mmol/L   CO2 23 22 - 32 mmol/L   Glucose, Bld 98 70 - 99 mg/dL    Comment: Glucose reference range applies only to samples taken after fasting for at least 8 hours.   BUN 33 (H) 8 -  23 mg/dL   Creatinine, Ser 9.60 (H) 0.44 - 1.00 mg/dL   Calcium 8.9 8.9 - 45.4 mg/dL   Total Protein 6.4 (L) 6.5 - 8.1 g/dL   Albumin 3.2 (L) 3.5 - 5.0 g/dL   AST 22 15 - 41 U/L   ALT 24 0 - 44 U/L   Alkaline Phosphatase 47 38 - 126 U/L   Total Bilirubin 1.0 0.3 - 1.2 mg/dL   GFR, Estimated 41 (L) >60 mL/min    Comment: (NOTE) Calculated using the CKD-EPI Creatinine Equation (2021)    Anion gap 13 5 - 15    Comment: Performed at [facility], [address], [place], [place] [address]  Magnesium     Status: None   Collection Time: 08/26/22  6:37 AM  Result Value Ref Range   Magnesium 2.0 1.7 - 2.4 mg/dL    Comment: Performed at [facility], [address], [place], [place] [address]  CBC with Differential/Platelet     Status: Abnormal   Collection Time: 08/26/22  6:37 AM  Result Value Ref Range   WBC 10.0 4.0 - 10.5 K/uL   RBC 4.94 3.87 - 5.11 MIL/uL   Hemoglobin 14.3 12.0 - 15.0 g/dL   HCT 60.4 (H) 54.0 - 98.1 %   MCV 93.9 80.0 - 100.0 fL   MCH 28.9 26.0 - 34.0 pg   MCHC 30.8 30.0 - 36.0 g/dL   RDW 19.1 (H) 47.8 - 29.5 %   Platelets 210 150 - 400 K/uL   nRBC 0.0 0.0 - 0.2 %   Neutrophils Relative % 70 %   Neutro Abs 7.1 1.7 - 7.7 K/uL   Lymphocytes Relative 22 %   Lymphs Abs 2.2 0.7 - 4.0 K/uL   Monocytes Relative 6 %   Monocytes Absolute 0.6 0.1 - 1.0 K/uL   Eosinophils Relative 1 %   Eosinophils Absolute 0.1 0.0 - 0.5 K/uL   Basophils Relative 1 %   Basophils Absolute 0.1 0.0 - 0.1 K/uL   Immature Granulocytes 0 %   Abs Immature Granulocytes 0.04 0.00 - 0.07 K/uL    Comment: Performed at [facility], [address], [place], [place] [address]    Current Facility-Administered Medications  Medication Dose Route Frequency Provider Last Rate Last Admin   acetaminophen (TYLENOL) tablet 650 mg  650 mg Oral Q6H PRN Howerter, Justin B, DO       Or   acetaminophen (TYLENOL) suppository 650 mg  650 mg Rectal Q6H  PRN Howerter, Justin B, DO       apixaban (ELIQUIS) tablet 5 mg  5 mg Oral BID Linwood Dibbles, MD   5 mg at 08/27/22 8657   cholecalciferol (VITAMIN D3) 25 MCG (1000 UNIT) tablet 2,000 Units  2,000 Units Oral Daily Linwood Dibbles, MD   2,000 Units at 08/27/22 0925   divalproex (DEPAKOTE SPRINKLE) capsule 125 mg  125 mg Oral Q8H Maryagnes Amos, FNP   125 mg at 08/27/22 0631   dorzolamide-timolol (COSOPT) 2-0.5 % ophthalmic solution 1 drop  1 drop Both Eyes BID Linwood Dibbles, MD   1 drop at 08/27/22 0929   doxycycline (VIBRA-TABS) tablet 100 mg  100 mg Oral Q12H Marinda Elk, MD   100 mg at 08/27/22 8469   hydrALAZINE (APRESOLINE) injection 10 mg  10 mg Intravenous Q6H PRN Shalhoub, Deno Lunger, MD       hydrOXYzine (ATARAX) tablet 25 mg  25 mg Oral Q6H PRN Shalhoub, Deno Lunger, MD       latanoprost (XALATAN) 0.005 % ophthalmic solution 1 drop  1 drop Both Eyes QHS Linwood Dibbles, MD   1 drop at 08/26/22 2230   levothyroxine (SYNTHROID) tablet 100 mcg  100 mcg Oral Q0600 Linwood Dibbles, MD   100 mcg at 08/27/22 0630   melatonin tablet 10 mg  10 mg Oral QHS PRN Shalhoub, Deno Lunger, MD       metoprolol succinate (TOPROL-XL) 24 hr tablet 50 mg  50 mg Oral Daily Candelaria Stagers T, MD   50 mg at 08/27/22 0924   ondansetron (ZOFRAN) injection 4 mg  4 mg Intravenous Q6H PRN Howerter, Justin B, DO   4 mg  at 08/26/22 2330   pilocarpine (PILOCAR) 1 % ophthalmic solution 1 drop  1 drop Both Eyes QID Linwood Dibbles, MD   1 drop at 08/27/22 1610   QUEtiapine (SEROQUEL) tablet 12.5 mg  12.5 mg Oral BID PRN Candelaria Stagers T, MD   12.5 mg at 08/24/22 0351   thiamine (VITAMIN B1) tablet 100 mg  100 mg Oral Daily Candelaria Stagers T, MD   100 mg at 08/27/22 9604    Musculoskeletal: Strength & Muscle Tone: within normal limits Gait & Station:  UTA Patient leans: N/A            Psychiatric Specialty Exam:  Presentation  General Appearance:  Appropriate for Environment; Casual  Eye Contact: Good  Speech: Clear and Coherent;  Normal Rate  Speech Volume: Normal  Handedness: Right   Mood and Affect  Mood: Euthymic  Affect: Appropriate; Congruent   Thought Process  Thought Processes: Coherent; Linear  Descriptions of Associations:Intact  Orientation:Full (Time, Place and Person)  Thought Content:Logical  History of Schizophrenia/Schizoaffective disorder:No data recorded Duration of Psychotic Symptoms:No data recorded Hallucinations:Hallucinations: None  Ideas of Reference:None  Suicidal Thoughts:Suicidal Thoughts: No  Homicidal Thoughts:Homicidal Thoughts: No   Sensorium  Memory: Immediate Fair; Recent Fair; Remote Fair  Judgment: Fair  Insight: Fair   Art therapist  Concentration: Fair  Attention Span: Fair  Recall: Fair  Fund of Knowledge: Good  Language: Good   Psychomotor Activity  Psychomotor Activity: Psychomotor Activity: Normal   Assets  Assets: Communication Skills; Social Support; Housing; Desire for Improvement   Sleep  Sleep: Sleep: Fair   Physical Exam: Physical Exam Vitals and nursing note reviewed.  Constitutional:      Appearance: Normal appearance. She is normal weight.  HENT:     Head: Normocephalic.  Skin:    General: Skin is warm and dry.     Capillary Refill: Capillary refill takes less than 2 seconds.  Neurological:     General: No focal deficit present.     Mental Status: She is alert. Mental status is at baseline. She is disoriented.  Psychiatric:        Attention and Perception: Perception normal. She is inattentive.        Mood and Affect: Mood and affect normal.        Speech: Speech normal.        Behavior: Behavior normal. Behavior is cooperative.        Thought Content: Thought content is paranoid and delusional.        Cognition and Memory: Cognition is impaired. Memory is impaired. She exhibits impaired recent memory and impaired remote memory.        Judgment: Judgment normal.    Review of Systems   Psychiatric/Behavioral: Negative.    All other systems reviewed and are negative.  Blood pressure (!) 158/83, pulse (!) 107, temperature (!) 97.5 F (36.4 C), temperature source Oral, resp. rate 16, height 5\' 5"  (1.651 m), weight 64.4 kg, SpO2 96 %. Body mass index is 23.63 kg/m.  Treatment Plan Summary: Plan    Patient likely has ongoing delirium which presents as fluctuating cognition.  At time of this assessment patient continues to lack any underlying psychiatry diagnosis related to hallucinations. Suspect this is 2/t MCI.   Patient does not present overtly psychotic and denies any AVH or psychosis, it is quite possible that patient's intermittent hallucination was secondary to delirium, recommend no medication changes at this time, I would not start patient on any antipsychotic at this  time unless more concerning report of hallucinations noted, otherwise recommend outpatient follow-up with primary psychiatrist for further follow-up.   Continue delirium precautions.   Consider PMT consult, to assist with goals of care and EOL wishes.   Labs reviewed and assessed at this time. UA + nitrites and Urine culture (e.coli). Subclinical hypothyroidism deferred to Primary team at this time. TSH (6.142) and Free 4 (1.45). B12 (665), Vitamin D (48.09), Vitamin B1 (115.2), CRP (1.1).   WIll continue Depakote sprinkles 125mg  po q8. Will not treat to therapeutic goal (80-100), more so symptom management of agitation, psychosis related to her behaviors. Ammonia (16) and CMP( normal). Valproic level ordered on 08/28/2022.   Psychiatry will sign off at this time.  Delirium has resolved, and no longer warrants inpatient psych consult service.   Disposition: No evidence of imminent risk to self or others at present.   Patient does not meet criteria for psychiatric inpatient admission. Supportive therapy provided about ongoing stressors. Refer to IOP. Discussed crisis plan, support from social network,  calling 911, coming to the Emergency Department, and calling Suicide Hotline.  Maryagnes Amos, FNP 08/27/2022 11:18 AM

## 2022-08-28 DIAGNOSIS — G9341 Metabolic encephalopathy: Secondary | ICD-10-CM | POA: Diagnosis not present

## 2022-08-28 LAB — VALPROIC ACID LEVEL: Valproic Acid Lvl: 37 ug/mL — ABNORMAL LOW (ref 50.0–100.0)

## 2022-08-28 NOTE — Progress Notes (Addendum)
Patient ate half of her breakfast today which includes eggs, bacon, english muffin, and hash browns. She drank half of a chocolate ensure for lunch. For dinner she had meatloaf, mashed potatoes, and string beans with a chocolate ice cream. She ate a few bites of dinner but ate 75% of the chocolate ice cream with half of an ensure. She is continent of bowel and bladder and ambulates to the bathroom with a walker. She has had one bowel movement. She has been able to perform her ADLs with minimum assist which includes feeding herself and brushing her teeth.

## 2022-08-28 NOTE — TOC Progression Note (Signed)
Transition of Care [facility]) - Progression Note    Patient Details  Name: Amanda Cochran MRN: [id number] Date of Birth: 13-Mar-1928  [facility] CM/SW Contact  Beckie Busing, RN Phone [phone]  08/28/2022, 4:02 PM  Clinical Narrative:    TOC continues to follow. Disposition undetermined patient reports that a Transport planner and his wife assist her with household chores , errands and groceries. MD states that patient may be able to return home if more frequent visits can be arranged. CM unable to locate a number for [facility] by the name of Matt Stall. Patient nephew does not have the contact and states that he is unable to make more frequent visits. TOC will continue to follow for disposition plan.     Expected Discharge Plan: Home/Self Care Barriers to Discharge: Continued Medical Work up  Expected Discharge Plan and Services   Discharge Planning Services: CM Consult   Living arrangements for the past 2 months: Single Family Home                                       Social Determinants of Health (SDOH) Interventions SDOH Screenings   Food Insecurity: No Food Insecurity (08/21/2022)  Housing: Low Risk  (08/21/2022)  Transportation Needs: No Transportation Needs (08/21/2022)  Recent Concern: Transportation Needs - Unmet Transportation Needs (08/20/2022)  Utilities: Not At Risk (08/21/2022)  Tobacco Use: Low Risk  (08/20/2022)    Readmission Risk Interventions     No data to display

## 2022-08-28 NOTE — Progress Notes (Signed)
[facility] Progress Note  Patient: Amanda Cochran     ZOX:[id number]  DOA: 08/19/2022   PCP: [name], MD       Brief hospital course: This is a 89 year old female who is legally blind, has glaucoma, hypertension, atrial fibrillation, history of CVA with residual right-sided weakness, hypothyroidism who presents to the hospital with confusion.  She initially was felt to have a urinary tract infection and was started on ceftriaxone. Over the course of the hospital stay, the patient has continued to exhibit episodes of confusion including delusions along with agitation.  Despite treatment with antibiotics, this does not appear to have resolved.  Of note, the patient was found to have an EF of 30 to 35% during the hospital stay.  Subjective:  No complaints. Again asking when she can go home  Assessment and Plan: Principal Problem:   Paranoid delusions (HCC) -The patient lives alone and we have no detailed history in regards to whether she was having these episodes at home and they have increased in the hospital or whether they are acute - Was evaluated by psychiatry on 7/2 and recommended to be started on Depakote - Continue to follow-Depakote level has been ordered for 7/5 - per psych, she is wanting to die and they have recommended a palliative care consult- will hold off on this for now and attempt to communicate with her further about this  - she is aware she is in the hospital but otherwise is declining to answer orientation questions for me "I don't have to tell you".  - I have spoken with [name] whom the patient has asked me to contact- According to [facility], the patient has had a decline in cognitive status over the past year (cannot give me specific examples)  - over the past few days I have not seen any evidence of delusions.No suicidal ideation on my eval-  - over the past couple of days based upon my discussion with the RNs and the nursing tech, the patient has not had  any hallucinations - She appears stable on Depakote  Active Problems:  Poor oral intake - appears to have improved -- she ate half her lunch today>  see RN note from 3:34 today    E-coli UTI -Initially started on ceftriaxone - Transitioned to Augmentin - completed 7 days of treatment   Possible pneumonia - Doxycycline added to treatment regimen on 6/30 based on chest x-ray findings of left lower lobe atelectasis versus infiltrate- completed 5 day course d    Chronic systolic CHF (congestive heart failure) (HCC) Atrial fibrillation, chronic -The echo performed on 6/27 reveals an EF of 30 to 35%-diastolic function is indeterminate - Beta-blocker dose increased and now on Toprol-XL 50 mg daily - Continue apixaban    Acute renal failure superimposed on stage 3a chronic kidney disease (HCC) -Creatinine noted to be as high as 1.36 -Last creatinine documented in epic was from 01/14/2019 and 0.96 at that time -Continue to follow  Hypothyroidism - Mildly elevated TSH - Continue Synthroid at home dose and recheck as outpatient  Legally blind Glaucoma History of CVA     Code Status: Full Code Consultants: Psychiatry Level of Care: Level of care: Med-Surg Total time on patient care: 35 minutes DVT prophylaxis:  SCDs Start: 08/20/22 0644 apixaban (ELIQUIS) tablet 5 mg     Objective:   Vitals:   08/27/22 1955 08/28/22 0542 08/28/22 0630 08/28/22 1358  BP: (!) 165/94  (!) 164/93 137/86  Pulse: 85  61 65  Resp: 20  16 16   Temp: 97.6 F (36.4 C)  (!) 97.5 F (36.4 C) 98 F (36.7 C)  TempSrc: Oral  Oral   SpO2: 94%  98% 96%  Weight:  66.7 kg    Height:       Filed Weights   08/25/22 0652 08/27/22 0500 08/28/22 0542  Weight: 65.3 kg 64.4 kg 66.7 kg   Exam: General exam: Appears comfortable  HEENT: oral mucosa moist Respiratory system: Clear to auscultation.  Cardiovascular system: S1 & S2 heard  Gastrointestinal system: Abdomen soft, non-tender, nondistended. Normal  bowel sounds   Extremities: No cyanosis, clubbing or edema Psychiatry:  Mood & affect appropriate.        CBC: Recent Labs  Lab 08/22/22 0910 08/23/22 0810 08/24/22 1055 08/25/22 0606 08/26/22 0637  WBC 8.2 7.6 10.1 9.3 10.0  NEUTROABS 5.9 5.0 7.2 6.4 7.1  HGB 13.8 13.1 14.0 13.1 14.3  HCT 45.1 41.8 44.8 42.3 46.4*  MCV 94.5 93.5 93.1 93.8 93.9  PLT 141* 199 211 195 210   Basic Metabolic Panel: Recent Labs  Lab 08/22/22 0910 08/23/22 0810 08/24/22 1055 08/25/22 0606 08/26/22 0637  NA 139 139 141 139 141  K 3.9 3.7 3.9 3.8 4.1  CL 104 106 107 106 105  CO2 22 22 24 25 23   GLUCOSE 122* 115* 115* 105* 98  BUN 25* 29* 31* 31* 33*  CREATININE 1.07* 1.13* 1.36* 1.35* 1.25*  CALCIUM 9.0 8.6* 8.8* 8.5* 8.9  MG 1.9 2.1 2.0 1.8 2.0   GFR: Estimated Creatinine Clearance: 26.4 mL/min (A) (by C-G formula based on SCr of 1.25 mg/dL (H)).  Scheduled Meds:  apixaban  5 mg Oral BID   cholecalciferol  2,000 Units Oral Daily   divalproex  125 mg Oral Q8H   dorzolamide-timolol  1 drop Both Eyes BID   feeding supplement  237 mL Oral TID BM   latanoprost  1 drop Both Eyes QHS   levothyroxine  100 mcg Oral Q0600   metoprolol succinate  50 mg Oral Daily   pilocarpine  1 drop Both Eyes QID   thiamine  100 mg Oral Daily   Continuous Infusions: Imaging and lab data was personally reviewed No results found.  LOS: 7 days   Author: Calvert Cantor  08/28/2022 5:45 PM  To contact [facility]>   Check the care team in [facility] and look for the attending/consulting [facility] provider listed  Log into [email or web address] and use [place]'s universal password   Go to> "[facility]"  and find provider  If you still have difficulty reaching the provider, please page the [facility] (Director on Call) for the Hospitalists listed on amion

## 2022-08-29 DIAGNOSIS — G9341 Metabolic encephalopathy: Secondary | ICD-10-CM | POA: Diagnosis not present

## 2022-08-29 MED ORDER — BENAZEPRIL HCL 20 MG PO TABS
20.0000 mg | ORAL_TABLET | Freq: Every day | ORAL | Status: DC
  Administered 2022-08-29 – 2022-09-16 (×18): 20 mg via ORAL
  Filled 2022-08-29 (×20): qty 1

## 2022-08-29 MED ORDER — ENSURE ENLIVE PO LIQD: 237.0000 mL | Freq: Three times a day (TID) | ORAL | 12 refills | Status: DC

## 2022-08-29 MED ORDER — DIVALPROEX SODIUM 125 MG PO CSDR: 125.0000 mg | DELAYED_RELEASE_CAPSULE | Freq: Three times a day (TID) | ORAL | 0 refills | Status: AC

## 2022-08-29 MED ORDER — METOPROLOL SUCCINATE ER 50 MG PO TB24: 50.0000 mg | ORAL_TABLET | Freq: Every day | ORAL | 0 refills | Status: AC

## 2022-08-29 NOTE — Progress Notes (Signed)
Pt with intermittent confusion throughout shift.  Pt states with conviction that people are living in her home stating," how would you feel if you found out a man, women and child were living in your home?"  When questioning her about how she found out about people living in her home she got angry and stated the lawyer would let me know.  Pt has also verbalized she has felt weak at home sitting in chair most of day having difficulty walking to the bathroom.  She also states she has meals brought to her home.  Pt fluctuates between what may be real or fictional.  Pt remains weak, able to ambulate with assistance, incontinent episode prior shift where pt was unaware she had soaked the bed.  Pt is stating she will be discharging home tomorrow and is concerned how she will find a ride home.  This RN is concerned pt will be at home without assistance as she does not seem capable of taking care of herself.

## 2022-08-29 NOTE — Discharge Summary (Signed)
Physician Discharge Summary  [name]:[id number] DOB: 1928-08-04 DOA: 08/19/2022  PCP: [name], MD  Admit date: 08/19/2022 Discharge date: 08/29/2022   Recommendations for Outpatient Follow-up:  She is unsafe to live alone and need 24 hr supervision  Consults:  psychiatry     Discharge Diagnoses:   This is a 89 year old female who is legally blind, has glaucoma, hypertension, atrial fibrillation, history of CVA with residual right-sided weakness, hypothyroidism who presents to the hospital with confusion.  She initially was felt to have a urinary tract infection and was started on ceftriaxone. Over the course of the hospital stay, the patient has continued to exhibit episodes of confusion including delusions along with agitation.  Despite treatment with antibiotics, this does not appear to have resolved.  Of note, the patient was found to have an EF of 30 to 35% during the hospital stay.   Subjective:  No complaints. Again asking when she can go home   Assessment and Plan: Principal Problem:   Paranoid delusions (HCC) -The patient lives alone and we have no detailed history in regards to whether she was having these episodes at home and they have increased in the hospital or whether they are acute - Was evaluated by psychiatry on 7/2 and recommended to be started on Depakote - per psych, she is wanting to die and they have recommended a palliative care consult- will hold off on this for now and attempt to communicate with her further about this  - she is aware she is in the hospital but otherwise is declining to answer orientation questions for me "I don't have to tell you".  - I have spoken with [name] whom the patient has asked me to contact- According to [facility], the patient has had a decline in cognitive status over the past year (cannot give me specific examples)  - over the past few days I have not seen any evidence of delusions.No suicidal ideation on my eval-  - over  the past couple of days based upon my discussion with the RNs and the nursing tech, the patient has not had any hallucinations but does have episodes of confusion - ? If she is developing dementia - She, other wise, appears stable on Depakote   Active Problems:   Poor oral intake - appears to have improved -- she ate half her lunch today>  see RN note from 3:34 today     E-coli UTI -Initially started on ceftriaxone - Transitioned to Augmentin - completed 7 days of treatment    Possible pneumonia - Doxycycline added to treatment regimen on 6/30 based on chest x-ray findings of left lower lobe atelectasis versus infiltrate- completed 5 day course d     Chronic systolic CHF (congestive heart failure) (HCC) Atrial fibrillation, chronic -The echo performed on 6/27 reveals an EF of 30 to 35%-diastolic function is indeterminate - now on Toprol-XL 50 mg daily- holding Amlodipine and Hydralazine - cont Benazepril -Continue apixaban     Acute renal failure superimposed on stage 3a chronic kidney disease (HCC) -Creatinine noted to be as high as 1.36 -Last creatinine documented in epic was from 01/14/2019 and 0.96 at that time -Continue to follow   Hypothyroidism - Mildly elevated TSH - Continue Synthroid at home dose and recheck as outpatient   Legally blind Glaucoma History of CVA             Discharge Instructions   Allergies as of 08/29/2022   No Known Allergies  Medication List     STOP taking these medications    amLODipine 5 MG tablet Commonly known as: NORVASC   atorvastatin 40 MG tablet Commonly known as: LIPITOR   hydrALAZINE 25 MG tablet Commonly known as: APRESOLINE   Vitamin D 50 MCG (2000 UT) Caps       TAKE these medications    benazepril 20 MG tablet Commonly known as: LOTENSIN Take 20 mg by mouth daily.   CENTRUM SILVER PO Take 1 tablet by mouth daily.   divalproex 125 MG capsule Commonly known as: DEPAKOTE SPRINKLE Take 1  capsule (125 mg total) by mouth every 8 (eight) hours.   Eliquis 5 MG Tabs tablet Generic drug: apixaban Take 5 mg by mouth 2 (two) times daily.   feeding supplement Liqd Take 237 mLs by mouth 3 (three) times daily between meals.   Fish Oil 1000 MG Caps Take 1,000 mg by mouth daily.   latanoprost 0.005 % ophthalmic solution Commonly known as: XALATAN Place 1 drop into both eyes at bedtime.   levothyroxine 100 MCG tablet Commonly known as: SYNTHROID Take 1 tablet (100 mcg total) by mouth daily. What changed: when to take this   metoprolol succinate 50 MG 24 hr tablet Commonly known as: TOPROL-XL Take 1 tablet (50 mg total) by mouth daily. Take with or immediately following a meal. Start taking on: August 30, 2022   pilocarpine 1 % ophthalmic solution Commonly known as: PILOCAR Place 1 drop into both eyes 4 (four) times daily.   timolol 0.5 % ophthalmic solution Commonly known as: TIMOPTIC Place 1 drop into both eyes daily.   VITAMIN C PO Take 1 tablet by mouth daily.            The results of significant diagnostics from this hospitalization (including imaging, microbiology, ancillary and laboratory) are listed below for reference.    DG Chest 1 View  Result Date: 08/23/2022 CLINICAL DATA:  89 year old female with history of pneumonia. Shortness of breath. EXAM: CHEST  1 VIEW COMPARISON:  Chest x-ray 04/05/2013. FINDINGS: Poorly defined opacity at the left base partially obscuring the left hemidiaphragm, compatible with atelectasis and/or consolidation, likely with superimposed small left pleural effusion. Right lung is clear. No right pleural effusion. No pneumothorax. No evidence of pulmonary edema. Heart size is mildly enlarged. Upper mediastinal contours are within normal limits. IMPRESSION: 1. Atelectasis and/or consolidation in the left lower lobe. 2. Small left pleural effusion. 3. Mild cardiomegaly. 4. Aortic atherosclerosis. Electronically Signed   By: Trudie Reed M.D.   On: 08/23/2022 12:41   ECHOCARDIOGRAM COMPLETE  Result Date: 08/20/2022    ECHOCARDIOGRAM REPORT   Patient Name:   Amanda Cochran Date of Exam: 08/20/2022 Medical Rec #:  [id number]    Height:       65.0 in Accession #:    [id number]   Weight:       136.9 lb Date of Birth:  1928/11/01    BSA:          1.684 m Patient Age:    89 years     BP:           153/82 mmHg Patient Gender: F            HR:           70 bpm. Exam Location:  Inpatient Procedure: 2D Echo, Cardiac Doppler and Color Doppler Indications:    Arrhythmia  History:        Patient has prior history  of Echocardiogram examinations, most                 recent 01/13/2019. Stroke; Risk Factors:Hypertension.  Sonographer:    Darlys Gales Referring Phys: [id number] DAVID MANUEL ORTIZ IMPRESSIONS  1. Wall motion difficult to assess due to incomplete visualization of the LV endocardium but appears globally hypokinetic on limited view. Left ventricular ejection fraction, by estimation, is 30 to 35%. The left ventricle has moderately decreased function. The left ventricular internal cavity size was mildly dilated. Left ventricular diastolic parameters are indeterminate.  2. Right ventricular systolic function is moderately reduced. The right ventricular size is mildly enlarged. There is mildly elevated pulmonary artery systolic pressure. The estimated right ventricular systolic pressure is 39.2 mmHg.  3. Left atrial size was severely dilated.  4. Right atrial size was severely dilated.  5. The mitral valve is grossly normal. Moderate mitral valve regurgitation.  6. The aortic valve is tricuspid. There is mild calcification of the aortic valve. There is mild thickening of the aortic valve. Aortic valve regurgitation is trivial. Aortic valve sclerosis/calcification is present, without any evidence of aortic stenosis.  7. The inferior vena cava is dilated in size with <50% respiratory variability, suggesting right atrial pressure of 15 mmHg.  Comparison(s): Compared to prior TTE in 2020, the EF has dropped from 50-55% to 30-35%. There is now functional, moderate MR. RV systolic function also now appears reduced. FINDINGS  Left Ventricle: Wall motion difficult to assess due to incomplete visualization of the LV endocardium but appears globally hypokinetic on limited view. Left ventricular ejection fraction, by estimation, is 30 to 35%. The left ventricle has moderately decreased function. The left ventricular internal cavity size was mildly dilated. There is no left ventricular hypertrophy. Left ventricular diastolic parameters are indeterminate. Right Ventricle: The right ventricular size is mildly enlarged. Right vetricular wall thickness was not well visualized. Right ventricular systolic function is moderately reduced. There is mildly elevated pulmonary artery systolic pressure. The tricuspid  regurgitant velocity is 2.46 m/s, and with an assumed right atrial pressure of 15 mmHg, the estimated right ventricular systolic pressure is 39.2 mmHg. Left Atrium: Left atrial size was severely dilated. Right Atrium: Right atrial size was severely dilated. Pericardium: There is no evidence of pericardial effusion. Mitral Valve: The mitral valve is grossly normal. Moderate mitral valve regurgitation. Tricuspid Valve: The tricuspid valve is normal in structure. Tricuspid valve regurgitation is mild. Aortic Valve: The aortic valve is tricuspid. There is mild calcification of the aortic valve. There is mild thickening of the aortic valve. Aortic valve regurgitation is trivial. Aortic regurgitation PHT measures 508 msec. Aortic valve sclerosis/calcification is present, without any evidence of aortic stenosis. Aortic valve mean gradient measures 2.0 mmHg. Aortic valve peak gradient measures 2.8 mmHg. Aortic valve area, by VTI measures 2.00 cm. Pulmonic Valve: The pulmonic valve was not well visualized. Pulmonic valve regurgitation is trivial. Aorta: The aortic root  is normal in size and structure. Venous: The inferior vena cava is dilated in size with less than 50% respiratory variability, suggesting right atrial pressure of 15 mmHg. IAS/Shunts: The atrial septum is grossly normal.  LEFT VENTRICLE PLAX 2D LVIDd:         5.60 cm   Diastology LVIDs:         5.00 cm   LV e' medial:   3.15 cm/s LV PW:         0.80 cm   LV E/e' medial: 28.4 LV IVS:  0.70 cm LVOT diam:     1.70 cm LV SV:         27 LV SV Index:   16 LVOT Area:     2.27 cm  LEFT ATRIUM              Index LA Vol (A2C):   69.6 ml  41.33 ml/m LA Vol (A4C):   120.0 ml 71.27 ml/m LA Biplane Vol: 95.9 ml  56.95 ml/m  AORTIC VALVE AV Area (Vmax):    1.57 cm AV Area (Vmean):   1.72 cm AV Area (VTI):     2.00 cm AV Vmax:           83.20 cm/s AV Vmean:          59.200 cm/s AV VTI:            0.134 m AV Peak Grad:      2.8 mmHg AV Mean Grad:      2.0 mmHg LVOT Vmax:         57.40 cm/s LVOT Vmean:        44.800 cm/s LVOT VTI:          0.118 m LVOT/AV VTI ratio: 0.88 AI PHT:            508 msec  AORTA Ao Root diam: 1.80 cm Ao Asc diam:  2.50 cm MITRAL VALVE               TRICUSPID VALVE MV Area (PHT): 6.48 cm    TR Peak grad:   24.2 mmHg MV Decel Time: 117 msec    TR Vmax:        246.00 cm/s MV E velocity: 89.50 cm/s                            SHUNTS                            Systemic VTI:  0.12 m                            Systemic Diam: 1.70 cm Laurance Flatten MD Electronically signed by Laurance Flatten MD Signature Date/Time: 08/20/2022/2:42:40 PM    Final    CT Head Wo Contrast  Result Date: 08/19/2022 CLINICAL DATA:  Mental status change EXAM: CT HEAD WITHOUT CONTRAST TECHNIQUE: Contiguous axial images were obtained from the base of the skull through the vertex without intravenous contrast. RADIATION DOSE REDUCTION: This exam was performed according to the departmental dose-optimization program which includes automated exposure control, adjustment of the mA and/or kV according to patient size and/or use  of iterative reconstruction technique. COMPARISON:  01/12/2019 CT head FINDINGS: Brain: No evidence of acute infarction, hemorrhage, mass, mass effect, or midline shift. No hydrocephalus or extra-axial fluid collection. Redemonstrated remote right parietal and left basal ganglia infarcts. Periventricular white matter changes, likely the sequela of chronic small vessel ischemic disease. Vascular: No hyperdense vessel. Atherosclerotic calcifications in the intracranial carotid and vertebral arteries. Skull: Negative for fracture or focal lesion. Sinuses/Orbits: Mucosal thickening in the ethmoid air cells. Status post bilateral lens replacements. Other: None. IMPRESSION: No acute intracranial process. Electronically Signed   By: Wiliam Ke M.D.   On: 08/19/2022 19:28   Labs:   Basic Metabolic Panel: Recent Labs  Lab 08/23/22 0810 08/24/22 1055 08/25/22 0606 08/26/22 0637  NA 139  141 139 141  K 3.7 3.9 3.8 4.1  CL 106 107 106 105  CO2 22 24 25 23   GLUCOSE 115* 115* 105* 98  BUN 29* 31* 31* 33*  CREATININE 1.13* 1.36* 1.35* 1.25*  CALCIUM 8.6* 8.8* 8.5* 8.9  MG 2.1 2.0 1.8 2.0     CBC: Recent Labs  Lab 08/23/22 0810 08/24/22 1055 08/25/22 0606 08/26/22 0637  WBC 7.6 10.1 9.3 10.0  NEUTROABS 5.0 7.2 6.4 7.1  HGB 13.1 14.0 13.1 14.3  HCT 41.8 44.8 42.3 46.4*  MCV 93.5 93.1 93.8 93.9  PLT 199 211 195 210         SIGNED:   Calvert Cantor, MD  [facility] 08/29/2022, 12:32 PM

## 2022-08-30 DIAGNOSIS — G9341 Metabolic encephalopathy: Secondary | ICD-10-CM | POA: Diagnosis not present

## 2022-08-30 NOTE — Progress Notes (Addendum)
[facility] Progress Note  Patient: Amanda Cochran     ZOX:[id number]  DOA: 08/19/2022   PCP: [name], MD       Brief hospital course: This is a 89 year old female who is legally blind, has glaucoma, hypertension, atrial fibrillation, history of CVA with residual right-sided weakness, hypothyroidism who presents to the hospital with confusion.  She initially was felt to have a urinary tract infection and was started on ceftriaxone. Over the course of the hospital stay, the patient has continued to exhibit episodes of confusion including delusions along with agitation.  Despite treatment with antibiotics, this does not appear to have resolved.  Of note, the patient was found to have an EF of 30 to 35% during the hospital stay.  Subjective:  Does not answer questions but does yells at me stating "you promised to let me go home but don't let me go home" & "you are all liars".  Assessment and Plan: Principal Problem:   Paranoid delusions (HCC) -The patient lives alone and we have no detailed history in regards to whether she was having these episodes at home and they have increased in the hospital or whether they are acute - Was evaluated by psychiatry on 7/2 and recommended to be started on Depakote - she is aware she is in the hospital but otherwise is otherwise angry about not being able to go home and refuses to converse - I have spoken with [name] whom the patient has asked me to contact- According to [facility], the patient has had a decline in cognitive status over the past year (cannot give me specific examples)  - I have spoken with her brother [name] as well who lives ~ 100 miles away, states he is unable to visit her or care for her- he states he has noted some progressive confusion when he has spoken with her over the phone- he also states that she has always refused to go to a facility to be cared for despite him trying to convinced her- per the chart, she is partly blind  but he states the she was capable of cooking for her self (frying eggs) and take her medication on her own - over the past couple of days based upon my discussion with the RNs and the nursing tech, the patient has not had any further hallucinations- I have not seen any hallucinations or noted any delusions - these may have been secondary to an underlying infection vs dementia - I feel she is stable to be discharged - awaiting disposition from [facility]    Active Problems:  Poor oral intake -  eating intermittently- mainly appears severely depressed    E-coli UTI -Initially started on ceftriaxone - Transitioned to Augmentin - completed 7 days of treatment   Possible pneumonia - Doxycycline added to treatment regimen on 6/30 based on chest x-ray findings of left lower lobe atelectasis versus infiltrate- completed 5 day course d    Chronic systolic CHF (congestive heart failure) (HCC) Atrial fibrillation, chronic -The echo performed on 6/27 reveals an EF of 30 to 35%-diastolic function is indeterminate - Beta-blocker dose increased and now on Toprol-XL 50 mg daily - Continue apixaban    Acute renal failure superimposed on stage 3a chronic kidney disease -Continue to follow  Hypothyroidism - Mildly elevated TSH - Continue Synthroid at home dose and recheck as outpatient  Legally blind Glaucoma History of CVA     Code Status: Full Code Consultants: Psychiatry Level of Care: Level of  care: Med-Surg Total time on patient care: 35 minutes DVT prophylaxis:  SCDs Start: 08/20/22 0644 apixaban (ELIQUIS) tablet 5 mg     Objective:   Vitals:   08/29/22 2107 08/30/22 0536 08/30/22 1121 08/30/22 1346  BP: (!) 152/76 (!) 157/87 (!) 172/99 (!) 165/85  Pulse: 61 71 64 68  Resp: 14 14  18   Temp: 97.8 F (36.6 C) (!) 97.3 F (36.3 C)  (!) 97.5 F (36.4 C)  TempSrc: Oral Oral  Oral  SpO2: 98% 99% 98% 94%  Weight:  61.7 kg    Height:       Filed Weights   08/28/22 0542 08/29/22 0602  08/30/22 0536  Weight: 66.7 kg 60.6 kg 61.7 kg   Exam: General exam: Appears comfortable - sitting with a food tray in front of her but not eating Respiratory system: Clear to auscultation.  Cardiovascular system: S1 & S2 heard  Gastrointestinal system: Abdomen soft, non-tender, nondistended. Normal bowel sounds   Extremities: No cyanosis, clubbing or edema Psychiatry:  agitated/ angry  CBC: Recent Labs  Lab 08/24/22 1055 08/25/22 0606 08/26/22 0637  WBC 10.1 9.3 10.0  NEUTROABS 7.2 6.4 7.1  HGB 14.0 13.1 14.3  HCT 44.8 42.3 46.4*  MCV 93.1 93.8 93.9  PLT 211 195 210    Basic Metabolic Panel: Recent Labs  Lab 08/24/22 1055 08/25/22 0606 08/26/22 0637  NA 141 139 141  K 3.9 3.8 4.1  CL 107 106 105  CO2 24 25 23   GLUCOSE 115* 105* 98  BUN 31* 31* 33*  CREATININE 1.36* 1.35* 1.25*  CALCIUM 8.8* 8.5* 8.9  MG 2.0 1.8 2.0    GFR: Estimated Creatinine Clearance: 26.4 mL/min (A) (by C-G formula based on SCr of 1.25 mg/dL (H)).  Scheduled Meds:  apixaban  5 mg Oral BID   benazepril  20 mg Oral Daily   cholecalciferol  2,000 Units Oral Daily   divalproex  125 mg Oral Q8H   dorzolamide-timolol  1 drop Both Eyes BID   feeding supplement  237 mL Oral TID BM   latanoprost  1 drop Both Eyes QHS   levothyroxine  100 mcg Oral Q0600   metoprolol succinate  50 mg Oral Daily   pilocarpine  1 drop Both Eyes QID   thiamine  100 mg Oral Daily   Continuous Infusions: Imaging and lab data was personally reviewed No results found.  LOS: 9 days   Author: Calvert Cantor  08/30/2022 3:18 PM  To contact [facility]>   Check the care team in [facility] and look for the attending/consulting [facility] provider listed  Log into [email or web address] and use [place]'s universal password   Go to> "[facility]"  and find provider  If you still have difficulty reaching the provider, please page the [facility] (Director on Call) for the Hospitalists listed on amion

## 2022-08-30 NOTE — Progress Notes (Signed)
Amanda Cochran refuses to sit up in the chair and refuses to walk. She states she just wants to go home and they won't let me.

## 2022-08-30 NOTE — Progress Notes (Signed)
Physical Therapy Treatment Patient Details Name: Amanda Cochran MRN: [id number] DOB: 10-20-1928 Today's Date: 08/30/2022   History of Present Illness Pt is a 89yo female presenting to [facility] ED on 6/27 secondary AME with associated UTI, CT head negative for acute findings.   PMH: depression, hx of CVA (L basal ganglia infarct with R hemiparesis), Hld, HTN, hx of left eye lacunar stroke,    PT Comments  Pt requesting assist to use bathroom.  Pt ambulated to/from bathroom with RW and min/guard assist, mostly due to visual impairments.      Assistance Recommended at Discharge Frequent or constant Supervision/Assistance  If plan is discharge home, recommend the following:  Can travel by private vehicle    A little help with walking and/or transfers;A little help with bathing/dressing/bathroom;Assistance with cooking/housework;Direct supervision/assist for medications management;Direct supervision/assist for financial management;Help with stairs or ramp for entrance;Assist for transportation      Equipment Recommendations  None recommended by PT    Recommendations for Other Services       Precautions / Restrictions Precautions Precautions: Fall Precaution Comments: vision impairment     Mobility  Bed Mobility Overal bed mobility: Modified Independent                  Transfers Overall transfer level: Needs assistance Equipment used: Rolling walker (2 wheels) Transfers: Sit to/from Stand Sit to Stand: Min guard           General transfer comment: min/guard for safety, cues for hand placement    Ambulation/Gait Ambulation/Gait assistance: Min guard Gait Distance (Feet): 8 Feet (x2) Assistive device: Rolling walker (2 wheels) Gait Pattern/deviations: Step-through pattern, Decreased stride length, Shuffle, Trunk flexed Gait velocity: decr     General Gait Details: assist to guide RW with amb to bathroom and back to bed, pt reports visual impairments   Stairs              Wheelchair Mobility     Tilt Bed    Modified Rankin (Stroke Patients Only)       Balance                                            Cognition Arousal/Alertness: Awake/alert Behavior During Therapy: WFL for tasks assessed/performed Overall Cognitive Status: No family/caregiver present to determine baseline cognitive functioning                                 General Comments: reports wanting to go home, also stated, "God said he would take care of me"        Exercises      General Comments        Pertinent Vitals/Pain Pain Assessment Pain Assessment: Faces Faces Pain Scale: No hurt Pain Intervention(s): Repositioned, Monitored during session    Home Living                          Prior Function            PT Goals (current goals can now be found in the care plan section) Progress towards PT goals: Progressing toward goals    Frequency    Min 1X/week      PT Plan Current plan remains appropriate    Co-evaluation  AM-PAC PT "6 Clicks" Mobility   Outcome Measure  Help needed turning from your back to your side while in a flat bed without using bedrails?: None Help needed moving from lying on your back to sitting on the side of a flat bed without using bedrails?: None Help needed moving to and from a bed to a chair (including a wheelchair)?: A Little Help needed standing up from a chair using your arms (e.g., wheelchair or bedside chair)?: A Little Help needed to walk in hospital room?: A Little Help needed climbing 3-5 steps with a railing? : A Little 6 Click Score: 20    End of Session   Activity Tolerance: Patient tolerated treatment well Patient left: in bed;with call bell/phone within reach;with bed alarm set   PT Visit Diagnosis: Unsteadiness on feet (R26.81);Difficulty in walking, not elsewhere classified (R26.2);Muscle weakness (generalized) (M62.81)     Time:  1420-1430 PT Time Calculation (min) (ACUTE ONLY): 10 min  Charges:    $Gait Training: 8-22 mins PT General Charges $$ ACUTE PT VISIT: 1 Visit                    Paulino Door, DPT Physical Therapist Acute Rehabilitation Services Office: [phone]    Amanda Cochran [place] 08/30/2022, 3:57 PM

## 2022-08-31 DIAGNOSIS — F22 Delusional disorders: Secondary | ICD-10-CM | POA: Diagnosis not present

## 2022-08-31 LAB — BASIC METABOLIC PANEL
Anion gap: 9 (ref 5–15)
BUN: 28 mg/dL — ABNORMAL HIGH (ref 8–23)
CO2: 26 mmol/L (ref 22–32)
Calcium: 8.5 mg/dL — ABNORMAL LOW (ref 8.9–10.3)
Chloride: 107 mmol/L (ref 98–111)
Creatinine, Ser: 1.13 mg/dL — ABNORMAL HIGH (ref 0.44–1.00)
GFR, Estimated: 46 mL/min — ABNORMAL LOW (ref >60)
Glucose, Bld: 84 mg/dL (ref 70–99)
Potassium: 3.6 mmol/L (ref 3.5–5.1)
Sodium: 142 mmol/L (ref 135–145)

## 2022-08-31 NOTE — Progress Notes (Signed)
Occupational Therapy Treatment Patient Details Name: Amanda Cochran MRN: [id number] DOB: [birth year] Today's Date: 08/31/2022   History of present illness Pt is a 89yo female presenting to [facility] ED on 6/27 secondary AME with associated UTI, CT head negative for acute findings.   PMH: depression, hx of CVA (L basal ganglia infarct with R hemiparesis), Hld, HTN, hx of left eye lacunar stroke,   OT comments  Patient was able to engage in functional activity tolerance training with increased time and encouragement to participate in tasks. Patient has no recollection of participation in therapy during stay here or refusals. Patient remains confused during session and easily agitated with eduction provided from therapist. 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  Patient's discharge plan remains appropriate at this time. OT will continue to follow acutely.     Recommendations for follow up therapy are one component of a multi-disciplinary discharge planning process, led by the attending physician.  Recommendations may be updated based on patient status, additional functional criteria and insurance authorization.    Assistance Recommended at Discharge Frequent or constant Supervision/Assistance  Patient can return home with the following  A little help with walking and/or transfers;A little help with bathing/dressing/bathroom;Assist for transportation;Assistance with cooking/housework;Direct supervision/assist for financial management;Direct supervision/assist for medications management   Equipment Recommendations  None recommended by OT       Precautions / Restrictions Precautions Precautions: Fall Precaution Comments: vision impairment Restrictions Weight Bearing Restrictions: No       Balance Overall balance assessment: No apparent balance deficits (not formally  assessed)     ADL either performed or assessed with clinical judgement   ADL Overall ADL's : Needs assistance/impaired       Lower Body Dressing: Set up;Supervision/safety Lower Body Dressing Details (indicate cue type and reason): sitting EOB with incrased time to don shoes       General ADL Comments: patient was min guard to walk from room in 1601 to window in hallway. patient declined to participate in bathing, or toileting tasks.      Cognition Arousal/Alertness: Awake/alert Behavior During Therapy: WFL for tasks assessed/performed Overall Cognitive Status: No family/caregiver present to determine baseline cognitive functioning       Following Commands: Follows one step commands inconsistently       General Comments: Patient was very argumentative at start of session reporting that she did not need to get ou tof bed or do anything. patient did not endorse that she has been confused during this hospitalization. patient was noted to have confusion during session asking therapist repeat questions. patient was noetd to speak of her mother and how she lived with siblings during session when attempting to ask further about patients topic of choice. patient reported " there is no point in answering".                   Pertinent Vitals/ Pain       Pain Assessment Pain Assessment: No/denies pain         Frequency  Min 1X/week        Progress Toward Goals  OT Goals(current goals can now be found in the care plan section)  Progress towards OT goals: Not progressing toward goals - comment     Plan Discharge plan remains appropriate       AM-PAC OT "6 Clicks" Daily Activity  Outcome Measure   Help from another person eating meals?: A Little Help from another person taking care of personal grooming?: A Little Help from another person toileting, which includes using toliet, bedpan, or urinal?: A Little Help from another person bathing (including washing,  rinsing, drying)?: A Little Help from another person to put on and taking off regular upper body clothing?: A Little Help from another person to put on and taking off regular lower body clothing?: A Little 6 Click Score: 18    End of Session Equipment Utilized During Treatment: Rolling walker (2 wheels);Gait belt  OT Visit Diagnosis: Other symptoms and signs involving cognitive function;Low vision, both eyes (H54.2)   Activity Tolerance Patient tolerated treatment well   Patient Left in chair;with call bell/phone within reach;with chair alarm set   Nurse Communication Mobility status        Time: 1610-9604 OT Time Calculation (min): 20 min  Charges: OT General Charges $OT Visit: 1 Visit OT Treatments $Self Care/Home Management : 8-22 mins  Rosalio Loud, MS Acute Rehabilitation Department Office# [phone]   Selinda Flavin 08/31/2022, 3:50 PM

## 2022-08-31 NOTE — Evaluation (Signed)
Speech Language Pathology Evaluation Patient Details Name: Amanda Cochran MRN: [id number] DOB: April 13, 1928 Today's Date: 08/31/2022 Time: 9629-5284 SLP Time Calculation (min) (ACUTE ONLY): 22 min  Problem List:  Patient Active Problem List   Diagnosis Date Noted   Pneumonia of left lower lobe due to infectious organism 08/23/2022   Chronic systolic CHF (congestive heart failure) (HCC) 08/22/2022   E-coli UTI 08/22/2022   Hypothyroidism 08/22/2022   Paranoid delusion (HCC) 08/21/2022   Acute HFrEF (heart failure with reduced ejection fraction) (HCC) 08/21/2022   Osteoarthritis 08/21/2022   Acute renal failure superimposed on stage 3a chronic kidney disease (HCC) 08/21/2022   Acute metabolic encephalopathy 08/20/2022   Bilateral knee pain 11/27/2021   Pseudohallucination 10/05/2019   Intermediate stage nonexudative age-related macular degeneration of both eyes 10/05/2019   Optic disc atrophy 10/05/2019   Primary open angle glaucoma of both eyes, severe stage 10/05/2019   Low back pain 10/05/2019   MCI (mild cognitive impairment) 10/04/2019   History of CVA (cerebrovascular accident) 01/13/2019   Atrial fibrillation, chronic (HCC) 11/03/2018   Left sided lacunar stroke (HCC) 02/14/2014   Right hemiparesis (HCC) 02/09/2014   Hyperlipidemia    Essential hypertension 02/03/2014   Past Medical History:  Past Medical History:  Diagnosis Date   Arthritis    Bladder infection    Blindness    CVA (cerebral infarction) 02/07/2014   Left basal ganglia infarct with right hemiparesis    Glaucoma    Glaucoma    Glaucoma (increased eye pressure)    Hyperlipidemia    Hypertension    Left sided lacunar stroke (HCC) 02/14/2014   Right hemiparesis (HCC) 02/09/2014   Left basal ganglia infarct Dec 2015    Stroke [facility])    Thyroid disease    Past Surgical History:  Past Surgical History:  Procedure Laterality Date   cataract surgery Bilateral 2015   THYROID SURGERY  1976   TONSILLECTOMY  AND ADENOIDECTOMY     VAGINAL PROLAPSE REPAIR     HPI:  Amanda Cochran is a 89 y.o. female with medical history significant of osteoarthritis, depression, left CVA, glaucoma, hyperlipidemia, hypertension, left eye lacunar stroke, right hemiparesis, left basal ganglia infarct, hypothyroidism who was brought to the emergency department due to paranoid delusions.  She was screaming that there were people in her house.  One of her neighbors heard her scream and called EMS.  When seen, the patient was sleeping in NAD, but arousable.6/26 CT head indicated Brain: No evidence of acute infarction, hemorrhage, mass, mass  effect, or midline shift. No hydrocephalus or extra-axial fluid  collection. Redemonstrated remote right parietal and left basal  ganglia infarcts. Periventricular white matter changes, likely the  sequela of chronic small vessel ischemic disease  ; ST consulted to assess speech/language/ cognition.   Assessment / Plan / Recommendation Clinical Impression  Pt seen for speech/language/cognitive assessment via informal observations, portions of [employer] Mental Status Examination (SLUMS) but no total score obtained d/t pt refusal/perseveration on getting out of hospital, patient report and discussion with neighbor listed in chart Corrie Dandy).  Pt able to follow commands, answer personal information questions, and answer simple problem solving questions with min redirection.  Speech was intelligible within conversation.  Pt was HOH, so repetition required intermittently for pt to understand directives clearly.  Pt stated "people were in my house" and she was distraught about this occurrence.  She is concerned about not being able to return to her home and "being kept here" without resolution.  Reading and graphic expression not assessed due to visual impairment reported by pt. Pt stated she has help with her yard and cleaning the house/obtaining groceries from friends.  Spoke briefly with neighbor  (listed in chart as contact and pt wanted to inform her she was in hospital during session) who voiced concerns re: behaviors she has observed in pt in recent months similar to reports noted by other disciplines (OT, PT) and other staff this hospitalization. Recommend pt have assistance with ADLs/home maintenance once discharged and a full cognitive assessment to determine safety awareness and need for A d/t limited vision and new concerns re: behavior.  ST will s/o in acute setting as needs can be addressed at next venue of care.  Thank you for this consult.    SLP Assessment  SLP Recommendation/Assessment: All further Speech Language Pathology  needs can be addressed in the next venue of care SLP Visit Diagnosis: Cognitive communication deficit (R41.841)    Recommendations for follow up therapy are one component of a multi-disciplinary discharge planning process, led by the attending physician.  Recommendations may be updated based on patient status, additional functional criteria and insurance authorization.    Follow Up Recommendations  Follow physician's recommendations for discharge plan and follow up therapies    Assistance Recommended at Discharge  Other (comment) (TBD)  Functional Status Assessment Patient has had a recent decline in their functional status and demonstrates the ability to make significant improvements in function in a reasonable and predictable amount of time.  Frequency and Duration Other (Comment)         SLP Evaluation Cognition  Overall Cognitive Status: No family/caregiver present to determine baseline cognitive functioning Arousal/Alertness: Awake/alert Orientation Level: Oriented to person;Oriented to place;Disoriented to situation;Disoriented to time Behaviors: Perseveration;Verbal agitation Comments: DTA; pt distracted by "people were in my house" "I just want to go home"       Comprehension  Auditory Comprehension Yes/No Questions: Within Functional  Limits Commands: Not tested Conversation: Simple Interfering Components: Anxiety EffectiveTechniques: Repetition Visual Recognition/Discrimination Discrimination: Not tested Reading Comprehension Reading Status: Not tested    Expression Expression Primary Mode of Expression: Verbal Verbal Expression Overall Verbal Expression: Appears within functional limits for tasks assessed Level of Generative/Spontaneous Verbalization: Conversation Interfering Components: Other (comment) Non-Verbal Means of Communication: Not applicable Written Expression Dominant Hand: Right Written Expression: Not tested   Oral / Motor  Oral Motor/Sensory Function Overall Oral Motor/Sensory Function: Within functional limits Motor Speech Overall Motor Speech: Appears within functional limits for tasks assessed Respiration: Within functional limits Phonation: Hoarse Resonance: Within functional limits Articulation: Within functional limitis Intelligibility: Intelligible Motor Planning: Witnin functional limits Motor Speech Errors: Not applicable Interfering Components: Premorbid status            Pat ,M.S., CCC-SLP 08/31/2022, 2:13 PM

## 2022-08-31 NOTE — Care Management Important Message (Signed)
Important Message  Patient Details IM Letter given. Name: Amanda Cochran MRN: [id number] Date of Birth: 12-28-1928   Medicare Important Message Given:  Yes     Caren Macadam 08/31/2022, 11:03 AM

## 2022-08-31 NOTE — Progress Notes (Signed)
[facility] Progress Note  Patient: Amanda Cochran     ZOX:[id number]  DOA: 08/19/2022   PCP: Amanda Ran, MD       Brief hospital course: This is a 89 year old female who is legally blind, has glaucoma, hypertension, atrial fibrillation, history of CVA with residual right-sided weakness, hypothyroidism who presents to the hospital with confusion.  She initially was felt to have a urinary tract infection and was started on ceftriaxone. Over the course of the hospital stay, the patient has continued to exhibit episodes of confusion including delusions along with agitation.  Despite treatment with antibiotics, this does not appear to have resolved.  Of note, the patient was found to have an EF of 30 to 35% during the hospital stay.  Subjective:  She states she is "doing fine".  Assessment and Plan: Principal Problem:   Paranoid delusions (HCC) -The patient lives alone and we have no detailed history in regards to whether she was having these episodes at home and they have increased in the hospital or whether they are acute - Was evaluated by psychiatry on 7/2 and recommended to be started on Depakote\ - I have spoken with Amanda Cochran whom the patient has asked me to contact- According to [facility], the patient has had a decline in cognitive status over the past year (cannot give me specific examples)  - I have spoken with her brother Amanda Cochran as well who lives ~ 100 miles away, states he is unable to visit her or care for her- he states he has noted some progressive confusion when he has spoken with her over the phone- he does note that she is able to take her medication and is able to Cochran a little- he also states that she has always refused to go to a facility to be cared for despite him trying to convinced her - over the past couple of days based upon my discussion with the RNs and the nursing tech, the patient has not had any further hallucinations- I have not seen any hallucinations or  noted any delusions - these may have been secondary to an underlying infection vs dementia which has now improved with Depakote - I [facility] comes to help her at home) who has noted increased paranoia over the past month which is often worse in the evenings- he also states that she is able to see reasonably well and is able to read and write  -Per nursing reports, the patient still tends to become confused in the late afternoons and evenings -After my discussions with the patient's brother, neighbor and nursing staff, I feel that the patient may have dementia and I do not feel that she is able to return home unless she has 24-hour care     Active Problems:  Poor oral intake -  eating intermittently- mainly appears severely depressed    E-coli UTI -Initially started on ceftriaxone - Transitioned to Augmentin - completed 7 days of treatment   Possible pneumonia - Doxycycline added to treatment regimen on 6/30 based on chest x-ray findings of left lower lobe atelectasis versus infiltrate- completed 5 day course     Chronic systolic CHF (congestive heart failure) (HCC) Atrial fibrillation, chronic -The echo performed on 6/27 reveals an EF of 30 to 35%-diastolic function is indeterminate - Beta-blocker dose increased and now on Toprol-XL 50 mg daily - Continue apixaban    Acute renal failure superimposed on stage 3a chronic kidney disease -Continue to follow  Hypothyroidism - Mildly elevated TSH - Continue Synthroid at home dose and recheck as outpatient  Legally blind Glaucoma History of CVA     Code Status: Full Code Consultants: Psychiatry Level of Care: Level of care: Med-Surg Total time on patient care: 35 minutes DVT prophylaxis:  SCDs Start: 08/20/22 0644 apixaban (ELIQUIS) tablet 5 mg     Objective:   Vitals:   08/30/22 1346 08/30/22 2050 08/31/22 0506 08/31/22 1415  BP: (!) 165/85 128/87 (!) 147/81 (!) 159/81  Pulse: 68 (!) 107 (!) 55 65   Resp: 18 16 14 18   Temp: (!) 97.5 F (36.4 C) 97.7 F (36.5 C) 97.6 F (36.4 C) (!) 97.5 F (36.4 C)  TempSrc: Oral Oral Oral Oral  SpO2: 94% 95% 95% 97%  Weight:   63.7 kg   Height:       Filed Weights   08/29/22 0602 08/30/22 0536 08/31/22 0506  Weight: 60.6 kg 61.7 kg 63.7 kg   Exam: General exam: Appears comfortable  HEENT: oral mucosa moist Respiratory system: Clear to auscultation.  Cardiovascular system: S1 & S2 heard  Gastrointestinal system: Abdomen soft, non-tender, nondistended. Normal bowel sounds   Extremities: No cyanosis, clubbing or edema    CBC: Recent Labs  Lab 08/25/22 0606 08/26/22 0637  WBC 9.3 10.0  NEUTROABS 6.4 7.1  HGB 13.1 14.3  HCT 42.3 46.4*  MCV 93.8 93.9  PLT 195 210    Basic Metabolic Panel: Recent Labs  Lab 08/25/22 0606 08/26/22 0637 08/31/22 0501  NA 139 141 142  K 3.8 4.1 3.6  CL 106 105 107  CO2 25 23 26   GLUCOSE 105* 98 84  BUN 31* 33* 28*  CREATININE 1.35* 1.25* 1.13*  CALCIUM 8.5* 8.9 8.5*  MG 1.8 2.0  --     GFR: Estimated Creatinine Clearance: 29.2 mL/min (A) (by C-G formula based on SCr of 1.13 mg/dL (H)).  Scheduled Meds:  apixaban  5 mg Oral BID   benazepril  20 mg Oral Daily   cholecalciferol  2,000 Units Oral Daily   divalproex  125 mg Oral Q8H   dorzolamide-timolol  1 drop Both Eyes BID   feeding supplement  237 mL Oral TID BM   latanoprost  1 drop Both Eyes QHS   levothyroxine  100 mcg Oral Q0600   metoprolol succinate  50 mg Oral Daily   pilocarpine  1 drop Both Eyes QID   thiamine  100 mg Oral Daily   Continuous Infusions: Imaging and lab data was personally reviewed No results found.  LOS: 10 days   Author: Calvert Cantor  08/31/2022 5:18 PM  To contact [facility]>   Check the care team in [facility] and look for the attending/consulting [facility] provider listed  Log into [email or web address] and use [place]'s universal password   Go to> "[facility]"  and find provider  If you still  have difficulty reaching the provider, please page the [facility] (Director on Call) for the Hospitalists listed on amion

## 2022-08-31 NOTE — TOC Progression Note (Addendum)
Transition of Care [facility]) - Progression Note    Patient Details  Name: Amanda Cochran MRN: [id number] Date of Birth: Feb 03, 1929  Transition of [facility]) CM/SW Contact  Beckie Busing, RN Phone Number:[phone]  08/31/2022, 10:16 AM  Clinical Narrative:    CM at bedside to assess for an appropriate discharge plan. Patient has recommendations for home with home health with 24/7 supervision assistance.Patient states that she lives alone but has a neighbor Information systems manager) and a friend Scientist, research (medical)) that can come to check on her. Patient states that she is going to ask her brother if he will be able to move in with her. Patient gives CM permission to speak with neighbor and Matt. Patient is unable to provide contact numbers. CM called Boykin Nearing (neighbor) per Corrie Dandy she can only call the patient via phone to check on her due to Corrie Dandy states that she is in bad health and unable to offer any assistance to the patient. Corrie Dandy states that she is aware that Susy Frizzle does come to check on patient. Corrie Dandy gives CM contact numbers for Susy Frizzle and his wife Brittney. Susy Frizzle [phone] CM called no answer voicemail has been left. Brayton El [phone]- CM spoke with Britney who confirmed that Britney and Matt cut the patients grass every other week, manages monthly bills, grocery shopping, laundry and household chores. Per Britney these things are done several times per month and in between that they call on a weekly basis. Britney and Susy Frizzle are not family but they have been doing theses things for the patient for the past 9 years and will continue once patient is discharged.   TOC has completed assessment and determine that patient will have neighbor and friends to check in periodically but there is no 24/7 supervision. Patient continues to refuse any type of SNF placement and wants to go home.Patient has not been deemed incapable of making her own decisions. MD has been updated on the above information.   1200 CM at bedside to  offer patient choice for home health services. Patient has no choice, states that she doesn't know anything about it. Home health referral has been accepted by [facility] with Frances Furbish. AVS has been updated.  Expected Discharge Plan: Home/Self Care Barriers to Discharge: Continued Medical Work up  Expected Discharge Plan and Services   Discharge Planning Services: CM Consult   Living arrangements for the past 2 months: Single Family Home Expected Discharge Date: 08/29/22                                     Social Determinants of Health (SDOH) Interventions SDOH Screenings   Food Insecurity: No Food Insecurity (08/21/2022)  Housing: Low Risk  (08/21/2022)  Transportation Needs: No Transportation Needs (08/21/2022)  Recent Concern: Transportation Needs - Unmet Transportation Needs (08/20/2022)  Utilities: Not At Risk (08/21/2022)  Tobacco Use: Low Risk  (08/20/2022)    Readmission Risk Interventions     No data to display

## 2022-09-01 ENCOUNTER — Inpatient Hospital Stay (HOSPITAL_COMMUNITY): Payer: Medicare Other

## 2022-09-01 DIAGNOSIS — F22 Delusional disorders: Secondary | ICD-10-CM | POA: Diagnosis not present

## 2022-09-01 MED ORDER — GADOBUTROL 1 MMOL/ML IV SOLN
6.0000 mL | Freq: Once | INTRAVENOUS | Status: AC | PRN
  Administered 2022-09-01: 6 mL via INTRAVENOUS

## 2022-09-01 MED ORDER — LORAZEPAM 2 MG/ML IJ SOLN: 0.5000 mg | Freq: Once | INTRAMUSCULAR | Status: DC

## 2022-09-01 MED ORDER — LORAZEPAM 0.5 MG PO TABS: 0.5000 mg | ORAL_TABLET | ORAL | Status: DC

## 2022-09-01 NOTE — Consult Note (Signed)
Ophthalmology Consult Note  HPI: Patient is a 89 y.o. female currently admitted to ER for urosepsis.  History of advanced glaucoma.  States she has vision loss that is new today in the left eye. Her right eye has been blind chronically.  Eye exam   [facility] on near card: NLP OD;   LP OS EOM full IOP 9/9  L/L wnl C/S wnl AC deep and quiet Lens normal Vitreous clear  DFE Severe cupping OU with pallor, m/v/p wnl   Assessment and Plan: Severe advanced glaucoma OU Reviewed notes from dr. Lottie Dawson at [facility].  Her baseline vision is 20/250 OS she is on multiple drops with severe visual field changes in both eyes.  No acute intervention  No new headaches, jaw pain, nerve swelling  Suspect continued progression of glaucoma but she is also currently slightly altered  Continue care with Dr. Marti Sleigh [facility]

## 2022-09-01 NOTE — Progress Notes (Signed)
[facility] Progress Note  Patient: Amanda Cochran     GNF:[id number]  DOA: 08/19/2022   PCP: [name], MD       Brief hospital course: This is a 89 year old female who is legally blind, has glaucoma, hypertension, atrial fibrillation, history of CVA with residual right-sided weakness, hypothyroidism who presents to the hospital with confusion.  She initially was felt to have a urinary tract infection and was started on ceftriaxone. Over the course of the hospital stay, the patient has continued to exhibit episodes of confusion including delusions along with agitation.  Despite treatment with antibiotics, this does not appear to have resolved.  Of note, the patient was found to have an EF of 30 to 35% during the hospital stay.  Subjective:  Evaluated this morning.  The patient told me that she had lost vision in her left eye overnight.  She states that she has been blind in her right eye but was able to somewhat see through her left eye.  She now states that her vision is extremely poor, that she is completely blind in that she plans to contact her lawyer because this occurred while she was in the hospital.  Assessment and Plan: Principal Problem:   Paranoid delusions (HCC) -The patient lives alone - Was evaluated by psychiatry on 7/2 and recommended to be started on Depakote\ - I have spoken with [name] whom the patient has asked me to contact- According to [facility], the patient has had a decline in cognitive status over the past year (cannot give me specific examples)  - I have spoken with her brother [name] as well who lives ~ 100 miles away, states he is unable to visit her or care for her- he states he has noted some progressive confusion when he has spoken with her over the phone- he also states that she has always refused to go to a facility to be cared for despite him trying to convinced her - I have spoken with Susy Frizzle [facility] who comes to help her at home) who has noted  increased paranoia over the past month which is often worse in the evenings- she has not been taking her medications and has been hording other things as well- of note, he also states that she is able to see reasonably well and is able to read and write  -Per nursing reports, the patient still tends to become confused in the late afternoons and evenings but no hallucinations have been noted -After my discussions with the patient's brother, neighbor and nursing staff, I feel that the patient may have dementia and I do not feel that she is able to return home unless she has 24-hour care  -I have spoken with psychiatry, Dr. Gasper Sells and asked her to assist me in evaluating this patient's capacity to return home alone when it is likely that she was not taking her medications and now has worsening vision     Active Problems: Loss of vision - today, the patient states she has lost vision in her left eye and is now nearly blind - she also states we have caused her to go blind and she will be calling her lawyer -MRI of the brain: Prior infarcts noted-no acute intracranial abnormality - MRI of the orbits: Proteinaceous cystic lesion in the left medial canthus along the proximal left lacrimal duct, likely sequela of chronic dacryocystitis. -Ophthalmology feels that she may have continued progression of glaucoma  Poor oral intake -  eating intermittently- mainly  appears severely depressed    E-coli UTI -Initially started on ceftriaxone - Transitioned to Augmentin - completed 7 days of treatment   Possible pneumonia - Doxycycline added to treatment regimen on 6/30 based on chest x-ray findings of left lower lobe atelectasis versus infiltrate- completed 5 day course     Chronic systolic CHF (congestive heart failure) (HCC) Atrial fibrillation, chronic Essential hypertension -The echo performed on 6/27 reveals an EF of 30 to 35%-diastolic function is indeterminate - Beta-blocker dose increased and  now on Toprol-XL 50 mg daily - Continue apixaban    Acute renal failure superimposed on stage 3a chronic kidney disease -Improved - Can follow creatinine intermittently  Hypothyroidism - Mildly elevated TSH - Continue Synthroid at home dose and recheck as outpatient -The sheriff's been checking on her states that the patient has not been taking her medications at home  Legally blind History of CVA     Code Status: Full Code Consultants: Psychiatry Level of Care: Level of care: Med-Surg Total time on patient care: 35 minutes DVT prophylaxis:  SCDs Start: 08/20/22 0644 apixaban (ELIQUIS) tablet 5 mg     Objective:   Vitals:   08/31/22 0506 08/31/22 1415 08/31/22 2001 09/01/22 0500  BP: (!) 147/81 (!) 159/81 (!) 143/86 (!) 146/72  Pulse: (!) 55 65 72 (!) 50  Resp: 14 18 16 18   Temp: 97.6 F (36.4 C) (!) 97.5 F (36.4 C) 97.6 F (36.4 C) 97.6 F (36.4 C)  TempSrc: Oral Oral Oral Oral  SpO2: 95% 97% 95% 96%  Weight: 63.7 kg   64.5 kg  Height:       Filed Weights   08/30/22 0536 08/31/22 0506 09/01/22 0500  Weight: 61.7 kg 63.7 kg 64.5 kg   Exam: General exam: Appears comfortable  HEENT: oral mucosa moist-extremely hard of hearing Neuro: I have done a basic visual exam, the patient is unable to discern shapes and can somewhat see colors Respiratory system: Clear to auscultation.  Cardiovascular system: S1 & S2 heard  Gastrointestinal system: Abdomen soft, non-tender, nondistended. Normal bowel sounds   Extremities: No cyanosis, clubbing or edema Psychiatry: Flat affect      CBC: Recent Labs  Lab 08/26/22 0637  WBC 10.0  NEUTROABS 7.1  HGB 14.3  HCT 46.4*  MCV 93.9  PLT 210    Basic Metabolic Panel: Recent Labs  Lab 08/26/22 0637 08/31/22 0501  NA 141 142  K 4.1 3.6  CL 105 107  CO2 23 26  GLUCOSE 98 84  BUN 33* 28*  CREATININE 1.25* 1.13*  CALCIUM 8.9 8.5*  MG 2.0  --     GFR: Estimated Creatinine Clearance: 29.2 mL/min (A) (by C-G  formula based on SCr of 1.13 mg/dL (H)).  Scheduled Meds:  apixaban  5 mg Oral BID   benazepril  20 mg Oral Daily   cholecalciferol  2,000 Units Oral Daily   divalproex  125 mg Oral Q8H   dorzolamide-timolol  1 drop Both Eyes BID   feeding supplement  237 mL Oral TID BM   latanoprost  1 drop Both Eyes QHS   levothyroxine  100 mcg Oral Q0600   metoprolol succinate  50 mg Oral Daily   pilocarpine  1 drop Both Eyes QID   thiamine  100 mg Oral Daily   Continuous Infusions: Imaging and lab data was personally reviewed No results found.  LOS: 11 days   Author: Calvert Cantor  09/01/2022 7:15 AM  To contact [facility]>   Check  the care team in [facility] and look for the attending/consulting [facility] provider listed  Log into [email or web address] and use [place]'s universal password   Go to> "[facility]"  and find provider  If you still have difficulty reaching the provider, please page the [facility] (Director on Call) for the Hospitalists listed on amion

## 2022-09-01 NOTE — Consult Note (Addendum)
[facility] Face-to-Face Psychiatry Consult   Reason for Consult:  Persistin delusions, negative workup for encephalopathy Referring Physician:  Dr. Leafy Half Patient Identification: Amanda Cochran MRN:  [id number] Principal Diagnosis: Acute metabolic encephalopathy Diagnosis:  Principal Problem:   Acute metabolic encephalopathy Active Problems:   Essential hypertension   Hyperlipidemia   Right hemiparesis (HCC)   Left sided lacunar stroke (HCC)   Atrial fibrillation, chronic (HCC)   History of CVA (cerebrovascular accident)   MCI (mild cognitive impairment)   Primary open angle glaucoma of both eyes, severe stage   Paranoid delusion (HCC)   Acute HFrEF (heart failure with reduced ejection fraction) (HCC)   Osteoarthritis   Acute renal failure superimposed on stage 3a chronic kidney disease (HCC)   Chronic systolic CHF (congestive heart failure) (HCC)   E-coli UTI   Hypothyroidism   Pneumonia of left lower lobe due to infectious organism   Total Time spent with patient: 20 minutes  Subjective:   Amanda Cochran is a 89 y.o. female patient admitted with paranoid delusions and found to have a UTI; delusions and hallucinations  have mostly resolved with a combination of antibiotics and depakote. She is still experiencing some delusions overnight per nursing report, but has been alert, oriented, and rational for the last few psychiatry evaluations. Psychiatry had signed off as delirium improved, however we were reconsulted by Dr. Butler Denmark today for capacity.   Patient is seen in late afternoon. She is very hard of hearing and many questions had to be repeated 3-4x. She generally answered questions appropriately.   Patient stated that she "just wants to go home" and feels bad that her house is sitting near empty. she speaks with pride about growing up in poverty and having bought and built her house.  She is upset that she was taken from her house and brought to the hospital.  She understands that the  medical team is recommending for her to go to a nursing home, adamantly disagrees with this suggestion due to her longstanding values of independence (verified with her brother by Dr. Butler Denmark 7/7).  She does not understand many of the particulars of her medical treatment (denies having had a UTI, does not feel she is a fall risk, etc.), but globally understands that she has had significant difficulty taking care of herself and needs additional assistance.  When well, she sought out this assistance via her community (has several neighbors who help her with grocery shopping and yard work) and she indicates that she is open to trying to stay at home with home health support and if not moving in with family.  She is able to state that if she fell at home she would call 911.When I asked her directly if she would rather die in 6 months at home or live for 2 years in a nursing home, she stated that she would rather die sooner with independence.  She denies any suicidal ideation stating "God gave me this life and he will decide when to take me". Patient also expressed a willingness to accept home health services and to move in with family if she is unable to care for herself with the services. Did not endorse HI, AH/VH.   Remainder of interview spent with patient reflecting on the accomplishments over her long life.   I called brother who stated "that is the way she has been for a long time". He does think that she would choose to go home over go to a nursing home in  almost any circumstances. He does confirm that they talk frequently.   HPI:  Amanda Cochran is a 89 y.o. female with medical history significant of osteoarthritis, depression, left CVA, glaucoma, hyperlipidemia, hypertension, left eye lacunar stroke, right hemiparesis, left basal ganglia infarct, hypothyroidism who was brought to the emergency department due to paranoid delusions.  She was screaming that there were people in her house.  One of her neighbors  heard her scream and called EMS.  When seen, the patient was sleeping in NAD, but arousable.  She is unable to elaborate at the moment.  No complaints of pain or dyspnea at the moment.   Past Psychiatric History: UTA  Risk to Self:  decreased self awareness Risk to Others:  denied  Past Medical History:  Past Medical History:  Diagnosis Date   Arthritis    Bladder infection    Blindness    CVA (cerebral infarction) 02/07/2014   Left basal ganglia infarct with right hemiparesis    Glaucoma    Glaucoma    Glaucoma (increased eye pressure)    Hyperlipidemia    Hypertension    Left sided lacunar stroke (HCC) 02/14/2014   Right hemiparesis (HCC) 02/09/2014   Left basal ganglia infarct Dec 2015    Stroke [facility])    Thyroid disease     Past Surgical History:  Procedure Laterality Date   cataract surgery Bilateral 2015   THYROID SURGERY  1976   TONSILLECTOMY AND ADENOIDECTOMY     VAGINAL PROLAPSE REPAIR     Family History:  Family History  Problem Relation Age of Onset   Heart failure Mother    AAA (abdominal aortic aneurysm) Father    Family Psychiatric  History: UTA Social History:  Social History   Substance and Sexual Activity  Alcohol Use No     Social History   Substance and Sexual Activity  Drug Use No    Social History   Socioeconomic History   Marital status: Widowed    Spouse name: deceased   Number of children: 0   Years of education: 14   Highest education level: Not on file  Occupational History   Occupation: retired    Comment: Print production planner  Tobacco Use   Smoking status: Never   Smokeless tobacco: Never  Vaping Use   Vaping Use: Never used  Substance and Sexual Activity   Alcohol use: No   Drug use: No   Sexual activity: Not on file  Other Topics Concern   Not on file  Social History Narrative   Widowed, lives alone   Caffeine use- 1 cup coffee daily   Right handed   No children   Social Determinants of Health   Financial Resource  Strain: Not on file  Food Insecurity: No Food Insecurity (08/21/2022)   Hunger Vital Sign    Worried About Running Out of Food in the Last Year: Never true    Ran Out of Food in the Last Year: Never true  Transportation Needs: No Transportation Needs (08/21/2022)   PRAPARE - Administrator, Civil Service (Medical): No    Lack of Transportation (Non-Medical): No  Recent Concern: Transportation Needs - Unmet Transportation Needs (08/20/2022)   PRAPARE - Administrator, Civil Service (Medical): Yes    Lack of Transportation (Non-Medical): Yes  Physical Activity: Not on file  Stress: Not on file  Social Connections: Not on file   Additional Social History:    Allergies:  No Known Allergies  Labs:  Results for orders placed or performed during the hospital encounter of 08/19/22 (from the past 48 hour(s))  Basic metabolic panel     Status: Abnormal   Collection Time: 08/31/22  5:01 AM  Result Value Ref Range   Sodium 142 135 - 145 mmol/L   Potassium 3.6 3.5 - 5.1 mmol/L   Chloride 107 98 - 111 mmol/L   CO2 26 22 - 32 mmol/L   Glucose, Bld 84 70 - 99 mg/dL    Comment: Glucose reference range applies only to samples taken after fasting for at least 8 hours.   BUN 28 (H) 8 - 23 mg/dL   Creatinine, Ser 9.60 (H) 0.44 - 1.00 mg/dL   Calcium 8.5 (L) 8.9 - 10.3 mg/dL   GFR, Estimated 46 (L) >60 mL/min    Comment: (NOTE) Calculated using the CKD-EPI Creatinine Equation (2021)    Anion gap 9 5 - 15    Comment: Performed at [facility], [address], [place], [place] [address]    Current Facility-Administered Medications  Medication Dose Route Frequency Provider Last Rate Last Admin   acetaminophen (TYLENOL) tablet 650 mg  650 mg Oral Q6H PRN Howerter, Justin B, DO       Or   acetaminophen (TYLENOL) suppository 650 mg  650 mg Rectal Q6H PRN Howerter, Justin B, DO       apixaban (ELIQUIS) tablet 5 mg  5 mg Oral BID Linwood Dibbles, MD   5 mg at  09/01/22 1000   benazepril (LOTENSIN) tablet 20 mg  20 mg Oral Daily Calvert Cantor, MD   20 mg at 09/01/22 1000   cholecalciferol (VITAMIN D3) 25 MCG (1000 UNIT) tablet 2,000 Units  2,000 Units Oral Daily Linwood Dibbles, MD   2,000 Units at 09/01/22 1000   divalproex (DEPAKOTE SPRINKLE) capsule 125 mg  125 mg Oral Q8H Maryagnes Amos, FNP   125 mg at 09/01/22 1436   dorzolamide-timolol (COSOPT) 2-0.5 % ophthalmic solution 1 drop  1 drop Both Eyes BID Linwood Dibbles, MD   1 drop at 09/01/22 1002   feeding supplement (ENSURE ENLIVE / ENSURE PLUS) liquid 237 mL  237 mL Oral TID BM Calvert Cantor, MD   237 mL at 09/01/22 1436   hydrOXYzine (ATARAX) tablet 25 mg  25 mg Oral Q6H PRN Marinda Elk, MD   25 mg at 09/01/22 1208   latanoprost (XALATAN) 0.005 % ophthalmic solution 1 drop  1 drop Both Eyes QHS Linwood Dibbles, MD   1 drop at 08/31/22 2115   levothyroxine (SYNTHROID) tablet 100 mcg  100 mcg Oral Q0600 Linwood Dibbles, MD   100 mcg at 09/01/22 0502   LORazepam (ATIVAN) tablet 0.5 mg  0.5 mg Oral On Call Calvert Cantor, MD       melatonin tablet 10 mg  10 mg Oral QHS PRN Shalhoub, Deno Lunger, MD       metoprolol succinate (TOPROL-XL) 24 hr tablet 50 mg  50 mg Oral Daily Gonfa, Taye T, MD   50 mg at 09/01/22 1000   ondansetron (ZOFRAN) injection 4 mg  4 mg Intravenous Q6H PRN Howerter, Justin B, DO   4 mg at 08/26/22 2330   pilocarpine (PILOCAR) 1 % ophthalmic solution 1 drop  1 drop Both Eyes QID Linwood Dibbles, MD   1 drop at 09/01/22 1437   thiamine (VITAMIN B1) tablet 100 mg  100 mg Oral Daily Candelaria Stagers T, MD   100 mg at 09/01/22 1000  Musculoskeletal: Strength & Muscle Tone: within normal limits Gait & Station:  UTA Patient leans: N/A            Psychiatric Specialty Exam:  Presentation  General Appearance:  Appropriate for Environment; Casual  Eye Contact: Good  Speech: Clear and Coherent; Normal Rate  Speech Volume: Normal  Handedness: Right   Mood and Affect   Mood: Euthymic  Affect: Appropriate; Congruent   Thought Process  Thought Processes: Coherent; Linear  Descriptions of Associations:Intact  Orientation:Full (Time, Place and Person)  Thought Content:Logical  History of Schizophrenia/Schizoaffective disorder:No data recorded Duration of Psychotic Symptoms:No data recorded Hallucinations:No data recorded  Ideas of Reference:None  Suicidal Thoughts:No data recorded  Homicidal Thoughts:No data recorded   Sensorium  Memory: Immediate Fair; Recent Fair; Remote Fair  Judgment: Fair  Insight: Fair   Art therapist  Concentration: Fair  Attention Span: Fair  Recall: Fair  Fund of Knowledge: Good  Language: Good   Psychomotor Activity  Psychomotor Activity: No data recorded   Assets  Assets: Communication Skills; Social Support; Housing; Desire for Improvement   Sleep  Sleep: No data recorded   Physical Exam: Physical Exam Vitals and nursing note reviewed.  Constitutional:      Appearance: Normal appearance. She is normal weight.  HENT:     Head: Normocephalic.  Skin:    General: Skin is warm and dry.  Neurological:     General: No focal deficit present.     Mental Status: She is alert. Mental status is at baseline.  Psychiatric:        Attention and Perception: Perception normal.        Mood and Affect: Mood and affect normal.        Speech: Speech normal.        Behavior: Behavior normal. Behavior is cooperative.        Cognition and Memory: Cognition is impaired. Memory is impaired. She exhibits impaired recent memory.        Judgment: Judgment normal.    Review of Systems  Psychiatric/Behavioral: Negative.    All other systems reviewed and are negative.  Blood pressure (!) 144/63, pulse 63, temperature 98 F (36.7 C), resp. rate 15, height 5\' 5"  (1.651 m), weight 64.5 kg, SpO2 96 %. Body mass index is 23.66 kg/m.  Treatment Plan Summary: Plan    Briefly, this  patient was followed earlier in her hospital course by psychiatry for delirium.  She improved gradually with administration of antibiotics for UTI and Depakote for agitation.  At this point, she is generally alert and oriented throughout the day but continues to exhibit some paranoia and delusions overnight per nursing notes.   Psychiatry had signed off last week, but was reconsulted for capacity by Dr. Butler Denmark as patient is refusing recommendation of going to a nursing home.  While patient does not have capacity by a strict interpretation of the 4 skills model (see below), it is very clear from speaking to patient, nursing staff and patient's family that her current wishes are congruent with her long-held values. There are many valid ethical criticisms of the 4 skills model, including how rigidly cognitive and paternalistic it can be.  When I spoke to pt's brother today, he stated that patient would choose to go home under almost any circumstances.  While discharge home will not be as safe as going to a skilled nursing facility, medical staff should strive to respect pt's wishes as much as possible (despite lacking capacity) and attempt to  discharge with home health services if that is felt to be an acceptable replacement for SNF.   Notably her brother is likely her surrogate Management consultant.   In an evaluation of capacity, each of the following criteria must be met based on medical necessity in order for a patient to have capacity to make the decision in question. Of note, the capacity evaluation assesses only for the specified decision documented above and is not a determination of the patient's overall competency, which can only be adjudicated.  Criterion 1: The patient demonstrates a clear and consistent voluntary choice with regard to treatment options. Yes, wants to go home  Criterion 2: The patient adequately understands the disease they have, the treatment proposed, the risks of treatment, and the  risks of other treatment (including no treatment). No, does not understand she was treated for UTI or confused early in hospital  Criterion 3: The patient acknowledges that the details of Criterion 2 apply to them specifically and the likely consequences of treatment options proposed. No   Criterion 4: The patient demonstrates adequate reasoning/rationality within the context of their decision and can provide justification for their choice. Yes, cites long held values  In this case, the patient does not have capacity to decide to leave AMA to home. . See patient interview above for details.   As always, capacity is both decision and time specific and should not be used as a substitute for competency which can only be adjudicated.  Please do not use this capacity assessment addressing refusal of nursing home care as a substitute for all decisions which this patient may make during this hospitalization.  Recommendations  - Continue delirium precautions.  - Consider PMT consult, to assist with goals of care and EOL wishes.   WIll continue Depakote sprinkles 125mg  po q8, although would likely not continue this outpt. Pt's delirium has improved and will continue to improve in home environment. Last VPA level subtherapeutic but pt with clinical response and hypoalbumenimic.   Psychiatry will leave pt on list and provide warm handoff to oncoming hospitalist, but will likely sign off in 1-2 days if no further questions arise.   Disposition: No evidence of imminent risk to self or others at present.   Patient does not meet criteria for psychiatric inpatient admission. Supportive therapy provided about ongoing stressors.  Claris Che A  09/01/2022 5:02 PM

## 2022-09-02 ENCOUNTER — Inpatient Hospital Stay (HOSPITAL_COMMUNITY): Payer: Medicare Other

## 2022-09-02 DIAGNOSIS — I5022 Chronic systolic (congestive) heart failure: Secondary | ICD-10-CM | POA: Diagnosis not present

## 2022-09-02 DIAGNOSIS — J189 Pneumonia, unspecified organism: Secondary | ICD-10-CM | POA: Diagnosis not present

## 2022-09-02 DIAGNOSIS — E039 Hypothyroidism, unspecified: Secondary | ICD-10-CM

## 2022-09-02 DIAGNOSIS — N390 Urinary tract infection, site not specified: Secondary | ICD-10-CM | POA: Diagnosis not present

## 2022-09-02 DIAGNOSIS — G9341 Metabolic encephalopathy: Secondary | ICD-10-CM | POA: Diagnosis not present

## 2022-09-02 LAB — CBC WITH DIFFERENTIAL/PLATELET
Abs Immature Granulocytes: 0.03 10*3/uL (ref 0.00–0.07)
Basophils Absolute: 0.1 10*3/uL (ref 0.0–0.1)
Basophils Relative: 2 %
Eosinophils Absolute: 0.2 10*3/uL (ref 0.0–0.5)
Eosinophils Relative: 3 %
HCT: 45 % (ref 36.0–46.0)
Hemoglobin: 14 g/dL (ref 12.0–15.0)
Immature Granulocytes: 0 %
Lymphocytes Relative: 23 %
Lymphs Abs: 1.8 10*3/uL (ref 0.7–4.0)
MCH: 29 pg (ref 26.0–34.0)
MCHC: 31.1 g/dL (ref 30.0–36.0)
MCV: 93.4 fL (ref 80.0–100.0)
Monocytes Absolute: 0.5 10*3/uL (ref 0.1–1.0)
Monocytes Relative: 7 %
Neutro Abs: 5.1 10*3/uL (ref 1.7–7.7)
Neutrophils Relative %: 65 %
Platelets: 167 10*3/uL (ref 150–400)
RBC: 4.82 MIL/uL (ref 3.87–5.11)
RDW: 17.4 % — ABNORMAL HIGH (ref 11.5–15.5)
WBC: 7.7 10*3/uL (ref 4.0–10.5)
nRBC: 0 % (ref 0.0–0.2)

## 2022-09-02 LAB — COMPREHENSIVE METABOLIC PANEL
ALT: 23 U/L (ref 0–44)
AST: 23 U/L (ref 15–41)
Albumin: 3 g/dL — ABNORMAL LOW (ref 3.5–5.0)
Alkaline Phosphatase: 37 U/L — ABNORMAL LOW (ref 38–126)
Anion gap: 10 (ref 5–15)
BUN: 25 mg/dL — ABNORMAL HIGH (ref 8–23)
CO2: 27 mmol/L (ref 22–32)
Calcium: 8.5 mg/dL — ABNORMAL LOW (ref 8.9–10.3)
Chloride: 106 mmol/L (ref 98–111)
Creatinine, Ser: 1.15 mg/dL — ABNORMAL HIGH (ref 0.44–1.00)
GFR, Estimated: 45 mL/min — ABNORMAL LOW (ref >60)
Glucose, Bld: 91 mg/dL (ref 70–99)
Potassium: 3.6 mmol/L (ref 3.5–5.1)
Sodium: 143 mmol/L (ref 135–145)
Total Bilirubin: 0.9 mg/dL (ref 0.3–1.2)
Total Protein: 5.7 g/dL — ABNORMAL LOW (ref 6.5–8.1)

## 2022-09-02 LAB — VALPROIC ACID LEVEL: Valproic Acid Lvl: 41 ug/mL — ABNORMAL LOW (ref 50.0–100.0)

## 2022-09-02 LAB — BLOOD GAS, ARTERIAL
Acid-Base Excess: 7.5 mmol/L — ABNORMAL HIGH (ref 0.0–2.0)
Bicarbonate: 31.1 mmol/L — ABNORMAL HIGH (ref 20.0–28.0)
Drawn by: 331471
O2 Content: 2.5 L/min
O2 Saturation: 100 %
Patient temperature: 37
pCO2 arterial: 39 mmHg (ref 32–48)
pH, Arterial: 7.51 — ABNORMAL HIGH (ref 7.35–7.45)
pO2, Arterial: 133 mmHg — ABNORMAL HIGH (ref 83–108)

## 2022-09-02 LAB — TROPONIN I (HIGH SENSITIVITY)
Troponin I (High Sensitivity): 47 ng/L — ABNORMAL HIGH (ref <18)
Troponin I (High Sensitivity): 48 ng/L — ABNORMAL HIGH (ref <18)

## 2022-09-02 LAB — AMMONIA: Ammonia: 26 umol/L (ref 9–35)

## 2022-09-02 NOTE — NC FL2 (Signed)
[place] MEDICAID FL2 LEVEL OF CARE FORM     IDENTIFICATION  Patient Name: Amanda Cochran Birthdate: 1928-03-18 Sex: female Admission Date (Current Location): 08/19/2022  [facility] and [place] Number:  Producer, television/film/video and Address:  [facility],  [address], [place] [address]      Provider Number: [id number]  Attending Physician Name and Address:  Kathlen Mody, MD  Relative Name and Phone Number:  [name] [phone]    Current Level of Care: SNF Recommended Level of Care: Skilled Nursing Facility Prior Approval Number:    Date Approved/Denied:   PASRR Number: [id number]  Discharge Plan: Home    Current Diagnoses: Patient Active Problem List   Diagnosis Date Noted   Pneumonia of left lower lobe due to infectious organism 08/23/2022   Chronic systolic CHF (congestive heart failure) (HCC) 08/22/2022   E-coli UTI 08/22/2022   Hypothyroidism 08/22/2022   Paranoid delusion (HCC) 08/21/2022   Acute HFrEF (heart failure with reduced ejection fraction) (HCC) 08/21/2022   Osteoarthritis 08/21/2022   Acute renal failure superimposed on stage 3a chronic kidney disease (HCC) 08/21/2022   Acute metabolic encephalopathy 08/20/2022   Bilateral knee pain 11/27/2021   Pseudohallucination 10/05/2019   Intermediate stage nonexudative age-related macular degeneration of both eyes 10/05/2019   Optic disc atrophy 10/05/2019   Primary open angle glaucoma of both eyes, severe stage 10/05/2019   Low back pain 10/05/2019   MCI (mild cognitive impairment) 10/04/2019   History of CVA (cerebrovascular accident) 01/13/2019   Atrial fibrillation, chronic (HCC) 11/03/2018   Left sided lacunar stroke (HCC) 02/14/2014   Right hemiparesis (HCC) 02/09/2014   Hyperlipidemia    Essential hypertension 02/03/2014    Orientation RESPIRATION BLADDER Height & Weight     Self, Situation, Place  Normal Continent Weight: 64.1 kg Height:  5\' 5"  (165.1 cm)  BEHAVIORAL  SYMPTOMS/MOOD NEUROLOGICAL BOWEL NUTRITION STATUS     (n/a) Continent Diet  AMBULATORY STATUS COMMUNICATION OF NEEDS Skin   Limited Assist Verbally Normal                       Personal Care Assistance Level of Assistance  Bathing, Feeding, Dressing Bathing Assistance: Limited assistance Feeding assistance: Independent Dressing Assistance: Limited assistance     Functional Limitations Info  Sight, Hearing, Speech Sight Info: Impaired Hearing Info: Impaired Speech Info: Adequate    SPECIAL CARE FACTORS FREQUENCY  PT (By licensed PT), OT (By licensed OT)     PT Frequency: 5X/wk OT Frequency: 5X/wk            Contractures Contractures Info: Not present    Additional Factors Info  Code Status, Allergies, Psychotropic, Insulin Sliding Scale, Isolation Precautions, Suctioning Needs Code Status Info: Full Allergies Info: NKDA Psychotropic Info: see d/c summary Insulin Sliding Scale Info: see d/c summary Isolation Precautions Info: n/a Suctioning Needs: n/a   Current Medications (09/02/2022):  This is the current hospital active medication list Current Facility-Administered Medications  Medication Dose Route Frequency Provider Last Rate Last Admin   acetaminophen (TYLENOL) tablet 650 mg  650 mg Oral Q6H PRN Howerter, Justin B, DO       Or   acetaminophen (TYLENOL) suppository 650 mg  650 mg Rectal Q6H PRN Howerter, Justin B, DO       apixaban (ELIQUIS) tablet 5 mg  5 mg Oral BID Linwood Dibbles, MD   5 mg at 09/01/22 2152   benazepril (LOTENSIN) tablet 20 mg  20 mg Oral Daily  Calvert Cantor, MD   20 mg at 09/01/22 1000   cholecalciferol (VITAMIN D3) 25 MCG (1000 UNIT) tablet 2,000 Units  2,000 Units Oral Daily Linwood Dibbles, MD   2,000 Units at 09/01/22 1000   divalproex (DEPAKOTE SPRINKLE) capsule 125 mg  125 mg Oral Q8H Maryagnes Amos, FNP   125 mg at 09/02/22 0539   dorzolamide-timolol (COSOPT) 2-0.5 % ophthalmic solution 1 drop  1 drop Both Eyes BID Linwood Dibbles, MD    1 drop at 09/01/22 2152   feeding supplement (ENSURE ENLIVE / ENSURE PLUS) liquid 237 mL  237 mL Oral TID BM Rizwan, Ladell Heads, MD   237 mL at 09/01/22 2021   latanoprost (XALATAN) 0.005 % ophthalmic solution 1 drop  1 drop Both Eyes QHS Linwood Dibbles, MD   1 drop at 09/01/22 2152   levothyroxine (SYNTHROID) tablet 100 mcg  100 mcg Oral Q0600 Linwood Dibbles, MD   100 mcg at 09/02/22 0539   melatonin tablet 10 mg  10 mg Oral QHS PRN Shalhoub, Deno Lunger, MD       metoprolol succinate (TOPROL-XL) 24 hr tablet 50 mg  50 mg Oral Daily Candelaria Stagers T, MD   50 mg at 09/01/22 1000   ondansetron (ZOFRAN) injection 4 mg  4 mg Intravenous Q6H PRN Howerter, Justin B, DO   4 mg at 08/26/22 2330   pilocarpine (PILOCAR) 1 % ophthalmic solution 1 drop  1 drop Both Eyes QID Linwood Dibbles, MD   1 drop at 09/01/22 2152   thiamine (VITAMIN B1) tablet 100 mg  100 mg Oral Daily Candelaria Stagers T, MD   100 mg at 09/01/22 1000     Discharge Medications: Please see discharge summary for a list of discharge medications.  Relevant Imaging Results:  Relevant Lab Results:   Additional Information SS# [id number]  Beckie Busing, RN

## 2022-09-02 NOTE — Progress Notes (Signed)
[facility]                                                                               Amanda Cochran, is a 89 y.o. female, DOB - May 03, 1928, WJX:[id number] Admit date - 08/19/2022    Outpatient Primary MD for the patient is [name], MD  LOS - 12  days    Brief summary   89 y.o. female with medical history significant of open-angle glaucoma of both eyes with legal blindness, CVA with some residual right-sided weakness (uses a cane to ambulate), hyperlipidemia, hypertension, atrial fibrillation on Eliquis, hypothyroidism who was brought to the emergency department after she was found screaming in her house at "intruders."    Patient was felt to be suffering from acute metabolic encephalopathy secondary to possible urinary tract infection.  The hospitalist group was called to assess the patient for admission to the hospital.    Patient was placed on intravenous antibiotics with ceftriaxone.  During this hospitalization, echocardiogram revealed markedly low ejection fraction of 30 to 35% with indeterminate diastolic function and.  Patient did not appear to be in acute heart failure.  Case was discussed with cardiology and home regimen of beta-blocker therapy was titrated.  Doxycycline was added to the ceftriaxone later in the hospitalization due to additional concerns for possible pneumonia.  Over the course of the hospitalization the patient has exhibited bouts of agitation and delusions.  Psychiatry was consulted and felt the patient was likely suffering from residual delirium from her underlying illness and did not believe that the patient was suffering an underlying psychiatric condition.       Assessment & Plan    Assessment and Plan:  Acute encephalopathy probably secondary to paranoid delusions, in the setting of decline in cognitive status with some progressive confusion and dementia.  Please see detailed note from [name] who has spoken to the patient's  brother and the [facility] regarding her mental status.  She was seen by psychiatry recommended to start the patient on Depakote.On exam this morning , patient is more lethargic and answering only yes or No to questions .  Please check ABG, ammonia, CBC and cmp .  Cxr does not show any pneumonia.  MRI brain is negative for acute stroke.  Suspect probably from melatonin, which was discontinued.  Psychiatry consulted for capacity evaluation and recommendations given.     Vision loss in the left eye.  Chronic blindness in the right eye.  Suspect progression of glaucoma as per [name] with Ophthalmology.  Recommended outpatient follow up with [name].    Ecoli UTI Completed the course of antibiotics.   Possible pneumonia Completed 5 day sof doxycycline.    AKI on stage 3a CKD Creatinine back to baseline.   Chronic systolic CHF: LAST EF is 30 to 35% with indeterminate diastolic dysfunction.  Continue with eliquis and BB.    Hypothyroidism:  Resume synthroid.    H/o CVA;       Estimated body mass index is 23.52 kg/m as calculated from the following:   Height as of this encounter: 5\' 5"  (1.651 m).   Weight as of this encounter: 64.1 kg.  Code  Status: full code.  DVT Prophylaxis:  SCDs Start: 08/20/22 0644 apixaban (ELIQUIS) tablet 5 mg   Level of Care: Level of care: Med-Surg Family Communication: None at bedside.   Disposition Plan:     Remains inpatient appropriate:  [facility]  Procedures:  None.   Consultants:   Psychiatry.  Antimicrobials:   Anti-infectives (From admission, onward)    Start     Dose/Rate Route Frequency Ordered Stop   08/24/22 2200  amoxicillin-clavulanate (AUGMENTIN) 500-125 MG per tablet 1 tablet        1 tablet Oral 2 times daily 08/24/22 1427 08/26/22 2228   08/24/22 1000  amoxicillin-clavulanate (AUGMENTIN) 875-125 MG per tablet 1 tablet  Status:  Discontinued        1 tablet Oral Every 12 hours 08/23/22 1255 08/24/22 1427   08/23/22  1345  doxycycline (VIBRA-TABS) tablet 100 mg  Status:  Discontinued        100 mg Oral Every 12 hours 08/23/22 1255 08/27/22 1425   08/21/22 0800  cefTRIAXone (ROCEPHIN) 1 g in sodium chloride 0.9 % 100 mL IVPB  Status:  Discontinued        1 g 200 mL/hr over 30 Minutes Intravenous Every 24 hours 08/20/22 0644 08/23/22 1255   08/20/22 0600  cefTRIAXone (ROCEPHIN) 1 g in sodium chloride 0.9 % 100 mL IVPB        1 g 200 mL/hr over 30 Minutes Intravenous  Once 08/20/22 0557 08/20/22 0808        Medications  Scheduled Meds:  apixaban  5 mg Oral BID   benazepril  20 mg Oral Daily   cholecalciferol  2,000 Units Oral Daily   divalproex  125 mg Oral Q8H   dorzolamide-timolol  1 drop Both Eyes BID   feeding supplement  237 mL Oral TID BM   latanoprost  1 drop Both Eyes QHS   levothyroxine  100 mcg Oral Q0600   metoprolol succinate  50 mg Oral Daily   pilocarpine  1 drop Both Eyes QID   thiamine  100 mg Oral Daily   Continuous Infusions: PRN Meds:.acetaminophen **OR** acetaminophen, melatonin, ondansetron (ZOFRAN) IV    Subjective:   Amanda Cochran was seen and examined today.  Sleepy and lethargic.   Objective:   Vitals:   09/01/22 1951 09/02/22 0449 09/02/22 0459 09/02/22 1257  BP: (!) 150/82 (!) 154/82  (!) 156/67  Pulse: (!) 56 (!) 58  60  Resp: 18 18  18   Temp: 97.7 F (36.5 C) 97.8 F (36.6 C)  97.7 F (36.5 C)  TempSrc: Oral Oral  Oral  SpO2: 94% 90%  100%  Weight:   64.1 kg   Height:        Intake/Output Summary (Last 24 hours) at 09/02/2022 1455 Last data filed at 09/02/2022 1257 Gross per 24 hour  Intake 300 ml  Output --  Net 300 ml   Filed Weights   08/31/22 0506 09/01/22 0500 09/02/22 0459  Weight: 63.7 kg 64.5 kg 64.1 kg     Exam General exam: elderly frail lady,  ill appearing, not in distress.  Respiratory system: Clear to auscultation. Respiratory effort normal. Cardiovascular system: S1 & S2 heard, RRR.  Gastrointestinal system: Abdomen is  nondistended, soft and nontender.  Central nervous system: lethargic, able to move all extremities.  Extremities: no pedal edema.  Skin: No rashes,  Psychiatry: unable to assess due to lethargy.    Data Reviewed:  I have personally reviewed following labs and imaging studies  CBC Lab Results  Component Value Date   WBC 7.7 09/02/2022   RBC 4.82 09/02/2022   HGB 14.0 09/02/2022   HCT 45.0 09/02/2022   MCV 93.4 09/02/2022   MCH 29.0 09/02/2022   PLT 167 09/02/2022   MCHC 31.1 09/02/2022   RDW 17.4 (H) 09/02/2022   LYMPHSABS 1.8 09/02/2022   MONOABS 0.5 09/02/2022   EOSABS 0.2 09/02/2022   BASOSABS 0.1 09/02/2022     Last metabolic panel Lab Results  Component Value Date   NA 143 09/02/2022   K 3.6 09/02/2022   CL 106 09/02/2022   CO2 27 09/02/2022   BUN 25 (H) 09/02/2022   CREATININE 1.15 (H) 09/02/2022   GLUCOSE 91 09/02/2022   GFRNONAA 45 (L) 09/02/2022   GFRAA >60 01/14/2019   CALCIUM 8.5 (L) 09/02/2022   PHOS 3.4 08/21/2022   PROT 5.7 (L) 09/02/2022   ALBUMIN 3.0 (L) 09/02/2022   BILITOT 0.9 09/02/2022   ALKPHOS 37 (L) 09/02/2022   AST 23 09/02/2022   ALT 23 09/02/2022   ANIONGAP 10 09/02/2022    CBG (last 3)  No results for input(s): "GLUCAP" in the last 72 hours.    Coagulation Profile: No results for input(s): "INR", "PROTIME" in the last 168 hours.   Radiology Studies: DG CHEST PORT 1 VIEW  Result Date: 09/02/2022 CLINICAL DATA:  Dyspnea EXAM: PORTABLE CHEST 1 VIEW COMPARISON:  08/23/2022 FINDINGS: Cardiac shadow is enlarged but stable. Aortic calcifications are again seen. Lungs are well aerated bilaterally. No focal infiltrate or effusion is seen. No bony abnormality is noted. IMPRESSION: No active disease. Electronically Signed   By: Alcide Clever M.D.   On: 09/02/2022 11:28   MR BRAIN WO CONTRAST  Result Date: 09/01/2022 CLINICAL DATA:  Delirium; Vision loss, known etiology. EXAM: MRI HEAD WITHOUT CONTRAST MRI ORBITS WITHOUT AND WITH  CONTRAST TECHNIQUE: Multiplanar, multiecho pulse sequences of the brain and surrounding structures were obtained without intravenous contrast. Multiplanar, multiecho pulse sequences of the orbits and surrounding structures were obtained including fat saturation techniques, before and after intravenous contrast administration. CONTRAST:  6mL GADAVIST GADOBUTROL 1 MMOL/ML IV SOLN COMPARISON:  Head CT 08/19/2022. FINDINGS: MRI HEAD FINDINGS Brain: No acute infarct or hemorrhage. Scattered areas old infarction in the left posterior limb of the internal capsule and corona radiata, bilateral occipital lobes, bilateral temporal lobes, and inferior right parietal lobe. Underlying moderate to severe chronic small-vessel disease. No acute hydrocephalus or extra-axial collection. Vascular: Normal flow voids. Skull and upper cervical spine: Normal marrow signal. Other: None. MRI ORBITS FINDINGS Orbits: No traumatic or inflammatory finding. Globes, optic nerves, orbital fat, extraocular muscles, vascular structures, and lacrimal glands are normal. Proteinaceous cystic lesion in the left medial canthus along the proximal left lacrimal duct, likely sequela of chronic dacryocystitis. Visualized sinuses: Unremarkable. Soft tissues: Unremarkable. IMPRESSION: 1. No acute intracranial abnormality or mass. 2. No acute abnormality in the orbits. 3. Moderate to severe chronic small-vessel disease with scattered areas of old infarction throughout the brain. 4. Proteinaceous cystic lesion in the left medial canthus along the proximal left lacrimal duct, likely sequela of chronic dacryocystitis. Electronically Signed   By: Orvan Falconer M.D.   On: 09/01/2022 16:18   MR ORBITS W WO CONTRAST  Result Date: 09/01/2022 CLINICAL DATA:  Delirium; Vision loss, known etiology. EXAM: MRI HEAD WITHOUT CONTRAST MRI ORBITS WITHOUT AND WITH CONTRAST TECHNIQUE: Multiplanar, multiecho pulse sequences of the brain and surrounding structures were  obtained without intravenous contrast. Multiplanar, multiecho pulse sequences of the  orbits and surrounding structures were obtained including fat saturation techniques, before and after intravenous contrast administration. CONTRAST:  6mL GADAVIST GADOBUTROL 1 MMOL/ML IV SOLN COMPARISON:  Head CT 08/19/2022. FINDINGS: MRI HEAD FINDINGS Brain: No acute infarct or hemorrhage. Scattered areas old infarction in the left posterior limb of the internal capsule and corona radiata, bilateral occipital lobes, bilateral temporal lobes, and inferior right parietal lobe. Underlying moderate to severe chronic small-vessel disease. No acute hydrocephalus or extra-axial collection. Vascular: Normal flow voids. Skull and upper cervical spine: Normal marrow signal. Other: None. MRI ORBITS FINDINGS Orbits: No traumatic or inflammatory finding. Globes, optic nerves, orbital fat, extraocular muscles, vascular structures, and lacrimal glands are normal. Proteinaceous cystic lesion in the left medial canthus along the proximal left lacrimal duct, likely sequela of chronic dacryocystitis. Visualized sinuses: Unremarkable. Soft tissues: Unremarkable. IMPRESSION: 1. No acute intracranial abnormality or mass. 2. No acute abnormality in the orbits. 3. Moderate to severe chronic small-vessel disease with scattered areas of old infarction throughout the brain. 4. Proteinaceous cystic lesion in the left medial canthus along the proximal left lacrimal duct, likely sequela of chronic dacryocystitis. Electronically Signed   By: Orvan Falconer M.D.   On: 09/01/2022 16:18   DG Abd 1 View  Result Date: 09/01/2022 CLINICAL DATA:  Delirium.  Pre MRI screening. EXAM: ABDOMEN - 1 VIEW COMPARISON:  01/06/2022 FINDINGS: Single view of the abdomen was obtained. Nonobstructive bowel gas pattern. No surgical hardware in the lumbar spine or lower thoracic spine. Cardiac silhouette is prominent based on this examination. Atherosclerotic calcifications  involving descending thoracic aorta. Degenerative changes in lower lumbar spine. No evidence for a radiopaque foreign body. Rounded density on the right side of the abdomen may be above the kidney but difficult to exclude a renal calculus or even a calcified gallstone. IMPRESSION: 1. No evidence for a radiopaque or metallic foreign body. 2. Nonobstructive bowel gas pattern. 3. Possible right renal calculus or gallstone. Electronically Signed   By: Richarda Overlie M.D.   On: 09/01/2022 09:44   DG Neck Soft Tissue  Result Date: 09/01/2022 CLINICAL DATA:  Delirium.  Pre MRI screening. EXAM: NECK SOFT TISSUES - 1+ VIEW COMPARISON:  Head CT 08/19/2022 FINDINGS: AP view of the cervical spine demonstrates dental fillings and dental hardware. Prominent calcifications along the right side of the neck are likely associated with the carotid arteries. Evidence for multilevel degenerative changes in the cervical spine. No evidence for surgical hardware in the cervical spine. Scarring at both lung apices. IMPRESSION: 1. No evidence for surgical hardware in the cervical spine. 2. Prominent calcifications along the right side of the neck are likely associated with the carotid arteries. Electronically Signed   By: Richarda Overlie M.D.   On: 09/01/2022 09:40   DG Pelvis 1-2 Views  Result Date: 09/01/2022 CLINICAL DATA:  Delirium.  Pre MRI screening. EXAM: PELVIS - 1-2 VIEW COMPARISON:  None Available. FINDINGS: Pelvic bony structures are intact. Lucency overlying the left inferior pubic ramus probably related to Mach line. No definite acute fracture or dislocation. Osteoarthritis in both hips particularly in the right superolateral hip. No evidence for a radiopaque foreign body. IMPRESSION: 1. No evidence for a radiopaque or metallic foreign body. Electronically Signed   By: Richarda Overlie M.D.   On: 09/01/2022 09:37       Kathlen Mody M.D. [facility] 09/02/2022, 2:55 PM  Available via Epic secure chat 7am-7pm After 7 pm,  please refer to night coverage provider listed on amion.

## 2022-09-02 NOTE — Consult Note (Signed)
Brief Psychiatry Consult Note  The patient was last seen by the psychiatry service on 7/11 - see that note for detailed assessment. I left her on the list for continuity (primary team attending changed) and discussed case with new attending and TOC this morning; there are no further questions. Psychiatry signing off.    A 

## 2022-09-02 NOTE — Progress Notes (Signed)
Complaining of chest pain radiating to back, and shortness of breath.  VSS stable, EKG completed MD at bedside with new orders made and carried out

## 2022-09-03 DIAGNOSIS — G9341 Metabolic encephalopathy: Secondary | ICD-10-CM | POA: Diagnosis not present

## 2022-09-03 DIAGNOSIS — N390 Urinary tract infection, site not specified: Secondary | ICD-10-CM | POA: Diagnosis not present

## 2022-09-03 DIAGNOSIS — I5022 Chronic systolic (congestive) heart failure: Secondary | ICD-10-CM | POA: Diagnosis not present

## 2022-09-03 DIAGNOSIS — J189 Pneumonia, unspecified organism: Secondary | ICD-10-CM | POA: Diagnosis not present

## 2022-09-03 NOTE — Plan of Care (Signed)
Patient has been sleepy today. O2 on for comfort. States that she feels more tired then usual. Out of bed with 1 assist to the bathroom. Refused to get out of bed to the chair or walk in the hallway. She ate breakfast,lunch, and dinner with about 25 % eaten per meal with an additional Ensure and water for hydration. She fluctuates between wanting to go back to her house to wanting to die.

## 2022-09-03 NOTE — Progress Notes (Signed)
OT Cancellation Note  Patient Details Name: Amanda Cochran MRN: [id number] DOB: [birth year]   Cancelled Treatment:    Reason Eval/Treat Not Completed: Fatigue/lethargy limiting ability to participate: Pt sleeping lightly but awaoke quickly to name.  Pt asking to stay napping stating that she was fatigued from tests and a visitor today. Pt repeating self asking, "What have you come here to say?" Role of OT explained and pt asked this question 3 more times.  Will continue efforts but may need to consider OT sign off.   Theodoro Clock 09/03/2022, 2:37 PM

## 2022-09-03 NOTE — Consult Note (Signed)
Consultation Note Date: 09/03/2022   Patient Name: Amanda Cochran  DOB: [birth year]  MRN: [id number]  Age / Sex: 89 y.o., female  PCP: [name], MD Referring Physician: Kathlen Mody, MD  Reason for Consultation: Establishing goals of care Amanda Cochran is a 89 y.o. female with medical history significant of osteoarthritis, depression, left CVA, glaucoma, hyperlipidemia, hypertension, left eye lacunar stroke, right hemiparesis, left basal ganglia infarct, hypothyroidism who was brought to the emergency department due to paranoid delusions.  She was screaming that there were people in her house.  One of her neighbors heard her scream and called EMS.  When seen, the patient was sleeping in NAD, but arousable.   HPI/Patient Profile: 89 y.o. female admitted on 08/19/2022 .   Clinical Assessment and Goals of Care: Discussed with TR[facility] MD, chart reviewed, PMT consult request received, patient seen and examined.  Palliative medicine is specialized medical care for people living with serious illness. It focuses on providing relief from the symptoms and stress of a serious illness. The goal is to improve quality of life for both the patient and the family. Goals of care: Broad aims of medical therapy in relation to the patient's values and preferences. Our aim is to provide medical care aimed at enabling patients to achieve the goals that matter most to them, given the circumstances of their particular medical situation and their constraints.    NEXT OF KIN  Nephew [name] [phone].   SUMMARY OF RECOMMENDATIONS    Goals of care discussions attempted with patient, while she awakens and interacts, she doesn't seem capable of extensive discussions. Patient keeps on repeating that she wants to go home. She states that she has "a lot of help lined up" and that she is not alone at home.  In light of the patient's  current condition and current functional status and keeping in mind scope of current hospitalization, the safest possible option appears to be skilled nursing facility for rehabilitation attempt with palliative services following.  PMT to continue ongoing goals of care discussions with patient and next of kin Thank you for the consult.  Code Status/Advance Care Planning: Full code   Symptom Management:    Palliative Prophylaxis:  Delirium Protocol  Additional Recommendations (Limitations, Scope, Preferences): Full Scope Treatment  Psycho-social/Spiritual:  Desire for further Chaplaincy support:yes Additional Recommendations: Caregiving  Support/Resources  Prognosis:  Unable to determine  Discharge Planning: To Be Determined      Primary Diagnoses: Present on Admission:  Acute metabolic encephalopathy  Atrial fibrillation, chronic (HCC)  Essential hypertension  Hyperlipidemia  MCI (mild cognitive impairment)  Left sided lacunar stroke (HCC)  Primary open angle glaucoma of both eyes, severe stage  Paranoid delusion (HCC)  Acute renal failure superimposed on stage 3a chronic kidney disease (HCC)  Chronic systolic CHF (congestive heart failure) (HCC)  E-coli UTI  Hypothyroidism  Pneumonia of left lower lobe due to infectious organism   I have reviewed the medical record, interviewed the patient and family, and examined the  patient. The following aspects are pertinent.  Past Medical History:  Diagnosis Date   Arthritis    Bladder infection    Blindness    CVA (cerebral infarction) 02/07/2014   Left basal ganglia infarct with right hemiparesis    Glaucoma    Glaucoma    Glaucoma (increased eye pressure)    Hyperlipidemia    Hypertension    Left sided lacunar stroke (HCC) 02/14/2014   Right hemiparesis (HCC) 02/09/2014   Left basal ganglia infarct Dec 2015    Stroke [facility])    Thyroid disease    Social History   Socioeconomic History   Marital status: Widowed     Spouse name: deceased   Number of children: 0   Years of education: 14   Highest education level: Not on file  Occupational History   Occupation: retired    Comment: Print production planner  Tobacco Use   Smoking status: Never   Smokeless tobacco: Never  Vaping Use   Vaping status: Never Used  Substance and Sexual Activity   Alcohol use: No   Drug use: No   Sexual activity: Not on file  Other Topics Concern   Not on file  Social History Narrative   Widowed, lives alone   Caffeine use- 1 cup coffee daily   Right handed   No children   Social Determinants of Health   Financial Resource Strain: Not on file  Food Insecurity: No Food Insecurity (08/21/2022)   Hunger Vital Sign    Worried About Running Out of Food in the Last Year: Never true    Ran Out of Food in the Last Year: Never true  Transportation Needs: No Transportation Needs (08/21/2022)   PRAPARE - Administrator, Civil Service (Medical): No    Lack of Transportation (Non-Medical): No  Recent Concern: Transportation Needs - Unmet Transportation Needs (08/20/2022)   PRAPARE - Administrator, Civil Service (Medical): Yes    Lack of Transportation (Non-Medical): Yes  Physical Activity: Not on file  Stress: Not on file  Social Connections: Not on file   Family History  Problem Relation Age of Onset   Heart failure Mother    AAA (abdominal aortic aneurysm) Father    Scheduled Meds:  apixaban  5 mg Oral BID   benazepril  20 mg Oral Daily   cholecalciferol  2,000 Units Oral Daily   divalproex  125 mg Oral Q8H   dorzolamide-timolol  1 drop Both Eyes BID   feeding supplement  237 mL Oral TID BM   latanoprost  1 drop Both Eyes QHS   levothyroxine  100 mcg Oral Q0600   metoprolol succinate  50 mg Oral Daily   pilocarpine  1 drop Both Eyes QID   thiamine  100 mg Oral Daily   Continuous Infusions: PRN Meds:.acetaminophen **OR** acetaminophen, ondansetron (ZOFRAN) IV Medications Prior to Admission:   Prior to Admission medications   Medication Sig Start Date End Date Taking? Authorizing Provider  amLODipine (NORVASC) 5 MG tablet Take 1 tablet by mouth daily. 03/03/18  Yes [provider]  Ascorbic Acid (VITAMIN C PO) Take 1 tablet by mouth daily.    Yes [provider]  benazepril (LOTENSIN) 20 MG tablet Take 20 mg by mouth daily.   Yes [provider]  Cholecalciferol (VITAMIN D) 2000 UNITS CAPS Take 1 capsule by mouth daily.   Yes [provider]  ELIQUIS 5 MG TABS tablet Take 5 mg by mouth 2 (two) times  daily. 01/16/19  Yes [provider]  latanoprost (XALATAN) 0.005 % ophthalmic solution Place 1 drop into both eyes at bedtime.    Yes [provider]  levothyroxine (SYNTHROID, LEVOTHROID) 100 MCG tablet Take 1 tablet (100 mcg total) by mouth daily. Patient taking differently: Take 100 mcg by mouth daily before breakfast. 02/19/14  Yes Angiulli, Mcarthur Rossetti, PA-C  Multiple Vitamins-Minerals (CENTRUM SILVER PO) Take 1 tablet by mouth daily.   Yes [provider]  Omega-3 Fatty Acids (FISH OIL) 1000 MG CAPS Take 1,000 mg by mouth daily.    Yes [provider]  pilocarpine (PILOCAR) 1 % ophthalmic solution Place 1 drop into both eyes 4 (four) times daily.  08/06/16  Yes [provider]  timolol (TIMOPTIC) 0.5 % ophthalmic solution Place 1 drop into both eyes daily. 03/18/22  Yes [provider]  atorvastatin (LIPITOR) 40 MG tablet Take 1 tablet (40 mg total) by mouth daily at 6 PM. Patient not taking: Reported on 08/19/2022 01/14/19 01/15/29  Jerald Kief, MD  divalproex (DEPAKOTE SPRINKLE) 125 MG capsule Take 1 capsule (125 mg total) by mouth every 8 (eight) hours. 08/29/22 09/28/22  Calvert Cantor, MD  feeding supplement (ENSURE ENLIVE / ENSURE PLUS) LIQD Take 237 mLs by mouth 3 (three) times daily between meals. 08/29/22   Calvert Cantor, MD  hydrALAZINE (APRESOLINE) 25 MG tablet Take 1 tablet (25 mg total) by  mouth every 8 (eight) hours. Patient not taking: Reported on 08/19/2022 02/19/14   Angiulli, Mcarthur Rossetti, PA-C  metoprolol succinate (TOPROL-XL) 50 MG 24 hr tablet Take 1 tablet (50 mg total) by mouth daily. Take with or immediately following a meal. 08/30/22   Calvert Cantor, MD   No Known Allergies Review of Systems +weakness  Physical Exam Elderly lady, hard of hearing resting in bed Laying in bed with eyes closed, does not verbalize or engage much.  When she does awaken, she answers appropriately and interacts appropriately. Regular work of breathing No peripheral edema Abdomen is not distended  Vital Signs: BP (!) 149/67 (BP Location: Left Arm)   Pulse (!) 56   Temp 97.8 F (36.6 C) (Oral)   Resp 14   Ht 5\' 5"  (1.651 m)   Wt 63.6 kg   SpO2 98%   BMI 23.33 kg/m  Pain Scale: 0-10 POSS *See Group Information*: 1-Acceptable,Awake and alert Pain Score: 0-No pain   SpO2: SpO2: 98 % O2 Device:SpO2: 98 % O2 Flow Rate: .O2 Flow Rate (L/min): 2 L/min  IO: Intake/output summary: No intake or output data in the 24 hours ending 09/03/22 1536  LBM: Last BM Date : 09/01/22 Baseline Weight: Weight: 70 kg Most recent weight: Weight: 63.6 kg     Palliative Assessment/Data:   Palliative performance scale 50%.  Time In: 15.20 Time Out: 16.20 Time Total: 60 Greater than 50%  of this time was spent counseling and coordinating care related to the above assessment and plan.  Signed by: Rosalin Hawking, MD   Please contact Palliative Medicine Team phone at [phone] for questions and concerns.  For individual provider: See Loretha Stapler

## 2022-09-03 NOTE — Plan of Care (Signed)
Pt resting with eyes closed most of shift, ambulated with minimal assist to toilet 1x, with hand on assist.  Pt expresses concern about dying and where she will go at discharge in tearful tones. Pt taking sips of fluid when offered.

## 2022-09-03 NOTE — Progress Notes (Signed)
Physical Therapy Treatment Patient Details Name: Amanda Cochran MRN: [id number] DOB: 1928/08/20 Today's Date: 09/03/2022   History of Present Illness Pt is a 89yo female presenting [facility] ED on 6/27 secondary AME with associated UTI, CT head negative for acute findings.   PMH: depression, hx of CVA (L basal ganglia infarct with R hemiparesis), Hld, HTN, hx of left eye lacunar stroke,    PT Comments  Pt initially wanting to rest however then requested assist to bathroom.  Pt provided min assist due to visual impairment and bil feet pain.  Pt reports pain in her feet limiting her mobility at this time.     Assistance Recommended at Discharge Frequent or constant Supervision/Assistance  If plan is discharge home, recommend the following:  Can travel by private vehicle    A little help with walking and/or transfers;A little help with bathing/dressing/bathroom;Assistance with cooking/housework;Direct supervision/assist for medications management;Direct supervision/assist for financial management;Help with stairs or ramp for entrance;Assist for transportation      Equipment Recommendations  None recommended by PT    Recommendations for Other Services       Precautions / Restrictions Precautions Precautions: Fall Precaution Comments: vision impairment     Mobility  Bed Mobility Overal bed mobility: Modified Independent             General bed mobility comments: HOB elevated, uses bed rail    Transfers Overall transfer level: Needs assistance Equipment used: Rolling walker (2 wheels) Transfers: Sit to/from Stand Sit to Stand: Min guard           General transfer comment: min/guard for safety, cues for hand placement    Ambulation/Gait Ambulation/Gait assistance: Min guard, Min assist Gait Distance (Feet): 8 Feet (x2) Assistive device: Rolling walker (2 wheels) Gait Pattern/deviations: Step-through pattern, Decreased stride length, Shuffle, Trunk flexed Gait  velocity: decr     General Gait Details: assist to guide RW with amb to bathroom and back to bed, pt reports visual impairments, also cues for pushing down on RW due to bil feet pain; pt delinces any further activity because of pain in her feet, SPO2 90% on room air upon ambulating back to bed, 95% with sitting EOB, returned to supine and reapplied O2 [place] (wearing on arrival)   Stairs             Wheelchair Mobility     Tilt Bed    Modified Rankin (Stroke Patients Only)       Balance                                            Cognition Arousal/Alertness: Awake/alert Behavior During Therapy: WFL for tasks assessed/performed Overall Cognitive Status: No family/caregiver present to determine baseline cognitive functioning                                 General Comments: declined participating but then requested assist to use bathroom, also states, "I think I'm dying."  "I just wanted to clean the house out before they send me somewhere."        Exercises      General Comments        Pertinent Vitals/Pain Pain Assessment Pain Assessment: Faces Faces Pain Scale: Hurts little more Pain Location: feet especially left foot (observed calluses) Pain Descriptors / Indicators: Grimacing,  Sore, Tender Pain Intervention(s): Repositioned, Monitored during session    Home Living                          Prior Function            PT Goals (current goals can now be found in the care plan section) Acute Rehab PT Goals PT Goal Formulation: With patient Time For Goal Achievement: 09/17/22 Potential to Achieve Goals: Good Progress towards PT goals: Progressing toward goals    Frequency    Min 1X/week      PT Plan Current plan remains appropriate    Co-evaluation              AM-PAC PT "6 Clicks" Mobility   Outcome Measure  Help needed turning from your back to your side while in a flat bed without using  bedrails?: None Help needed moving from lying on your back to sitting on the side of a flat bed without using bedrails?: None Help needed moving to and from a bed to a chair (including a wheelchair)?: A Little Help needed standing up from a chair using your arms (e.g., wheelchair or bedside chair)?: A Little Help needed to walk in hospital room?: A Little Help needed climbing 3-5 steps with a railing? : A Little 6 Click Score: 20    End of Session Equipment Utilized During Treatment: Gait belt Activity Tolerance: Patient tolerated treatment well Patient left: in bed;with call bell/phone within reach;with bed alarm set   PT Visit Diagnosis: Unsteadiness on feet (R26.81);Difficulty in walking, not elsewhere classified (R26.2);Muscle weakness (generalized) (M62.81)     Time: 1610-9604 PT Time Calculation (min) (ACUTE ONLY): 18 min  Charges:    $Gait Training: 8-22 mins PT General Charges $$ ACUTE PT VISIT: 1 Visit                     Paulino Door, DPT Physical Therapist Acute Rehabilitation Services Office: [phone]    Janan Halter P[place] 09/03/2022, 3:58 PM

## 2022-09-03 NOTE — Progress Notes (Signed)
[facility]                                                                               Amanda Cochran, is a 89 y.o. female, DOB - 26-Jan-1929, WUJ:[id number] Admit date - 08/19/2022    Outpatient Primary MD for the patient is [name], MD  LOS - 13  days    Brief summary   89 y.o. female with medical history significant of open-angle glaucoma of both eyes with legal blindness, CVA with some residual right-sided weakness (uses a cane to ambulate), hyperlipidemia, hypertension, atrial fibrillation on Eliquis, hypothyroidism who was brought to the emergency department after she was found screaming in her house at "intruders."    Patient was felt to be suffering from acute metabolic encephalopathy secondary to possible urinary tract infection.  The hospitalist group was called to assess the patient for admission to the hospital.    Patient was placed on intravenous antibiotics with ceftriaxone.  During this hospitalization, echocardiogram revealed markedly low ejection fraction of 30 to 35% with indeterminate diastolic function and.  Patient did not appear to be in acute heart failure.  Case was discussed with cardiology and home regimen of beta-blocker therapy was titrated.  Doxycycline was added to the ceftriaxone later in the hospitalization due to additional concerns for possible pneumonia.  Over the course of the hospitalization the patient has exhibited bouts of agitation and delusions.  Psychiatry was consulted and felt the patient was likely suffering from residual delirium from her underlying illness and did not believe that the patient was suffering an underlying psychiatric condition. She is seen and examined today, she remains pleasantly confused. Doesn't want to be left alone. Wants the door open, so she can see people come in and out. She said she had some breakfast.     Assessment & Plan    Assessment and Plan:  Acute encephalopathy probably secondary to  paranoid delusions, in the setting of decline in cognitive status with some progressive confusion and dementia.  Please see detailed note from [name] who has spoken to the patient's brother and the [facility] regarding her mental status.  She was seen by psychiatry recommended to start the patient on Depakote. She was lethargic on 7/10, but she is more awake today.  Work up has been negative so far.  Cxr does not show any pneumonia.  MRI brain  shows Moderate to severe chronic small-vessel disease with scattered areas of old infarction throughout the brain. Psychiatry consulted for capacity evaluation and recommendations given.  I do not think she is safe to go home by herself unless she had 24 hour care at home. She is high fall risk and the next fall might be fatal especially with her vision impairment and her being on eliquis.  She would benefit from SNF placement. Called family , unable to reach them, will try again in the morning.     Vision loss in the left eye.  Chronic blindness in the right eye.  Suspect progression of glaucoma as per [name] with Ophthalmology.  Recommended outpatient follow up with [name].    Ecoli UTI Completed the course of antibiotics.  Possible pneumonia Completed 5 day sof doxycycline.    AKI on stage 3a CKD Creatinine back to baseline.   Chronic systolic CHF: LAST EF is 30 to 35% with indeterminate diastolic dysfunction.  Continue with eliquis and BB.    Hypothyroidism:  Resume synthroid.    H/o CVA;  Reviewed the MRI brain without contrast.       Estimated body mass index is 23.33 kg/m as calculated from the following:   Height as of this encounter: 5\' 5"  (1.651 m).   Weight as of this encounter: 63.6 kg.  Code Status: full code.  DVT Prophylaxis:  SCDs Start: 08/20/22 0644 apixaban (ELIQUIS) tablet 5 mg   Level of Care: Level of care: Med-Surg Family Communication: None at bedside.   Disposition Plan:     Remains  inpatient appropriate:  [facility]  Procedures:  MRI BRAIN AND ORBITS.   Consultants:   Psychiatry.  Palliative care Ophthalmology  Antimicrobials:   Anti-infectives (From admission, onward)    Start     Dose/Rate Route Frequency Ordered Stop   08/24/22 2200  amoxicillin-clavulanate (AUGMENTIN) 500-125 MG per tablet 1 tablet        1 tablet Oral 2 times daily 08/24/22 1427 08/26/22 2228   08/24/22 1000  amoxicillin-clavulanate (AUGMENTIN) 875-125 MG per tablet 1 tablet  Status:  Discontinued        1 tablet Oral Every 12 hours 08/23/22 1255 08/24/22 1427   08/23/22 1345  doxycycline (VIBRA-TABS) tablet 100 mg  Status:  Discontinued        100 mg Oral Every 12 hours 08/23/22 1255 08/27/22 1425   08/21/22 0800  cefTRIAXone (ROCEPHIN) 1 g in sodium chloride 0.9 % 100 mL IVPB  Status:  Discontinued        1 g 200 mL/hr over 30 Minutes Intravenous Every 24 hours 08/20/22 0644 08/23/22 1255   08/20/22 0600  cefTRIAXone (ROCEPHIN) 1 g in sodium chloride 0.9 % 100 mL IVPB        1 g 200 mL/hr over 30 Minutes Intravenous  Once 08/20/22 0557 08/20/22 1610        Medications  Scheduled Meds:  apixaban  5 mg Oral BID   benazepril  20 mg Oral Daily   cholecalciferol  2,000 Units Oral Daily   divalproex  125 mg Oral Q8H   dorzolamide-timolol  1 drop Both Eyes BID   feeding supplement  237 mL Oral TID BM   latanoprost  1 drop Both Eyes QHS   levothyroxine  100 mcg Oral Q0600   metoprolol succinate  50 mg Oral Daily   pilocarpine  1 drop Both Eyes QID   thiamine  100 mg Oral Daily   Continuous Infusions: PRN Meds:.acetaminophen **OR** acetaminophen, ondansetron (ZOFRAN) IV    Subjective:   Amanda Cochran was seen and examined today.  Pleasantly confused, not in distress or agitated today.  Objective:   Vitals:   09/02/22 1257 09/02/22 2042 09/03/22 0443 09/03/22 0539  BP: (!) 156/67 (!) 163/86  (!) 140/72  Pulse: 60 60  (!) 52  Resp: 18 14  14   Temp: 97.7 F (36.5 C) (!) 97.5 F  (36.4 C)  97.7 F (36.5 C)  TempSrc: Oral Oral  Oral  SpO2: 100% 99%  100%  Weight:   63.6 kg   Height:       No intake or output data in the 24 hours ending 09/03/22 1310  Filed Weights   09/01/22 0500 09/02/22 0459 09/03/22 0443  Weight:  64.5 kg 64.1 kg 63.6 kg     Exam General exam: elderly frail lady not in distress.  Respiratory system: Clear to auscultation. Respiratory effort normal. Cardiovascular system: S1 & S2 heard, RRR. No JVD,  Gastrointestinal system: Abdomen is nondistended, soft and nontender.  Central nervous system: alert, and answering questions. Has vision impairment.  Extremities: Symmetric 5 x 5 power. Skin: No rashes,     Data Reviewed:  I have personally reviewed following labs and imaging studies   CBC Lab Results  Component Value Date   WBC 7.7 09/02/2022   RBC 4.82 09/02/2022   HGB 14.0 09/02/2022   HCT 45.0 09/02/2022   MCV 93.4 09/02/2022   MCH 29.0 09/02/2022   PLT 167 09/02/2022   MCHC 31.1 09/02/2022   RDW 17.4 (H) 09/02/2022   LYMPHSABS 1.8 09/02/2022   MONOABS 0.5 09/02/2022   EOSABS 0.2 09/02/2022   BASOSABS 0.1 09/02/2022     Last metabolic panel Lab Results  Component Value Date   NA 143 09/02/2022   K 3.6 09/02/2022   CL 106 09/02/2022   CO2 27 09/02/2022   BUN 25 (H) 09/02/2022   CREATININE 1.15 (H) 09/02/2022   GLUCOSE 91 09/02/2022   GFRNONAA 45 (L) 09/02/2022   GFRAA >60 01/14/2019   CALCIUM 8.5 (L) 09/02/2022   PHOS 3.4 08/21/2022   PROT 5.7 (L) 09/02/2022   ALBUMIN 3.0 (L) 09/02/2022   BILITOT 0.9 09/02/2022   ALKPHOS 37 (L) 09/02/2022   AST 23 09/02/2022   ALT 23 09/02/2022   ANIONGAP 10 09/02/2022    CBG (last 3)  No results for input(s): "GLUCAP" in the last 72 hours.    Coagulation Profile: No results for input(s): "INR", "PROTIME" in the last 168 hours.   Radiology Studies: DG CHEST PORT 1 VIEW  Result Date: 09/02/2022 CLINICAL DATA:  Dyspnea EXAM: PORTABLE CHEST 1 VIEW COMPARISON:   08/23/2022 FINDINGS: Cardiac shadow is enlarged but stable. Aortic calcifications are again seen. Lungs are well aerated bilaterally. No focal infiltrate or effusion is seen. No bony abnormality is noted. IMPRESSION: No active disease. Electronically Signed   By: Alcide Clever M.D.   On: 09/02/2022 11:28       Kathlen Mody M.D. [facility] 09/03/2022, 1:10 PM  Available via Epic secure chat 7am-7pm After 7 pm, please refer to night coverage provider listed on amion.

## 2022-09-04 DIAGNOSIS — I5022 Chronic systolic (congestive) heart failure: Secondary | ICD-10-CM | POA: Diagnosis not present

## 2022-09-04 DIAGNOSIS — G9341 Metabolic encephalopathy: Secondary | ICD-10-CM | POA: Diagnosis not present

## 2022-09-04 DIAGNOSIS — J189 Pneumonia, unspecified organism: Secondary | ICD-10-CM | POA: Diagnosis not present

## 2022-09-04 DIAGNOSIS — N390 Urinary tract infection, site not specified: Secondary | ICD-10-CM | POA: Diagnosis not present

## 2022-09-04 MED ORDER — MEGESTROL ACETATE 400 MG/10ML PO SUSP
400.0000 mg | Freq: Every day | ORAL | Status: DC
  Administered 2022-09-04 – 2022-09-16 (×13): 400 mg via ORAL
  Filled 2022-09-04 (×13): qty 10

## 2022-09-04 NOTE — Plan of Care (Signed)
  Problem: Health Behavior/Discharge Planning: Goal: Ability to manage health-related needs will improve Outcome: Progressing   Problem: Clinical Measurements: Goal: Ability to maintain clinical measurements within normal limits will improve Outcome: Progressing Goal: Diagnostic test results will improve Outcome: Progressing   Problem: Activity: Goal: Risk for activity intolerance will decrease 09/04/2022 1646 by Cathlean Cower, RN Outcome: Progressing 09/04/2022 1644 by Cathlean Cower, RN Outcome: Progressing   Problem: Coping: Goal: Level of anxiety will decrease 09/04/2022 1646 by Cathlean Cower, RN Outcome: Progressing 09/04/2022 1644 by Cathlean Cower, RN Outcome: Progressing   Problem: Safety: Goal: Ability to remain free from injury will improve 09/04/2022 1646 by , Stormy Card, RN Outcome: Progressing 09/04/2022 1644 by Cathlean Cower, RN Outcome: Progressing   Problem: Skin Integrity: Goal: Risk for impaired skin integrity will decrease 09/04/2022 1646 by Cathlean Cower, RN Outcome: Progressing 09/04/2022 1644 by Cathlean Cower, RN Outcome: Progressing   Problem: Elimination: Goal: Will not experience complications related to bowel motility 09/04/2022 1646 by Cathlean Cower, RN Outcome: Not Progressing 09/04/2022 1644 by Cathlean Cower, RN Outcome: Not Progressing

## 2022-09-04 NOTE — Plan of Care (Signed)
  Problem: Clinical Measurements: Goal: Ability to maintain clinical measurements within normal limits will improve Outcome: Progressing Goal: Will remain free from infection Outcome: Progressing Goal: Diagnostic test results will improve Outcome: Progressing Goal: Respiratory complications will improve Outcome: Progressing Goal: Cardiovascular complication will be avoided Outcome: Progressing   Problem: Activity: Goal: Risk for activity intolerance will decrease Outcome: Progressing   Problem: Nutrition: Goal: Adequate nutrition will be maintained Outcome: Progressing   Problem: Coping: Goal: Level of anxiety will decrease Outcome: Progressing   Problem: Elimination: Goal: Will not experience complications related to bowel motility Outcome: Progressing Goal: Will not experience complications related to urinary retention Outcome: Progressing   Problem: Safety: Goal: Ability to remain free from injury will improve Outcome: Progressing   Problem: Skin Integrity: Goal: Risk for impaired skin integrity will decrease Outcome: Progressing   

## 2022-09-04 NOTE — Progress Notes (Signed)
[facility]                                                                               Amanda Cochran, is a 89 y.o. female, DOB - Jan 31, 1929, [id number] Admit date - 08/19/2022    Outpatient Primary MD for the patient is [name], MD  LOS - 14  days    Brief summary   89 y.o. female with medical history significant of open-angle glaucoma of both eyes with legal blindness, CVA with some residual right-sided weakness (uses a cane to ambulate), hyperlipidemia, hypertension, atrial fibrillation on Eliquis, hypothyroidism who was brought to the emergency department after she was found screaming in her house at "intruders."    Patient was felt to be suffering from acute metabolic encephalopathy secondary to possible urinary tract infection.  The hospitalist group was called to assess the patient for admission to the hospital.    Patient was placed on intravenous antibiotics with ceftriaxone.  During this hospitalization, echocardiogram revealed markedly low ejection fraction of 30 to 35% with indeterminate diastolic function and.  Patient did not appear to be in acute heart failure.  Case was discussed with cardiology and home regimen of beta-blocker therapy was titrated.  Doxycycline was added to the ceftriaxone later in the hospitalization due to additional concerns for possible pneumonia.  Over the course of the hospitalization the patient has exhibited bouts of agitation and delusions.  Psychiatry was consulted and felt the patient was likely suffering from residual delirium from her underlying illness and did not believe that the patient was suffering an underlying psychiatric condition.  Patient seen and examined today, she is teary and tells me that she cannot do much by herself. She wants to know if she can go home. We talked about safety issues and that she needs 24 hour care. Unfortunately her brother is not a position to help her, her nephew and niece work and unable  to provide 24 hour care. Discussed with the patient and her brother over the phone.  She wanted me to check with her brother to see if that s what is best for her  ( Regarding disposition/ SNF)    Assessment & Plan    Assessment and Plan:  Acute encephalopathy probably secondary to paranoid delusions, in the setting of decline in cognitive status with some progressive confusion and dementia.  Please see detailed note from [name] who has spoken to the patient's brother and the [facility] regarding her mental status.  Patient continues to decline.  She was seen by psychiatry recommended to start the patient on Depakote. Work up has been negative so far.  Cxr does not show any pneumonia.  MRI brain  shows Moderate to severe chronic small-vessel disease with scattered areas of old infarction throughout the brain. Psychiatry consulted for capacity evaluation and recommendations given.  I do not think she is safe to go home by herself unless she had 24 hour care at home. She is high fall risk and the next fall might be fatal especially with her vision impairment and her being on eliquis.  She would benefit from SNF placement.     Vision loss in the  left eye.  Chronic blindness in the right eye.  Suspect progression of glaucoma as per [name] with Ophthalmology.  Recommended outpatient follow up with [name].    Ecoli UTI Completed the course of antibiotics.   Possible pneumonia Completed 5 day sof doxycycline.    AKI on stage 3a CKD Creatinine back to baseline.   Chronic systolic CHF: LAST EF is 30 to 35% with indeterminate diastolic dysfunction.  Continue with eliquis and BB.    Hypothyroidism:  Resume synthroid.    H/o CVA;  Reviewed the MRI brain without contrast.  Legally blind.   Poor Oral intake:  - eating less than 25% of her meals.  -- will add megace to help with her appetite.     Estimated body mass index is 23.33 kg/m as calculated from the  following:   Height as of this encounter: 5\' 5"  (1.651 m).   Weight as of this encounter: 63.6 kg.  Code Status: full code.  DVT Prophylaxis:  SCDs Start: 08/20/22 0644 apixaban (ELIQUIS) tablet 5 mg   Level of Care: Level of care: Med-Surg Family Communication: None at bedside.   Disposition Plan:     Remains inpatient appropriate:  SNF placement.   Procedures:  MRI BRAIN AND ORBITS.   Consultants:   Psychiatry.  Palliative care Ophthalmology  Antimicrobials:   Anti-infectives (From admission, onward)    Start     Dose/Rate Route Frequency Ordered Stop   08/24/22 2200  amoxicillin-clavulanate (AUGMENTIN) 500-125 MG per tablet 1 tablet        1 tablet Oral 2 times daily 08/24/22 1427 08/26/22 2228   08/24/22 1000  amoxicillin-clavulanate (AUGMENTIN) 875-125 MG per tablet 1 tablet  Status:  Discontinued        1 tablet Oral Every 12 hours 08/23/22 1255 08/24/22 1427   08/23/22 1345  doxycycline (VIBRA-TABS) tablet 100 mg  Status:  Discontinued        100 mg Oral Every 12 hours 08/23/22 1255 08/27/22 1425   08/21/22 0800  cefTRIAXone (ROCEPHIN) 1 g in sodium chloride 0.9 % 100 mL IVPB  Status:  Discontinued        1 g 200 mL/hr over 30 Minutes Intravenous Every 24 hours 08/20/22 0644 08/23/22 1255   08/20/22 0600  cefTRIAXone (ROCEPHIN) 1 g in sodium chloride 0.9 % 100 mL IVPB        1 g 200 mL/hr over 30 Minutes Intravenous  Once 08/20/22 0557 08/20/22 4098        Medications  Scheduled Meds:  apixaban  5 mg Oral BID   benazepril  20 mg Oral Daily   cholecalciferol  2,000 Units Oral Daily   divalproex  125 mg Oral Q8H   dorzolamide-timolol  1 drop Both Eyes BID   feeding supplement  237 mL Oral TID BM   latanoprost  1 drop Both Eyes QHS   levothyroxine  100 mcg Oral Q0600   metoprolol succinate  50 mg Oral Daily   pilocarpine  1 drop Both Eyes QID   thiamine  100 mg Oral Daily   Continuous Infusions: PRN Meds:.acetaminophen **OR** acetaminophen, ondansetron  (ZOFRAN) IV    Subjective:   Amanda Cochran was seen and examined today. More alert today and answering questions.   Objective:   Vitals:   09/03/22 2002 09/04/22 0416 09/04/22 1059 09/04/22 1315  BP: (!) 141/91 (!) 135/58 (!) 147/77 (!) 146/69  Pulse: 69 (!) 51 65 (!) 101  Resp: 18 18  18  Temp: 97.8 F (36.6 C) 98.2 F (36.8 C)  98.1 F (36.7 C)  TempSrc: Oral Oral  Oral  SpO2: 99% 99%  96%  Weight:      Height:        Intake/Output Summary (Last 24 hours) at 09/04/2022 1611 Last data filed at 09/04/2022 2440 Gross per 24 hour  Intake 120 ml  Output --  Net 120 ml    Filed Weights   09/01/22 0500 09/02/22 0459 09/03/22 0443  Weight: 64.5 kg 64.1 kg 63.6 kg     Exam General exam: Appears calm and comfortable  Respiratory system: Clear to auscultation. Respiratory effort normal. Cardiovascular system: S1 & S2 heard, RRR. No JVD,  Gastrointestinal system: Abdomen is nondistended, soft and nontender.  Central nervous system: Alert and oriented to place and person. Extremities: no pedal edema.  Skin: No rashes, Psychiatry: flat affect.        Data Reviewed:  I have personally reviewed following labs and imaging studies   CBC Lab Results  Component Value Date   WBC 7.7 09/02/2022   RBC 4.82 09/02/2022   HGB 14.0 09/02/2022   HCT 45.0 09/02/2022   MCV 93.4 09/02/2022   MCH 29.0 09/02/2022   PLT 167 09/02/2022   MCHC 31.1 09/02/2022   RDW 17.4 (H) 09/02/2022   LYMPHSABS 1.8 09/02/2022   MONOABS 0.5 09/02/2022   EOSABS 0.2 09/02/2022   BASOSABS 0.1 09/02/2022     Last metabolic panel Lab Results  Component Value Date   NA 143 09/02/2022   K 3.6 09/02/2022   CL 106 09/02/2022   CO2 27 09/02/2022   BUN 25 (H) 09/02/2022   CREATININE 1.15 (H) 09/02/2022   GLUCOSE 91 09/02/2022   GFRNONAA 45 (L) 09/02/2022   GFRAA >60 01/14/2019   CALCIUM 8.5 (L) 09/02/2022   PHOS 3.4 08/21/2022   PROT 5.7 (L) 09/02/2022   ALBUMIN 3.0 (L) 09/02/2022   BILITOT  0.9 09/02/2022   ALKPHOS 37 (L) 09/02/2022   AST 23 09/02/2022   ALT 23 09/02/2022   ANIONGAP 10 09/02/2022    CBG (last 3)  No results for input(s): "GLUCAP" in the last 72 hours.    Coagulation Profile: No results for input(s): "INR", "PROTIME" in the last 168 hours.   Radiology Studies: No results found.     Kathlen Mody M.D. [facility] 09/04/2022, 4:11 PM  Available via Epic secure chat 7am-7pm After 7 pm, please refer to night coverage provider listed on amion.

## 2022-09-04 NOTE — Progress Notes (Signed)
Mobility Specialist - Progress Note   09/04/22 1105  Mobility  Activity Transferred from bed to chair  Level of Assistance Standby assist, set-up cues, supervision of patient - no hands on  Assistive Device Front wheel walker  Distance Ambulated (ft) 2 ft  Activity Response Tolerated well  Mobility Referral Yes  $Mobility charge 1 Mobility  Mobility Specialist Start Time (ACUTE ONLY) 1054  Mobility Specialist Stop Time (ACUTE ONLY) 1103  Mobility Specialist Time Calculation (min) (ACUTE ONLY) 9 min   Pt received in bed and agreeable to transfer for recliner to meal. No complaints during session. Pt to recliner after session with all needs met. Chair alarm on.   [facility]

## 2022-09-04 NOTE — Progress Notes (Signed)
PMT no charge note. Chart reviewed, patient seen resting in bed does not open eyes does not respond states that she is okay Attempting to participate with mobility specialist. Overall, PMT continues to recommend SNF rehab with palliative upon discharge. No charge Amanda Hawking MD [facility] health palliative

## 2022-09-05 DIAGNOSIS — G9341 Metabolic encephalopathy: Secondary | ICD-10-CM | POA: Diagnosis not present

## 2022-09-05 DIAGNOSIS — I482 Chronic atrial fibrillation, unspecified: Secondary | ICD-10-CM | POA: Diagnosis not present

## 2022-09-05 DIAGNOSIS — Z8673 Personal history of transient ischemic attack (TIA), and cerebral infarction without residual deficits: Secondary | ICD-10-CM | POA: Diagnosis not present

## 2022-09-05 DIAGNOSIS — I10 Essential (primary) hypertension: Secondary | ICD-10-CM | POA: Diagnosis not present

## 2022-09-05 LAB — CBC WITH DIFFERENTIAL/PLATELET
Abs Immature Granulocytes: 0.03 10*3/uL (ref 0.00–0.07)
Basophils Absolute: 0.1 10*3/uL (ref 0.0–0.1)
Basophils Relative: 2 %
Eosinophils Absolute: 0.2 10*3/uL (ref 0.0–0.5)
Eosinophils Relative: 2 %
HCT: 44.6 % (ref 36.0–46.0)
Hemoglobin: 13.7 g/dL (ref 12.0–15.0)
Immature Granulocytes: 0 %
Lymphocytes Relative: 28 %
Lymphs Abs: 2.2 10*3/uL (ref 0.7–4.0)
MCH: 28.9 pg (ref 26.0–34.0)
MCHC: 30.7 g/dL (ref 30.0–36.0)
MCV: 94.1 fL (ref 80.0–100.0)
Monocytes Absolute: 0.5 10*3/uL (ref 0.1–1.0)
Monocytes Relative: 6 %
Neutro Abs: 4.9 10*3/uL (ref 1.7–7.7)
Neutrophils Relative %: 62 %
Platelets: 158 10*3/uL (ref 150–400)
RBC: 4.74 MIL/uL (ref 3.87–5.11)
RDW: 17.2 % — ABNORMAL HIGH (ref 11.5–15.5)
WBC: 7.9 10*3/uL (ref 4.0–10.5)
nRBC: 0 % (ref 0.0–0.2)

## 2022-09-05 LAB — BASIC METABOLIC PANEL
Anion gap: 9 (ref 5–15)
BUN: 26 mg/dL — ABNORMAL HIGH (ref 8–23)
CO2: 28 mmol/L (ref 22–32)
Calcium: 8.4 mg/dL — ABNORMAL LOW (ref 8.9–10.3)
Chloride: 105 mmol/L (ref 98–111)
Creatinine, Ser: 0.97 mg/dL (ref 0.44–1.00)
GFR, Estimated: 55 mL/min — ABNORMAL LOW (ref >60)
Glucose, Bld: 91 mg/dL (ref 70–99)
Potassium: 3.4 mmol/L — ABNORMAL LOW (ref 3.5–5.1)
Sodium: 142 mmol/L (ref 135–145)

## 2022-09-05 MED ORDER — POTASSIUM CHLORIDE CRYS ER 20 MEQ PO TBCR
40.0000 meq | EXTENDED_RELEASE_TABLET | Freq: Once | ORAL | Status: AC
  Administered 2022-09-05: 40 meq via ORAL
  Filled 2022-09-05: qty 2

## 2022-09-05 NOTE — Progress Notes (Signed)
[facility]                                                                               Amanda Cochran, is a 89 y.o. female, DOB - 1928/06/25, [name]:[id number] Admit date - 08/19/2022    Outpatient Primary MD for the patient is [name], MD  LOS - 15  days    Brief summary   89 y.o. female with medical history significant of open-angle glaucoma of both eyes with legal blindness, CVA with some residual right-sided weakness (uses a cane to ambulate), hyperlipidemia, hypertension, atrial fibrillation on Eliquis, hypothyroidism who was brought to the emergency department after she was found screaming in her house at "intruders."    Patient was felt to be suffering from acute metabolic encephalopathy secondary to possible urinary tract infection.  The hospitalist group was called to assess the patient for admission to the hospital.    Patient was placed on intravenous antibiotics with ceftriaxone.  During this hospitalization, echocardiogram revealed markedly low ejection fraction of 30 to 35% with indeterminate diastolic function and.  Patient did not appear to be in acute heart failure.  Case was discussed with cardiology and home regimen of beta-blocker therapy was titrated.  Doxycycline was added to the ceftriaxone later in the hospitalization due to additional concerns for possible pneumonia.  Over the course of the hospitalization the patient has exhibited bouts of agitation and delusions.  Psychiatry was consulted and felt the patient was likely suffering from residual delirium from her underlying illness and did not believe that the patient was suffering an underlying psychiatric condition.  Patient seen and examined today, she is teary and tells me that she cannot do much by herself. She wants to know if she can go home. We talked about safety issues and that she needs 24 hour care. Unfortunately her brother is not a position to help her, her nephew and niece work and unable  to provide 24 hour care. Discussed with the patient and her brother over the phone.  She wanted me to check with her brother to see if that s what is best for her  ( Regarding disposition/ SNF). Discussed with the brother over the phone, and agreed for SNF placement.    Assessment & Plan    Assessment and Plan:  Acute encephalopathy probably secondary to paranoid delusions, in the setting of decline in cognitive status with some progressive confusion and dementia.  Please see detailed note from [name] who has spoken to the patient's brother and the [facility] regarding her mental status.  Patient continues to decline.  She was seen by psychiatry recommended to start the patient on Depakote. Work up has been negative so far for infection.  Cxr does not show any pneumonia.  MRI brain  shows Moderate to severe chronic small-vessel disease with scattered areas of old infarction throughout the brain. Psychiatry consulted for capacity evaluation and recommendations given.  I do not think she is safe to go home by herself unless she had 24 hour care at home. She is high fall risk and the next fall might be fatal especially with her vision impairment and her being on eliquis.  She would benefit from SNF placement.  No new complaints today.  She remains deconditioned and debilitated.     Vision loss in the left eye.  Chronic blindness in the right eye.  Suspect progression of glaucoma as per [name] with Ophthalmology.  Recommended outpatient follow up with [name].    Ecoli UTI Completed the course of antibiotics.   Possible pneumonia Completed 5 day sof doxycycline.    AKI on stage 3a CKD Creatinine back to baseline.   Chronic systolic CHF: LAST EF is 30 to 35% with indeterminate diastolic dysfunction.  Continue with eliquis and BB.  She is on 2 lit of [place] oxygen, not sob this morning.    Hypothyroidism:  Resume synthroid.    H/o CVA;  Reviewed the MRI brain without  contrast.  Legally blind.   Poor Oral intake:  - eating less than 25% of her meals.  -- will add megace to help with her appetite.     Estimated body mass index is 21.72 kg/m as calculated from the following:   Height as of this encounter: 5\' 5"  (1.651 m).   Weight as of this encounter: 59.2 kg.  Code Status: full code.  DVT Prophylaxis:  SCDs Start: 08/20/22 0644 apixaban (ELIQUIS) tablet 5 mg   Level of Care: Level of care: Med-Surg Family Communication: None at bedside.   Disposition Plan:     Remains inpatient appropriate:  SNF placement.   Procedures:  MRI BRAIN AND ORBITS.   Consultants:   Psychiatry.  Palliative care Ophthalmology  Antimicrobials:   Anti-infectives (From admission, onward)    Start     Dose/Rate Route Frequency Ordered Stop   08/24/22 2200  amoxicillin-clavulanate (AUGMENTIN) 500-125 MG per tablet 1 tablet        1 tablet Oral 2 times daily 08/24/22 1427 08/26/22 2228   08/24/22 1000  amoxicillin-clavulanate (AUGMENTIN) 875-125 MG per tablet 1 tablet  Status:  Discontinued        1 tablet Oral Every 12 hours 08/23/22 1255 08/24/22 1427   08/23/22 1345  doxycycline (VIBRA-TABS) tablet 100 mg  Status:  Discontinued        100 mg Oral Every 12 hours 08/23/22 1255 08/27/22 1425   08/21/22 0800  cefTRIAXone (ROCEPHIN) 1 g in sodium chloride 0.9 % 100 mL IVPB  Status:  Discontinued        1 g 200 mL/hr over 30 Minutes Intravenous Every 24 hours 08/20/22 0644 08/23/22 1255   08/20/22 0600  cefTRIAXone (ROCEPHIN) 1 g in sodium chloride 0.9 % 100 mL IVPB        1 g 200 mL/hr over 30 Minutes Intravenous  Once 08/20/22 0557 08/20/22 1610        Medications  Scheduled Meds:  apixaban  5 mg Oral BID   benazepril  20 mg Oral Daily   cholecalciferol  2,000 Units Oral Daily   divalproex  125 mg Oral Q8H   dorzolamide-timolol  1 drop Both Eyes BID   feeding supplement  237 mL Oral TID BM   latanoprost  1 drop Both Eyes QHS   levothyroxine  100 mcg  Oral Q0600   megestrol  400 mg Oral Daily   metoprolol succinate  50 mg Oral Daily   pilocarpine  1 drop Both Eyes QID   thiamine  100 mg Oral Daily   Continuous Infusions: PRN Meds:.acetaminophen **OR** acetaminophen, ondansetron (ZOFRAN) IV    Subjective:   Tonja Marie was seen and examined today.  Is agreeable to eating breakfast. No chest pain or sob. Reports that she is not able to see . That she is dependent on others for everything.    Objective:   Vitals:   09/04/22 1315 09/04/22 2040 09/05/22 0500 09/05/22 0526  BP: (!) 146/69 139/72  (!) 158/73  Pulse: (!) 101 67  64  Resp: 18 20  18   Temp: 98.1 F (36.7 C) 97.9 F (36.6 C)  (!) 97.4 F (36.3 C)  TempSrc: Oral Oral  Oral  SpO2: 96% 96%  93%  Weight:   59.2 kg   Height:        Intake/Output Summary (Last 24 hours) at 09/05/2022 1250 Last data filed at 09/05/2022 1054 Gross per 24 hour  Intake 360 ml  Output --  Net 360 ml    Filed Weights   09/02/22 0459 09/03/22 0443 09/05/22 0500  Weight: 64.1 kg 63.6 kg 59.2 kg     Exam General exam: Appears calm and comfortable  Respiratory system: Clear to auscultation. Respiratory effort normal. Cardiovascular system: S1 & S2 heard, RRR. No JVD,  Gastrointestinal system: Abdomen is nondistended, soft and nontender.  Central nervous system: Alert and oriented to person only.  Extremities: no cyanosis.  Skin: No rashes,  Psychiatry: calm,without any agitation,.        Data Reviewed:  I have personally reviewed following labs and imaging studies   CBC Lab Results  Component Value Date   WBC 7.9 09/05/2022   RBC 4.74 09/05/2022   HGB 13.7 09/05/2022   HCT 44.6 09/05/2022   MCV 94.1 09/05/2022   MCH 28.9 09/05/2022   PLT 158 09/05/2022   MCHC 30.7 09/05/2022   RDW 17.2 (H) 09/05/2022   LYMPHSABS 2.2 09/05/2022   MONOABS 0.5 09/05/2022   EOSABS 0.2 09/05/2022   BASOSABS 0.1 09/05/2022     Last metabolic panel Lab Results  Component Value Date    NA 142 09/05/2022   K 3.4 (L) 09/05/2022   CL 105 09/05/2022   CO2 28 09/05/2022   BUN 26 (H) 09/05/2022   CREATININE 0.97 09/05/2022   GLUCOSE 91 09/05/2022   GFRNONAA 55 (L) 09/05/2022   GFRAA >60 01/14/2019   CALCIUM 8.4 (L) 09/05/2022   PHOS 3.4 08/21/2022   PROT 5.7 (L) 09/02/2022   ALBUMIN 3.0 (L) 09/02/2022   BILITOT 0.9 09/02/2022   ALKPHOS 37 (L) 09/02/2022   AST 23 09/02/2022   ALT 23 09/02/2022   ANIONGAP 9 09/05/2022    CBG (last 3)  No results for input(s): "GLUCAP" in the last 72 hours.    Coagulation Profile: No results for input(s): "INR", "PROTIME" in the last 168 hours.   Radiology Studies: No results found.     Kathlen Mody M.D. [facility] 09/05/2022, 12:50 PM  Available via Epic secure chat 7am-7pm After 7 pm, please refer to night coverage provider listed on amion.

## 2022-09-05 NOTE — Progress Notes (Signed)
PMT no charge note  Chart reviewed, patient seen resting in bed, discussed with [facility] MD colleague who has discussed with patient's brother. Patient to be discharged to SNF rehab towards the end of this hospitalization, recommend addition of palliative services.  No charge Amanda Hawking MD [place] palliative.

## 2022-09-05 NOTE — Progress Notes (Signed)
Occupational Therapy Treatment Patient Details Name: Amanda Cochran MRN: [id number] DOB: 23-Dec-1928 Today's Date: 09/05/2022   History of present illness Pt is a 89yo female presenting to [facility] ED on 6/27 secondary AME with associated UTI, CT head negative for acute findings.   PMH: depression, hx of CVA (L basal ganglia infarct with R hemiparesis), Hld, HTN, hx of left eye lacunar stroke,   OT comments  Pt progressing towards goals this session, needing min guard-mod A for ADLs, min A for bed mobility and min guard for transfer with RW. Pt able to compete BUE seated therex, overall limited by low vision, adapted pt's call bell with tape for easier location of call button, pt able to demo use. Pt presenting with impairments listed below, will follow acutely. Continue to recommend HHOT given pt has 24/7 assist at d/c.    Recommendations for follow up therapy are one component of a multi-disciplinary discharge planning process, led by the attending physician.  Recommendations may be updated based on patient status, additional functional criteria and insurance authorization.    Assistance Recommended at Discharge Frequent or constant Supervision/Assistance  Patient can return home with the following  A little help with walking and/or transfers;A little help with bathing/dressing/bathroom;Assist for transportation;Assistance with cooking/housework;Direct supervision/assist for financial management;Direct supervision/assist for medications management   Equipment Recommendations  Other (comment) (defer)    Recommendations for Other Services      Precautions / Restrictions Precautions Precautions: Fall Precaution Comments: vision impairment, HOH Restrictions Weight Bearing Restrictions: No       Mobility Bed Mobility Overal bed mobility: Needs Assistance Bed Mobility: Supine to Sit, Sit to Supine     Supine to sit: Min assist Sit to supine: Min assist   General bed mobility comments:  assist for trunk elevation and to return BLE's into bed    Transfers Overall transfer level: Needs assistance Equipment used: Rolling walker (2 wheels) Transfers: Sit to/from Stand Sit to Stand: Min guard                 Balance Overall balance assessment: Needs assistance Sitting-balance support: Feet supported Sitting balance-Leahy Scale: Good     Standing balance support: During functional activity, Reliant on assistive device for balance Standing balance-Leahy Scale: Poor Standing balance comment: reliant on RW support                           ADL either performed or assessed with clinical judgement   ADL Overall ADL's : Needs assistance/impaired     Grooming: Wash/dry hands;Standing       Lower Body Bathing: Min guard Lower Body Bathing Details (indicate cue type and reason): reaches down to pull up socks Upper Body Dressing : Sitting;Moderate assistance Upper Body Dressing Details (indicate cue type and reason): to don gown     Toilet Transfer: Minimal assistance;Ambulation;Regular Toilet;Rolling walker (2 wheels)   Toileting- Clothing Manipulation and Hygiene: Supervision/safety       Functional mobility during ADLs: Min guard      Extremity/Trunk Assessment Upper Extremity Assessment Upper Extremity Assessment: Generalized weakness   Lower Extremity Assessment Lower Extremity Assessment: Defer to PT evaluation        Vision   Additional Comments: impaired, needs cues to locate items at sink   Perception Perception Perception: Not tested   Praxis Praxis Praxis: Not tested    Cognition Arousal/Alertness: Awake/alert Behavior During Therapy: [facility] for tasks assessed/performed Overall Cognitive Status: No family/caregiver present to  determine baseline cognitive functioning Area of Impairment: Orientation, Awareness, Safety/judgement, Following commands, Problem solving, Attention, Memory                 Orientation Level:  Disoriented to, Time (states she "hasnt' kept track") Current Attention Level: Selective Memory: Decreased short-term memory Following Commands: Follows one step commands inconsistently Safety/Judgement: Decreased awareness of deficits Awareness: Emergent Problem Solving: Requires verbal cues          Exercises Exercises: Other exercises Other Exercises Other Exercises: seated marching Other Exercises: seated leg kicks Other Exercises: seated functional forward reach    Shoulder Instructions       General Comments VSS On RA    Pertinent Vitals/ Pain       Pain Assessment Pain Assessment: Faces Pain Score: 3  Faces Pain Scale: Hurts a little bit Pain Location: bil feet Pain Descriptors / Indicators: Grimacing, Sore, Tender Pain Intervention(s): Limited activity within patient's tolerance, Monitored during session, Repositioned  Home Living                                          Prior Functioning/Environment              Frequency  Min 1X/week        Progress Toward Goals  OT Goals(current goals can now be found in the care plan section)  Progress towards OT goals: Progressing toward goals  Acute Rehab OT Goals Patient Stated Goal: none stated OT Goal Formulation: With patient Time For Goal Achievement: 09/19/22 Potential to Achieve Goals: Good ADL Goals Pt Will Perform Upper Body Bathing: sitting;with modified independence Pt Will Perform Lower Body Bathing: with modified independence;sitting/lateral leans;sit to/from stand Pt Will Perform Lower Body Dressing: with modified independence;sitting/lateral leans;sit to/from stand Pt Will Transfer to Toilet: with modified independence;ambulating Additional ADL Goal #1: Pt will demonstrate improved mentation by scoring <4/10 on short blessed test and answering 4/4 safety questions from the [facility] correctly:   1. What do you do for yourself if you are sick with a cold?    2. What do you do if  you burn yourself and the wound becomes infected?    3. What do you do if you experience severe chest pain and shortness of breath?   4. What number do you call in an emergency?  Plan Discharge plan remains appropriate    Co-evaluation                 AM-PAC OT "6 Clicks" Daily Activity     Outcome Measure   Help from another person eating meals?: A Little Help from another person taking care of personal grooming?: A Little Help from another person toileting, which includes using toliet, bedpan, or urinal?: A Little Help from another person bathing (including washing, rinsing, drying)?: A Lot Help from another person to put on and taking off regular upper body clothing?: A Lot Help from another person to put on and taking off regular lower body clothing?: A Little 6 Click Score: 16    End of Session Equipment Utilized During Treatment: Gait belt;Rolling walker (2 wheels)  OT Visit Diagnosis: Other symptoms and signs involving cognitive function;Low vision, both eyes (H54.2)   Activity Tolerance Patient tolerated treatment well   Patient Left in bed;with call bell/phone within reach;with bed alarm set   Nurse Communication Mobility status  Time: 4098-1191 OT Time Calculation (min): 25 min  Charges: OT General Charges $OT Visit: 1 Visit OT Treatments $Self Care/Home Management : 23-37 mins  Carver Fila, OTD, OTR/L SecureChat Preferred Acute Rehab [phone]   Dalphine Handing 09/05/2022, 4:39 PM

## 2022-09-05 NOTE — TOC Progression Note (Signed)
Transition of Care [facility]) - Progression Note    Patient Details  Name: Amanda Cochran MRN: [id number] Date of Birth: [birth year]  Transition of Care [facility]) CM/SW Contact  Carmina Miller, LCSWA Phone Number: 09/05/2022, 1:25 PM  Clinical Narrative:     CSW attempted to speak with pt's brother Channing Mutters for SNF bed choice, Channing Mutters states he doesn't know anything about SNF and would not make a choice for pt, he states he is disabled himself. CSW attempted three times to explain that a choice needed to be made in order for pt to dc, Channing Mutters continued to state he didn't know what CSW was talking about and wouldn't make a decision. CSW asked if there was anyone else available to make the decision, Channing Mutters states he is the only next of kin left. An APS report may need to be made as there has to be someone to make the decision and sign pt in. Per MD pt is confused. TOC will continue to follow.   Expected Discharge Plan: Home/Self Care Barriers to Discharge: ED Unsafe disposition  Expected Discharge Plan and Services   Discharge Planning Services: CM Consult   Living arrangements for the past 2 months: Single Family Home Expected Discharge Date: 08/29/22                                     Social Determinants of Health (SDOH) Interventions SDOH Screenings   Food Insecurity: No Food Insecurity (08/21/2022)  Housing: Low Risk  (08/21/2022)  Transportation Needs: No Transportation Needs (08/21/2022)  Recent Concern: Transportation Needs - Unmet Transportation Needs (08/20/2022)  Utilities: Not At Risk (08/21/2022)  Tobacco Use: Low Risk  (08/20/2022)    Readmission Risk Interventions     No data to display

## 2022-09-06 DIAGNOSIS — I10 Essential (primary) hypertension: Secondary | ICD-10-CM | POA: Diagnosis not present

## 2022-09-06 DIAGNOSIS — I482 Chronic atrial fibrillation, unspecified: Secondary | ICD-10-CM | POA: Diagnosis not present

## 2022-09-06 DIAGNOSIS — Z8673 Personal history of transient ischemic attack (TIA), and cerebral infarction without residual deficits: Secondary | ICD-10-CM | POA: Diagnosis not present

## 2022-09-06 DIAGNOSIS — G9341 Metabolic encephalopathy: Secondary | ICD-10-CM | POA: Diagnosis not present

## 2022-09-06 NOTE — Progress Notes (Signed)
[facility]                                                                               Amanda Cochran, is a 89 y.o. female, DOB - Jul 14, 1928, ZOX:[id number] Admit date - 08/19/2022    Outpatient Primary MD for the patient is [name], MD  LOS - 16  days    Brief summary   89 y.o. female with medical history significant of open-angle glaucoma of both eyes with legal blindness, CVA with some residual right-sided weakness (uses a cane to ambulate), hyperlipidemia, hypertension, atrial fibrillation on Eliquis, hypothyroidism who was brought to the emergency department after she was found screaming in her house at "intruders."    Patient was felt to be suffering from acute metabolic encephalopathy secondary to possible urinary tract infection.  The hospitalist group was called to assess the patient for admission to the hospital.    Patient was placed on intravenous antibiotics with ceftriaxone.  During this hospitalization, echocardiogram revealed markedly low ejection fraction of 30 to 35% with indeterminate diastolic function and.  Patient did not appear to be in acute heart failure.  Case was discussed with cardiology and home regimen of beta-blocker therapy was titrated.  Doxycycline was added to the ceftriaxone later in the hospitalization due to additional concerns for possible pneumonia.  Over the course of the hospitalization the patient has exhibited bouts of agitation and delusions.  Psychiatry was consulted and felt the patient was likely suffering from residual delirium from her underlying illness and did not believe that the patient was suffering an underlying psychiatric condition.  Patient seen and examined today, she is teary and tells me that she cannot do much by herself. She wants to know if she can go home. We talked about safety issues, in view of her vision impairment and that she needs 24 hour care. Unfortunately her brother is not a position to help her,  her nephew and niece work and unable to provide 24 hour care. Discussed safe disposition with her brother over the phone.  She wanted me to check with her brother to see if that s what is best for her  ( Regarding disposition/ SNF).    Assessment & Plan    Assessment and Plan:  Acute encephalopathy probably secondary to paranoid delusions, in the setting of decline in cognitive status with some progressive confusion and dementia.  Please see detailed note from [name] who has spoken to the patient's brother and the [facility] regarding her mental status.  She was seen by psychiatry recommended to start the patient on Depakote. Work up has been negative so far for infection.  MRI brain  shows Moderate to severe chronic small-vessel disease with scattered areas of old infarction throughout the brain. Psychiatry consulted for capacity evaluation and recommendations given.  I do not think she is safe to go home by herself unless she had 24 hour care at home. She is high fall risk and the next fall might be fatal especially with her vision impairment and her being on eliquis.  She would benefit from SNF placement.  She remains deconditioned and debilitated.     Vision loss in  the left eye.  Chronic blindness in the right eye.  Suspect progression of glaucoma as per [name] with Ophthalmology.  Recommended outpatient follow up with [name].    E coli UTI and possible atypical pneumonia:  Completed the course of antibiotics.   AKI on stage 3a CKD Creatinine back to baseline.   Chronic systolic CHF: LAST EF is 30 to 35% with indeterminate diastolic dysfunction.  Continue with eliquis, benazepril 20 mg daily and metoprolol 50 mg daily.  She is on 2 lit of [place] oxygen, not sob this morning.    Hypokalemia:  Replaced. Repeat level wnl.    Hypothyroidism:  Resume synthroid 100 mcg daily.    H/o CVA;  Reviewed the MRI brain without contrast.  Legally blind.   Poor Oral intake:   - eating less than 25% of her meals.  -- will add megace to help with her appetite.     Estimated body mass index is 21.72 kg/m as calculated from the following:   Height as of this encounter: 5\' 5"  (1.651 m).   Weight as of this encounter: 59.2 kg.  Code Status: full code.  DVT Prophylaxis:  SCDs Start: 08/20/22 0644 apixaban (ELIQUIS) tablet 5 mg   Level of Care: Level of care: Med-Surg Family Communication: None at bedside.   Disposition Plan:     Remains inpatient appropriate:  SNF placement. Patient is medically stable for discharge.  Unable to find safe disposition for the patient.   Procedures:  MRI BRAIN AND ORBITS.   Consultants:   Psychiatry.  Palliative care Ophthalmology  Antimicrobials:   Anti-infectives (From admission, onward)    Start     Dose/Rate Route Frequency Ordered Stop   08/24/22 2200  amoxicillin-clavulanate (AUGMENTIN) 500-125 MG per tablet 1 tablet        1 tablet Oral 2 times daily 08/24/22 1427 08/26/22 2228   08/24/22 1000  amoxicillin-clavulanate (AUGMENTIN) 875-125 MG per tablet 1 tablet  Status:  Discontinued        1 tablet Oral Every 12 hours 08/23/22 1255 08/24/22 1427   08/23/22 1345  doxycycline (VIBRA-TABS) tablet 100 mg  Status:  Discontinued        100 mg Oral Every 12 hours 08/23/22 1255 08/27/22 1425   08/21/22 0800  cefTRIAXone (ROCEPHIN) 1 g in sodium chloride 0.9 % 100 mL IVPB  Status:  Discontinued        1 g 200 mL/hr over 30 Minutes Intravenous Every 24 hours 08/20/22 0644 08/23/22 1255   08/20/22 0600  cefTRIAXone (ROCEPHIN) 1 g in sodium chloride 0.9 % 100 mL IVPB        1 g 200 mL/hr over 30 Minutes Intravenous  Once 08/20/22 0557 08/20/22 0808        Medications  Scheduled Meds:  apixaban  5 mg Oral BID   benazepril  20 mg Oral Daily   cholecalciferol  2,000 Units Oral Daily   divalproex  125 mg Oral Q8H   dorzolamide-timolol  1 drop Both Eyes BID   feeding supplement  237 mL Oral TID BM   latanoprost  1  drop Both Eyes QHS   levothyroxine  100 mcg Oral Q0600   megestrol  400 mg Oral Daily   metoprolol succinate  50 mg Oral Daily   pilocarpine  1 drop Both Eyes QID   thiamine  100 mg Oral Daily   Continuous Infusions: PRN Meds:.acetaminophen **OR** acetaminophen, ondansetron (ZOFRAN) IV    Subjective:   Amanda Cochran  was seen and examined today. No new complaints.   Objective:   Vitals:   09/05/22 0526 09/05/22 1346 09/05/22 2035 09/06/22 0622  BP: (!) 158/73 (!) 148/81 (!) 147/89 (!) 154/83  Pulse: 64 62 64 63  Resp: 18 18 16 20   Temp: (!) 97.4 F (36.3 C) 97.8 F (36.6 C) 97.7 F (36.5 C) (!) 97.5 F (36.4 C)  TempSrc: Oral Oral Oral Oral  SpO2: 93% 97% 97% 93%  Weight:      Height:        Intake/Output Summary (Last 24 hours) at 09/06/2022 1227 Last data filed at 09/06/2022 0933 Gross per 24 hour  Intake 120 ml  Output --  Net 120 ml    Filed Weights   09/02/22 0459 09/03/22 0443 09/05/22 0500  Weight: 64.1 kg 63.6 kg 59.2 kg     Exam General exam: Ill appearing frail elderly lady not in distress.  Respiratory system: air entry fair, on 2 lit of [place] oxygen.  Cardiovascular system: S1 & S2 heard, RRR. No JVD,  Gastrointestinal system: Abdomen is nondistended, soft and nontender.  Central nervous system: Alert and oriented to person and place only.  Extremities: no cyanosis.  Skin: No rashes,  Psychiatry: flat affect.        Data Reviewed:  I have personally reviewed following labs and imaging studies   CBC Lab Results  Component Value Date   WBC 7.9 09/05/2022   RBC 4.74 09/05/2022   HGB 13.7 09/05/2022   HCT 44.6 09/05/2022   MCV 94.1 09/05/2022   MCH 28.9 09/05/2022   PLT 158 09/05/2022   MCHC 30.7 09/05/2022   RDW 17.2 (H) 09/05/2022   LYMPHSABS 2.2 09/05/2022   MONOABS 0.5 09/05/2022   EOSABS 0.2 09/05/2022   BASOSABS 0.1 09/05/2022     Last metabolic panel Lab Results  Component Value Date   NA 142 09/05/2022   K 3.4 (L)  09/05/2022   CL 105 09/05/2022   CO2 28 09/05/2022   BUN 26 (H) 09/05/2022   CREATININE 0.97 09/05/2022   GLUCOSE 91 09/05/2022   GFRNONAA 55 (L) 09/05/2022   GFRAA >60 01/14/2019   CALCIUM 8.4 (L) 09/05/2022   PHOS 3.4 08/21/2022   PROT 5.7 (L) 09/02/2022   ALBUMIN 3.0 (L) 09/02/2022   BILITOT 0.9 09/02/2022   ALKPHOS 37 (L) 09/02/2022   AST 23 09/02/2022   ALT 23 09/02/2022   ANIONGAP 9 09/05/2022    CBG (last 3)  No results for input(s): "GLUCAP" in the last 72 hours.    Coagulation Profile: No results for input(s): "INR", "PROTIME" in the last 168 hours.   Radiology Studies: No results found.     Kathlen Mody M.D. [facility] 09/06/2022, 12:27 PM  Available via Epic secure chat 7am-7pm After 7 pm, please refer to night coverage provider listed on amion.

## 2022-09-07 DIAGNOSIS — I10 Essential (primary) hypertension: Secondary | ICD-10-CM | POA: Diagnosis not present

## 2022-09-07 DIAGNOSIS — G9341 Metabolic encephalopathy: Secondary | ICD-10-CM | POA: Diagnosis not present

## 2022-09-07 DIAGNOSIS — Z8673 Personal history of transient ischemic attack (TIA), and cerebral infarction without residual deficits: Secondary | ICD-10-CM | POA: Diagnosis not present

## 2022-09-07 DIAGNOSIS — I482 Chronic atrial fibrillation, unspecified: Secondary | ICD-10-CM | POA: Diagnosis not present

## 2022-09-07 LAB — BASIC METABOLIC PANEL
Anion gap: 10 (ref 5–15)
BUN: 21 mg/dL (ref 8–23)
CO2: 22 mmol/L (ref 22–32)
Calcium: 8.3 mg/dL — ABNORMAL LOW (ref 8.9–10.3)
Chloride: 108 mmol/L (ref 98–111)
Creatinine, Ser: 1.05 mg/dL — ABNORMAL HIGH (ref 0.44–1.00)
GFR, Estimated: 50 mL/min — ABNORMAL LOW (ref >60)
Glucose, Bld: 75 mg/dL (ref 70–99)
Potassium: 3.7 mmol/L (ref 3.5–5.1)
Sodium: 140 mmol/L (ref 135–145)

## 2022-09-07 NOTE — Plan of Care (Signed)
  Problem: Clinical Measurements: Goal: Ability to maintain clinical measurements within normal limits will improve Outcome: Progressing Goal: Will remain free from infection Outcome: Progressing Goal: Diagnostic test results will improve Outcome: Progressing Goal: Respiratory complications will improve Outcome: Progressing Goal: Cardiovascular complication will be avoided Outcome: Progressing   Problem: Activity: Goal: Risk for activity intolerance will decrease Outcome: Progressing   Problem: Elimination: Goal: Will not experience complications related to bowel motility Outcome: Progressing Goal: Will not experience complications related to urinary retention Outcome: Progressing   Problem: Safety: Goal: Ability to remain free from injury will improve Outcome: Progressing   Problem: Skin Integrity: Goal: Risk for impaired skin integrity will decrease Outcome: Progressing   

## 2022-09-07 NOTE — Progress Notes (Signed)
Mobility Specialist - Progress Note   09/07/22 1039  Mobility  Activity Moved into chair position in bed  Activity Response Tolerated well  Mobility Referral Yes  $Mobility charge 1 Mobility  Mobility Specialist Start Time (ACUTE ONLY) 1029  Mobility Specialist Stop Time (ACUTE ONLY) 1037  Mobility Specialist Time Calculation (min) (ACUTE ONLY) 8 min   Pt received in bed declining mobility. Pt placed in seated position in bed. No complaints during session. Pt to bed after session with all needs met & call bell in reach.    [facility]

## 2022-09-07 NOTE — Progress Notes (Signed)
[facility]                                                                               Amanda Cochran, is a 89 y.o. female, DOB - 1928-11-21, NWG:[id number] Admit date - 08/19/2022    Outpatient Primary MD for the patient is Amanda Ran, MD  LOS - 17  days    Brief summary   89 y.o. female with medical history significant of open-angle glaucoma of both eyes with legal blindness, CVA with some residual right-sided weakness (uses a cane to ambulate), hyperlipidemia, hypertension, atrial fibrillation on Eliquis, hypothyroidism who was brought to the emergency department after she was found screaming in her house at "intruders."    Patient was felt to be suffering from acute metabolic encephalopathy secondary to possible urinary tract infection.  The hospitalist group was called to assess the patient for admission to the hospital.    Patient was placed on intravenous antibiotics with ceftriaxone.  During this hospitalization, echocardiogram revealed markedly low ejection fraction of 30 to 35% with indeterminate diastolic function and.  Patient did not appear to be in acute heart failure.  Case was discussed with cardiology and home regimen of beta-blocker therapy was titrated.  Doxycycline was added to the ceftriaxone later in the hospitalization due to additional concerns for possible pneumonia.  Over the course of the hospitalization the patient has exhibited bouts of agitation and delusions.  Psychiatry was consulted and felt the patient was likely suffering from residual delirium from her underlying illness and did not believe that the patient was suffering an underlying psychiatric condition.  Patient seen and examined today, she is teary and tells me that she cannot do much by herself. She wants to know if she can go home. We talked about safety issues, in view of her vision impairment and that she needs 24 hour care. Unfortunately her brother is not a position to help her,  her nephew and niece work and unable to provide 24 hour care. Discussed safe disposition with her brother over the phone.  She wanted me to check with her brother to see if that s what is best for her  ( Regarding disposition/ SNF).    Assessment & Plan    Assessment and Plan:  Acute encephalopathy probably secondary to paranoid delusions, in the setting of decline in cognitive status with some progressive confusion and dementia.  Please see detailed note from Dr Amanda Cochran who has spoken to the patient's brother and the [facility] regarding her mental status.  She was seen by psychiatry recommended to start the patient on Depakote. Work up has been negative so far for infection.  MRI brain  shows Moderate to severe chronic small-vessel disease with scattered areas of old infarction throughout the brain. Psychiatry consulted for capacity evaluation and recommendations given.  I do not think she is safe to go home by herself unless she had 24 hour care at home. She is high fall risk and the next fall might be fatal especially with her vision impairment and her being on eliquis.  She would benefit from SNF placement. Currently waiting for safe disposition and family to make a decision regarding her  disposition.     Vision loss in the left eye.  Chronic blindness in the right eye.  Suspect progression of glaucoma as per Dr Amanda Cochran with Ophthalmology.  Recommended outpatient follow up with Dr Amanda Cochran.    E coli UTI and possible atypical pneumonia:  Completed the course of antibiotics.   AKI on stage 3a CKD Creatinine back to baseline.   Chronic systolic CHF: LAST EF is 30 to 35% with indeterminate diastolic dysfunction.  Continue with eliquis, benazepril 20 mg daily and metoprolol 50 mg daily.  She is on 2 lit of [place] oxygen, not sob this morning.    Hypokalemia:  Replaced. Repeat level wnl.    Hypothyroidism:  Resume synthroid 100 mcg daily.    H/o CVA;  Reviewed the MRI brain without  contrast.  Legally blind.   Poor Oral intake:  - eating less than 25% of her meals.  -- will add megace to help with her appetite.     Estimated body mass index is 23 kg/m as calculated from the following:   Height as of this encounter: 5\' 5"  (1.651 m).   Weight as of this encounter: 62.7 kg.  Code Status: full code.  DVT Prophylaxis:  SCDs Start: 08/20/22 0644 apixaban (ELIQUIS) tablet 5 mg   Level of Care: Level of care: Med-Surg Family Communication: None at bedside.   Disposition Plan:     Remains inpatient appropriate:  SNF placement. Patient is medically stable for discharge.  Waiting for safe to disposition.   Procedures:  MRI BRAIN AND ORBITS.   Consultants:   Psychiatry.  Palliative care Ophthalmology  Antimicrobials:   Anti-infectives (From admission, onward)    Start     Dose/Rate Route Frequency Ordered Stop   08/24/22 2200  amoxicillin-clavulanate (AUGMENTIN) 500-125 MG per tablet 1 tablet        1 tablet Oral 2 times daily 08/24/22 1427 08/26/22 2228   08/24/22 1000  amoxicillin-clavulanate (AUGMENTIN) 875-125 MG per tablet 1 tablet  Status:  Discontinued        1 tablet Oral Every 12 hours 08/23/22 1255 08/24/22 1427   08/23/22 1345  doxycycline (VIBRA-TABS) tablet 100 mg  Status:  Discontinued        100 mg Oral Every 12 hours 08/23/22 1255 08/27/22 1425   08/21/22 0800  cefTRIAXone (ROCEPHIN) 1 g in sodium chloride 0.9 % 100 mL IVPB  Status:  Discontinued        1 g 200 mL/hr over 30 Minutes Intravenous Every 24 hours 08/20/22 0644 08/23/22 1255   08/20/22 0600  cefTRIAXone (ROCEPHIN) 1 g in sodium chloride 0.9 % 100 mL IVPB        1 g 200 mL/hr over 30 Minutes Intravenous  Once 08/20/22 0557 08/20/22 0808        Medications  Scheduled Meds:  apixaban  5 mg Oral BID   benazepril  20 mg Oral Daily   cholecalciferol  2,000 Units Oral Daily   divalproex  125 mg Oral Q8H   dorzolamide-timolol  1 drop Both Eyes BID   feeding supplement  237 mL  Oral TID BM   latanoprost  1 drop Both Eyes QHS   levothyroxine  100 mcg Oral Q0600   megestrol  400 mg Oral Daily   metoprolol succinate  50 mg Oral Daily   pilocarpine  1 drop Both Eyes QID   thiamine  100 mg Oral Daily   Continuous Infusions: PRN Meds:.acetaminophen **OR** acetaminophen, ondansetron (ZOFRAN) IV  Subjective:   Amanda Cochran was seen and examined today.   No new complaints, reports the food is not good.   Objective:   Vitals:   09/06/22 1932 09/07/22 0431 09/07/22 0528 09/07/22 1429  BP: (!) 148/74 139/62  (!) 143/80  Pulse: 77 (!) 52  76  Resp: 20 18  15   Temp: 97.8 F (36.6 C) (!) 97.4 F (36.3 C)  97.7 F (36.5 C)  TempSrc: Oral Oral  Oral  SpO2: 94% 98%  94%  Weight:   62.7 kg   Height:        Intake/Output Summary (Last 24 hours) at 09/07/2022 1532 Last data filed at 09/07/2022 1217 Gross per 24 hour  Intake 220 ml  Output --  Net 220 ml    Filed Weights   09/03/22 0443 09/05/22 0500 09/07/22 0528  Weight: 63.6 kg 59.2 kg 62.7 kg     Exam General exam: frail elderly lady not in distress. Respiratory system: diminished air entry at bases.  Cardiovascular system: S1 & S2 heard, RRR. No JVD,  Gastrointestinal system: Abdomen is nondistended, soft and nontender.  Central nervous system: Alert and oriented to person and place  Extremities: no edema.  Skin: No rashes,         Data Reviewed:  I have personally reviewed following labs and imaging studies   CBC Lab Results  Component Value Date   WBC 7.9 09/05/2022   RBC 4.74 09/05/2022   HGB 13.7 09/05/2022   HCT 44.6 09/05/2022   MCV 94.1 09/05/2022   MCH 28.9 09/05/2022   PLT 158 09/05/2022   MCHC 30.7 09/05/2022   RDW 17.2 (H) 09/05/2022   LYMPHSABS 2.2 09/05/2022   MONOABS 0.5 09/05/2022   EOSABS 0.2 09/05/2022   BASOSABS 0.1 09/05/2022     Last metabolic panel Lab Results  Component Value Date   NA 142 09/05/2022   K 3.4 (L) 09/05/2022   CL 105 09/05/2022    CO2 28 09/05/2022   BUN 26 (H) 09/05/2022   CREATININE 0.97 09/05/2022   GLUCOSE 91 09/05/2022   GFRNONAA 55 (L) 09/05/2022   GFRAA >60 01/14/2019   CALCIUM 8.4 (L) 09/05/2022   PHOS 3.4 08/21/2022   PROT 5.7 (L) 09/02/2022   ALBUMIN 3.0 (L) 09/02/2022   BILITOT 0.9 09/02/2022   ALKPHOS 37 (L) 09/02/2022   AST 23 09/02/2022   ALT 23 09/02/2022   ANIONGAP 9 09/05/2022    CBG (last 3)  No results for input(s): "GLUCAP" in the last 72 hours.    Coagulation Profile: No results for input(s): "INR", "PROTIME" in the last 168 hours.   Radiology Studies: No results found.     Kathlen Mody M.D. [facility] 09/07/2022, 3:32 PM  Available via Epic secure chat 7am-7pm After 7 pm, please refer to night coverage provider listed on amion.

## 2022-09-07 NOTE — TOC Progression Note (Signed)
Transition of Care [facility]) - Progression Note    Patient Details  Name: Amanda Cochran MRN: [id number] Date of Birth: 1928-07-31  [facility]) CM/SW Contact  Beckie Busing, RN Phone Number:[phone]  09/07/2022, 12:22 PM  Clinical Narrative:    TOC continues to follow for disposition planning. Cm called nephew [name] to determine is Thayer Ohm willing to sign for patient to admit into SNF . Thayer Ohm questions wanting to know if patient is still suspicious. CM has explained that patient has intermittent confusion and that MD currently does not feel that patient is safe to be at home alone. Nephew states that he will need to talk with patient first before he can just make a decision. Nephew states that patient was not admitted because she had a fall or accident due to her age. Nephew states that patient was admitted because she had an infection that compromised her. Nephew states that patient has been at home safe for many years with people checking on her abnd that she has not had problems from staying at home alone. Nephew states that he will come to visit patient this evening to speak with her and give her a chance to voice her opinion on what she wants. Nephew states that he can not sign without having this conversation with the patient.   MD has been updated and will attempt to reach out to nephew.    Expected Discharge Plan: Home/Self Care Barriers to Discharge: ED Unsafe disposition  Expected Discharge Plan and Services   Discharge Planning Services: CM Consult   Living arrangements for the past 2 months: Single Family Home Expected Discharge Date: 08/29/22                                     Social Determinants of Health (SDOH) Interventions SDOH Screenings   Food Insecurity: No Food Insecurity (08/21/2022)  Housing: Low Risk  (08/21/2022)  Transportation Needs: No Transportation Needs (08/21/2022)  Recent Concern: Transportation Needs - Unmet Transportation  Needs (08/20/2022)  Utilities: Not At Risk (08/21/2022)  Tobacco Use: Low Risk  (08/20/2022)    Readmission Risk Interventions     No data to display

## 2022-09-07 NOTE — Plan of Care (Signed)

## 2022-09-08 DIAGNOSIS — G9341 Metabolic encephalopathy: Secondary | ICD-10-CM | POA: Diagnosis not present

## 2022-09-08 DIAGNOSIS — I10 Essential (primary) hypertension: Secondary | ICD-10-CM | POA: Diagnosis not present

## 2022-09-08 DIAGNOSIS — I482 Chronic atrial fibrillation, unspecified: Secondary | ICD-10-CM | POA: Diagnosis not present

## 2022-09-08 DIAGNOSIS — Z8673 Personal history of transient ischemic attack (TIA), and cerebral infarction without residual deficits: Secondary | ICD-10-CM | POA: Diagnosis not present

## 2022-09-08 NOTE — Progress Notes (Signed)
OT Cancellation Note  Patient Details Name: Amanda Cochran MRN: [id number] DOB: 1928-12-10   Cancelled Treatment:    Reason Eval/Treat Not Completed: Other (comment): OT spent 5 min in pt's room trying to motivate her to work, but pt adamantly refused stating, "I wish you'd shut up. I'm dying."   Pt repeating, "No one came on my birthday. Pt reminded that her birthday is in 2 days, but pt not retaining this information and perseverating on no one coming to see her. When asked who she would like to see, pt stated, "Nobody!"   Theodoro Clock 09/08/2022, 11:16 AM

## 2022-09-08 NOTE — TOC Progression Note (Signed)
Transition of Care [facility]) - Progression Note    Patient Details  Name: Amanda Cochran MRN: [id number] Date of Birth: June 23, 1928  Transition of Care [facility]) CM/SW Contact  Beckie Busing, RN Phone Number:[phone]  09/08/2022, 11:20 AM  Clinical Narrative:    Cm attempted to reach niece listed in contacts Sharla Kidney [phone]. There is no answer. Voicemail has been left.   Expected Discharge Plan: Home/Self Care Barriers to Discharge: ED Unsafe disposition  Expected Discharge Plan and Services   Discharge Planning Services: CM Consult   Living arrangements for the past 2 months: Single Family Home Expected Discharge Date: 08/29/22                                     Social Determinants of Health (SDOH) Interventions SDOH Screenings   Food Insecurity: No Food Insecurity (08/21/2022)  Housing: Low Risk  (08/21/2022)  Transportation Needs: No Transportation Needs (08/21/2022)  Recent Concern: Transportation Needs - Unmet Transportation Needs (08/20/2022)  Utilities: Not At Risk (08/21/2022)  Tobacco Use: Low Risk  (08/20/2022)    Readmission Risk Interventions     No data to display

## 2022-09-08 NOTE — Progress Notes (Signed)
[facility]                                                                               Amanda Cochran, is a 89 y.o. female, DOB - 11-Dec-1928, HYW:[id number] Admit date - 08/19/2022    Outpatient Primary MD for the patient is [name], MD  LOS - 18  days    Brief summary   89 y.o. female with medical history significant of open-angle glaucoma of both eyes with legal blindness, CVA with some residual right-sided weakness (uses a cane to ambulate), hyperlipidemia, hypertension, atrial fibrillation on Eliquis, hypothyroidism who was brought to the emergency department after she was found screaming in her house at "intruders."    Patient was felt to be suffering from acute metabolic encephalopathy secondary to possible urinary tract infection.  The hospitalist group was called to assess the patient for admission to the hospital.    Patient was placed on intravenous antibiotics with ceftriaxone.  During this hospitalization, echocardiogram revealed markedly low ejection fraction of 30 to 35% with indeterminate diastolic function and.  Patient did not appear to be in acute heart failure.  Case was discussed with cardiology and home regimen of beta-blocker therapy was titrated.  Doxycycline was added to the ceftriaxone later in the hospitalization due to additional concerns for possible pneumonia.  Over the course of the hospitalization the patient has exhibited bouts of agitation and delusions.  Psychiatry was consulted and felt the patient was likely suffering from residual delirium from her underlying illness and did not believe that the patient was suffering an underlying psychiatric condition.  Patient seen and examined she is teary and tells me that she cannot do much by herself. She wants to know if she can go home. We talked about safety issues, in view of her vision impairment and that she needs 24 hour care. Unfortunately her brother is not a position to help her, her  nephew and niece work and unable to provide 24 hour care. Discussed safe disposition with her brother  and nephew, waiting for family to get back .     Assessment & Plan    Assessment and Plan:  Acute encephalopathy probably secondary to paranoid delusions, in the setting of decline in cognitive status with some progressive confusion and dementia.  Please see detailed note from [name] who has spoken to the patient's brother and the [facility] regarding her mental status.  She was seen by psychiatry recommended to start the patient on Depakote. Work up has been negative so far for infection.  MRI brain  shows Moderate to severe chronic small-vessel disease with scattered areas of old infarction throughout the brain. Psychiatry consulted for capacity evaluation and recommendations given.  I do not think she is safe to go home by herself unless she had 24 hour care at home. She is high fall risk and the next fall might be fatal especially with her vision impairment and her being on eliquis.  She would benefit from SNF placement. Currently waiting for safe disposition and family to make a decision regarding her disposition.     Vision loss in the left eye.  Chronic blindness in the right  eye.  Suspect progression of glaucoma as per [name] with Ophthalmology.  Recommended outpatient follow up with [name].    E coli UTI and possible atypical pneumonia:  Completed the course of antibiotics.   AKI on stage 3a CKD Creatinine back to baseline.   Chronic systolic CHF: LAST EF is 30 to 35% with indeterminate diastolic dysfunction.  Continue with eliquis, benazepril 20 mg daily and metoprolol 50 mg daily.  She appears euvolemic.    Hypokalemia:  Replaced. Repeat level wnl.    Hypothyroidism:  Resume synthroid 100 mcg daily.    H/o CVA;  Reviewed the MRI brain without contrast.  Legally blind.   Poor Oral intake:  - eating less than 25% of her meals.  -- added megace to help  with her appetite.     Estimated body mass index is 22.75 kg/m as calculated from the following:   Height as of this encounter: 5\' 5"  (1.651 m).   Weight as of this encounter: 62 kg.  Code Status: full code.  DVT Prophylaxis:  SCDs Start: 08/20/22 0644 apixaban (ELIQUIS) tablet 5 mg   Level of Care: Level of care: Med-Surg Family Communication: None at bedside.   Disposition Plan:     Remains inpatient appropriate:  Patient is medically stable for discharge.  Waiting for safe to disposition.   Procedures:  MRI BRAIN AND ORBITS.   Consultants:   Psychiatry.  Palliative care Ophthalmology  Antimicrobials:   Anti-infectives (From admission, onward)    Start     Dose/Rate Route Frequency Ordered Stop   08/24/22 2200  amoxicillin-clavulanate (AUGMENTIN) 500-125 MG per tablet 1 tablet        1 tablet Oral 2 times daily 08/24/22 1427 08/26/22 2228   08/24/22 1000  amoxicillin-clavulanate (AUGMENTIN) 875-125 MG per tablet 1 tablet  Status:  Discontinued        1 tablet Oral Every 12 hours 08/23/22 1255 08/24/22 1427   08/23/22 1345  doxycycline (VIBRA-TABS) tablet 100 mg  Status:  Discontinued        100 mg Oral Every 12 hours 08/23/22 1255 08/27/22 1425   08/21/22 0800  cefTRIAXone (ROCEPHIN) 1 g in sodium chloride 0.9 % 100 mL IVPB  Status:  Discontinued        1 g 200 mL/hr over 30 Minutes Intravenous Every 24 hours 08/20/22 0644 08/23/22 1255   08/20/22 0600  cefTRIAXone (ROCEPHIN) 1 g in sodium chloride 0.9 % 100 mL IVPB        1 g 200 mL/hr over 30 Minutes Intravenous  Once 08/20/22 0557 08/20/22 4782        Medications  Scheduled Meds:  apixaban  5 mg Oral BID   benazepril  20 mg Oral Daily   cholecalciferol  2,000 Units Oral Daily   divalproex  125 mg Oral Q8H   dorzolamide-timolol  1 drop Both Eyes BID   feeding supplement  237 mL Oral TID BM   latanoprost  1 drop Both Eyes QHS   levothyroxine  100 mcg Oral Q0600   megestrol  400 mg Oral Daily   metoprolol  succinate  50 mg Oral Daily   pilocarpine  1 drop Both Eyes QID   thiamine  100 mg Oral Daily   Continuous Infusions: PRN Meds:.acetaminophen **OR** acetaminophen, ondansetron (ZOFRAN) IV    Subjective:   Akylah Carnevale was seen and examined today.   Confused, agitated.   Objective:   Vitals:   09/07/22 2200 09/08/22 0500 09/08/22 0520 09/08/22  1302  BP: (!) 153/68  (!) 163/70 (!) 145/81  Pulse: 75  62 67  Resp: 12  14 17   Temp: 97.6 F (36.4 C)  98.1 F (36.7 C) (!) 97.5 F (36.4 C)  TempSrc: Oral  Axillary Oral  SpO2: 93%  91% 93%  Weight:  62 kg    Height:        Intake/Output Summary (Last 24 hours) at 09/08/2022 1515 Last data filed at 09/08/2022 1004 Gross per 24 hour  Intake 160 ml  Output --  Net 160 ml    Filed Weights   09/05/22 0500 09/07/22 0528 09/08/22 0500  Weight: 59.2 kg 62.7 kg 62 kg     Exam General exam:  elderly frail woman not in distress.  Respiratory system: Clear to auscultation. Respiratory effort normal. Cardiovascular system: S1 & S2 heard, RRR.  Gastrointestinal system: Abdomen is nondistended, soft and nontender. Central nervous system: Alert, confused. Cognitive impairment , memory deficits with vision impairment . Legally blind.  Extremities: no pedal edema.  Skin: No rashes, Psychiatry: agitated,     Data Reviewed:  I have personally reviewed following labs and imaging studies   CBC Lab Results  Component Value Date   WBC 7.9 09/05/2022   RBC 4.74 09/05/2022   HGB 13.7 09/05/2022   HCT 44.6 09/05/2022   MCV 94.1 09/05/2022   MCH 28.9 09/05/2022   PLT 158 09/05/2022   MCHC 30.7 09/05/2022   RDW 17.2 (H) 09/05/2022   LYMPHSABS 2.2 09/05/2022   MONOABS 0.5 09/05/2022   EOSABS 0.2 09/05/2022   BASOSABS 0.1 09/05/2022     Last metabolic panel Lab Results  Component Value Date   NA 140 09/07/2022   K 3.7 09/07/2022   CL 108 09/07/2022   CO2 22 09/07/2022   BUN 21 09/07/2022   CREATININE 1.05 (H) 09/07/2022    GLUCOSE 75 09/07/2022   GFRNONAA 50 (L) 09/07/2022   GFRAA >60 01/14/2019   CALCIUM 8.3 (L) 09/07/2022   PHOS 3.4 08/21/2022   PROT 5.7 (L) 09/02/2022   ALBUMIN 3.0 (L) 09/02/2022   BILITOT 0.9 09/02/2022   ALKPHOS 37 (L) 09/02/2022   AST 23 09/02/2022   ALT 23 09/02/2022   ANIONGAP 10 09/07/2022    CBG (last 3)  No results for input(s): "GLUCAP" in the last 72 hours.    Coagulation Profile: No results for input(s): "INR", "PROTIME" in the last 168 hours.   Radiology Studies: No results found.     Kathlen Mody M.D. [facility] 09/08/2022, 3:15 PM  Available via Epic secure chat 7am-7pm After 7 pm, please refer to night coverage provider listed on amion.

## 2022-09-08 NOTE — Progress Notes (Addendum)
Physical Therapy Treatment Patient Details Name: Amanda Cochran MRN: [id number] DOB: 1928/10/16 Today's Date: 09/08/2022   History of Present Illness Pt is a 89yo female presenting to [facility] ED on 6/27 secondary AME with associated UTI, CT head negative for acute findings.   PMH: depression, hx of CVA (L basal ganglia infarct with R hemiparesis), Hld, HTN, hx of left eye lacunar stroke,    PT Comments  The patient reporting that her feet are too painful to get up at this time. Patient also stating that she wants to die. Patient assisted in bed mobility/repositioning.  Patient had been up to BR earlier. Continue PT for progressive mobility.   Patient irritable stating that she was taken home yesterday, reoriented  patient and  patient stated: Don't lie to me."    Assistance Recommended at Discharge Frequent or constant Supervision/Assistance  If plan is discharge home, recommend the following:  Can travel by private vehicle    A little help with walking and/or transfers;A little help with bathing/dressing/bathroom;Assistance with cooking/housework;Direct supervision/assist for medications management;Direct supervision/assist for financial management;Help with stairs or ramp for entrance;Assist for transportation   No  Equipment Recommendations  None recommended by PT    Recommendations for Other Services       Precautions / Restrictions Precautions Precautions: Fall Precaution Comments: vision impairment, HOH     Mobility  Bed Mobility   Bed Mobility: Rolling Rolling: Max assist         General bed mobility comments: patient declining to sit up, stating her feet were too painful. patient assisted with rolling and repositioning in the bed.    Transfers                        Ambulation/Gait                   Stairs             Wheelchair Mobility     Tilt Bed    Modified Rankin (Stroke Patients Only)       Balance                                             Cognition Arousal/Alertness: Awake/alert Behavior During Therapy: Anxious, Restless Overall Cognitive Status: No family/caregiver present to determine baseline cognitive functioning Area of Impairment: Orientation, Awareness, Safety/judgement, Following commands, Problem solving, Attention, Memory                 Orientation Level: Disoriented to, Time Current Attention Level: Selective Memory: Decreased short-term memory Following Commands: Follows one step commands inconsistently Safety/Judgement: Decreased awareness of deficits Awareness: Emergent Problem Solving: Requires verbal cues General Comments: declined participating, stating her feet are painful, stated" I want to die."(RN notified)        Exercises      General Comments        Pertinent Vitals/Pain Pain Assessment Faces Pain Scale: Hurts whole lot Pain Location: bil feet Pain Descriptors / Indicators: Grimacing, Sore, Tender Pain Intervention(s): Limited activity within patient's tolerance, Monitored during session    Home Living                          Prior Function            PT Goals (current goals can now be found  in the care plan section) Progress towards PT goals: Progressing toward goals    Frequency    Min 1X/week      PT Plan Discharge plan needs to be updated    Co-evaluation              AM-PAC PT "6 Clicks" Mobility   Outcome Measure  Help needed turning from your back to your side while in a flat bed without using bedrails?: A Lot Help needed moving from lying on your back to sitting on the side of a flat bed without using bedrails?: A Lot   Help needed standing up from a chair using your arms (e.g., wheelchair or bedside chair)?: Total Help needed to walk in hospital room?: Total Help needed climbing 3-5 steps with a railing? : Total 6 Click Score: 7    End of Session   Activity Tolerance: Patient limited by  pain Patient left: in bed;with call bell/phone within reach;with bed alarm set Nurse Communication: Mobility status (pt. comentas) PT Visit Diagnosis: Unsteadiness on feet (R26.81);Difficulty in walking, not elsewhere classified (R26.2);Muscle weakness (generalized) (M62.81)     Time: 1201-1220 PT Time Calculation (min) (ACUTE ONLY): 19 min  Charges:    $Therapeutic Activity: 8-22 mins PT General Charges $$ ACUTE PT VISIT: 1 Visit                     Blanchard Kelch PT Acute Rehabilitation Services Office [phone] Weekend [phone]    Rada Hay 09/08/2022, 1:26 PM

## 2022-09-09 DIAGNOSIS — G9341 Metabolic encephalopathy: Secondary | ICD-10-CM | POA: Diagnosis not present

## 2022-09-09 NOTE — Plan of Care (Signed)
  Problem: Education: Goal: Knowledge of General Education information will improve Description: Including pain rating scale, medication(s)/side effects and non-pharmacologic comfort measures 09/09/2022 0126 by Andre Lefort, RN Outcome: Progressing 09/09/2022 0125 by Andre Lefort, RN Outcome: Progressing   Problem: Clinical Measurements: Goal: Will remain free from infection 09/09/2022 0126 by Andre Lefort, RN Outcome: Progressing 09/09/2022 0125 by Andre Lefort, RN Outcome: Progressing   Problem: Activity: Goal: Risk for activity intolerance will decrease 09/09/2022 0126 by Andre Lefort, RN Outcome: Progressing 09/09/2022 0125 by Andre Lefort, RN Outcome: Progressing

## 2022-09-09 NOTE — TOC Progression Note (Signed)
Transition of Care [facility]) - Progression Note    Patient Details  Name: Amanda Cochran MRN: [id number] Date of Birth: 1928/07/07  Transition of [facility]) CM/SW Contact  Beckie Busing, RN Phone Number:[phone]  09/09/2022, 3:47 PM  Clinical Narrative:    Cm at bedside to discuss disposition planning with patient . Patient does have rambling conversation and talks about a couple sleeping in her bed and other things. Patient redirected to plan for short term rehab after discharge. Patient states that she does not want anyone making her decisions because she is capable of deciding what she wants. Patient recalls that ewhen her husband was sick , he had to go to a rehab facility. Patient states that the only facility that she will go to is [facility]. CM has reached out to [facility] [employer] [facility]. Per Gavin Potters does not have any vacant beds at this time. CM to follow up with [facility] tomorrow to determine possible bed availability.    Expected Discharge Plan: Home/Self Care Barriers to Discharge: ED Unsafe disposition  Expected Discharge Plan and Services   Discharge Planning Services: CM Consult   Living arrangements for the past 2 months: Single Family Home Expected Discharge Date: 08/29/22                                     Social Determinants of Health (SDOH) Interventions SDOH Screenings   Food Insecurity: No Food Insecurity (08/21/2022)  Housing: Low Risk  (08/21/2022)  Transportation Needs: No Transportation Needs (08/21/2022)  Recent Concern: Transportation Needs - Unmet Transportation Needs (08/20/2022)  Utilities: Not At Risk (08/21/2022)  Tobacco Use: Low Risk  (08/20/2022)    Readmission Risk Interventions     No data to display

## 2022-09-09 NOTE — Progress Notes (Signed)
PROGRESS NOTE    Amanda Cochran  WGN:[id number] DOB: 01/04/1929 DOA: 08/19/2022 PCP: [name], MD   Brief Narrative:  89 y.o. female with medical history significant of open-angle glaucoma of both eyes with legal blindness, CVA with some residual right-sided weakness (uses a cane to ambulate), hyperlipidemia, hypertension, atrial fibrillation on Eliquis, hypothyroidism who was brought to the emergency department after she was found screaming in her house at "intruders."     Patient was felt to be suffering from acute metabolic encephalopathy secondary to possible urinary tract infection.  The hospitalist group was called to assess the patient for admission to the hospital.     Patient was placed on intravenous antibiotics with ceftriaxone.  During this hospitalization, echocardiogram revealed markedly low ejection fraction of 30 to 35% with indeterminate diastolic function and.  Patient did not appear to be in acute heart failure.  Case was discussed with cardiology and home regimen of beta-blocker therapy was titrated.   Doxycycline was added to the ceftriaxone later in the hospitalization due to additional concerns for possible pneumonia.   Over the course of the hospitalization the patient has exhibited bouts of agitation and delusions.  Psychiatry was consulted and felt the patient was likely suffering from residual delirium from her underlying illness and did not believe that the patient was suffering an underlying psychiatric condition.   Patient seen and examined she is teary and tells me that she cannot do much by herself. She wants to know if she can go home. We talked about safety issues, in view of her vision impairment and that she needs 24 hour care. Unfortunately her brother is not a position to help her, her nephew and niece work and unable to provide 24 hour care. Discussed safe disposition with her brother  and nephew, waiting for family to get back .     Assessment & Plan:    Principal Problem:   Acute metabolic encephalopathy Active Problems:   E-coli UTI   Pneumonia of left lower lobe due to infectious organism   Paranoid delusion (HCC)   Chronic systolic CHF (congestive heart failure) (HCC)   Acute renal failure superimposed on stage 3a chronic kidney disease (HCC)   Atrial fibrillation, chronic (HCC)   Essential hypertension   Hypothyroidism   Hyperlipidemia   Right hemiparesis (HCC)   Left sided lacunar stroke (HCC)   History of CVA (cerebrovascular accident)   MCI (mild cognitive impairment)   Primary open angle glaucoma of both eyes, severe stage   Acute HFrEF (heart failure with reduced ejection fraction) (HCC)   Osteoarthritis  Acute encephalopathy probably secondary to paranoid delusions, in the setting of decline in cognitive status with some progressive confusion and dementia.  Please see detailed note from [name] who has spoken to the patient's brother and the [facility] regarding her mental status.  She was seen by psychiatry recommended to start the patient on Depakote. Work up has been negative so far for infection.  MRI brain  shows Moderate to severe chronic small-vessel disease with scattered areas of old infarction throughout the brain. Psychiatry consulted for capacity evaluation and recommendations given.  I do not think she is safe to go home by herself unless she had 24 hour care at home. She is high fall risk and the next fall might be fatal especially with her vision impairment and her being on eliquis.  She would benefit from SNF placement. Currently waiting for safe disposition and family to make a decision regarding her  disposition.    Vision loss in the left eye.  Chronic blindness in the right eye.  Suspect progression of glaucoma as per [name] with Ophthalmology.  Recommended outpatient follow up with [name].    E coli UTI and possible atypical pneumonia:  Completed the course of antibiotics.   AKI on stage 3a  CKD Creatinine back to baseline.    Chronic systolic CHF: LAST EF is 30 to 35% with indeterminate diastolic dysfunction.  Continue with eliquis, benazepril 20 mg daily and metoprolol 50 mg daily.  She appears euvolemic.   Hypothyroidism:  Continue synthroid 100 mcg daily.    H/o CVA;  Reviewed the MRI brain without contrast.  Legally blind.    Poor Oral intake:  - eating less than 25% of her meals.  -- added megace to help with her appetite.    DVT prophylaxis: SCDs Start: 08/20/22 0644   Code Status: Full Code  Family Communication:  None present at bedside.    Status is: Inpatient Remains inpatient appropriate because: Patient medically stable, pending placement to SNF.   Estimated body mass index is 22.75 kg/m as calculated from the following:   Height as of this encounter: 5\' 5"  (1.651 m).   Weight as of this encounter: 62 kg.    Nutritional Assessment: Body mass index is 22.75 kg/m.Marland Kitchen Seen by dietician.  I agree with the assessment and plan as outlined below: Nutrition Status:        . Skin Assessment: I have examined the patient's skin and I agree with the wound assessment as performed by the wound care RN as outlined below:    Consultants:  Psychiatry-Signed off  Procedures:  Plan  Antimicrobials:  Anti-infectives (From admission, onward)    Start     Dose/Rate Route Frequency Ordered Stop   08/24/22 2200  amoxicillin-clavulanate (AUGMENTIN) 500-125 MG per tablet 1 tablet        1 tablet Oral 2 times daily 08/24/22 1427 08/26/22 2228   08/24/22 1000  amoxicillin-clavulanate (AUGMENTIN) 875-125 MG per tablet 1 tablet  Status:  Discontinued        1 tablet Oral Every 12 hours 08/23/22 1255 08/24/22 1427   08/23/22 1345  doxycycline (VIBRA-TABS) tablet 100 mg  Status:  Discontinued        100 mg Oral Every 12 hours 08/23/22 1255 08/27/22 1425   08/21/22 0800  cefTRIAXone (ROCEPHIN) 1 g in sodium chloride 0.9 % 100 mL IVPB  Status:  Discontinued         1 g 200 mL/hr over 30 Minutes Intravenous Every 24 hours 08/20/22 0644 08/23/22 1255   08/20/22 0600  cefTRIAXone (ROCEPHIN) 1 g in sodium chloride 0.9 % 100 mL IVPB        1 g 200 mL/hr over 30 Minutes Intravenous  Once 08/20/22 0557 08/20/22 0865         Subjective: Patient seen and examined.  Her speech is slightly incomprehensible.  She would not repeat her statements or answers when asked.  Appears to be very paranoid and frustrated.  Would not let me touch her at all.  Objective: Vitals:   09/08/22 0500 09/08/22 0520 09/08/22 1302 09/08/22 2018  BP:  (!) 163/70 (!) 145/81 129/81  Pulse:  62 67 74  Resp:  14 17 18   Temp:  98.1 F (36.7 C) (!) 97.5 F (36.4 C) 98.5 F (36.9 C)  TempSrc:  Axillary Oral Oral  SpO2:  91% 93% 95%  Weight: 62 kg  Height:        Intake/Output Summary (Last 24 hours) at 09/09/2022 0836 Last data filed at 09/08/2022 1004 Gross per 24 hour  Intake 60 ml  Output --  Net 60 ml   Filed Weights   09/05/22 0500 09/07/22 0528 09/08/22 0500  Weight: 59.2 kg 62.7 kg 62 kg    Examination: Examination could not be performed, patient not allowing me to touch her at all.   Data Reviewed: I have personally reviewed following labs and imaging studies  CBC: Recent Labs  Lab 09/02/22 1014 09/05/22 0543  WBC 7.7 7.9  NEUTROABS 5.1 4.9  HGB 14.0 13.7  HCT 45.0 44.6  MCV 93.4 94.1  PLT 167 158   Basic Metabolic Panel: Recent Labs  Lab 09/02/22 1014 09/05/22 0543 09/07/22 1532  NA 143 142 140  K 3.6 3.4* 3.7  CL 106 105 108  CO2 27 28 22   GLUCOSE 91 91 75  BUN 25* 26* 21  CREATININE 1.15* 0.97 1.05*  CALCIUM 8.5* 8.4* 8.3*   GFR: Estimated Creatinine Clearance: 31.4 mL/min (A) (by C-G formula based on SCr of 1.05 mg/dL (H)). Liver Function Tests: Recent Labs  Lab 09/02/22 1014  AST 23  ALT 23  ALKPHOS 37*  BILITOT 0.9  PROT 5.7*  ALBUMIN 3.0*   No results for input(s): "LIPASE", "AMYLASE" in the last 168  hours. Recent Labs  Lab 09/02/22 1014  AMMONIA 26   Coagulation Profile: No results for input(s): "INR", "PROTIME" in the last 168 hours. Cardiac Enzymes: No results for input(s): "CKTOTAL", "CKMB", "CKMBINDEX", "TROPONINI" in the last 168 hours. BNP (last 3 results) No results for input(s): "PROBNP" in the last 8760 hours. HbA1C: No results for input(s): "HGBA1C" in the last 72 hours. CBG: No results for input(s): "GLUCAP" in the last 168 hours. Lipid Profile: No results for input(s): "CHOL", "HDL", "LDLCALC", "TRIG", "CHOLHDL", "LDLDIRECT" in the last 72 hours. Thyroid Function Tests: No results for input(s): "TSH", "T4TOTAL", "FREET4", "T3FREE", "THYROIDAB" in the last 72 hours. Anemia Panel: No results for input(s): "VITAMINB12", "FOLATE", "FERRITIN", "TIBC", "IRON", "RETICCTPCT" in the last 72 hours. Sepsis Labs: No results for input(s): "PROCALCITON", "LATICACIDVEN" in the last 168 hours.  No results found for this or any previous visit (from the past 240 hour(s)).   Radiology Studies: No results found.  Scheduled Meds:  apixaban  5 mg Oral BID   benazepril  20 mg Oral Daily   cholecalciferol  2,000 Units Oral Daily   divalproex  125 mg Oral Q8H   dorzolamide-timolol  1 drop Both Eyes BID   feeding supplement  237 mL Oral TID BM   latanoprost  1 drop Both Eyes QHS   levothyroxine  100 mcg Oral Q0600   megestrol  400 mg Oral Daily   metoprolol succinate  50 mg Oral Daily   pilocarpine  1 drop Both Eyes QID   thiamine  100 mg Oral Daily   Continuous Infusions:   LOS: 19 days   Hughie Closs, MD [facility]  09/09/2022, 8:36 AM   *Please note that this is a verbal dictation therefore any spelling or grammatical errors are due to the "[employer]agon [employer] One" system interpretation.  Please page via Amion and do not message via secure chat for urgent patient care matters. Secure chat can be used for non urgent patient care matters.  How to contact the [facility]  Attending or Consulting provider 7A - 7P or covering provider during after hours 7P -7A, for this patient?  Check the care team in [facility] and look for a) attending/consulting TRH provider listed and b) the [facility] team listed. Page or secure chat 7A-7P. Log into [email or web address] and use [place]'s universal password to access. If you do not have the password, please contact the hospital operator. Locate the [facility] provider you are looking for under [facility] and page to a number that you can be directly reached. If you still have difficulty reaching the provider, please page the [facility] (Director on Call) for the Hospitalists listed on amion for assistance.

## 2022-09-09 NOTE — Progress Notes (Signed)
Occupational Therapy Treatment Patient Details Name: Amanda Cochran MRN: [id number] DOB: [birth year] Today's Date: 09/09/2022   History of present illness Pt is a 89 yr old female presenting to [facility] ED on 6/27 secondary AME with associated UTI, CT head negative for acute findings.   PMH: depression, hx of CVA (L basal ganglia infarct with R hemiparesis), Hld, HTN, hx of left eye lacunar stroke,   OT comments  Pt was seen for functional strengthening, increasing ADL participation, and for increasing out of bed activity. She performed lower body dressing at bed level, simple self feeding seated EOB, sit to stand, and ambulating in the hall using RW. She reported pain in her feet, which she stated "burns." She required min guard assist for standing activities. She presented with good participation, though she required consistent redirection to tasks, as she often liked to talk about her past life experiences. Continue OT plan of care. Patient will benefit from continued inpatient follow up therapy, <3 hours/day.    Recommendations for follow up therapy are one component of a multi-disciplinary discharge planning process, led by the attending physician.  Recommendations may be updated based on patient status, additional functional criteria and insurance authorization.    Assistance Recommended at Discharge Frequent or constant Supervision/Assistance  Patient can return home with the following  A little help with walking and/or transfers;A little help with bathing/dressing/bathroom;Assist for transportation;Assistance with cooking/housework;Direct supervision/assist for financial management;Direct supervision/assist for medications management   Equipment Recommendations  Other (comment) (defer to next level of care)    Recommendations for Other Services      Precautions / Restrictions Precautions Precautions: Fall Precaution Comments: vision impairment, HOH Restrictions Weight Bearing Restrictions:  Yes       Mobility Bed Mobility   Bed Mobility: Supine to Sit     Supine to sit: HOB elevated, Min assist          Transfers Overall transfer level: Needs assistance Equipment used: Rolling walker (2 wheels) Transfers: Sit to/from Stand Sit to Stand: Min guard                     ADL either performed or assessed with clinical judgement   ADL Overall ADL's : Needs assistance/impaired Eating/Feeding: Set up;Sitting Eating/Feeding Details (indicate cue type and reason): She drank water from a cup seated EOB Grooming: Min guard;Standing           Upper Body Dressing : Minimal assistance;Sitting   Lower Body Dressing: Supervision/safety;Set up Lower Body Dressing Details (indicate cue type and reason): She doffed then donned her socks while at bed level, while implementing the figure 4 technique Toilet Transfer: Minimal assistance;Ambulation;Rolling walker (2 wheels)                   Vision   Additional Comments: pt reported being blind, however indicated she can see shadows          Cognition Arousal/Alertness: Awake/alert   Overall Cognitive Status: No family/caregiver present to determine baseline cognitive functioning Area of Impairment: Memory, Orientation, Safety/judgement, Awareness        Memory: Decreased short-term memory                             Pertinent Vitals/ Pain       Pain Assessment Pain Assessment: Faces Pain Score: 4  Pain Location: feet in standing Pain Descriptors / Indicators: Burning Pain Intervention(s): Limited activity within patient's tolerance, Monitored  during session         Frequency  Min 1X/week        Progress Toward Goals  OT Goals(current goals can now be found in the care plan section)  Progress towards OT goals: Progressing toward goals  Acute Rehab OT Goals OT Goal Formulation: With patient Time For Goal Achievement: 09/19/22 Potential to Achieve Goals: Good  Plan  Discharge plan remains appropriate       AM-PAC OT "6 Clicks" Daily Activity     Outcome Measure   Help from another person eating meals?: A Little Help from another person taking care of personal grooming?: A Little Help from another person toileting, which includes using toliet, bedpan, or urinal?: A Little Help from another person bathing (including washing, rinsing, drying)?: A Lot Help from another person to put on and taking off regular upper body clothing?: A Little Help from another person to put on and taking off regular lower body clothing?: A Little 6 Click Score: 17    End of Session Equipment Utilized During Treatment: Rolling walker (2 wheels)  OT Visit Diagnosis: Other symptoms and signs involving cognitive function;Low vision, both eyes (H54.2);Unsteadiness on feet (R26.81)   Activity Tolerance Patient tolerated treatment well   Patient Left in bed;with call bell/phone within reach;with bed alarm set   Nurse Communication Mobility status        Time: 1308-6578 OT Time Calculation (min): 17 min  Charges: OT General Charges $OT Visit: 1 Visit OT Treatments $Therapeutic Activity: 8-22 mins     Reuben Likes, OTR/L 09/09/2022, 3:56 PM

## 2022-09-10 DIAGNOSIS — G9341 Metabolic encephalopathy: Secondary | ICD-10-CM | POA: Diagnosis not present

## 2022-09-10 NOTE — Plan of Care (Signed)

## 2022-09-10 NOTE — Progress Notes (Signed)
PROGRESS NOTE    Amanda Cochran  ZOX:[id number] DOB: 11/07/1928 DOA: 08/19/2022 PCP: [name], MD   Brief Narrative:  89 y.o. female with medical history significant of open-angle glaucoma of both eyes with legal blindness, CVA with some residual right-sided weakness (uses a cane to ambulate), hyperlipidemia, hypertension, atrial fibrillation on Eliquis, hypothyroidism who was brought to the emergency department after she was found screaming in her house at "intruders."     Patient was felt to be suffering from acute metabolic encephalopathy secondary to possible urinary tract infection.  The hospitalist group was called to assess the patient for admission to the hospital.     Patient was placed on intravenous antibiotics with ceftriaxone.  During this hospitalization, echocardiogram revealed markedly low ejection fraction of 30 to 35% with indeterminate diastolic function and.  Patient did not appear to be in acute heart failure.  Case was discussed with cardiology and home regimen of beta-blocker therapy was titrated.   Doxycycline was added to the ceftriaxone later in the hospitalization due to additional concerns for possible pneumonia.   Over the course of the hospitalization the patient has exhibited bouts of agitation and delusions.  Psychiatry was consulted and felt the patient was likely suffering from residual delirium from her underlying illness and did not believe that the patient was suffering an underlying psychiatric condition.   Patient seen and examined she is teary and tells me that she cannot do much by herself. She wants to know if she can go home. We talked about safety issues, in view of her vision impairment and that she needs 24 hour care. Unfortunately her brother is not a position to help her, her nephew and niece work and unable to provide 24 hour care. Discussed safe disposition with her brother  and nephew, waiting for family to get back .     Assessment & Plan:    Principal Problem:   Acute metabolic encephalopathy Active Problems:   E-coli UTI   Pneumonia of left lower lobe due to infectious organism   Paranoid delusion (HCC)   Chronic systolic CHF (congestive heart failure) (HCC)   Acute renal failure superimposed on stage 3a chronic kidney disease (HCC)   Atrial fibrillation, chronic (HCC)   Essential hypertension   Hypothyroidism   Hyperlipidemia   Right hemiparesis (HCC)   Left sided lacunar stroke (HCC)   History of CVA (cerebrovascular accident)   MCI (mild cognitive impairment)   Primary open angle glaucoma of both eyes, severe stage   Acute HFrEF (heart failure with reduced ejection fraction) (HCC)   Osteoarthritis  Acute encephalopathy probably secondary to paranoid delusions, in the setting of decline in cognitive status with some progressive confusion and dementia.  Please see detailed note from [name] who has spoken to the patient's brother and the [facility] regarding her mental status.  She was seen by psychiatry recommended to start the patient on Depakote. Work up has been negative so far for infection.  MRI brain  shows Moderate to severe chronic small-vessel disease with scattered areas of old infarction throughout the brain. Psychiatry consulted for capacity evaluation and recommendations given.  I do not think she is safe to go home by herself unless she had 24 hour care at home. She is high fall risk and the next fall might be fatal especially with her vision impairment and her being on eliquis.  She would benefit from SNF placement. Currently waiting for safe disposition and family to make a decision regarding her  disposition.    Vision loss in the left eye.  Chronic blindness in the right eye.  Suspect progression of glaucoma as per [name] with Ophthalmology.  Recommended outpatient follow up with [name].    E coli UTI and possible atypical pneumonia:  Completed the course of antibiotics.   AKI on stage 3a  CKD Creatinine back to baseline.    Chronic systolic CHF: LAST EF is 30 to 35% with indeterminate diastolic dysfunction.  Continue with eliquis, benazepril 20 mg daily and metoprolol 50 mg daily.  She appears euvolemic.   Hypothyroidism:  Continue synthroid 100 mcg daily.    H/o CVA;  Reviewed the MRI brain without contrast.  Legally blind.    Poor Oral intake:  - eating less than 25% of her meals.  -- added megace to help with her appetite.    DVT prophylaxis: SCDs Start: 08/20/22 0644   Code Status: Full Code  Family Communication:  None present at bedside.    Status is: Inpatient Remains inpatient appropriate because: Patient medically stable, pending placement to SNF.   Estimated body mass index is 21.9 kg/m as calculated from the following:   Height as of this encounter: 5\' 5"  (1.651 m).   Weight as of this encounter: 59.7 kg.    Nutritional Assessment: Body mass index is 21.9 kg/m.Marland Kitchen Seen by dietician.  I agree with the assessment and plan as outlined below: Nutrition Status:        . Skin Assessment: I have examined the patient's skin and I agree with the wound assessment as performed by the wound care RN as outlined below:    Consultants:  Psychiatry-Signed off  Procedures:  Plan  Antimicrobials:  Anti-infectives (From admission, onward)    Start     Dose/Rate Route Frequency Ordered Stop   08/24/22 2200  amoxicillin-clavulanate (AUGMENTIN) 500-125 MG per tablet 1 tablet        1 tablet Oral 2 times daily 08/24/22 1427 08/26/22 2228   08/24/22 1000  amoxicillin-clavulanate (AUGMENTIN) 875-125 MG per tablet 1 tablet  Status:  Discontinued        1 tablet Oral Every 12 hours 08/23/22 1255 08/24/22 1427   08/23/22 1345  doxycycline (VIBRA-TABS) tablet 100 mg  Status:  Discontinued        100 mg Oral Every 12 hours 08/23/22 1255 08/27/22 1425   08/21/22 0800  cefTRIAXone (ROCEPHIN) 1 g in sodium chloride 0.9 % 100 mL IVPB  Status:  Discontinued         1 g 200 mL/hr over 30 Minutes Intravenous Every 24 hours 08/20/22 0644 08/23/22 1255   08/20/22 0600  cefTRIAXone (ROCEPHIN) 1 g in sodium chloride 0.9 % 100 mL IVPB        1 g 200 mL/hr over 30 Minutes Intravenous  Once 08/20/22 0557 08/20/22 1610         Subjective: Patient seen and examined.  Sleepy but arousable.  To any questions, she keeps answering " I am dead, I am in the hell".  Would not engage in any meaningful conversation.  Objective: Vitals:   09/09/22 1317 09/09/22 2131 09/10/22 0500 09/10/22 0520  BP: 130/76 (!) 159/75  (!) 168/97  Pulse: 69 (!) 59  63  Resp: (!) 22 14  14   Temp: 98 F (36.7 C) 98 F (36.7 C)  97.6 F (36.4 C)  TempSrc: Oral Oral  Oral  SpO2: 92% 98%  96%  Weight:   61.6 kg 59.7 kg  Height:  Intake/Output Summary (Last 24 hours) at 09/10/2022 0952 Last data filed at 09/09/2022 1031 Gross per 24 hour  Intake 240 ml  Output --  Net 240 ml   Filed Weights   09/08/22 0500 09/10/22 0500 09/10/22 0520  Weight: 62 kg 61.6 kg 59.7 kg    Examination: General exam: Appears calm and comfortable  Respiratory system: Clear to auscultation. Respiratory effort normal. Cardiovascular system: S1 & S2 heard, RRR. No JVD, murmurs, rubs, gallops or clicks. No pedal edema. Gastrointestinal system: Abdomen is nondistended, soft and nontender. No organomegaly or masses felt. Normal bowel sounds heard. Central nervous system: Alert but likely not oriented.  Data Reviewed: I have personally reviewed following labs and imaging studies  CBC: Recent Labs  Lab 09/05/22 0543  WBC 7.9  NEUTROABS 4.9  HGB 13.7  HCT 44.6  MCV 94.1  PLT 158   Basic Metabolic Panel: Recent Labs  Lab 09/05/22 0543 09/07/22 1532  NA 142 140  K 3.4* 3.7  CL 105 108  CO2 28 22  GLUCOSE 91 75  BUN 26* 21  CREATININE 0.97 1.05*  CALCIUM 8.4* 8.3*   GFR: Estimated Creatinine Clearance: 30.8 mL/min (A) (by C-G formula based on SCr of 1.05 mg/dL (H)). Liver  Function Tests: No results for input(s): "AST", "ALT", "ALKPHOS", "BILITOT", "PROT", "ALBUMIN" in the last 168 hours.  No results for input(s): "LIPASE", "AMYLASE" in the last 168 hours. No results for input(s): "AMMONIA" in the last 168 hours.  Coagulation Profile: No results for input(s): "INR", "PROTIME" in the last 168 hours. Cardiac Enzymes: No results for input(s): "CKTOTAL", "CKMB", "CKMBINDEX", "TROPONINI" in the last 168 hours. BNP (last 3 results) No results for input(s): "PROBNP" in the last 8760 hours. HbA1C: No results for input(s): "HGBA1C" in the last 72 hours. CBG: No results for input(s): "GLUCAP" in the last 168 hours. Lipid Profile: No results for input(s): "CHOL", "HDL", "LDLCALC", "TRIG", "CHOLHDL", "LDLDIRECT" in the last 72 hours. Thyroid Function Tests: No results for input(s): "TSH", "T4TOTAL", "FREET4", "T3FREE", "THYROIDAB" in the last 72 hours. Anemia Panel: No results for input(s): "VITAMINB12", "FOLATE", "FERRITIN", "TIBC", "IRON", "RETICCTPCT" in the last 72 hours. Sepsis Labs: No results for input(s): "PROCALCITON", "LATICACIDVEN" in the last 168 hours.  No results found for this or any previous visit (from the past 240 hour(s)).   Radiology Studies: No results found.  Scheduled Meds:  apixaban  5 mg Oral BID   benazepril  20 mg Oral Daily   cholecalciferol  2,000 Units Oral Daily   divalproex  125 mg Oral Q8H   dorzolamide-timolol  1 drop Both Eyes BID   feeding supplement  237 mL Oral TID BM   latanoprost  1 drop Both Eyes QHS   levothyroxine  100 mcg Oral Q0600   megestrol  400 mg Oral Daily   metoprolol succinate  50 mg Oral Daily   pilocarpine  1 drop Both Eyes QID   thiamine  100 mg Oral Daily   Continuous Infusions:   LOS: 20 days   Hughie Closs, MD [facility]  09/10/2022, 9:52 AM   *Please note that this is a verbal dictation therefore any spelling or grammatical errors are due to the "Dragon [employer] One" system  interpretation.  Please page via Amion and do not message via secure chat for urgent patient care matters. Secure chat can be used for non urgent patient care matters.  How to contact the [facility] Attending or Consulting provider 7A - 7P or covering provider during after  hours 7P -7A, for this patient?  Check the care team in [facility] and look for a) attending/consulting TRH provider listed and b) the [facility] team listed. Page or secure chat 7A-7P. Log into [email or web address] and use [place]'s universal password to access. If you do not have the password, please contact the hospital operator. Locate the [facility] provider you are looking for under [facility] and page to a number that you can be directly reached. If you still have difficulty reaching the provider, please page the [facility] (Director on Call) for the Hospitalists listed on amion for assistance.

## 2022-09-10 NOTE — TOC Progression Note (Addendum)
Transition of Care [facility]) - Progression Note    Patient Details  Name: Amanda Cochran MRN: [id number] Date of Birth: 1928/04/18  [facility] CM/SW Contact  Beckie Busing, RN Phone [phone]  09/10/2022, 1:02 PM  Clinical Narrative:    CM at bedside with nephews Thayer Ohm and Onalee Hua at bedside. Cm discussed disposition planning with Thayer Ohm who patient has previously given permission to discuss care. CM updated Thayer Ohm that patient is now agreeable to [facility] but that there have been no beds available. CM asked Thayer Ohm if he is willing to sign for patient to go to nursing home if she remains agreeable. Thayer Ohm states that he still wants to speak with patient to determine what she wants. CM does not determine that Thayer Ohm (nephew) will sign. Nephew states that he will speak with patient but does not commit to signing patient into facility. Thayer Ohm has questions about financial responsibility and insurance coverage.   CM and nephews at bedside. Patient does not remember previous conversation about SNF placement. Patient is disoriented to place alert to self . Patient states that she just wants to go home. After much redirection patient does acknowledge that she would go to [facility]. This is witnessed by CM,  Thayer Ohm and Onalee Hua (nephews). CM made patient and nephews aware that CM would reach out to [facility] again to determine bed availability. Message has been sent to [facility] with admissions at [facility].    1412 Per [facility] admissions director at [facility] there is no bed availability until the end of next week.    Expected Discharge Plan: Home/Self Care Barriers to Discharge: ED Unsafe disposition  Expected Discharge Plan and Services   Discharge Planning Services: CM Consult   Living arrangements for the past 2 months: Single Family Home Expected Discharge Date: 08/29/22                                     Social Determinants of Health (SDOH)  Interventions SDOH Screenings   Food Insecurity: No Food Insecurity (08/21/2022)  Housing: Low Risk  (08/21/2022)  Transportation Needs: No Transportation Needs (08/21/2022)  Recent Concern: Transportation Needs - Unmet Transportation Needs (08/20/2022)  Utilities: Not At Risk (08/21/2022)  Tobacco Use: Low Risk  (08/20/2022)    Readmission Risk Interventions     No data to display

## 2022-09-10 NOTE — Progress Notes (Signed)
Physical Therapy Treatment Patient Details Name: Amanda Cochran MRN: [id number] DOB: 1928-03-07 Today's Date: 09/10/2022   History of Present Illness Pt is a 89yo female presenting to [facility] ED on 6/27 secondary AME with associated UTI, CT head negative for acute findings.   PMH: depression, hx of CVA (L basal ganglia infarct with R hemiparesis), Hld, HTN, hx of left eye lacunar stroke,    PT Comments  Patient very lifeless and required max support to sit up, did not make attempt to stand with max effort, slumps back  to mattress. Assisted back into bed. Appears to have inconsistencies where she will participate and times, not. Continue PT efforts.     Assistance Recommended at Discharge Frequent or constant Supervision/Assistance  If plan is discharge home, recommend the following:  Can travel by private vehicle    A little help with walking and/or transfers;A little help with bathing/dressing/bathroom;Assistance with cooking/housework;Direct supervision/assist for medications management;Direct supervision/assist for financial management;Help with stairs or ramp for entrance;Assist for transportation   No  Equipment Recommendations  None recommended by PT    Recommendations for Other Services       Precautions / Restrictions Precautions Precautions: Fall Precaution Comments: vision impairment, HOH Restrictions Weight Bearing Restrictions: No     Mobility  Bed Mobility   Bed Mobility: Supine to Sit, Sit to Supine Rolling: Max assist   Supine to sit: Min assist, HOB elevated, Max assist Sit to supine: Max assist   General bed mobility comments: patient very lifeless and required max support to move to sitting, patient not supporting balance in siting, falling posteriorly.    Transfers                   General transfer comment: attempted to stand, patient moaning and not  making effort to stand, assisted back into bed, lifting legs and supporting trunk.     Ambulation/Gait                   Stairs             Wheelchair Mobility     Tilt Bed    Modified Rankin (Stroke Patients Only)       Balance Overall balance assessment: Needs assistance Sitting-balance support: Feet supported Sitting balance-Leahy Scale: Zero Sitting balance - Comments: required support       Standing balance comment: unable                            Cognition Arousal/Alertness: Awake/alert Behavior During Therapy: Anxious, Restless Overall Cognitive Status: No family/caregiver present to determine baseline cognitive functioning Area of Impairment: Memory, Orientation, Safety/judgement, Awareness                       Following Commands: Follows one step commands inconsistently                Exercises      General Comments        Pertinent Vitals/Pain Pain Assessment Faces Pain Scale: Hurts whole lot Pain Location: feet Pain Descriptors / Indicators: Burning Pain Intervention(s): Limited activity within patient's tolerance    Home Living                          Prior Function            PT Goals (current goals can now be found in the care  plan section) Progress towards PT goals: Not progressing toward goals - comment    Frequency    Min 1X/week      PT Plan Current plan remains appropriate    Co-evaluation              AM-PAC PT "6 Clicks" Mobility   Outcome Measure  Help needed turning from your back to your side while in a flat bed without using bedrails?: Total Help needed moving from lying on your back to sitting on the side of a flat bed without using bedrails?: Total Help needed moving to and from a bed to a chair (including a wheelchair)?: Total Help needed standing up from a chair using your arms (e.g., wheelchair or bedside chair)?: Total Help needed to walk in hospital room?: Total Help needed climbing 3-5 steps with a railing? : Total 6 Click  Score: 6    End of Session   Activity Tolerance: Patient limited by pain;Patient limited by fatigue Patient left: in bed;with call bell/phone within reach;with bed alarm set Nurse Communication: Mobility status PT Visit Diagnosis: Unsteadiness on feet (R26.81);Difficulty in walking, not elsewhere classified (R26.2);Muscle weakness (generalized) (M62.81)     Time: 1610-9604 PT Time Calculation (min) (ACUTE ONLY): 22 min  Charges:    $Therapeutic Activity: 8-22 mins PT General Charges $$ ACUTE PT VISIT: 1 Visit                     Blanchard Kelch PT Acute Rehabilitation Services Office [phone] Weekend [phone]    Rada Hay 09/10/2022, 1:48 PM

## 2022-09-11 DIAGNOSIS — G9341 Metabolic encephalopathy: Secondary | ICD-10-CM | POA: Diagnosis not present

## 2022-09-11 LAB — BASIC METABOLIC PANEL
Anion gap: 9 (ref 5–15)
BUN: 22 mg/dL (ref 8–23)
CO2: 27 mmol/L (ref 22–32)
Calcium: 8.3 mg/dL — ABNORMAL LOW (ref 8.9–10.3)
Chloride: 105 mmol/L (ref 98–111)
Creatinine, Ser: 1.2 mg/dL — ABNORMAL HIGH (ref 0.44–1.00)
GFR, Estimated: 42 mL/min — ABNORMAL LOW (ref >60)
Glucose, Bld: 105 mg/dL — ABNORMAL HIGH (ref 70–99)
Potassium: 3.1 mmol/L — ABNORMAL LOW (ref 3.5–5.1)
Sodium: 141 mmol/L (ref 135–145)

## 2022-09-11 MED ORDER — POTASSIUM CHLORIDE CRYS ER 20 MEQ PO TBCR
40.0000 meq | EXTENDED_RELEASE_TABLET | ORAL | Status: AC
  Administered 2022-09-11 (×2): 40 meq via ORAL
  Filled 2022-09-11: qty 2

## 2022-09-11 MED ORDER — POTASSIUM CHLORIDE CRYS ER 20 MEQ PO TBCR
EXTENDED_RELEASE_TABLET | ORAL | Status: AC
  Filled 2022-09-11: qty 2

## 2022-09-11 NOTE — Progress Notes (Signed)
Occupational Therapy Treatment Patient Details Name: Amanda Cochran MRN: [id number] DOB: [birth year] Today's Date: 09/11/2022   History of present illness Pt is a 89 yr old female presenting to [facility] ED on 6/27 secondary AMS with associated UTI, CT head negative for acute findings.   PMH: depression, hx of CVA (L basal ganglia infarct with R hemiparesis), Hld, HTN, hx of left eye lacunar stroke,   OT comments  Pt was agreeable to participate in the session. She continues to report discomfort in her feet, which is worse with weight-bearing. She required assist for toileting at bathroom level, as well as assist for lower body dressing & grooming in standing at the sink. She required consistent directional cues during out of bed activity, given her chronic vision impairment. Continue OT plan of care. Patient will benefit from continued inpatient follow up therapy, <3 hours/day.    Recommendations for follow up therapy are one component of a multi-disciplinary discharge planning process, led by the attending physician.  Recommendations may be updated based on patient status, additional functional criteria and insurance authorization.    Assistance Recommended at Discharge Frequent or constant Supervision/Assistance  Patient can return home with the following  A little help with walking and/or transfers;A little help with bathing/dressing/bathroom;Assist for transportation;Assistance with cooking/housework;Direct supervision/assist for financial management;Direct supervision/assist for medications management   Equipment Recommendations  Other (comment) (defer to next level of care)    Recommendations for Other Services      Precautions / Restrictions Precautions Precautions: Fall Precaution Comments: vision impairment, HOH Restrictions Weight Bearing Restrictions: No       Mobility Bed Mobility Overal bed mobility: Needs Assistance Bed Mobility: Supine to Sit     Supine to sit: Min guard           Transfers Overall transfer level: Needs assistance Equipment used: Rolling walker (2 wheels) Transfers: Sit to/from Stand Sit to Stand: Min guard                     ADL either performed or assessed with clinical judgement   ADL Overall ADL's : Needs assistance/impaired Eating/Feeding: Set up;Sitting Eating/Feeding Details (indicate cue type and reason): She drank water from a cup seated in the bedside chair. Grooming: Min guard;Standing Grooming Details (indicate cue type and reason): She performed hand washing in standing at the sink, with verbal cues provided for item identification and location, given her baseline vision impairment.         Upper Body Dressing : Minimal assistance;Sitting   Lower Body Dressing: Moderate assistance Lower Body Dressing Details (indicate cue type and reason): She required increased assist to donn her shoes seated EOB. She stated "I can't reach down there." Toilet Transfer: Minimal assistance;Ambulation;Rolling walker (2 wheels);Grab bars Toilet Transfer Details (indicate cue type and reason): She required directional cues, given her vision impairment, including cues for grab bar location and RW placement. Toileting- Clothing Manipulation and Hygiene: Minimal assistance;Sit to/from stand Toileting - Clothing Manipulation Details (indicate cue type and reason): performed at bathroom level             Vision   Additional Comments: Pt reported being totally blind in one eye & near totally blind in the other eye          Cognition Arousal/Alertness: Awake/alert   Overall Cognitive Status: No family/caregiver present to determine baseline cognitive functioning Area of Impairment: Memory, Orientation  Memory: Decreased short-term memory         General Comments: Able to follow 1 step commands without difficulty                   Pertinent Vitals/ Pain       Pain Assessment Pain  Assessment: Faces Pain Score: 3  Pain Location: feet in standing Pain Intervention(s): Limited activity within patient's tolerance, Monitored during session         Frequency  Min 1X/week        Progress Toward Goals  OT Goals(current goals can now be found in the care plan section)  Progress towards OT goals: Progressing toward goals  Acute Rehab OT Goals Patient Stated Goal: to return home OT Goal Formulation: With patient Time For Goal Achievement: 09/19/22 Potential to Achieve Goals: Good  Plan Discharge plan remains appropriate       AM-PAC OT "6 Clicks" Daily Activity     Outcome Measure   Help from another person eating meals?: None Help from another person taking care of personal grooming?: A Little Help from another person toileting, which includes using toliet, bedpan, or urinal?: A Lot Help from another person bathing (including washing, rinsing, drying)?: A Lot Help from another person to put on and taking off regular upper body clothing?: A Little Help from another person to put on and taking off regular lower body clothing?: A Lot 6 Click Score: 16    End of Session Equipment Utilized During Treatment: Rolling walker (2 wheels);Gait belt  OT Visit Diagnosis: Low vision, both eyes (H54.2);Unsteadiness on feet (R26.81)   Activity Tolerance Patient tolerated treatment well   Patient Left in chair;with call bell/phone within reach;with chair alarm set   Nurse Communication Mobility status        Time: 0865-7846 OT Time Calculation (min): 22 min  Charges: OT General Charges $OT Visit: 1 Visit OT Treatments $Self Care/Home Management : 8-22 mins     Reuben Likes, OTR/L 09/11/2022, 3:41 PM

## 2022-09-11 NOTE — Plan of Care (Signed)

## 2022-09-11 NOTE — TOC Progression Note (Signed)
Transition of Care [facility]) - Progression Note    Patient Details  Name: Amanda Cochran MRN: [id number] Date of Birth: 16-Jan-1929  Transition of Care [facility]) CM/SW Contact  Armanda Heritage, RN Phone Number: 09/11/2022, 12:31 PM  Clinical Narrative:     CM had a long follow-up discussion with nephew Thayer Ohm regarding discharge planning and patient's medical stability for discharge.  Went over patient's intermittent confusion episodes and explained why hospital team is asking for family support in selecting facility and signing patient in.  Thayer Ohm did share that he is HCPOA for patient and documents were completed in 2016 with patient's attorney.  This CM requested that Thayer Ohm provide copy if able.  CM also followed up with [facility] rep French Ana who reports that facility will NOT extend a bed offer to patient as they believe her care needs exceed facility capability.  I provided this information to [place] along with all available bed offers, their locations, and information on accessing Medicare ratings.  Thayer Ohm reports he is interested in speaking with provider about some of the medications patient is receiving and this information was relayed to MD with request to contact Thayer Ohm.    Additionally, Thayer Ohm and I discussed patients long term goals and desire to remain independent and how short term rehab can assist towards this.  Thayer Ohm has agreed that he will review bed offers and select a facility.  This CM will follow up with Thayer Ohm this afternoon for choice.   Expected Discharge Plan: Home/Self Care Barriers to Discharge: ED Unsafe disposition  Expected Discharge Plan and Services   Discharge Planning Services: CM Consult   Living arrangements for the past 2 months: Single Family Home Expected Discharge Date: 08/29/22                                     Social Determinants of Health (SDOH) Interventions SDOH Screenings   Food Insecurity: No Food Insecurity (08/21/2022)  Housing: Low Risk   (08/21/2022)  Transportation Needs: No Transportation Needs (08/21/2022)  Recent Concern: Transportation Needs - Unmet Transportation Needs (08/20/2022)  Utilities: Not At Risk (08/21/2022)  Tobacco Use: Low Risk  (08/20/2022)    Readmission Risk Interventions     No data to display

## 2022-09-11 NOTE — Progress Notes (Signed)
PROGRESS NOTE    Amanda Cochran  ZOX:[id number] DOB: [birth year] DOA: 08/19/2022 PCP: [name], MD   Brief Narrative:  89 y.o. female with medical history significant of open-angle glaucoma of both eyes with legal blindness, CVA with some residual right-sided weakness (uses a cane to ambulate), hyperlipidemia, hypertension, atrial fibrillation on Eliquis, hypothyroidism who was brought to the emergency department after she was found screaming in her house at "intruders."     Patient was felt to be suffering from acute metabolic encephalopathy secondary to possible urinary tract infection.  The hospitalist group was called to assess the patient for admission to the hospital.     Patient was placed on intravenous antibiotics with ceftriaxone.  During this hospitalization, echocardiogram revealed markedly low ejection fraction of 30 to 35% with indeterminate diastolic function and.  Patient did not appear to be in acute heart failure.  Case was discussed with cardiology and home regimen of beta-blocker therapy was titrated.   Doxycycline was added to the ceftriaxone later in the hospitalization due to additional concerns for possible pneumonia.   Over the course of the hospitalization the patient has exhibited bouts of agitation and delusions.  Psychiatry was consulted and felt the patient was likely suffering from residual delirium from her underlying illness and did not believe that the patient was suffering an underlying psychiatric condition.   Patient seen and examined she is teary and tells me that she cannot do much by herself. She wants to know if she can go home. We talked about safety issues, in view of her vision impairment and that she needs 24 hour care. Unfortunately her brother is not a position to help her, her nephew and niece work and unable to provide 24 hour care. Discussed safe disposition with her brother  and nephew, waiting for family to get back .     Assessment & Plan:    Principal Problem:   Acute metabolic encephalopathy Active Problems:   E-coli UTI   Pneumonia of left lower lobe due to infectious organism   Paranoid delusion (HCC)   Chronic systolic CHF (congestive heart failure) (HCC)   Acute renal failure superimposed on stage 3a chronic kidney disease (HCC)   Atrial fibrillation, chronic (HCC)   Essential hypertension   Hypothyroidism   Hyperlipidemia   Right hemiparesis (HCC)   Left sided lacunar stroke (HCC)   History of CVA (cerebrovascular accident)   MCI (mild cognitive impairment)   Primary open angle glaucoma of both eyes, severe stage   Acute HFrEF (heart failure with reduced ejection fraction) (HCC)   Osteoarthritis  Acute encephalopathy probably secondary to paranoid delusions, in the setting of decline in cognitive status with some progressive confusion and dementia.  Please see detailed note from [name] who has spoken to the patient's brother and the [facility] regarding her mental status.  She was seen by psychiatry recommended to start the patient on Depakote. Work up has been negative so far for infection.  MRI brain  shows Moderate to severe chronic small-vessel disease with scattered areas of old infarction throughout the brain. Psychiatry consulted for capacity evaluation and recommendations given.  I do not think she is safe to go home by herself unless she had 24 hour care at home. She is high fall risk and the next fall might be fatal especially with her vision impairment and her being on eliquis.  She would benefit from SNF placement. Currently waiting for safe disposition and family to make a decision regarding her  disposition.    Vision loss in the left eye.  Chronic blindness in the right eye.  Suspect progression of glaucoma as per [name] with Ophthalmology.  Recommended outpatient follow up with [name].    E coli UTI and possible atypical pneumonia:  Completed the course of antibiotics.   AKI on stage 3a  CKD Creatinine back to baseline.    Chronic systolic CHF: LAST EF is 30 to 35% with indeterminate diastolic dysfunction.  Continue with eliquis, benazepril 20 mg daily and metoprolol 50 mg daily.  She appears euvolemic.   Hypothyroidism:  Continue synthroid 100 mcg daily.    H/o CVA;  Reviewed the MRI brain without contrast.  Legally blind.    Poor Oral intake:  - eating less than 25% of her meals.  -- added megace to help with her appetite.   Hypokalemia: replace.    DVT prophylaxis: SCDs Start: 08/20/22 0644   Code Status: Full Code  Family Communication:  None present at bedside.    Status is: Inpatient Remains inpatient appropriate because: Patient medically stable, pending placement to SNF.   Estimated body mass index is 21.9 kg/m as calculated from the following:   Height as of this encounter: 5\' 5"  (1.651 m).   Weight as of this encounter: 59.7 kg.    Nutritional Assessment: Body mass index is 21.9 kg/m.Marland Kitchen Seen by dietician.  I agree with the assessment and plan as outlined below: Nutrition Status:        . Skin Assessment: I have examined the patient's skin and I agree with the wound assessment as performed by the wound care RN as outlined below:    Consultants:  Psychiatry-Signed off  Procedures:  Plan  Antimicrobials:  Anti-infectives (From admission, onward)    Start     Dose/Rate Route Frequency Ordered Stop   08/24/22 2200  amoxicillin-clavulanate (AUGMENTIN) 500-125 MG per tablet 1 tablet        1 tablet Oral 2 times daily 08/24/22 1427 08/26/22 2228   08/24/22 1000  amoxicillin-clavulanate (AUGMENTIN) 875-125 MG per tablet 1 tablet  Status:  Discontinued        1 tablet Oral Every 12 hours 08/23/22 1255 08/24/22 1427   08/23/22 1345  doxycycline (VIBRA-TABS) tablet 100 mg  Status:  Discontinued        100 mg Oral Every 12 hours 08/23/22 1255 08/27/22 1425   08/21/22 0800  cefTRIAXone (ROCEPHIN) 1 g in sodium chloride 0.9 % 100 mL IVPB   Status:  Discontinued        1 g 200 mL/hr over 30 Minutes Intravenous Every 24 hours 08/20/22 0644 08/23/22 1255   08/20/22 0600  cefTRIAXone (ROCEPHIN) 1 g in sodium chloride 0.9 % 100 mL IVPB        1 g 200 mL/hr over 30 Minutes Intravenous  Once 08/20/22 0557 08/20/22 8295         Subjective: Patient seen and examined.  No complaints.  Objective: Vitals:   09/10/22 0520 09/10/22 1341 09/10/22 2127 09/11/22 0536  BP: (!) 168/97 (!) 158/87 139/65 136/81  Pulse: 63 65  (!) 58  Resp: 14 16 14 14   Temp: 97.6 F (36.4 C) (!) 97.4 F (36.3 C) (!) 97.4 F (36.3 C) 98 F (36.7 C)  TempSrc: Oral Axillary Oral Oral  SpO2: 96% 94% 96% 96%  Weight: 59.7 kg   59.7 kg  Height:        Intake/Output Summary (Last 24 hours) at 09/11/2022 1026 Last data  filed at 09/11/2022 0900 Gross per 24 hour  Intake 237 ml  Output --  Net 237 ml   Filed Weights   09/10/22 0500 09/10/22 0520 09/11/22 0536  Weight: 61.6 kg 59.7 kg 59.7 kg    Examination: General exam: Appears calm and comfortable  Respiratory system: Clear to auscultation. Respiratory effort normal. Cardiovascular system: S1 & S2 heard, RRR. No JVD, murmurs, rubs, gallops or clicks. No pedal edema. Gastrointestinal system: Abdomen is nondistended, soft and nontender. No organomegaly or masses felt. Normal bowel sounds heard. Central nervous system: Alert but likely not oriented.  Data Reviewed: I have personally reviewed following labs and imaging studies  CBC: Recent Labs  Lab 09/05/22 0543  WBC 7.9  NEUTROABS 4.9  HGB 13.7  HCT 44.6  MCV 94.1  PLT 158   Basic Metabolic Panel: Recent Labs  Lab 09/05/22 0543 09/07/22 1532 09/11/22 0837  NA 142 140 141  K 3.4* 3.7 3.1*  CL 105 108 105  CO2 28 22 27   GLUCOSE 91 75 105*  BUN 26* 21 22  CREATININE 0.97 1.05* 1.20*  CALCIUM 8.4* 8.3* 8.3*   GFR: Estimated Creatinine Clearance: 26.9 mL/min (A) (by C-G formula based on SCr of 1.2 mg/dL (H)). Liver Function  Tests: No results for input(s): "AST", "ALT", "ALKPHOS", "BILITOT", "PROT", "ALBUMIN" in the last 168 hours.  No results for input(s): "LIPASE", "AMYLASE" in the last 168 hours. No results for input(s): "AMMONIA" in the last 168 hours.  Coagulation Profile: No results for input(s): "INR", "PROTIME" in the last 168 hours. Cardiac Enzymes: No results for input(s): "CKTOTAL", "CKMB", "CKMBINDEX", "TROPONINI" in the last 168 hours. BNP (last 3 results) No results for input(s): "PROBNP" in the last 8760 hours. HbA1C: No results for input(s): "HGBA1C" in the last 72 hours. CBG: No results for input(s): "GLUCAP" in the last 168 hours. Lipid Profile: No results for input(s): "CHOL", "HDL", "LDLCALC", "TRIG", "CHOLHDL", "LDLDIRECT" in the last 72 hours. Thyroid Function Tests: No results for input(s): "TSH", "T4TOTAL", "FREET4", "T3FREE", "THYROIDAB" in the last 72 hours. Anemia Panel: No results for input(s): "VITAMINB12", "FOLATE", "FERRITIN", "TIBC", "IRON", "RETICCTPCT" in the last 72 hours. Sepsis Labs: No results for input(s): "PROCALCITON", "LATICACIDVEN" in the last 168 hours.  No results found for this or any previous visit (from the past 240 hour(s)).   Radiology Studies: No results found.  Scheduled Meds:  apixaban  5 mg Oral BID   benazepril  20 mg Oral Daily   cholecalciferol  2,000 Units Oral Daily   divalproex  125 mg Oral Q8H   dorzolamide-timolol  1 drop Both Eyes BID   feeding supplement  237 mL Oral TID BM   latanoprost  1 drop Both Eyes QHS   levothyroxine  100 mcg Oral Q0600   megestrol  400 mg Oral Daily   metoprolol succinate  50 mg Oral Daily   pilocarpine  1 drop Both Eyes QID   potassium chloride  40 mEq Oral Q4H   thiamine  100 mg Oral Daily   Continuous Infusions:   LOS: 21 days   Hughie Closs, MD [facility]  09/11/2022, 10:26 AM   *Please note that this is a verbal dictation therefore any spelling or grammatical errors are due to the  "Dragon [employer] One" system interpretation.  Please page via Amion and do not message via secure chat for urgent patient care matters. Secure chat can be used for non urgent patient care matters.  How to contact the [facility] Attending or Consulting  provider 7A - 7P or covering provider during after hours 7P -7A, for this patient?  Check the care team in [facility] and look for a) attending/consulting TRH provider listed and b) the [facility] team listed. Page or secure chat 7A-7P. Log into [email or web address] and use [facility] universal password to access. If you do not have the password, please contact the hospital operator. Locate the [facility] provider you are looking for under [facility] and page to a number that you can be directly reached. If you still have difficulty reaching the provider, please page the [facility] (Director on Call) for the Hospitalists listed on amion for assistance.

## 2022-09-11 NOTE — Plan of Care (Signed)
  Problem: Clinical Measurements: Goal: Will remain free from infection Outcome: Progressing   Problem: Activity: Goal: Risk for activity intolerance will decrease Outcome: Progressing   Problem: Nutrition: Goal: Adequate nutrition will be maintained Outcome: Progressing   Problem: Elimination: Goal: Will not experience complications related to bowel motility Outcome: Progressing   Problem: Elimination: Goal: Will not experience complications related to urinary retention Outcome: Progressing   Problem: Safety: Goal: Ability to remain free from injury will improve Outcome: Progressing   Problem: Skin Integrity: Goal: Risk for impaired skin integrity will decrease Outcome: Progressing

## 2022-09-12 DIAGNOSIS — G9341 Metabolic encephalopathy: Secondary | ICD-10-CM | POA: Diagnosis not present

## 2022-09-12 LAB — BASIC METABOLIC PANEL
Anion gap: 7 (ref 5–15)
BUN: 24 mg/dL — ABNORMAL HIGH (ref 8–23)
CO2: 27 mmol/L (ref 22–32)
Calcium: 8.5 mg/dL — ABNORMAL LOW (ref 8.9–10.3)
Chloride: 108 mmol/L (ref 98–111)
Creatinine, Ser: 1.25 mg/dL — ABNORMAL HIGH (ref 0.44–1.00)
GFR, Estimated: 40 mL/min — ABNORMAL LOW (ref >60)
Glucose, Bld: 96 mg/dL (ref 70–99)
Potassium: 4.4 mmol/L (ref 3.5–5.1)
Sodium: 142 mmol/L (ref 135–145)

## 2022-09-12 NOTE — Plan of Care (Signed)
  Problem: Clinical Measurements: Goal: Will remain free from infection Outcome: Progressing   Problem: Activity: Goal: Risk for activity intolerance will decrease Outcome: Progressing   Problem: Nutrition: Goal: Adequate nutrition will be maintained Outcome: Progressing   Problem: Safety: Goal: Ability to remain free from injury will improve Outcome: Progressing   Problem: Elimination: Goal: Will not experience complications related to urinary retention Outcome: Progressing   Problem: Skin Integrity: Goal: Risk for impaired skin integrity will decrease Outcome: Progressing   Problem: Safety: Goal: Ability to remain free from injury will improve Outcome: Progressing

## 2022-09-12 NOTE — TOC Progression Note (Addendum)
Transition of Care [facility]) - Progression Note    Patient Details  Name: Amanda Cochran MRN: [id number] Date of Birth: 03-Oct-1928  Transition of Care [facility]) CM/SW Contact  Princella Ion, LCSW Phone Number: 09/12/2022, 11:24 AM  Clinical Narrative:    This CSW attempted to outreach to pt's nephew/HCPOA without success. Left HIPAA Compliant vm requesting call back.   Addend @ 4:10 PM Attempted to contact pt's nephew/HCPOA again without success. Left HIPAA Compliant vm requesting call back.    Expected Discharge Plan: Home/Self Care Barriers to Discharge: ED Unsafe disposition  Expected Discharge Plan and Services   Discharge Planning Services: CM Consult   Living arrangements for the past 2 months: Single Family Home Expected Discharge Date: 08/29/22                                     Social Determinants of Health (SDOH) Interventions SDOH Screenings   Food Insecurity: No Food Insecurity (08/21/2022)  Housing: Low Risk  (08/21/2022)  Transportation Needs: No Transportation Needs (08/21/2022)  Recent Concern: Transportation Needs - Unmet Transportation Needs (08/20/2022)  Utilities: Not At Risk (08/21/2022)  Tobacco Use: Low Risk  (08/20/2022)    Readmission Risk Interventions     No data to display

## 2022-09-12 NOTE — Plan of Care (Signed)

## 2022-09-12 NOTE — Progress Notes (Signed)
PROGRESS NOTE    Amanda Cochran  WGN:[id number] DOB: 1928/04/06 DOA: 08/19/2022 PCP: [name], MD   Brief Narrative:  89 y.o. female with medical history significant of open-angle glaucoma of both eyes with legal blindness, CVA with some residual right-sided weakness (uses a cane to ambulate), hyperlipidemia, hypertension, atrial fibrillation on Eliquis, hypothyroidism who was brought to the emergency department after she was found screaming in her house at "intruders."     Patient was felt to be suffering from acute metabolic encephalopathy secondary to possible urinary tract infection.  The hospitalist group was called to assess the patient for admission to the hospital.     Patient was placed on intravenous antibiotics with ceftriaxone.  During this hospitalization, echocardiogram revealed markedly low ejection fraction of 30 to 35% with indeterminate diastolic function and.  Patient did not appear to be in acute heart failure.  Case was discussed with cardiology and home regimen of beta-blocker therapy was titrated.   Doxycycline was added to the ceftriaxone later in the hospitalization due to additional concerns for possible pneumonia.   Over the course of the hospitalization the patient has exhibited bouts of agitation and delusions.  Psychiatry was consulted and felt the patient was likely suffering from residual delirium from her underlying illness and did not believe that the patient was suffering an underlying psychiatric condition.   Patient seen and examined she is teary and tells me that she cannot do much by herself. She wants to know if she can go home. We talked about safety issues, in view of her vision impairment and that she needs 24 hour care. Unfortunately her brother is not a position to help her, her nephew and niece work and unable to provide 24 hour care. Discussed safe disposition with her brother  and nephew, waiting for family to get back .     Assessment & Plan:    Principal Problem:   Acute metabolic encephalopathy Active Problems:   E-coli UTI   Pneumonia of left lower lobe due to infectious organism   Paranoid delusion (HCC)   Chronic systolic CHF (congestive heart failure) (HCC)   Acute renal failure superimposed on stage 3a chronic kidney disease (HCC)   Atrial fibrillation, chronic (HCC)   Essential hypertension   Hypothyroidism   Hyperlipidemia   Right hemiparesis (HCC)   Left sided lacunar stroke (HCC)   History of CVA (cerebrovascular accident)   MCI (mild cognitive impairment)   Primary open angle glaucoma of both eyes, severe stage   Acute HFrEF (heart failure with reduced ejection fraction) (HCC)   Osteoarthritis  Acute encephalopathy probably secondary to paranoid delusions, in the setting of decline in cognitive status with some progressive confusion and dementia.  Please see detailed note from [name] who has spoken to the patient's brother and the [facility] regarding her mental status.  She was seen by psychiatry recommended to start the patient on Depakote. Work up has been negative so far for infection.  MRI brain  shows Moderate to severe chronic small-vessel disease with scattered areas of old infarction throughout the brain. Psychiatry consulted for capacity evaluation and recommendations given.  I do not think she is safe to go home by herself unless she had 24 hour care at home. She is high fall risk and the next fall might be fatal especially with her vision impairment and her being on eliquis.  She would benefit from SNF placement. Currently waiting for safe disposition and family to make a decision regarding her  disposition.    Vision loss in the left eye.  Chronic blindness in the right eye.  Suspect progression of glaucoma as per [name] with Ophthalmology.  Recommended outpatient follow up with [name].    E coli UTI and possible atypical pneumonia:  Completed the course of antibiotics.   AKI on stage 3a  CKD Creatinine back to baseline.    Chronic systolic CHF: LAST EF is 30 to 35% with indeterminate diastolic dysfunction.  Continue with eliquis, benazepril 20 mg daily and metoprolol 50 mg daily.  She appears euvolemic.   Hypothyroidism:  Continue synthroid 100 mcg daily.    H/o CVA;  Reviewed the MRI brain without contrast.  Legally blind.    Poor Oral intake:  - eating less than 25% of her meals.  -- added megace to help with her appetite.   Hypokalemia: Resolved   DVT prophylaxis: SCDs Start: 08/20/22 0644   Code Status: Full Code  Family Communication:  None present at bedside.  Discussed in length with patient's nephew Thayer Ohm.  Status is: Inpatient Remains inpatient appropriate because: Patient medically stable, pending placement to SNF.   Estimated body mass index is 22.67 kg/m as calculated from the following:   Height as of this encounter: 5\' 5"  (1.651 m).   Weight as of this encounter: 61.8 kg.    Nutritional Assessment: Body mass index is 22.67 kg/m.Marland Kitchen Seen by dietician.  I agree with the assessment and plan as outlined below: Nutrition Status:        . Skin Assessment: I have examined the patient's skin and I agree with the wound assessment as performed by the wound care RN as outlined below:    Consultants:  Psychiatry-Signed off  Procedures:  Plan  Antimicrobials:  Anti-infectives (From admission, onward)    Start     Dose/Rate Route Frequency Ordered Stop   08/24/22 2200  amoxicillin-clavulanate (AUGMENTIN) 500-125 MG per tablet 1 tablet        1 tablet Oral 2 times daily 08/24/22 1427 08/26/22 2228   08/24/22 1000  amoxicillin-clavulanate (AUGMENTIN) 875-125 MG per tablet 1 tablet  Status:  Discontinued        1 tablet Oral Every 12 hours 08/23/22 1255 08/24/22 1427   08/23/22 1345  doxycycline (VIBRA-TABS) tablet 100 mg  Status:  Discontinued        100 mg Oral Every 12 hours 08/23/22 1255 08/27/22 1425   08/21/22 0800  cefTRIAXone  (ROCEPHIN) 1 g in sodium chloride 0.9 % 100 mL IVPB  Status:  Discontinued        1 g 200 mL/hr over 30 Minutes Intravenous Every 24 hours 08/20/22 0644 08/23/22 1255   08/20/22 0600  cefTRIAXone (ROCEPHIN) 1 g in sodium chloride 0.9 % 100 mL IVPB        1 g 200 mL/hr over 30 Minutes Intravenous  Once 08/20/22 0557 08/20/22 1610         Subjective: Patient seen and examined.  She has no complaints.  She is improving.  Had a lengthy discussion with patient's nephew Thayer Ohm this morning as I was asked to do so by [facility] yesterday.  He had no medical questions for me.  I have relayed his concerns about placement to TOC.  Objective: Vitals:   09/11/22 0536 09/11/22 1339 09/11/22 2057 09/12/22 0453  BP: 136/81 (!) 142/91 124/76 (!) 141/73  Pulse: (!) 58 72 66 (!) 56  Resp: 14 18 14 14   Temp: 98 F (36.7 C) 98.5 F (  36.9 C) 97.6 F (36.4 C) 97.7 F (36.5 C)  TempSrc: Oral  Oral Oral  SpO2: 96% 97% 97% 90%  Weight: 59.7 kg   61.8 kg  Height:        Intake/Output Summary (Last 24 hours) at 09/12/2022 1118 Last data filed at 09/12/2022 1000 Gross per 24 hour  Intake 720 ml  Output --  Net 720 ml   Filed Weights   09/10/22 0520 09/11/22 0536 09/12/22 0453  Weight: 59.7 kg 59.7 kg 61.8 kg    Examination:  General exam: Appears calm and comfortable  Respiratory system: Clear to auscultation. Respiratory effort normal. Cardiovascular system: S1 & S2 heard, RRR. No JVD, murmurs, rubs, gallops or clicks. No pedal edema. Gastrointestinal system: Abdomen is nondistended, soft and nontender. No organomegaly or masses felt. Normal bowel sounds heard. Central nervous system: Alert and oriented x 2. No focal neurological deficits. Extremities: Symmetric 5 x 5 power. Skin: No rashes, lesions or ulcers.  Psychiatry: Judgement and insight appear poor  Data Reviewed: I have personally reviewed following labs and imaging studies  CBC: No results for input(s): "WBC", "NEUTROABS", "HGB", "HCT",  "MCV", "PLT" in the last 168 hours.  Basic Metabolic Panel: Recent Labs  Lab 09/07/22 1532 09/11/22 0837 09/12/22 0550  NA 140 141 142  K 3.7 3.1* 4.4  CL 108 105 108  CO2 22 27 27   GLUCOSE 75 105* 96  BUN 21 22 24*  CREATININE 1.05* 1.20* 1.25*  CALCIUM 8.3* 8.3* 8.5*   GFR: Estimated Creatinine Clearance: 25.8 mL/min (A) (by C-G formula based on SCr of 1.25 mg/dL (H)). Liver Function Tests: No results for input(s): "AST", "ALT", "ALKPHOS", "BILITOT", "PROT", "ALBUMIN" in the last 168 hours.  No results for input(s): "LIPASE", "AMYLASE" in the last 168 hours. No results for input(s): "AMMONIA" in the last 168 hours.  Coagulation Profile: No results for input(s): "INR", "PROTIME" in the last 168 hours. Cardiac Enzymes: No results for input(s): "CKTOTAL", "CKMB", "CKMBINDEX", "TROPONINI" in the last 168 hours. BNP (last 3 results) No results for input(s): "PROBNP" in the last 8760 hours. HbA1C: No results for input(s): "HGBA1C" in the last 72 hours. CBG: No results for input(s): "GLUCAP" in the last 168 hours. Lipid Profile: No results for input(s): "CHOL", "HDL", "LDLCALC", "TRIG", "CHOLHDL", "LDLDIRECT" in the last 72 hours. Thyroid Function Tests: No results for input(s): "TSH", "T4TOTAL", "FREET4", "T3FREE", "THYROIDAB" in the last 72 hours. Anemia Panel: No results for input(s): "VITAMINB12", "FOLATE", "FERRITIN", "TIBC", "IRON", "RETICCTPCT" in the last 72 hours. Sepsis Labs: No results for input(s): "PROCALCITON", "LATICACIDVEN" in the last 168 hours.  No results found for this or any previous visit (from the past 240 hour(s)).   Radiology Studies: No results found.  Scheduled Meds:  apixaban  5 mg Oral BID   benazepril  20 mg Oral Daily   cholecalciferol  2,000 Units Oral Daily   divalproex  125 mg Oral Q8H   dorzolamide-timolol  1 drop Both Eyes BID   feeding supplement  237 mL Oral TID BM   latanoprost  1 drop Both Eyes QHS   levothyroxine  100 mcg  Oral Q0600   megestrol  400 mg Oral Daily   metoprolol succinate  50 mg Oral Daily   pilocarpine  1 drop Both Eyes QID   thiamine  100 mg Oral Daily   Continuous Infusions:   LOS: 22 days   Hughie Closs, MD [facility]  09/12/2022, 11:18 AM   *Please note that this is a  verbal dictation therefore any spelling or grammatical errors are due to the "Dragon Medical One" system interpretation.  Please page via Amion and do not message via secure chat for urgent patient care matters. Secure chat can be used for non urgent patient care matters.  How to contact the [facility] Attending or Consulting provider 7A - 7P or covering provider during after hours 7P -7A, for this patient?  Check the care team in [facility] and look for a) attending/consulting TRH provider listed and b) the [facility] team listed. Page or secure chat 7A-7P. Log into [email or web address] and use [place]'s universal password to access. If you do not have the password, please contact the hospital operator. Locate the [facility] provider you are looking for under [facility] and page to a number that you can be directly reached. If you still have difficulty reaching the provider, please page the [facility] (Director on Call) for the Hospitalists listed on amion for assistance.

## 2022-09-13 DIAGNOSIS — G9341 Metabolic encephalopathy: Secondary | ICD-10-CM | POA: Diagnosis not present

## 2022-09-13 NOTE — TOC Progression Note (Addendum)
Transition of Care [facility]) - Progression Note    Patient Details  Name: Amanda Cochran MRN: [id number] Date of Birth: Mar 13, 1928  Transition [facility]) CM/SW Contact  Adrian Prows, RN Phone Number: 09/13/2022, 1:24 PM  Clinical Narrative:    Attempted to contact pt's nephew to discuss bed choice; LVM x 2 at [phone]; awaiting return call.  [phone]- call back from pt's nephew [name]; he has selected [facility] for pt; will pass on to upcoming TOC.  Expected Discharge Plan: Home/Self Care Barriers to Discharge: ED Unsafe disposition  Expected Discharge Plan and Services   Discharge Planning Services: CM Consult   Living arrangements for the past 2 months: Single Family Home Expected Discharge Date: 08/29/22                                     Social Determinants of Health (SDOH) Interventions SDOH Screenings   Food Insecurity: No Food Insecurity (08/21/2022)  Housing: Low Risk  (08/21/2022)  Transportation Needs: No Transportation Needs (08/21/2022)  Recent Concern: Transportation Needs - Unmet Transportation Needs (08/20/2022)  Utilities: Not At Risk (08/21/2022)  Tobacco Use: Low Risk  (08/20/2022)    Readmission Risk Interventions     No data to display

## 2022-09-13 NOTE — Plan of Care (Signed)

## 2022-09-13 NOTE — TOC Progression Note (Addendum)
Transition of Care [facility]) - Progression Note    Patient Details  Name: Amanda Cochran MRN: [id number] Date of Birth: [birth year]  [facility]) CM/SW Contact  Princella Ion, LCSW Phone Number: 09/13/2022, 9:46 AM  Clinical Narrative:    This CSW received a call back from pt's nephew, Thayer Ohm [facility]). Thayer Ohm inquired about length of STR stay and this CSW informed that the hospital is unaware of how long the pt will be at the facility. It was explained that PT will see pt at SNF and develop a treatment plan and pt's stay depends on progress. Pt's nephew asked about which SNFs this CSW could provide "an opinion" of and this CSW informed that we do not make recommendations; however, encourage pt and families to visit [email or web address] Thayer Ohm reports he's viewed some SNFs and "some are really bad and some are okay." Thayer Ohm inquired about [facility] and this CSW did reiterate what he'd spoke to [place] about regarding [facility] decision to decline pt due to several factors (unable to meet care needs, psychosocial needs, bed availability). Thayer Ohm informed that he'd been told by providers that pt will not be able to live alone again. This CSW informed Thayer Ohm that if he is interested in LTC at any point, he will need to visit DSS to meet with a Medicaid Eligibility case worker and apply for Medicaid on pt's behalf. This CSW also informed that the SNF's social worker should be able to assist in transitioning pt to LTC if that is something he wishes for.  This CSW notified Thayer Ohm that if he is wanting LTC at some point, some SNFs allow pt's with Medicaid Pending. This CSW did inform if pt does not qualify for Medicaid, py would be required to private pay or he may need to look into bringing pt to his home. Thayer Ohm did inform pt does not have funds to private pay. This CSW clarified for Thayer Ohm that Medicaid Pending means someone is awaiting approval of a submitted application. Thayer Ohm asked if pt is "allowed to not use  Medicaid if she gets it." This CSW informed Medicaid is insurance and will likely be used if pt receives or requests certain medical services. Thayer Ohm inquired about whether pt has to reapply each year and this CSW did inform there is an annual re-certification process that involves the Medicaid Caseworker taking an in depth look to see if pt still meets criteria. Thayer Ohm verbalized understanding.  Thayer Ohm also inquired about potentially having pt moved closer to him and this CSW informed that once placed, he will need to work with social work at [employer] to transition pt to another facility closer to him if the distance remains a challenge. Thayer Ohm is also aware that facility will assist in transitioning pt to a different facility if they are dissatisfied with where she is.   Thayer Ohm inquired about Medicare days and this CSW informed that Medicare allows 100 days; however, days 1-20 are covered at 100% with days 21-100 requiring a copay (typically $200/day) that is the pt's/families responsibility. Thayer Ohm informed pt doesn't have money to private pay and this CSW informed that pt may be able to discharge from SNF with [facility] services coordinated by rehab social worker.   Thayer Ohm inquired about pt receiving PT services while in the hospital and this CSW informed that PT recommended SNF meaning pt is needing skilled and frequent PT visits; therefore, encouraged Thayer Ohm to select a facility so that pt may be transitioned to the level of care needed  sooner than later as PT will not see pt daily while in the hospital. Thayer Ohm verbalized understanding of the above information and states he will look at a couple more facilities.This CSW notified current RNCM and requested she outreach to pt in a few hours. TOC following.    Expected Discharge Plan: Home/Self Care Barriers to Discharge: ED Unsafe disposition  Expected Discharge Plan and Services   Discharge Planning Services: CM Consult   Living arrangements for the past 2 months:  Single Family Home Expected Discharge Date: 08/29/22                                     Social Determinants of Health (SDOH) Interventions SDOH Screenings   Food Insecurity: No Food Insecurity (08/21/2022)  Housing: Low Risk  (08/21/2022)  Transportation Needs: No Transportation Needs (08/21/2022)  Recent Concern: Transportation Needs - Unmet Transportation Needs (08/20/2022)  Utilities: Not At Risk (08/21/2022)  Tobacco Use: Low Risk  (08/20/2022)    Readmission Risk Interventions     No data to display

## 2022-09-13 NOTE — Progress Notes (Signed)
PROGRESS NOTE    Amanda Cochran  ZOX:[id number] DOB: 1928/06/18 DOA: 08/19/2022 PCP: [name], MD   Brief Narrative:  89 y.o. female with medical history significant of open-angle glaucoma of both eyes with legal blindness, CVA with some residual right-sided weakness (uses a cane to ambulate), hyperlipidemia, hypertension, atrial fibrillation on Eliquis, hypothyroidism who was brought to the emergency department after she was found screaming in her house at "intruders."     Patient was felt to be suffering from acute metabolic encephalopathy secondary to possible urinary tract infection.  The hospitalist group was called to assess the patient for admission to the hospital.     Patient was placed on intravenous antibiotics with ceftriaxone.  During this hospitalization, echocardiogram revealed markedly low ejection fraction of 30 to 35% with indeterminate diastolic function and.  Patient did not appear to be in acute heart failure.  Case was discussed with cardiology and home regimen of beta-blocker therapy was titrated.   Doxycycline was added to the ceftriaxone later in the hospitalization due to additional concerns for possible pneumonia.   Over the course of the hospitalization the patient has exhibited bouts of agitation and delusions.  Psychiatry was consulted and felt the patient was likely suffering from residual delirium from her underlying illness and did not believe that the patient was suffering an underlying psychiatric condition.   Patient seen and examined she is teary and tells me that she cannot do much by herself. She wants to know if she can go home. We talked about safety issues, in view of her vision impairment and that she needs 24 hour care. Unfortunately her brother is not a position to help her, her nephew and niece work and unable to provide 24 hour care. Discussed safe disposition with her brother  and nephew, waiting for family to get back .     Assessment & Plan:    Principal Problem:   Acute metabolic encephalopathy Active Problems:   E-coli UTI   Pneumonia of left lower lobe due to infectious organism   Paranoid delusion (HCC)   Chronic systolic CHF (congestive heart failure) (HCC)   Acute renal failure superimposed on stage 3a chronic kidney disease (HCC)   Atrial fibrillation, chronic (HCC)   Essential hypertension   Hypothyroidism   Hyperlipidemia   Right hemiparesis (HCC)   Left sided lacunar stroke (HCC)   History of CVA (cerebrovascular accident)   MCI (mild cognitive impairment)   Primary open angle glaucoma of both eyes, severe stage   Acute HFrEF (heart failure with reduced ejection fraction) (HCC)   Osteoarthritis  Acute encephalopathy probably secondary to paranoid delusions, in the setting of decline in cognitive status with some progressive confusion and dementia.  Please see detailed note from [name] who has spoken to the patient's brother and the [facility] regarding her mental status.  She was seen by psychiatry recommended to start the patient on Depakote. Work up has been negative so far for infection.  MRI brain  shows Moderate to severe chronic small-vessel disease with scattered areas of old infarction throughout the brain. Psychiatry consulted for capacity evaluation and recommendations given.  I do not think she is safe to go home by herself unless she had 24 hour care at home. She is high fall risk and the next fall might be fatal especially with her vision impairment and her being on eliquis.  She would benefit from SNF placement. Currently waiting for safe disposition and family to make a decision regarding her  disposition.    Vision loss in the left eye.  Chronic blindness in the right eye.  Suspect progression of glaucoma as per [name] with Ophthalmology.  Recommended outpatient follow up with [name].    E coli UTI and possible atypical pneumonia:  Completed the course of antibiotics.   AKI on stage 3a  CKD Creatinine back to baseline.    Chronic systolic CHF: LAST EF is 30 to 35% with indeterminate diastolic dysfunction.  Continue with eliquis, benazepril 20 mg daily and metoprolol 50 mg daily.  She appears euvolemic.   Hypothyroidism:  Continue synthroid 100 mcg daily.    H/o CVA;  Reviewed the MRI brain without contrast.  Legally blind.    Poor Oral intake:  - eating less than 25% of her meals.  -- added megace to help with her appetite.   Hypokalemia: Resolved   DVT prophylaxis: SCDs Start: 08/20/22 0644   Code Status: Full Code  Family Communication:  None present at bedside.  Discussed in length with patient's nephew Thayer Ohm on 09/12/2022.  TOC is in communication with him.  Status is: Inpatient Remains inpatient appropriate because: Patient medically stable, pending placement to SNF.   Estimated body mass index is 22.67 kg/m as calculated from the following:   Height as of this encounter: 5\' 5"  (1.651 m).   Weight as of this encounter: 61.8 kg.    Nutritional Assessment: Body mass index is 22.67 kg/m.Marland Kitchen Seen by dietician.  I agree with the assessment and plan as outlined below: Nutrition Status:        . Skin Assessment: I have examined the patient's skin and I agree with the wound assessment as performed by the wound care RN as outlined below:    Consultants:  Psychiatry-Signed off  Procedures:  Plan  Antimicrobials:  Anti-infectives (From admission, onward)    Start     Dose/Rate Route Frequency Ordered Stop   08/24/22 2200  amoxicillin-clavulanate (AUGMENTIN) 500-125 MG per tablet 1 tablet        1 tablet Oral 2 times daily 08/24/22 1427 08/26/22 2228   08/24/22 1000  amoxicillin-clavulanate (AUGMENTIN) 875-125 MG per tablet 1 tablet  Status:  Discontinued        1 tablet Oral Every 12 hours 08/23/22 1255 08/24/22 1427   08/23/22 1345  doxycycline (VIBRA-TABS) tablet 100 mg  Status:  Discontinued        100 mg Oral Every 12 hours 08/23/22 1255  08/27/22 1425   08/21/22 0800  cefTRIAXone (ROCEPHIN) 1 g in sodium chloride 0.9 % 100 mL IVPB  Status:  Discontinued        1 g 200 mL/hr over 30 Minutes Intravenous Every 24 hours 08/20/22 0644 08/23/22 1255   08/20/22 0600  cefTRIAXone (ROCEPHIN) 1 g in sodium chloride 0.9 % 100 mL IVPB        1 g 200 mL/hr over 30 Minutes Intravenous  Once 08/20/22 0557 08/20/22 0808         Subjective: Attempted to see patient today but patient was in deep sleep and became agitated when I try to wake her up.  I attempted again at later time and she was still asleep.  Objective: Vitals:   09/12/22 0453 09/12/22 1447 09/12/22 2126 09/13/22 0532  BP: (!) 141/73 109/82 (!) 145/73 (!) 159/89  Pulse: (!) 56 93 66 65  Resp: 14  16 16   Temp: 97.7 F (36.5 C) 98.5 F (36.9 C)  (!) 97.4 F (36.3 C)  TempSrc:  Oral   Oral  SpO2: 90% 94% 93% 98%  Weight: 61.8 kg     Height:        Intake/Output Summary (Last 24 hours) at 09/13/2022 1423 Last data filed at 09/13/2022 0900 Gross per 24 hour  Intake 480 ml  Output --  Net 480 ml   Filed Weights   09/10/22 0520 09/11/22 0536 09/12/22 0453  Weight: 59.7 kg 59.7 kg 61.8 kg    Examination:  Unable to examine her since patient was deeply asleep.  She appeared comfortable.  Data Reviewed: I have personally reviewed following labs and imaging studies  CBC: No results for input(s): "WBC", "NEUTROABS", "HGB", "HCT", "MCV", "PLT" in the last 168 hours.  Basic Metabolic Panel: Recent Labs  Lab 09/07/22 1532 09/11/22 0837 09/12/22 0550  NA 140 141 142  K 3.7 3.1* 4.4  CL 108 105 108  CO2 22 27 27   GLUCOSE 75 105* 96  BUN 21 22 24*  CREATININE 1.05* 1.20* 1.25*  CALCIUM 8.3* 8.3* 8.5*   GFR: Estimated Creatinine Clearance: 25.8 mL/min (A) (by C-G formula based on SCr of 1.25 mg/dL (H)). Liver Function Tests: No results for input(s): "AST", "ALT", "ALKPHOS", "BILITOT", "PROT", "ALBUMIN" in the last 168 hours.  No results for input(s):  "LIPASE", "AMYLASE" in the last 168 hours. No results for input(s): "AMMONIA" in the last 168 hours.  Coagulation Profile: No results for input(s): "INR", "PROTIME" in the last 168 hours. Cardiac Enzymes: No results for input(s): "CKTOTAL", "CKMB", "CKMBINDEX", "TROPONINI" in the last 168 hours. BNP (last 3 results) No results for input(s): "PROBNP" in the last 8760 hours. HbA1C: No results for input(s): "HGBA1C" in the last 72 hours. CBG: No results for input(s): "GLUCAP" in the last 168 hours. Lipid Profile: No results for input(s): "CHOL", "HDL", "LDLCALC", "TRIG", "CHOLHDL", "LDLDIRECT" in the last 72 hours. Thyroid Function Tests: No results for input(s): "TSH", "T4TOTAL", "FREET4", "T3FREE", "THYROIDAB" in the last 72 hours. Anemia Panel: No results for input(s): "VITAMINB12", "FOLATE", "FERRITIN", "TIBC", "IRON", "RETICCTPCT" in the last 72 hours. Sepsis Labs: No results for input(s): "PROCALCITON", "LATICACIDVEN" in the last 168 hours.  No results found for this or any previous visit (from the past 240 hour(s)).   Radiology Studies: No results found.  Scheduled Meds:  apixaban  5 mg Oral BID   benazepril  20 mg Oral Daily   cholecalciferol  2,000 Units Oral Daily   divalproex  125 mg Oral Q8H   dorzolamide-timolol  1 drop Both Eyes BID   feeding supplement  237 mL Oral TID BM   latanoprost  1 drop Both Eyes QHS   levothyroxine  100 mcg Oral Q0600   megestrol  400 mg Oral Daily   metoprolol succinate  50 mg Oral Daily   pilocarpine  1 drop Both Eyes QID   thiamine  100 mg Oral Daily   Continuous Infusions:   LOS: 23 days   Hughie Closs, MD [facility]  09/13/2022, 2:23 PM   *Please note that this is a verbal dictation therefore any spelling or grammatical errors are due to the "[employer]agon [employer] One" system interpretation.  Please page via Amion and do not message via secure chat for urgent patient care matters. Secure chat can be used for non urgent  patient care matters.  How to contact the [facility] Attending or Consulting provider 7A - 7P or covering provider during after hours 7P -7A, for this patient?  Check the care team in [facility] and look for a)  attending/consulting [facility] provider listed and b) the [facility] team listed. Page or secure chat 7A-7P. Log into [email or web address] and use [facility] universal password to access. If you do not have the password, please contact the hospital operator. Locate the [facility] provider you are looking for under [facility] and page to a number that you can be directly reached. If you still have difficulty reaching the provider, please page the [facility] (Director on Call) for the Hospitalists listed on amion for assistance.

## 2022-09-14 DIAGNOSIS — G9341 Metabolic encephalopathy: Secondary | ICD-10-CM | POA: Diagnosis not present

## 2022-09-14 LAB — BASIC METABOLIC PANEL
Anion gap: 10 (ref 5–15)
BUN: 22 mg/dL (ref 8–23)
CO2: 21 mmol/L — ABNORMAL LOW (ref 22–32)
Calcium: 8.4 mg/dL — ABNORMAL LOW (ref 8.9–10.3)
Chloride: 109 mmol/L (ref 98–111)
Creatinine, Ser: 1.04 mg/dL — ABNORMAL HIGH (ref 0.44–1.00)
GFR, Estimated: 50 mL/min — ABNORMAL LOW (ref >60)
Glucose, Bld: 83 mg/dL (ref 70–99)
Potassium: 3.8 mmol/L (ref 3.5–5.1)
Sodium: 140 mmol/L (ref 135–145)

## 2022-09-14 NOTE — Plan of Care (Signed)
  Problem: Education: Goal: Knowledge of General Education information will improve Description: Including pain rating scale, medication(s)/side effects and non-pharmacologic comfort measures 09/14/2022 0059 by Jolene Provost, RN Outcome: Progressing 09/13/2022 2136 by Jolene Provost, RN Outcome: Progressing   Problem: Health Behavior/Discharge Planning: Goal: Ability to manage health-related needs will improve 09/14/2022 0059 by Jolene Provost, RN Outcome: Progressing 09/13/2022 2136 by Jolene Provost, RN Outcome: Progressing   Problem: Clinical Measurements: Goal: Ability to maintain clinical measurements within normal limits will improve 09/14/2022 0059 by Jolene Provost, RN Outcome: Progressing 09/13/2022 2136 by Jolene Provost, RN Outcome: Progressing Goal: Will remain free from infection 09/14/2022 0059 by Jolene Provost, RN Outcome: Progressing 09/13/2022 2136 by Jolene Provost, RN Outcome: Progressing Goal: Diagnostic test results will improve 09/14/2022 0059 by Jolene Provost, RN Outcome: Progressing 09/13/2022 2136 by Jolene Provost, RN Outcome: Progressing Goal: Respiratory complications will improve 09/14/2022 0059 by Jolene Provost, RN Outcome: Progressing 09/13/2022 2136 by Jolene Provost, RN Outcome: Progressing Goal: Cardiovascular complication will be avoided 09/14/2022 0059 by Jolene Provost, RN Outcome: Progressing 09/13/2022 2136 by Jolene Provost, RN Outcome: Progressing   Problem: Activity: Goal: Risk for activity intolerance will decrease 09/14/2022 0059 by Jolene Provost, RN Outcome: Progressing 09/13/2022 2136 by Jolene Provost, RN Outcome: Progressing   Problem: Nutrition: Goal: Adequate nutrition will be maintained 09/14/2022 0059 by Jolene Provost, RN Outcome: Progressing 09/13/2022 2136 by Jolene Provost, RN Outcome: Progressing   Problem: Coping: Goal: Level of anxiety will decrease 09/14/2022 0059 by Jolene Provost, RN Outcome:  Progressing 09/13/2022 2136 by Jolene Provost, RN Outcome: Progressing   Problem: Elimination: Goal: Will not experience complications related to bowel motility 09/14/2022 0059 by Jolene Provost, RN Outcome: Progressing 09/13/2022 2136 by Jolene Provost, RN Outcome: Progressing Goal: Will not experience complications related to urinary retention 09/14/2022 0059 by Jolene Provost, RN Outcome: Progressing 09/13/2022 2136 by Jolene Provost, RN Outcome: Progressing   Problem: Safety: Goal: Ability to remain free from injury will improve 09/14/2022 0059 by Jolene Provost, RN Outcome: Progressing 09/13/2022 2136 by Jolene Provost, RN Outcome: Progressing   Problem: Skin Integrity: Goal: Risk for impaired skin integrity will decrease 09/14/2022 0059 by Jolene Provost, RN Outcome: Progressing 09/13/2022 2136 by Jolene Provost, RN Outcome: Progressing

## 2022-09-14 NOTE — Progress Notes (Signed)
  Daily Progress Note   Patient Name: Amanda Cochran       Date: 09/14/2022 DOB: [birth year]  Age: 89 y.o. MRN#: [id number] Attending Physician: Hughie Closs, MD Primary Care Physician: [name], MD Admit Date: 08/19/2022 Length of Stay: 24 days  Palliative medicine team has been following peripherally with patient's medical care. Hospital course complicated by agitation at times. TOC working with patient's nephew to coordinate discharge to rehab facility. Awaiting possible transfer. Palliative medicine team will be available if needed. Goals currently determined. Please reach out if acute PMT needs arise.   Alvester Morin, DO Palliative Care Provider PMT # [phone]

## 2022-09-14 NOTE — Plan of Care (Signed)
  Problem: Education: Goal: Knowledge of General Education information will improve Description: Including pain rating scale, medication(s)/side effects and non-pharmacologic comfort measures Outcome: Progressing   Problem: Health Behavior/Discharge Planning: Goal: Ability to manage health-related needs will improve Outcome: Progressing   Problem: Clinical Measurements: Goal: Ability to maintain clinical measurements within normal limits will improve Outcome: Progressing Goal: Diagnostic test results will improve Outcome: Progressing Goal: Cardiovascular complication will be avoided Outcome: Progressing   Problem: Activity: Goal: Risk for activity intolerance will decrease Outcome: Progressing   Problem: Nutrition: Goal: Adequate nutrition will be maintained Outcome: Progressing   Problem: Coping: Goal: Level of anxiety will decrease Outcome: Progressing

## 2022-09-14 NOTE — Progress Notes (Signed)
PROGRESS NOTE    Amanda Cochran  WUJ:[id number] DOB: 1928-09-01 DOA: 08/19/2022 PCP: [name], MD   Brief Narrative:  89 y.o. female with medical history significant of open-angle glaucoma of both eyes with legal blindness, CVA with some residual right-sided weakness (uses a cane to ambulate), hyperlipidemia, hypertension, atrial fibrillation on Eliquis, hypothyroidism who was brought to the emergency department after she was found screaming in her house at "intruders."     Patient was felt to be suffering from acute metabolic encephalopathy secondary to possible urinary tract infection.  The hospitalist group was called to assess the patient for admission to the hospital.     Patient was placed on intravenous antibiotics with ceftriaxone.  During this hospitalization, echocardiogram revealed markedly low ejection fraction of 30 to 35% with indeterminate diastolic function and.  Patient did not appear to be in acute heart failure.  Case was discussed with cardiology and home regimen of beta-blocker therapy was titrated.   Doxycycline was added to the ceftriaxone later in the hospitalization due to additional concerns for possible pneumonia.   Over the course of the hospitalization the patient has exhibited bouts of agitation and delusions.  Psychiatry was consulted and felt the patient was likely suffering from residual delirium from her underlying illness and did not believe that the patient was suffering an underlying psychiatric condition.   Patient seen and examined she is teary and tells me that she cannot do much by herself. She wants to know if she can go home. We talked about safety issues, in view of her vision impairment and that she needs 24 hour care. Unfortunately her brother is not a position to help her, her nephew and niece work and unable to provide 24 hour care. Discussed safe disposition with her brother  and nephew, waiting for family to get back .     Assessment & Plan:    Principal Problem:   Acute metabolic encephalopathy Active Problems:   E-coli UTI   Pneumonia of left lower lobe due to infectious organism   Paranoid delusion (HCC)   Chronic systolic CHF (congestive heart failure) (HCC)   Acute renal failure superimposed on stage 3a chronic kidney disease (HCC)   Atrial fibrillation, chronic (HCC)   Essential hypertension   Hypothyroidism   Hyperlipidemia   Right hemiparesis (HCC)   Left sided lacunar stroke (HCC)   History of CVA (cerebrovascular accident)   MCI (mild cognitive impairment)   Primary open angle glaucoma of both eyes, severe stage   Acute HFrEF (heart failure with reduced ejection fraction) (HCC)   Osteoarthritis  Acute encephalopathy probably secondary to paranoid delusions, in the setting of decline in cognitive status with some progressive confusion and dementia.  Please see detailed note from [name] who has spoken to the patient's brother and the [facility] regarding her mental status.  She was seen by psychiatry recommended to start the patient on Depakote. Work up has been negative so far for infection.  MRI brain  shows Moderate to severe chronic small-vessel disease with scattered areas of old infarction throughout the brain. Psychiatry consulted for capacity evaluation and recommendations given.  I do not think she is safe to go home by herself unless she had 24 hour care at home. She is high fall risk and the next fall might be fatal especially with her vision impairment and her being on eliquis.  She would benefit from SNF placement. Currently waiting for safe disposition and family to make a decision regarding her  disposition.    Vision loss in the left eye.  Chronic blindness in the right eye.  Suspect progression of glaucoma as per [name] with Ophthalmology.  Recommended outpatient follow up with [name].    E coli UTI and possible atypical pneumonia:  Completed the course of antibiotics.   AKI on stage 3a  CKD Creatinine back to baseline.    Chronic systolic CHF: LAST EF is 30 to 35% with indeterminate diastolic dysfunction.  Continue with eliquis, benazepril 20 mg daily and metoprolol 50 mg daily.  She appears euvolemic.   Hypothyroidism:  Continue synthroid 100 mcg daily.    H/o CVA;  Reviewed the MRI brain without contrast.  Legally blind.    Poor Oral intake:  - eating less than 25% of her meals.  -- added megace to help with her appetite.   Hypokalemia: Resolved   DVT prophylaxis: SCDs Start: 08/20/22 0644   Code Status: Full Code  Family Communication:  None present at bedside.  Discussed in length with patient's nephew Thayer Ohm on 09/12/2022.  TOC is in communication with him.  Status is: Inpatient Remains inpatient appropriate because: Patient medically stable, pending placement to SNF.   Estimated body mass index is 23.08 kg/m as calculated from the following:   Height as of this encounter: 5\' 5"  (1.651 m).   Weight as of this encounter: 62.9 kg.    Nutritional Assessment: Body mass index is 23.08 kg/m.Marland Kitchen Seen by dietician.  I agree with the assessment and plan as outlined below: Nutrition Status:        . Skin Assessment: I have examined the patient's skin and I agree with the wound assessment as performed by the wound care RN as outlined below:    Consultants:  Psychiatry-Signed off  Procedures:  Plan  Antimicrobials:  Anti-infectives (From admission, onward)    Start     Dose/Rate Route Frequency Ordered Stop   08/24/22 2200  amoxicillin-clavulanate (AUGMENTIN) 500-125 MG per tablet 1 tablet        1 tablet Oral 2 times daily 08/24/22 1427 08/26/22 2228   08/24/22 1000  amoxicillin-clavulanate (AUGMENTIN) 875-125 MG per tablet 1 tablet  Status:  Discontinued        1 tablet Oral Every 12 hours 08/23/22 1255 08/24/22 1427   08/23/22 1345  doxycycline (VIBRA-TABS) tablet 100 mg  Status:  Discontinued        100 mg Oral Every 12 hours 08/23/22 1255  08/27/22 1425   08/21/22 0800  cefTRIAXone (ROCEPHIN) 1 g in sodium chloride 0.9 % 100 mL IVPB  Status:  Discontinued        1 g 200 mL/hr over 30 Minutes Intravenous Every 24 hours 08/20/22 0644 08/23/22 1255   08/20/22 0600  cefTRIAXone (ROCEPHIN) 1 g in sodium chloride 0.9 % 100 mL IVPB        1 g 200 mL/hr over 30 Minutes Intravenous  Once 08/20/22 0557 08/20/22 0808         Subjective: Attempted to see patient.  Once again, she was in deep sleep and would not wake up but she was comfortable.  Objective: Vitals:   09/13/22 1441 09/13/22 2109 09/14/22 0500 09/14/22 0503  BP: 137/62 130/66  (!) 140/76  Pulse: 63 65  (!) 56  Resp: 16 16  16   Temp: 98.3 F (36.8 C) 97.7 F (36.5 C)  97.6 F (36.4 C)  TempSrc:  Oral  Oral  SpO2: 100% 97%  97%  Weight:   62.9  kg   Height:        Intake/Output Summary (Last 24 hours) at 09/14/2022 1021 Last data filed at 09/14/2022 0900 Gross per 24 hour  Intake 480 ml  Output --  Net 480 ml   Filed Weights   09/11/22 0536 09/12/22 0453 09/14/22 0500  Weight: 59.7 kg 61.8 kg 62.9 kg    Examination:  Unable to examine her since patient was deeply asleep.  She appeared comfortable.  Data Reviewed: I have personally reviewed following labs and imaging studies  CBC: No results for input(s): "WBC", "NEUTROABS", "HGB", "HCT", "MCV", "PLT" in the last 168 hours.  Basic Metabolic Panel: Recent Labs  Lab 09/07/22 1532 09/11/22 0837 09/12/22 0550 09/14/22 0912  NA 140 141 142 140  K 3.7 3.1* 4.4 3.8  CL 108 105 108 109  CO2 22 27 27  21*  GLUCOSE 75 105* 96 83  BUN 21 22 24* 22  CREATININE 1.05* 1.20* 1.25* 1.04*  CALCIUM 8.3* 8.3* 8.5* 8.4*   GFR: Estimated Creatinine Clearance: 31.1 mL/min (A) (by C-G formula based on SCr of 1.04 mg/dL (H)). Liver Function Tests: No results for input(s): "AST", "ALT", "ALKPHOS", "BILITOT", "PROT", "ALBUMIN" in the last 168 hours.  No results for input(s): "LIPASE", "AMYLASE" in the last 168  hours. No results for input(s): "AMMONIA" in the last 168 hours.  Coagulation Profile: No results for input(s): "INR", "PROTIME" in the last 168 hours. Cardiac Enzymes: No results for input(s): "CKTOTAL", "CKMB", "CKMBINDEX", "TROPONINI" in the last 168 hours. BNP (last 3 results) No results for input(s): "PROBNP" in the last 8760 hours. HbA1C: No results for input(s): "HGBA1C" in the last 72 hours. CBG: No results for input(s): "GLUCAP" in the last 168 hours. Lipid Profile: No results for input(s): "CHOL", "HDL", "LDLCALC", "TRIG", "CHOLHDL", "LDLDIRECT" in the last 72 hours. Thyroid Function Tests: No results for input(s): "TSH", "T4TOTAL", "FREET4", "T3FREE", "THYROIDAB" in the last 72 hours. Anemia Panel: No results for input(s): "VITAMINB12", "FOLATE", "FERRITIN", "TIBC", "IRON", "RETICCTPCT" in the last 72 hours. Sepsis Labs: No results for input(s): "PROCALCITON", "LATICACIDVEN" in the last 168 hours.  No results found for this or any previous visit (from the past 240 hour(s)).   Radiology Studies: No results found.  Scheduled Meds:  apixaban  5 mg Oral BID   benazepril  20 mg Oral Daily   cholecalciferol  2,000 Units Oral Daily   divalproex  125 mg Oral Q8H   dorzolamide-timolol  1 drop Both Eyes BID   feeding supplement  237 mL Oral TID BM   latanoprost  1 drop Both Eyes QHS   levothyroxine  100 mcg Oral Q0600   megestrol  400 mg Oral Daily   metoprolol succinate  50 mg Oral Daily   pilocarpine  1 drop Both Eyes QID   thiamine  100 mg Oral Daily   Continuous Infusions:   LOS: 24 days   Hughie Closs, MD [facility]  09/14/2022, 10:21 AM   *Please note that this is a verbal dictation therefore any spelling or grammatical errors are due to the "Dragon [employer] One" system interpretation.  Please page via Amion and do not message via secure chat for urgent patient care matters. Secure chat can be used for non urgent patient care matters.  How to contact  the [facility] Attending or Consulting provider 7A - 7P or covering provider during after hours 7P -7A, for this patient?  Check the care team in [facility] and look for a) attending/consulting TRH provider listed and  b) the [facility] team listed. Page or secure chat 7A-7P. Log into [email or web address] and use [facility]'s universal password to access. If you do not have the password, please contact the hospital operator. Locate the [facility] provider you are looking for under [facility] and page to a number that you can be directly reached. If you still have difficulty reaching the provider, please page the [facility] (Director on Call) for the Hospitalists listed on amion for assistance.

## 2022-09-14 NOTE — Progress Notes (Signed)
Physical Therapy Treatment Patient Details Name: Amanda Cochran MRN: [id number] DOB: Feb 04, 1929 Today's Date: 09/14/2022   History of Present Illness Pt is a 89yo female presenting to [facility] ED on 6/27 secondary AMS with associated UTI, CT head negative for acute findings.   PMH: depression, hx of CVA (L basal ganglia infarct with R hemiparesis), Hld, HTN, hx of left eye lacunar stroke,    PT Comments  General Comments: AxO x 2 pleasant following simple commands. Pt was already OOB in recliner.  General transfer comment: required increased effort and time to rise from recliner with VC's on proper hand placement and safety with turns due to impaired cognition as well as low vision.  Also assisted with a toilet transfer which required increased asisst to rise from lower level. General Gait Details: limited amb distance first to bathroom with asisst to navigate walker safely around obsticles due to low vision.  Then assisted to amb in hallway.  Unsteady.  Poor self correction to midline.  Assist navigating walker to avoid obsticles.  Mod c/o weakness anf Max c/o fatigue after. Assisted back to bed.  Removed shoes.  General bed mobility comments: Mod Assist to support B LE up into bed then position Pt to comfort. Pt lives home alone.  Pt will need ST Rehab at SNF to address mobility and functional decline prior to safely returning home.     Assistance Recommended at Discharge Frequent or constant Supervision/Assistance  If plan is discharge home, recommend the following:  Can travel by private vehicle    A little help with walking and/or transfers;A little help with bathing/dressing/bathroom;Assistance with cooking/housework;Direct supervision/assist for medications management;Direct supervision/assist for financial management;Help with stairs or ramp for entrance;Assist for transportation   No  Equipment Recommendations  None recommended by PT    Recommendations for Other Services        Precautions / Restrictions Precautions Precautions: Fall Precaution Comments: vision impairment, HOH Restrictions Weight Bearing Restrictions: No     Mobility  Bed Mobility Overal bed mobility: Needs Assistance Bed Mobility: Sit to Supine       Sit to supine: Mod assist   General bed mobility comments: Mod Assist to support B LE up into bed then position Pt to comfort.    Transfers Overall transfer level: Needs assistance Equipment used: Rolling walker (2 wheels) Transfers: Sit to/from Stand Sit to Stand: Min assist           General transfer comment: required increased effort and time to rise from recliner with VC's on proper hand placement and safety with turns due to impaired cognition as well as low vision.  Also assisted with a toilet transfer which required increased asisst to rise from lower level.    Ambulation/Gait Ambulation/Gait assistance: Min assist, Mod assist Gait Distance (Feet): 16 Feet Assistive device: Rolling walker (2 wheels) Gait Pattern/deviations: Step-through pattern, Decreased stride length, Shuffle, Trunk flexed Gait velocity: decr     General Gait Details: limited amb distance first to bathroom with asisst to navigate walker safely around obsticles due to low vision.  Then assisted to amb in hallway.  Unsteady.  Poor self correction to midline.  Assist navigating walker to avoid obsticles.  Mod c/o weakness anf Max c/o fatigue after.   Stairs             Wheelchair Mobility     Tilt Bed    Modified Rankin (Stroke Patients Only)       Balance  Cognition Arousal/Alertness: Awake/alert Behavior During Therapy: Anxious, Restless Overall Cognitive Status: No family/caregiver present to determine baseline cognitive functioning Area of Impairment: Memory, Orientation                 Orientation Level: Disoriented to, Time             General Comments:  AxO x 2 pleasant following simple commands.        Exercises      General Comments        Pertinent Vitals/Pain Pain Assessment Pain Assessment: Faces Faces Pain Scale: Hurts little more Pain Location: feet in standing.  B plantar forefoot present with black, hard calluses.  Applied B shoes. Pain Descriptors / Indicators: Grimacing    Home Living                          Prior Function            PT Goals (current goals can now be found in the care plan section) Progress towards PT goals: Progressing toward goals    Frequency    Min 1X/week      PT Plan Current plan remains appropriate    Co-evaluation              AM-PAC PT "6 Clicks" Mobility   Outcome Measure  Help needed turning from your back to your side while in a flat bed without using bedrails?: A Lot Help needed moving from lying on your back to sitting on the side of a flat bed without using bedrails?: A Lot Help needed moving to and from a bed to a chair (including a wheelchair)?: A Lot Help needed standing up from a chair using your arms (e.g., wheelchair or bedside chair)?: A Lot Help needed to walk in hospital room?: A Lot Help needed climbing 3-5 steps with a railing? : Total 6 Click Score: 11    End of Session Equipment Utilized During Treatment: Gait belt Activity Tolerance: Patient limited by fatigue Patient left: in bed;with call bell/phone within reach;with bed alarm set Nurse Communication: Mobility status PT Visit Diagnosis: Unsteadiness on feet (R26.81);Difficulty in walking, not elsewhere classified (R26.2);Muscle weakness (generalized) (M62.81)     Time: 8295-6213 PT Time Calculation (min) (ACUTE ONLY): 20 min  Charges:    $Gait Training: 8-22 mins PT General Charges $$ ACUTE PT VISIT: 1 Visit                     Felecia Shelling  PTA Acute  Rehabilitation Services Office M-F          [phone]

## 2022-09-14 NOTE — TOC Progression Note (Addendum)
Transition of Care [facility]) - Progression Note    Patient Details  Name: Amanda Cochran MRN: [id number] Date of Birth: November 17, 1928  Transition of [facility]) CM/SW Contact  Harriett Sine, RN Phone Number: 09/14/2022, 12:22 PM  Clinical Narrative:    Spoke with pt at bedside. Pt oriented to self only. She wants to know when Thayer Ohm (nephew) is coming to hospital. Evonnie Pat at [place] about bed availability. Confirmed that she had Chris's (nephew ) information.   Update Liborio Nixon called and  left a message with nephew for a return call and admission information.   Thayer Ohm (nephew) returned call to [place] with email address requested by Gilford Silvius at [place]    Expected Discharge Plan: Home/Self Care Barriers to Discharge: ED Unsafe disposition  Expected Discharge Plan and Services   Discharge Planning Services: CM Consult   Living arrangements for the past 2 months: Single Family Home Expected Discharge Date: 08/29/22                                     Social Determinants of Health (SDOH) Interventions SDOH Screenings   Food Insecurity: No Food Insecurity (08/21/2022)  Housing: Low Risk  (08/21/2022)  Transportation Needs: No Transportation Needs (08/21/2022)  Recent Concern: Transportation Needs - Unmet Transportation Needs (08/20/2022)  Utilities: Not At Risk (08/21/2022)  Tobacco Use: Low Risk  (08/20/2022)    Readmission Risk Interventions     No data to display

## 2022-09-15 DIAGNOSIS — G9341 Metabolic encephalopathy: Secondary | ICD-10-CM | POA: Diagnosis not present

## 2022-09-15 NOTE — TOC Progression Note (Addendum)
Transition of Care [facility]) - Progression Note    Patient Details  Name: Amanda Cochran MRN: [id number] Date of Birth: 1928/03/06  Transition of Care [facility]) CM/SW Contact  Harriett Sine, RN Phone Number: 09/15/2022, 10:00 AM  Clinical Narrative:    Called nephew Thayer Ohm) and left a message concerning information needed by Gilford Silvius at [place].    Pt's nephew Thayer Ohm was unable to complete the needed forms for [facility] by the facility's deadline. Bed was reassigned and Thayer Ohm (nephew) was notified that when he completes the needed forms the facility will check for bed available at that time.  Transition of Care Supervisor updated.   Expected Discharge Plan: Home/Self Care Barriers to Discharge: ED Unsafe disposition  Expected Discharge Plan and Services   Discharge Planning Services: CM Consult   Living arrangements for the past 2 months: Single Family Home Expected Discharge Date: 08/29/22                                     Social Determinants of Health (SDOH) Interventions SDOH Screenings   Food Insecurity: No Food Insecurity (08/21/2022)  Housing: Low Risk  (08/21/2022)  Transportation Needs: No Transportation Needs (08/21/2022)  Recent Concern: Transportation Needs - Unmet Transportation Needs (08/20/2022)  Utilities: Not At Risk (08/21/2022)  Tobacco Use: Low Risk  (08/20/2022)    Readmission Risk Interventions     No data to display

## 2022-09-15 NOTE — Progress Notes (Signed)
Occupational Therapy Treatment Patient Details Name: Amanda Cochran MRN: [id number] DOB: 1928/07/06 Today's Date: 09/15/2022   History of present illness Pt is a 89yo female presenting to [facility] ED on 6/27 secondary AMS with associated UTI, CT head negative for acute findings.   PMH: depression, hx of CVA (L basal ganglia infarct with R hemiparesis), Hld, HTN, hx of left eye lacunar stroke,   OT comments  Patient was found standing in room attempting to go to bathroom with no walker. Patient was provided with walker and educated on using call light. Patient reported she used it and no on helps. Light was not on upon entrance to room. Patient was upset with incontinence episode of urine with increased time to participate in hygiene tasks. Patient noted to report " mice" in room who " their dad is a Emergency planning/management officer so you better watch out". Nurse made aware. Patient continues to require 24/7 caregiver support in next level of care. Patient's discharge plan remains appropriate at this time. OT will continue to follow acutely.     Recommendations for follow up therapy are one component of a multi-disciplinary discharge planning process, led by the attending physician.  Recommendations may be updated based on patient status, additional functional criteria and insurance authorization.    Assistance Recommended at Discharge Frequent or constant Supervision/Assistance  Patient can return home with the following  A little help with walking and/or transfers;A little help with bathing/dressing/bathroom;Assist for transportation;Assistance with cooking/housework;Direct supervision/assist for financial management;Direct supervision/assist for medications management   Equipment Recommendations  None recommended by OT       Precautions / Restrictions Precautions Precautions: Fall Precaution Comments: vision impairment, HOH Restrictions Weight Bearing Restrictions: No       Mobility Bed Mobility Overal bed  mobility: Needs Assistance Bed Mobility: Sit to Supine     Supine to sit: Min guard Sit to supine: Min guard   General bed mobility comments: with increased time.       Balance Overall balance assessment: Needs assistance Sitting-balance support: Feet supported Sitting balance-Leahy Scale: Fair     Standing balance support: During functional activity, Reliant on assistive device for balance Standing balance-Leahy Scale: Fair       ADL either performed or assessed with clinical judgement   ADL Overall ADL's : Needs assistance/impaired                       Lower Body Dressing Details (indicate cue type and reason): patient was able to doff shoes sitting EOB with increased time. Toilet Transfer: Minimal assistance;Ambulation;Rolling walker (2 wheels);Grab bars Toilet Transfer Details (indicate cue type and reason): patient was standing in room reporting wet gown. patient did not use call light. patient was min A to stand up from commode with increased time and cues for proper hand placement. Toileting- Clothing Manipulation and Hygiene: Minimal assistance;Sit to/from stand Toileting - Clothing Manipulation Details (indicate cue type and reason): with increased time.              Cognition Arousal/Alertness: Awake/alert Behavior During Therapy: Anxious Overall Cognitive Status: No family/caregiver present to determine baseline cognitive functioning     General Comments: Patient needed increased volume to hear therapist. patient reported seeing "mice" in the room during session. patient when further asked to describe them she reported " you know the little mice whos dad works for the police, so be careful"  Pertinent Vitals/ Pain       Pain Assessment Pain Assessment: Faces Faces Pain Scale: Hurts a little bit Pain Location: bilateral feet with standing. Pain Descriptors / Indicators: Grimacing Pain Intervention(s): Limited activity  within patient's tolerance, Monitored during session         Frequency  Min 1X/week        Progress Toward Goals  OT Goals(current goals can now be found in the care plan section)  Progress towards OT goals: Progressing toward goals     Plan Discharge plan remains appropriate       AM-PAC OT "6 Clicks" Daily Activity     Outcome Measure   Help from another person eating meals?: None Help from another person taking care of personal grooming?: A Little Help from another person toileting, which includes using toliet, bedpan, or urinal?: A Little Help from another person bathing (including washing, rinsing, drying)?: A Lot Help from another person to put on and taking off regular upper body clothing?: A Little Help from another person to put on and taking off regular lower body clothing?: A Lot 6 Click Score: 17    End of Session Equipment Utilized During Treatment: Rolling walker (2 wheels);Gait belt  OT Visit Diagnosis: Low vision, both eyes (H54.2);Unsteadiness on feet (R26.81)   Activity Tolerance Patient tolerated treatment well   Patient Left in chair;with call bell/phone within reach;with chair alarm set   Nurse Communication Mobility status        Time: 0981-1914 OT Time Calculation (min): 19 min  Charges: OT General Charges $OT Visit: 1 Visit OT Treatments $Self Care/Home Management : 8-22 mins  Amanda Loud, MS Acute Rehabilitation Department Office# [phone]   Amanda Cochran 09/15/2022, 4:37 PM

## 2022-09-15 NOTE — Progress Notes (Signed)
Occupational Therapy Treatment Patient Details Name: Amanda Cochran MRN: [id number] DOB: 05-26-1928 Today's Date: 09/15/2022   History of present illness Pt is a 89yo female presenting to [facility] ED on 6/27 secondary AMS with associated UTI, CT head negative for acute findings.   PMH: depression, hx of CVA (L basal ganglia infarct with R hemiparesis), Hld, HTN, hx of left eye lacunar stroke,   OT comments  Patient continues to have confusion about hospital stay with reports of increased visual deficits in L eye. Patient's session was limited with arrival of lunch with patient reporting she did not eat breakfast. Patient will continue to need 24/7 caregiver support in next level of care.Patient will benefit from continued inpatient follow up therapy, <3 hours/day. Patient's discharge plan remains appropriate at this time. OT will continue to follow acutely.      Recommendations for follow up therapy are one component of a multi-disciplinary discharge planning process, led by the attending physician.  Recommendations may be updated based on patient status, additional functional criteria and insurance authorization.    Assistance Recommended at Discharge Frequent or constant Supervision/Assistance  Patient can return home with the following  A little help with walking and/or transfers;A little help with bathing/dressing/bathroom;Assist for transportation;Assistance with cooking/housework;Direct supervision/assist for financial management;Direct supervision/assist for medications management   Equipment Recommendations  None recommended by OT       Precautions / Restrictions Precautions Precautions: Fall Precaution Comments: vision impairment, HOH Restrictions Weight Bearing Restrictions: No       Mobility Bed Mobility Overal bed mobility: Needs Assistance Bed Mobility: Sit to Supine     Supine to sit: Min guard                       ADL either performed or assessed with clinical  judgement   ADL Overall ADL's : Needs assistance/impaired Eating/Feeding: Set up;Sitting Eating/Feeding Details (indicate cue type and reason): with education on where items were located on tray. needed cues to use purel wipe for hands only.                 Lower Body Dressing: Min guard Lower Body Dressing Details (indicate cue type and reason): patient was able to don shoes with increased time bringing feet into lap   Toilet Transfer Details (indicate cue type and reason): patient declined needing to use toilet at this time.         Functional mobility during ADLs: Min guard General ADL Comments: patient was min guard to walk from room in 1601 to window in hallway.ptient needed cues to avoid obstacles on the R side. patient reported walker was pulling to R side with patients purse noted to be on RUE. patient was educated on purse weight effecting walker with patietn unable to verbalize understanding about this education nor was agreeable to move purse to different location. patient declined to participate in bathing, or toileting tasks.      Cognition Arousal/Alertness: Awake/alert Behavior During Therapy: Anxious Overall Cognitive Status: No family/caregiver present to determine baseline cognitive functioning         General Comments: Patient needed increased volume to hear therapist. patient easily agitated during session. patient reported being up all night hearing people in the hallway but unable to hear therapist next to her when speaking loudly.                   Pertinent Vitals/ Pain       Pain Assessment Pain Assessment:  Faces Faces Pain Scale: Hurts a little bit Pain Location: bilateral feet with standing. Pain Descriptors / Indicators: Grimacing Pain Intervention(s): Limited activity within patient's tolerance, Monitored during session         Frequency  Min 1X/week        Progress Toward Goals  OT Goals(current goals can now be found in the  care plan section)  Progress towards OT goals: Progressing toward goals     Plan Discharge plan remains appropriate       AM-PAC OT "6 Clicks" Daily Activity     Outcome Measure   Help from another person eating meals?: None Help from another person taking care of personal grooming?: A Little Help from another person toileting, which includes using toliet, bedpan, or urinal?: A Little Help from another person bathing (including washing, rinsing, drying)?: A Lot Help from another person to put on and taking off regular upper body clothing?: A Little Help from another person to put on and taking off regular lower body clothing?: A Lot 6 Click Score: 17    End of Session Equipment Utilized During Treatment: Rolling walker (2 wheels);Gait belt  OT Visit Diagnosis: Low vision, both eyes (H54.2);Unsteadiness on feet (R26.81)   Activity Tolerance Patient tolerated treatment well   Patient Left in chair;with call bell/phone within reach;with chair alarm set   Nurse Communication Mobility status;Other (comment) (patient reporting missing items that are currently located in her purse.)        Time: 1610-9604 OT Time Calculation (min): 20 min  Charges: OT General Charges $OT Visit: 1 Visit OT Treatments $Self Care/Home Management : 8-22 mins  Rosalio Loud, MS Acute Rehabilitation Department Office# [phone]   Selinda Flavin 09/15/2022, 12:40 PM

## 2022-09-15 NOTE — Progress Notes (Signed)
PROGRESS NOTE    Amanda Cochran  ZHY:[id number] DOB: 1928/08/26 DOA: 08/19/2022 PCP: [name], MD   Brief Narrative:  89 y.o. female with medical history significant of open-angle glaucoma of both eyes with legal blindness, CVA with some residual right-sided weakness (uses a cane to ambulate), hyperlipidemia, hypertension, atrial fibrillation on Eliquis, hypothyroidism who was brought to the emergency department after she was found screaming in her house at "intruders."     Patient was felt to be suffering from acute metabolic encephalopathy secondary to possible urinary tract infection.  The hospitalist group was called to assess the patient for admission to the hospital.     Patient was placed on intravenous antibiotics with ceftriaxone.  During this hospitalization, echocardiogram revealed markedly low ejection fraction of 30 to 35% with indeterminate diastolic function and.  Patient did not appear to be in acute heart failure.  Case was discussed with cardiology and home regimen of beta-blocker therapy was titrated.   Doxycycline was added to the ceftriaxone later in the hospitalization due to additional concerns for possible pneumonia.   Over the course of the hospitalization the patient has exhibited bouts of agitation and delusions.  Psychiatry was consulted and felt the patient was likely suffering from residual delirium from her underlying illness and did not believe that the patient was suffering an underlying psychiatric condition.   Patient seen and examined she is teary and tells me that she cannot do much by herself. She wants to know if she can go home. We talked about safety issues, in view of her vision impairment and that she needs 24 hour care. Unfortunately her brother is not a position to help her, her nephew and niece work and unable to provide 24 hour care. Discussed safe disposition with her brother  and nephew, waiting for family to get back .     Assessment & Plan:    Principal Problem:   Acute metabolic encephalopathy Active Problems:   E-coli UTI   Pneumonia of left lower lobe due to infectious organism   Paranoid delusion (HCC)   Chronic systolic CHF (congestive heart failure) (HCC)   Acute renal failure superimposed on stage 3a chronic kidney disease (HCC)   Atrial fibrillation, chronic (HCC)   Essential hypertension   Hypothyroidism   Hyperlipidemia   Right hemiparesis (HCC)   Left sided lacunar stroke (HCC)   History of CVA (cerebrovascular accident)   MCI (mild cognitive impairment)   Primary open angle glaucoma of both eyes, severe stage   Acute HFrEF (heart failure with reduced ejection fraction) (HCC)   Osteoarthritis  Acute encephalopathy probably secondary to paranoid delusions, in the setting of decline in cognitive status with some progressive confusion and dementia.  Please see detailed note from [name] who has spoken to the patient's brother and the [facility] regarding her mental status.  She was seen by psychiatry recommended to start the patient on Depakote. Work up has been negative so far for infection.  MRI brain  shows Moderate to severe chronic small-vessel disease with scattered areas of old infarction throughout the brain. Psychiatry consulted for capacity evaluation and recommendations given.  I do not think she is safe to go home by herself unless she had 24 hour care at home. She is high fall risk and the next fall might be fatal especially with her vision impairment and her being on eliquis.  She would benefit from SNF placement. Currently waiting for safe disposition and family to make a decision regarding her  disposition.    Vision loss in the left eye.  Chronic blindness in the right eye.  Suspect progression of glaucoma as per [name] with Ophthalmology.  Recommended outpatient follow up with [name].    E coli UTI and possible atypical pneumonia:  Completed the course of antibiotics.   AKI on stage 3a  CKD Creatinine back to baseline.    Chronic systolic CHF: LAST EF is 30 to 35% with indeterminate diastolic dysfunction.  Continue with eliquis, benazepril 20 mg daily and metoprolol 50 mg daily.  She appears euvolemic.   Hypothyroidism:  Continue synthroid 100 mcg daily.    H/o CVA;  Reviewed the MRI brain without contrast.  Legally blind.    Poor Oral intake:  - eating less than 25% of her meals.  -- added megace to help with her appetite.   Hypokalemia: Resolved   DVT prophylaxis: SCDs Start: 08/20/22 0644   Code Status: Full Code  Family Communication:  None present at bedside.  Discussed in length with patient's nephew Thayer Ohm on 09/12/2022.  TOC is in communication with him.  Status is: Inpatient Remains inpatient appropriate because: Patient medically stable, pending placement to SNF.   Estimated body mass index is 20.36 kg/m as calculated from the following:   Height as of this encounter: 5\' 5"  (1.651 m).   Weight as of this encounter: 55.5 kg.    Nutritional Assessment: Body mass index is 20.36 kg/m.Marland Kitchen Seen by dietician.  I agree with the assessment and plan as outlined below: Nutrition Status:        . Skin Assessment: I have examined the patient's skin and I agree with the wound assessment as performed by the wound care RN as outlined below:    Consultants:  Psychiatry-Signed off  Procedures:  Plan  Antimicrobials:  Anti-infectives (From admission, onward)    Start     Dose/Rate Route Frequency Ordered Stop   08/24/22 2200  amoxicillin-clavulanate (AUGMENTIN) 500-125 MG per tablet 1 tablet        1 tablet Oral 2 times daily 08/24/22 1427 08/26/22 2228   08/24/22 1000  amoxicillin-clavulanate (AUGMENTIN) 875-125 MG per tablet 1 tablet  Status:  Discontinued        1 tablet Oral Every 12 hours 08/23/22 1255 08/24/22 1427   08/23/22 1345  doxycycline (VIBRA-TABS) tablet 100 mg  Status:  Discontinued        100 mg Oral Every 12 hours 08/23/22 1255  08/27/22 1425   08/21/22 0800  cefTRIAXone (ROCEPHIN) 1 g in sodium chloride 0.9 % 100 mL IVPB  Status:  Discontinued        1 g 200 mL/hr over 30 Minutes Intravenous Every 24 hours 08/20/22 0644 08/23/22 1255   08/20/22 0600  cefTRIAXone (ROCEPHIN) 1 g in sodium chloride 0.9 % 100 mL IVPB        1 g 200 mL/hr over 30 Minutes Intravenous  Once 08/20/22 0557 08/20/22 7829         Subjective: Patient seen and examined.  Sleepy but arousable today.  No complaints, actually oriented.  Objective: Vitals:   09/14/22 0503 09/14/22 1403 09/15/22 0500 09/15/22 1100  BP: (!) 140/76 135/70  123/68  Pulse: (!) 56 63  65  Resp: 16 20    Temp: 97.6 F (36.4 C) 97.7 F (36.5 C)    TempSrc: Oral Oral    SpO2: 97% 93%    Weight:   55.5 kg   Height:  Intake/Output Summary (Last 24 hours) at 09/15/2022 1129 Last data filed at 09/14/2022 1326 Gross per 24 hour  Intake 240 ml  Output --  Net 240 ml   Filed Weights   09/12/22 0453 09/14/22 0500 09/15/22 0500  Weight: 61.8 kg 62.9 kg 55.5 kg    Examination:  General exam: Appears calm and comfortable  Respiratory system: Clear to auscultation. Respiratory effort normal. Cardiovascular system: S1 & S2 heard, RRR. No JVD, murmurs, rubs, gallops or clicks. No pedal edema. Gastrointestinal system: Abdomen is nondistended, soft and nontender. No organomegaly or masses felt. Normal bowel sounds heard. Central nervous system: Alert and oriented x 1. No focal neurological deficits. Extremities: Symmetric 5 x 5 power. Skin: No rashes, lesions or ulcers.  Psychiatry: Judgement and insight appear poor Data Reviewed: I have personally reviewed following labs and imaging studies  CBC: No results for input(s): "WBC", "NEUTROABS", "HGB", "HCT", "MCV", "PLT" in the last 168 hours.  Basic Metabolic Panel: Recent Labs  Lab 09/11/22 0837 09/12/22 0550 09/14/22 0912  NA 141 142 140  K 3.1* 4.4 3.8  CL 105 108 109  CO2 27 27 21*  GLUCOSE  105* 96 83  BUN 22 24* 22  CREATININE 1.20* 1.25* 1.04*  CALCIUM 8.3* 8.5* 8.4*   GFR: Estimated Creatinine Clearance: 30.2 mL/min (A) (by C-G formula based on SCr of 1.04 mg/dL (H)). Liver Function Tests: No results for input(s): "AST", "ALT", "ALKPHOS", "BILITOT", "PROT", "ALBUMIN" in the last 168 hours.  No results for input(s): "LIPASE", "AMYLASE" in the last 168 hours. No results for input(s): "AMMONIA" in the last 168 hours.  Coagulation Profile: No results for input(s): "INR", "PROTIME" in the last 168 hours. Cardiac Enzymes: No results for input(s): "CKTOTAL", "CKMB", "CKMBINDEX", "TROPONINI" in the last 168 hours. BNP (last 3 results) No results for input(s): "PROBNP" in the last 8760 hours. HbA1C: No results for input(s): "HGBA1C" in the last 72 hours. CBG: No results for input(s): "GLUCAP" in the last 168 hours. Lipid Profile: No results for input(s): "CHOL", "HDL", "LDLCALC", "TRIG", "CHOLHDL", "LDLDIRECT" in the last 72 hours. Thyroid Function Tests: No results for input(s): "TSH", "T4TOTAL", "FREET4", "T3FREE", "THYROIDAB" in the last 72 hours. Anemia Panel: No results for input(s): "VITAMINB12", "FOLATE", "FERRITIN", "TIBC", "IRON", "RETICCTPCT" in the last 72 hours. Sepsis Labs: No results for input(s): "PROCALCITON", "LATICACIDVEN" in the last 168 hours.  No results found for this or any previous visit (from the past 240 hour(s)).   Radiology Studies: No results found.  Scheduled Meds:  apixaban  5 mg Oral BID   benazepril  20 mg Oral Daily   cholecalciferol  2,000 Units Oral Daily   divalproex  125 mg Oral Q8H   dorzolamide-timolol  1 drop Both Eyes BID   feeding supplement  237 mL Oral TID BM   latanoprost  1 drop Both Eyes QHS   levothyroxine  100 mcg Oral Q0600   megestrol  400 mg Oral Daily   metoprolol succinate  50 mg Oral Daily   pilocarpine  1 drop Both Eyes QID   thiamine  100 mg Oral Daily   Continuous Infusions:   LOS: 25 days   Hughie Closs, MD [facility]  09/15/2022, 11:29 AM   *Please note that this is a verbal dictation therefore any spelling or grammatical errors are due to the "Dragon [employer] One" system interpretation.  Please page via Amion and do not message via secure chat for urgent patient care matters. Secure chat can be used for  non urgent patient care matters.  How to contact the [facility] Attending or Consulting provider 7A - 7P or covering provider during after hours 7P -7A, for this patient?  Check the care team in [facility] and look for a) attending/consulting [facility] provider listed and b) the [facility] team listed. Page or secure chat 7A-7P. Log into [email or web address] and use [place]'s universal password to access. If you do not have the password, please contact the hospital operator. Locate the [facility] provider you are looking for under [facility] and page to a number that you can be directly reached. If you still have difficulty reaching the provider, please page the [facility] (Director on Call) for the Hospitalists listed on amion for assistance.

## 2022-09-15 NOTE — Plan of Care (Signed)
  Problem: Activity: Goal: Risk for activity intolerance will decrease Outcome: Progressing   Problem: Coping: Goal: Level of anxiety will decrease Outcome: Progressing   Problem: Elimination: Goal: Will not experience complications related to bowel motility Outcome: Progressing Goal: Will not experience complications related to urinary retention Outcome: Progressing   Problem: Safety: Goal: Ability to remain free from injury will improve Outcome: Progressing   Problem: Skin Integrity: Goal: Risk for impaired skin integrity will decrease Outcome: Progressing

## 2022-09-15 NOTE — Plan of Care (Signed)
Pt alert, answering questions appropriately.  Pt stating she wants to go home but understands she needs assistance, then intermittently becomes confused.  Pt up to toilet with walker one time assist. Urinated and bowel movement.  Pt resting with eyes closed most of the shift. Pt likes strawberry protein drinks consuming most of the bottle.  Pt taking pills whole with thickened protein drink.

## 2022-09-16 DIAGNOSIS — J188 Other pneumonia, unspecified organism: Secondary | ICD-10-CM | POA: Diagnosis not present

## 2022-09-16 DIAGNOSIS — M47814 Spondylosis without myelopathy or radiculopathy, thoracic region: Secondary | ICD-10-CM | POA: Diagnosis not present

## 2022-09-16 DIAGNOSIS — K7689 Other specified diseases of liver: Secondary | ICD-10-CM | POA: Diagnosis not present

## 2022-09-16 DIAGNOSIS — N1831 Chronic kidney disease, stage 3a: Secondary | ICD-10-CM | POA: Diagnosis not present

## 2022-09-16 DIAGNOSIS — G9340 Encephalopathy, unspecified: Secondary | ICD-10-CM | POA: Diagnosis not present

## 2022-09-16 DIAGNOSIS — L84 Corns and callosities: Secondary | ICD-10-CM | POA: Diagnosis not present

## 2022-09-16 DIAGNOSIS — S0993XA Unspecified injury of face, initial encounter: Secondary | ICD-10-CM | POA: Diagnosis not present

## 2022-09-16 DIAGNOSIS — R404 Transient alteration of awareness: Secondary | ICD-10-CM | POA: Diagnosis not present

## 2022-09-16 DIAGNOSIS — R918 Other nonspecific abnormal finding of lung field: Secondary | ICD-10-CM | POA: Diagnosis not present

## 2022-09-16 DIAGNOSIS — M6281 Muscle weakness (generalized): Secondary | ICD-10-CM | POA: Diagnosis not present

## 2022-09-16 DIAGNOSIS — Z8673 Personal history of transient ischemic attack (TIA), and cerebral infarction without residual deficits: Secondary | ICD-10-CM | POA: Diagnosis not present

## 2022-09-16 DIAGNOSIS — W19XXXA Unspecified fall, initial encounter: Secondary | ICD-10-CM | POA: Diagnosis not present

## 2022-09-16 DIAGNOSIS — I4891 Unspecified atrial fibrillation: Secondary | ICD-10-CM | POA: Diagnosis not present

## 2022-09-16 DIAGNOSIS — F05 Delirium due to known physiological condition: Secondary | ICD-10-CM | POA: Diagnosis not present

## 2022-09-16 DIAGNOSIS — K8020 Calculus of gallbladder without cholecystitis without obstruction: Secondary | ICD-10-CM | POA: Diagnosis not present

## 2022-09-16 DIAGNOSIS — Z79890 Hormone replacement therapy (postmenopausal): Secondary | ICD-10-CM | POA: Diagnosis not present

## 2022-09-16 DIAGNOSIS — I5022 Chronic systolic (congestive) heart failure: Secondary | ICD-10-CM | POA: Diagnosis not present

## 2022-09-16 DIAGNOSIS — I129 Hypertensive chronic kidney disease with stage 1 through stage 4 chronic kidney disease, or unspecified chronic kidney disease: Secondary | ICD-10-CM | POA: Diagnosis not present

## 2022-09-16 DIAGNOSIS — S01111A Laceration without foreign body of right eyelid and periocular area, initial encounter: Secondary | ICD-10-CM | POA: Diagnosis not present

## 2022-09-16 DIAGNOSIS — W19XXXD Unspecified fall, subsequent encounter: Secondary | ICD-10-CM | POA: Diagnosis not present

## 2022-09-16 DIAGNOSIS — H353130 Nonexudative age-related macular degeneration, bilateral, stage unspecified: Secondary | ICD-10-CM | POA: Diagnosis not present

## 2022-09-16 DIAGNOSIS — R1312 Dysphagia, oropharyngeal phase: Secondary | ICD-10-CM | POA: Diagnosis not present

## 2022-09-16 DIAGNOSIS — I5023 Acute on chronic systolic (congestive) heart failure: Secondary | ICD-10-CM | POA: Diagnosis not present

## 2022-09-16 DIAGNOSIS — S0181XA Laceration without foreign body of other part of head, initial encounter: Secondary | ICD-10-CM | POA: Diagnosis not present

## 2022-09-16 DIAGNOSIS — L97522 Non-pressure chronic ulcer of other part of left foot with fat layer exposed: Secondary | ICD-10-CM | POA: Diagnosis not present

## 2022-09-16 DIAGNOSIS — R2689 Other abnormalities of gait and mobility: Secondary | ICD-10-CM | POA: Diagnosis not present

## 2022-09-16 DIAGNOSIS — S61411A Laceration without foreign body of right hand, initial encounter: Secondary | ICD-10-CM | POA: Diagnosis not present

## 2022-09-16 DIAGNOSIS — I110 Hypertensive heart disease with heart failure: Secondary | ICD-10-CM | POA: Diagnosis not present

## 2022-09-16 DIAGNOSIS — G9341 Metabolic encephalopathy: Secondary | ICD-10-CM | POA: Diagnosis not present

## 2022-09-16 DIAGNOSIS — Z8249 Family history of ischemic heart disease and other diseases of the circulatory system: Secondary | ICD-10-CM | POA: Diagnosis not present

## 2022-09-16 DIAGNOSIS — M5032 Other cervical disc degeneration, mid-cervical region, unspecified level: Secondary | ICD-10-CM | POA: Diagnosis not present

## 2022-09-16 DIAGNOSIS — S199XXA Unspecified injury of neck, initial encounter: Secondary | ICD-10-CM | POA: Diagnosis not present

## 2022-09-16 DIAGNOSIS — H4010X0 Unspecified open-angle glaucoma, stage unspecified: Secondary | ICD-10-CM | POA: Diagnosis not present

## 2022-09-16 DIAGNOSIS — L97512 Non-pressure chronic ulcer of other part of right foot with fat layer exposed: Secondary | ICD-10-CM | POA: Diagnosis not present

## 2022-09-16 DIAGNOSIS — K5900 Constipation, unspecified: Secondary | ICD-10-CM | POA: Diagnosis not present

## 2022-09-16 DIAGNOSIS — Z79899 Other long term (current) drug therapy: Secondary | ICD-10-CM | POA: Diagnosis not present

## 2022-09-16 DIAGNOSIS — H548 Legal blindness, as defined in USA: Secondary | ICD-10-CM | POA: Diagnosis not present

## 2022-09-16 DIAGNOSIS — E876 Hypokalemia: Secondary | ICD-10-CM | POA: Diagnosis not present

## 2022-09-16 DIAGNOSIS — R296 Repeated falls: Secondary | ICD-10-CM | POA: Diagnosis not present

## 2022-09-16 DIAGNOSIS — S0990XA Unspecified injury of head, initial encounter: Secondary | ICD-10-CM | POA: Diagnosis not present

## 2022-09-16 DIAGNOSIS — B9689 Other specified bacterial agents as the cause of diseases classified elsewhere: Secondary | ICD-10-CM | POA: Diagnosis not present

## 2022-09-16 DIAGNOSIS — S3992XA Unspecified injury of lower back, initial encounter: Secondary | ICD-10-CM | POA: Diagnosis not present

## 2022-09-16 DIAGNOSIS — H401130 Primary open-angle glaucoma, bilateral, stage unspecified: Secondary | ICD-10-CM | POA: Diagnosis not present

## 2022-09-16 DIAGNOSIS — M25551 Pain in right hip: Secondary | ICD-10-CM | POA: Diagnosis not present

## 2022-09-16 DIAGNOSIS — I493 Ventricular premature depolarization: Secondary | ICD-10-CM | POA: Diagnosis not present

## 2022-09-16 DIAGNOSIS — H353132 Nonexudative age-related macular degeneration, bilateral, intermediate dry stage: Secondary | ICD-10-CM | POA: Diagnosis not present

## 2022-09-16 DIAGNOSIS — G8191 Hemiplegia, unspecified affecting right dominant side: Secondary | ICD-10-CM | POA: Diagnosis not present

## 2022-09-16 DIAGNOSIS — I5082 Biventricular heart failure: Secondary | ICD-10-CM | POA: Diagnosis not present

## 2022-09-16 DIAGNOSIS — F411 Generalized anxiety disorder: Secondary | ICD-10-CM | POA: Diagnosis not present

## 2022-09-16 DIAGNOSIS — J811 Chronic pulmonary edema: Secondary | ICD-10-CM | POA: Diagnosis not present

## 2022-09-16 DIAGNOSIS — Z87898 Personal history of other specified conditions: Secondary | ICD-10-CM | POA: Diagnosis not present

## 2022-09-16 DIAGNOSIS — I69351 Hemiplegia and hemiparesis following cerebral infarction affecting right dominant side: Secondary | ICD-10-CM | POA: Diagnosis not present

## 2022-09-16 DIAGNOSIS — I6529 Occlusion and stenosis of unspecified carotid artery: Secondary | ICD-10-CM | POA: Diagnosis not present

## 2022-09-16 DIAGNOSIS — Y92129 Unspecified place in nursing home as the place of occurrence of the external cause: Secondary | ICD-10-CM | POA: Diagnosis not present

## 2022-09-16 DIAGNOSIS — E785 Hyperlipidemia, unspecified: Secondary | ICD-10-CM | POA: Diagnosis not present

## 2022-09-16 DIAGNOSIS — K649 Unspecified hemorrhoids: Secondary | ICD-10-CM | POA: Diagnosis not present

## 2022-09-16 DIAGNOSIS — S299XXA Unspecified injury of thorax, initial encounter: Secondary | ICD-10-CM | POA: Diagnosis not present

## 2022-09-16 DIAGNOSIS — I480 Paroxysmal atrial fibrillation: Secondary | ICD-10-CM | POA: Diagnosis not present

## 2022-09-16 DIAGNOSIS — F32A Depression, unspecified: Secondary | ICD-10-CM | POA: Diagnosis not present

## 2022-09-16 DIAGNOSIS — G309 Alzheimer's disease, unspecified: Secondary | ICD-10-CM | POA: Diagnosis not present

## 2022-09-16 DIAGNOSIS — Z7901 Long term (current) use of anticoagulants: Secondary | ICD-10-CM | POA: Diagnosis not present

## 2022-09-16 DIAGNOSIS — M150 Primary generalized (osteo)arthritis: Secondary | ICD-10-CM | POA: Diagnosis not present

## 2022-09-16 DIAGNOSIS — M160 Bilateral primary osteoarthritis of hip: Secondary | ICD-10-CM | POA: Diagnosis not present

## 2022-09-16 DIAGNOSIS — J9601 Acute respiratory failure with hypoxia: Secondary | ICD-10-CM | POA: Diagnosis not present

## 2022-09-16 DIAGNOSIS — M47816 Spondylosis without myelopathy or radiculopathy, lumbar region: Secondary | ICD-10-CM | POA: Diagnosis not present

## 2022-09-16 DIAGNOSIS — N3000 Acute cystitis without hematuria: Secondary | ICD-10-CM | POA: Diagnosis not present

## 2022-09-16 DIAGNOSIS — R531 Weakness: Secondary | ICD-10-CM | POA: Diagnosis not present

## 2022-09-16 DIAGNOSIS — M48061 Spinal stenosis, lumbar region without neurogenic claudication: Secondary | ICD-10-CM | POA: Diagnosis not present

## 2022-09-16 DIAGNOSIS — E039 Hypothyroidism, unspecified: Secondary | ICD-10-CM | POA: Diagnosis present

## 2022-09-16 DIAGNOSIS — Z7401 Bed confinement status: Secondary | ICD-10-CM | POA: Diagnosis not present

## 2022-09-16 DIAGNOSIS — N390 Urinary tract infection, site not specified: Secondary | ICD-10-CM | POA: Diagnosis not present

## 2022-09-16 DIAGNOSIS — I700 Atherosclerosis of aorta: Secondary | ICD-10-CM | POA: Diagnosis not present

## 2022-09-16 DIAGNOSIS — G9389 Other specified disorders of brain: Secondary | ICD-10-CM | POA: Diagnosis not present

## 2022-09-16 DIAGNOSIS — W010XXA Fall on same level from slipping, tripping and stumbling without subsequent striking against object, initial encounter: Secondary | ICD-10-CM | POA: Diagnosis present

## 2022-09-16 DIAGNOSIS — L97419 Non-pressure chronic ulcer of right heel and midfoot with unspecified severity: Secondary | ICD-10-CM | POA: Diagnosis not present

## 2022-09-16 DIAGNOSIS — I517 Cardiomegaly: Secondary | ICD-10-CM | POA: Diagnosis not present

## 2022-09-16 DIAGNOSIS — I639 Cerebral infarction, unspecified: Secondary | ICD-10-CM | POA: Diagnosis not present

## 2022-09-16 DIAGNOSIS — S2231XD Fracture of one rib, right side, subsequent encounter for fracture with routine healing: Secondary | ICD-10-CM | POA: Diagnosis not present

## 2022-09-16 DIAGNOSIS — G3184 Mild cognitive impairment, so stated: Secondary | ICD-10-CM | POA: Diagnosis not present

## 2022-09-16 DIAGNOSIS — F22 Delusional disorders: Secondary | ICD-10-CM | POA: Diagnosis not present

## 2022-09-16 DIAGNOSIS — S3991XA Unspecified injury of abdomen, initial encounter: Secondary | ICD-10-CM | POA: Diagnosis not present

## 2022-09-16 DIAGNOSIS — E079 Disorder of thyroid, unspecified: Secondary | ICD-10-CM | POA: Diagnosis not present

## 2022-09-16 DIAGNOSIS — J90 Pleural effusion, not elsewhere classified: Secondary | ICD-10-CM | POA: Diagnosis not present

## 2022-09-16 DIAGNOSIS — M25519 Pain in unspecified shoulder: Secondary | ICD-10-CM | POA: Diagnosis not present

## 2022-09-16 DIAGNOSIS — J189 Pneumonia, unspecified organism: Secondary | ICD-10-CM | POA: Diagnosis not present

## 2022-09-16 DIAGNOSIS — N179 Acute kidney failure, unspecified: Secondary | ICD-10-CM | POA: Diagnosis not present

## 2022-09-16 DIAGNOSIS — F0390 Unspecified dementia without behavioral disturbance: Secondary | ICD-10-CM | POA: Diagnosis not present

## 2022-09-16 DIAGNOSIS — R41841 Cognitive communication deficit: Secondary | ICD-10-CM | POA: Diagnosis not present

## 2022-09-16 DIAGNOSIS — M1990 Unspecified osteoarthritis, unspecified site: Secondary | ICD-10-CM | POA: Diagnosis not present

## 2022-09-16 DIAGNOSIS — Z66 Do not resuscitate: Secondary | ICD-10-CM | POA: Diagnosis not present

## 2022-09-16 DIAGNOSIS — H409 Unspecified glaucoma: Secondary | ICD-10-CM | POA: Diagnosis not present

## 2022-09-16 DIAGNOSIS — E440 Moderate protein-calorie malnutrition: Secondary | ICD-10-CM | POA: Diagnosis not present

## 2022-09-16 DIAGNOSIS — I10 Essential (primary) hypertension: Secondary | ICD-10-CM | POA: Diagnosis not present

## 2022-09-16 DIAGNOSIS — S3993XA Unspecified injury of pelvis, initial encounter: Secondary | ICD-10-CM | POA: Diagnosis not present

## 2022-09-16 DIAGNOSIS — R443 Hallucinations, unspecified: Secondary | ICD-10-CM | POA: Diagnosis not present

## 2022-09-16 DIAGNOSIS — K5730 Diverticulosis of large intestine without perforation or abscess without bleeding: Secondary | ICD-10-CM | POA: Diagnosis not present

## 2022-09-16 DIAGNOSIS — R4189 Other symptoms and signs involving cognitive functions and awareness: Secondary | ICD-10-CM | POA: Diagnosis not present

## 2022-09-16 MED ORDER — APIXABAN 2.5 MG PO TABS: 2.5000 mg | ORAL_TABLET | Freq: Two times a day (BID) | ORAL | 0 refills | Status: DC

## 2022-09-16 MED ORDER — APIXABAN 2.5 MG PO TABS: 2.5000 mg | ORAL_TABLET | Freq: Two times a day (BID) | ORAL | Status: DC

## 2022-09-16 NOTE — Progress Notes (Signed)
Left message with admitting nurse for pt report. Awaiting call back.

## 2022-09-16 NOTE — Progress Notes (Signed)
PROGRESS NOTE    Amanda Cochran  FIE:[id number] DOB: 05-10-1928 DOA: 08/19/2022 PCP: [name], MD   Brief Narrative:  89 y.o. female with medical history significant of open-angle glaucoma of both eyes with legal blindness, CVA with some residual right-sided weakness (uses a cane to ambulate), hyperlipidemia, hypertension, atrial fibrillation on Eliquis, hypothyroidism who was brought to the emergency department after she was found screaming in her house at "intruders."     Patient was felt to be suffering from acute metabolic encephalopathy secondary to possible urinary tract infection.  The hospitalist group was called to assess the patient for admission to the hospital.     Patient was placed on intravenous antibiotics with ceftriaxone.  During this hospitalization, echocardiogram revealed markedly low ejection fraction of 30 to 35% with indeterminate diastolic function and.  Patient did not appear to be in acute heart failure.  Case was discussed with cardiology and home regimen of beta-blocker therapy was titrated.   Doxycycline was added to the ceftriaxone later in the hospitalization due to additional concerns for possible pneumonia.   Over the course of the hospitalization the patient has exhibited bouts of agitation and delusions.  Psychiatry was consulted and felt the patient was likely suffering from residual delirium from her underlying illness and did not believe that the patient was suffering an underlying psychiatric condition.   Patient seen and examined she is teary and tells me that she cannot do much by herself. She wants to know if she can go home. We talked about safety issues, in view of her vision impairment and that she needs 24 hour care. Unfortunately her brother is not a position to help her, her nephew and niece work and unable to provide 24 hour care. Discussed safe disposition with her brother  and nephew, waiting for family to get back .     Assessment & Plan:    Principal Problem:   Acute metabolic encephalopathy Active Problems:   E-coli UTI   Pneumonia of left lower lobe due to infectious organism   Paranoid delusion (HCC)   Chronic systolic CHF (congestive heart failure) (HCC)   Acute renal failure superimposed on stage 3a chronic Cochran disease (HCC)   Atrial fibrillation, chronic (HCC)   Essential hypertension   Hypothyroidism   Hyperlipidemia   Right hemiparesis (HCC)   Left sided lacunar stroke (HCC)   History of CVA (cerebrovascular accident)   MCI (mild cognitive impairment)   Primary open angle glaucoma of both eyes, severe stage   Acute HFrEF (heart failure with reduced ejection fraction) (HCC)   Osteoarthritis  Acute encephalopathy probably secondary to paranoid delusions, in the setting of decline in cognitive status with some progressive confusion and dementia.  Please see detailed note from [name] who has spoken to the patient's brother and the [facility] regarding her mental status.  She was seen by psychiatry recommended to start the patient on Depakote. Work up has been negative so far for infection.  MRI brain  shows Moderate to severe chronic small-vessel disease with scattered areas of old infarction throughout the brain. Psychiatry consulted for capacity evaluation and recommendations given.  I do not think she is safe to go home by herself unless she had 24 hour care at home. She is high fall risk and the next fall might be fatal especially with her vision impairment and her being on eliquis.  She would benefit from SNF placement. Currently waiting for safe disposition and family to make a decision regarding her  disposition.    Vision loss in the left eye.  Chronic blindness in the right eye.  Suspect progression of glaucoma as per [name] with Ophthalmology.  Recommended outpatient follow up with [name].    E coli UTI and possible atypical pneumonia:  Completed the course of antibiotics.   AKI on stage 3a  CKD Creatinine back to baseline.    Chronic systolic CHF: LAST EF is 30 to 35% with indeterminate diastolic dysfunction.  Continue with eliquis, benazepril 20 mg daily and metoprolol 50 mg daily.  She appears euvolemic.   Hypothyroidism:  Continue synthroid 100 mcg daily.    H/o CVA;  Reviewed the MRI brain without contrast.  Legally blind.    Poor Oral intake:  - eating less than 25% of her meals.  -- added megace to help with her appetite.   Hypokalemia: Resolved   DVT prophylaxis: SCDs Start: 08/20/22 0644   Code Status: Full Code  Family Communication:  None present at bedside.  Discussed in length with patient's nephew Thayer Ohm on 09/12/2022.  TOC is in communication with him.  Status is: Inpatient Remains inpatient appropriate because: Patient medically stable, pending placement to SNF.   Estimated body mass index is 21.35 kg/m as calculated from the following:   Height as of this encounter: 5\' 5"  (1.651 m).   Weight as of this encounter: 58.2 kg.    Nutritional Assessment: Body mass index is 21.35 kg/m.Marland Kitchen Seen by dietician.  I agree with the assessment and plan as outlined below: Nutrition Status:        . Skin Assessment: I have examined the patient's skin and I agree with the wound assessment as performed by the wound care RN as outlined below:    Consultants:  Psychiatry-Signed off  Procedures:  Plan  Antimicrobials:  Anti-infectives (From admission, onward)    Start     Dose/Rate Route Frequency Ordered Stop   08/24/22 2200  amoxicillin-clavulanate (AUGMENTIN) 500-125 MG per tablet 1 tablet        1 tablet Oral 2 times daily 08/24/22 1427 08/26/22 2228   08/24/22 1000  amoxicillin-clavulanate (AUGMENTIN) 875-125 MG per tablet 1 tablet  Status:  Discontinued        1 tablet Oral Every 12 hours 08/23/22 1255 08/24/22 1427   08/23/22 1345  doxycycline (VIBRA-TABS) tablet 100 mg  Status:  Discontinued        100 mg Oral Every 12 hours 08/23/22 1255  08/27/22 1425   08/21/22 0800  cefTRIAXone (ROCEPHIN) 1 g in sodium chloride 0.9 % 100 mL IVPB  Status:  Discontinued        1 g 200 mL/hr over 30 Minutes Intravenous Every 24 hours 08/20/22 0644 08/23/22 1255   08/20/22 0600  cefTRIAXone (ROCEPHIN) 1 g in sodium chloride 0.9 % 100 mL IVPB        1 g 200 mL/hr over 30 Minutes Intravenous  Once 08/20/22 0557 08/20/22 1308         Subjective: Patient seen and examined.  Sleepy but arousable.  Appears comfortable.  Not oriented as usual.  Objective: Vitals:   09/15/22 1100 09/15/22 1330 09/16/22 0500 09/16/22 0516  BP: 123/68 (!) 127/59  135/71  Pulse: 65 (!) 48  (!) 55  Resp:  16    Temp:  (!) 97.4 F (36.3 C)  97.7 F (36.5 C)  TempSrc:  Axillary  Oral  SpO2:  95%  97%  Weight:   58.2 kg   Height:  Intake/Output Summary (Last 24 hours) at 09/16/2022 1021 Last data filed at 09/15/2022 1800 Gross per 24 hour  Intake 420 ml  Output --  Net 420 ml   Filed Weights   09/14/22 0500 09/15/22 0500 09/16/22 0500  Weight: 62.9 kg 55.5 kg 58.2 kg    Examination:  General exam: Appears calm and comfortable  Respiratory system: Clear to auscultation. Respiratory effort normal. Cardiovascular system: S1 & S2 heard, RRR. No JVD, murmurs, rubs, gallops or clicks. No pedal edema. Gastrointestinal system: Abdomen is nondistended, soft and nontender. No organomegaly or masses felt. Normal bowel sounds heard. Central nervous system: Alert and oriented x 1. No focal neurological deficits. Extremities: Symmetric 5 x 5 power.  Data Reviewed: I have personally reviewed following labs and imaging studies  CBC: No results for input(s): "WBC", "NEUTROABS", "HGB", "HCT", "MCV", "PLT" in the last 168 hours.  Basic Metabolic Panel: Recent Labs  Lab 09/11/22 0837 09/12/22 0550 09/14/22 0912  NA 141 142 140  K 3.1* 4.4 3.8  CL 105 108 109  CO2 27 27 21*  GLUCOSE 105* 96 83  BUN 22 24* 22  CREATININE 1.20* 1.25* 1.04*  CALCIUM  8.3* 8.5* 8.4*   GFR: Estimated Creatinine Clearance: 31.1 mL/min (A) (by C-G formula based on SCr of 1.04 mg/dL (H)). Liver Function Tests: No results for input(s): "AST", "ALT", "ALKPHOS", "BILITOT", "PROT", "ALBUMIN" in the last 168 hours.  No results for input(s): "LIPASE", "AMYLASE" in the last 168 hours. No results for input(s): "AMMONIA" in the last 168 hours.  Coagulation Profile: No results for input(s): "INR", "PROTIME" in the last 168 hours. Cardiac Enzymes: No results for input(s): "CKTOTAL", "CKMB", "CKMBINDEX", "TROPONINI" in the last 168 hours. BNP (last 3 results) No results for input(s): "PROBNP" in the last 8760 hours. HbA1C: No results for input(s): "HGBA1C" in the last 72 hours. CBG: No results for input(s): "GLUCAP" in the last 168 hours. Lipid Profile: No results for input(s): "CHOL", "HDL", "LDLCALC", "TRIG", "CHOLHDL", "LDLDIRECT" in the last 72 hours. Thyroid Function Tests: No results for input(s): "TSH", "T4TOTAL", "FREET4", "T3FREE", "THYROIDAB" in the last 72 hours. Anemia Panel: No results for input(s): "VITAMINB12", "FOLATE", "FERRITIN", "TIBC", "IRON", "RETICCTPCT" in the last 72 hours. Sepsis Labs: No results for input(s): "PROCALCITON", "LATICACIDVEN" in the last 168 hours.  No results found for this or any previous visit (from the past 240 hour(s)).   Radiology Studies: No results found.  Scheduled Meds:  apixaban  5 mg Oral BID   benazepril  20 mg Oral Daily   cholecalciferol  2,000 Units Oral Daily   divalproex  125 mg Oral Q8H   dorzolamide-timolol  1 drop Both Eyes BID   feeding supplement  237 mL Oral TID BM   latanoprost  1 drop Both Eyes QHS   levothyroxine  100 mcg Oral Q0600   megestrol  400 mg Oral Daily   metoprolol succinate  50 mg Oral Daily   pilocarpine  1 drop Both Eyes QID   thiamine  100 mg Oral Daily   Continuous Infusions:   LOS: 26 days   Hughie Closs, MD [facility]  09/16/2022, 10:21 AM   *Please  note that this is a verbal dictation therefore any spelling or grammatical errors are due to the "[employer] One" system interpretation.  Please page via Amion and do not message via secure chat for urgent patient care matters. Secure chat can be used for non urgent patient care matters.  How to contact the [facility] Attending  or Consulting provider 7A - 7P or covering provider during after hours 7P -7A, for this patient?  Check the care team in [facility] and look for a) attending/consulting [facility] provider listed and b) the [facility] team listed. Page or secure chat 7A-7P. Log into [email or web address] and use [place]'s universal password to access. If you do not have the password, please contact the hospital operator. Locate the [facility] provider you are looking for under [facility] and page to a number that you can be directly reached. If you still have difficulty reaching the provider, please page the [facility] (Director on Call) for the Hospitalists listed on amion for assistance.

## 2022-09-16 NOTE — Progress Notes (Signed)
PMT no charge note.   Chart reviewed, note plans for discharge today.  Palliative medicine team has been following peripherally with patient's medical care. Hospital course complicated by agitation at times. TOC working with patient's nephew to coordinate discharge to rehab facility. Awaiting possible discharge today Recommend involvement of outpatient palliative services at her facility for ongoing additional support.  Rosalin Hawking MD  palliative.

## 2022-09-16 NOTE — Progress Notes (Deleted)
Attempted to call report again, unable to reach RN. Message left with admission nurse.

## 2022-09-16 NOTE — Discharge Summary (Signed)
Physician Discharge Summary  Amanda Cochran WUX:[id number] DOB: April 07, 1928 DOA: 08/19/2022  PCP: [name], MD  Admit date: 08/19/2022 Discharge date: 09/16/2022 30 Day Unplanned Readmission Risk Score    Flowsheet Row ED to Hosp-Admission (Current) from 08/19/2022 in [facility]  30 Day Unplanned Readmission Risk Score (%) 17.93 Filed at 09/16/2022 1200       This score is the patient's risk of an unplanned readmission within 30 days of being discharged (0 -100%). The score is based on dignosis, age, lab data, medications, orders, and past utilization.   Low:  0-14.9   Medium: 15-21.9   High: 22-29.9   Extreme: 30 and above          Admitted From: Home Disposition: SNF  Recommendations for Outpatient Follow-up:  Follow up with PCP in 1-2 weeks Please obtain BMP/CBC in one week Please follow up with your PCP on the following pending results: Unresulted Labs (From admission, onward)    None         Home Health: None Equipment/Devices: None  Discharge Condition: Stable CODE STATUS: Full code Diet recommendation: Cardiac  Subjective: Seen and examined, Sleepy but arousable.  Alert but oriented to self only.  This is her baseline.   Brief/Interim Summary: 89 y.o. female with medical history significant of open-angle glaucoma of both eyes with legal blindness, CVA with some residual right-sided weakness (uses a cane to ambulate), hyperlipidemia, hypertension, atrial fibrillation on Eliquis, hypothyroidism who was brought to the emergency department after she was found screaming in her house at "intruders."     Patient was felt to be suffering from acute metabolic encephalopathy secondary to possible urinary tract infection.  The hospitalist group was called to assess the patient for admission to the hospital.     Patient was placed on intravenous antibiotics with ceftriaxone.  During this hospitalization, echocardiogram revealed markedly low ejection fraction  of 30 to 35% with indeterminate diastolic function and.  Patient did not appear to be in acute heart failure.  Case was discussed with cardiology and home regimen of beta-blocker therapy was titrated.   Doxycycline was added to the ceftriaxone later in the hospitalization due to additional concerns for possible pneumonia.   Over the course of the hospitalization the patient has exhibited bouts of agitation and delusions.  Psychiatry was consulted and felt the patient was likely suffering from residual delirium from her underlying illness and did not believe that the patient was suffering an underlying psychiatric condition.  Acute encephalopathy probably secondary to paranoid delusions, in the setting of decline in cognitive status with some progressive confusion and dementia.  Please see detailed note from [name] who has spoken to the patient's brother and the [facility] regarding her mental status.  She was seen by psychiatry recommended to start the patient on Depakote. Work up has been negative so far for infection.  MRI brain  shows Moderate to severe chronic small-vessel disease with scattered areas of old infarction throughout the brain. Psychiatry consulted for capacity evaluation and recommendations given.  She was not considered safe to go home by herself unless she had 24 hour care at home. She is high fall risk and the next fall might be fatal especially with her vision impairment and her being on eliquis.  We thought that she would benefit from SNF which has been arranged for her so she is going to be discharged today.   Vision loss in the left eye.  Chronic blindness in the right  eye.  Suspect progression of glaucoma as per [name] with Ophthalmology.  Recommended outpatient follow up with [name].    E coli UTI and possible atypical pneumonia:  Completed the course of antibiotics.   AKI on stage 3a CKD Creatinine back to baseline.    Chronic systolic CHF: LAST EF is 30 to  35% with indeterminate diastolic dysfunction.  Continue with eliquis, benazepril 20 mg daily and metoprolol 50 mg daily.  She appears euvolemic.    Hypothyroidism:  Continue synthroid 100 mcg daily.    H/o CVA;  Reviewed the MRI brain without contrast.  Legally blind.    Poor Oral intake:  - eating less than 25% of her meals.  -- added megace to help with her appetite.    Hypokalemia: Resolved  Hypertension: Amlodipine being discontinued since blood pressure remained stable on benazepril.  Paroxysmal atrial fibrillation: Rate controlled.  Due to her age and renal function, her Eliquis is being reduced to 2.5 mg p.o. twice daily.  Discharge plan was discussed with patient and/or family member and they verbalized understanding and agreed with it.  Discharge Diagnoses:  Principal Problem:   Acute metabolic encephalopathy Active Problems:   E-coli UTI   Pneumonia of left lower lobe due to infectious organism   Paranoid delusion (HCC)   Chronic systolic CHF (congestive heart failure) (HCC)   Acute renal failure superimposed on stage 3a chronic kidney disease (HCC)   Atrial fibrillation, chronic (HCC)   Essential hypertension   Hypothyroidism   Hyperlipidemia   Right hemiparesis (HCC)   Left sided lacunar stroke (HCC)   History of CVA (cerebrovascular accident)   MCI (mild cognitive impairment)   Primary open angle glaucoma of both eyes, severe stage   Acute HFrEF (heart failure with reduced ejection fraction) (HCC)   Osteoarthritis    Discharge Instructions   Allergies as of 09/16/2022   No Known Allergies      Medication List     STOP taking these medications    amLODipine 5 MG tablet Commonly known as: NORVASC   atorvastatin 40 MG tablet Commonly known as: LIPITOR   hydrALAZINE 25 MG tablet Commonly known as: APRESOLINE   Vitamin D 50 MCG (2000 UT) Caps       TAKE these medications    apixaban 2.5 MG Tabs tablet Commonly known as: ELIQUIS Take 1  tablet (2.5 mg total) by mouth 2 (two) times daily. What changed:  medication strength how much to take   benazepril 20 MG tablet Commonly known as: LOTENSIN Take 20 mg by mouth daily.   CENTRUM SILVER PO Take 1 tablet by mouth daily.   divalproex 125 MG capsule Commonly known as: DEPAKOTE SPRINKLE Take 1 capsule (125 mg total) by mouth every 8 (eight) hours.   feeding supplement Liqd Take 237 mLs by mouth 3 (three) times daily between meals.   Fish Oil 1000 MG Caps Take 1,000 mg by mouth daily.   latanoprost 0.005 % ophthalmic solution Commonly known as: XALATAN Place 1 drop into both eyes at bedtime.   levothyroxine 100 MCG tablet Commonly known as: SYNTHROID Take 1 tablet (100 mcg total) by mouth daily. What changed: when to take this   metoprolol succinate 50 MG 24 hr tablet Commonly known as: TOPROL-XL Take 1 tablet (50 mg total) by mouth daily. Take with or immediately following a meal.   pilocarpine 1 % ophthalmic solution Commonly known as: PILOCAR Place 1 drop into both eyes 4 (four) times daily.  timolol 0.5 % ophthalmic solution Commonly known as: TIMOPTIC Place 1 drop into both eyes daily.   VITAMIN C PO Take 1 tablet by mouth daily.        Contact information for follow-up providers     Care, [facility] Follow up.   Specialty: Home Health Services Why: Your home health has been set up with [facility]. The office will call you with start of service information. If you have any questions or concerns please call the number listed above. Contact information: [address] [place] [address] [phone]         [name], MD Follow up in 1 week(s).   Specialty: Internal Medicine Contact information: [address] [place] [address] [phone]              Contact information for after-discharge care     Destination     [facility] Preferred SNF .   Service: Skilled  Nursing Contact information: [address] [place] [address] [phone]                    No Known Allergies  Consultations: Psychiatry, ophthalmology, palliative care   Procedures/Studies: DG CHEST PORT 1 VIEW  Result Date: 09/02/2022 CLINICAL DATA:  Dyspnea EXAM: PORTABLE CHEST 1 VIEW COMPARISON:  08/23/2022 FINDINGS: Cardiac shadow is enlarged but stable. Aortic calcifications are again seen. Lungs are well aerated bilaterally. No focal infiltrate or effusion is seen. No bony abnormality is noted. IMPRESSION: No active disease. Electronically Signed   By: Alcide Clever M.D.   On: 09/02/2022 11:28   MR BRAIN WO CONTRAST  Result Date: 09/01/2022 CLINICAL DATA:  Delirium; Vision loss, known etiology. EXAM: MRI HEAD WITHOUT CONTRAST MRI ORBITS WITHOUT AND WITH CONTRAST TECHNIQUE: Multiplanar, multiecho pulse sequences of the brain and surrounding structures were obtained without intravenous contrast. Multiplanar, multiecho pulse sequences of the orbits and surrounding structures were obtained including fat saturation techniques, before and after intravenous contrast administration. CONTRAST:  6mL GADAVIST GADOBUTROL 1 MMOL/ML IV SOLN COMPARISON:  Head CT 08/19/2022. FINDINGS: MRI HEAD FINDINGS Brain: No acute infarct or hemorrhage. Scattered areas old infarction in the left posterior limb of the internal capsule and corona radiata, bilateral occipital lobes, bilateral temporal lobes, and inferior right parietal lobe. Underlying moderate to severe chronic small-vessel disease. No acute hydrocephalus or extra-axial collection. Vascular: Normal flow voids. Skull and upper cervical spine: Normal marrow signal. Other: None. MRI ORBITS FINDINGS Orbits: No traumatic or inflammatory finding. Globes, optic nerves, orbital fat, extraocular muscles, vascular structures, and lacrimal glands are normal. Proteinaceous cystic lesion in the left medial canthus along the proximal left  lacrimal duct, likely sequela of chronic dacryocystitis. Visualized sinuses: Unremarkable. Soft tissues: Unremarkable. IMPRESSION: 1. No acute intracranial abnormality or mass. 2. No acute abnormality in the orbits. 3. Moderate to severe chronic small-vessel disease with scattered areas of old infarction throughout the brain. 4. Proteinaceous cystic lesion in the left medial canthus along the proximal left lacrimal duct, likely sequela of chronic dacryocystitis. Electronically Signed   By: Orvan Falconer M.D.   On: 09/01/2022 16:18   MR ORBITS W WO CONTRAST  Result Date: 09/01/2022 CLINICAL DATA:  Delirium; Vision loss, known etiology. EXAM: MRI HEAD WITHOUT CONTRAST MRI ORBITS WITHOUT AND WITH CONTRAST TECHNIQUE: Multiplanar, multiecho pulse sequences of the brain and surrounding structures were obtained without intravenous contrast. Multiplanar, multiecho pulse sequences of the orbits and surrounding structures were obtained including fat saturation techniques, before and  after intravenous contrast administration. CONTRAST:  6mL GADAVIST GADOBUTROL 1 MMOL/ML IV SOLN COMPARISON:  Head CT 08/19/2022. FINDINGS: MRI HEAD FINDINGS Brain: No acute infarct or hemorrhage. Scattered areas old infarction in the left posterior limb of the internal capsule and corona radiata, bilateral occipital lobes, bilateral temporal lobes, and inferior right parietal lobe. Underlying moderate to severe chronic small-vessel disease. No acute hydrocephalus or extra-axial collection. Vascular: Normal flow voids. Skull and upper cervical spine: Normal marrow signal. Other: None. MRI ORBITS FINDINGS Orbits: No traumatic or inflammatory finding. Globes, optic nerves, orbital fat, extraocular muscles, vascular structures, and lacrimal glands are normal. Proteinaceous cystic lesion in the left medial canthus along the proximal left lacrimal duct, likely sequela of chronic dacryocystitis. Visualized sinuses: Unremarkable. Soft tissues:  Unremarkable. IMPRESSION: 1. No acute intracranial abnormality or mass. 2. No acute abnormality in the orbits. 3. Moderate to severe chronic small-vessel disease with scattered areas of old infarction throughout the brain. 4. Proteinaceous cystic lesion in the left medial canthus along the proximal left lacrimal duct, likely sequela of chronic dacryocystitis. Electronically Signed   By: Orvan Falconer M.D.   On: 09/01/2022 16:18   DG Abd 1 View  Result Date: 09/01/2022 CLINICAL DATA:  Delirium.  Pre MRI screening. EXAM: ABDOMEN - 1 VIEW COMPARISON:  01/06/2022 FINDINGS: Single view of the abdomen was obtained. Nonobstructive bowel gas pattern. No surgical hardware in the lumbar spine or lower thoracic spine. Cardiac silhouette is prominent based on this examination. Atherosclerotic calcifications involving descending thoracic aorta. Degenerative changes in lower lumbar spine. No evidence for a radiopaque foreign body. Rounded density on the right side of the abdomen may be above the kidney but difficult to exclude a renal calculus or even a calcified gallstone. IMPRESSION: 1. No evidence for a radiopaque or metallic foreign body. 2. Nonobstructive bowel gas pattern. 3. Possible right renal calculus or gallstone. Electronically Signed   By: Richarda Overlie M.D.   On: 09/01/2022 09:44   DG Neck Soft Tissue  Result Date: 09/01/2022 CLINICAL DATA:  Delirium.  Pre MRI screening. EXAM: NECK SOFT TISSUES - 1+ VIEW COMPARISON:  Head CT 08/19/2022 FINDINGS: AP view of the cervical spine demonstrates dental fillings and dental hardware. Prominent calcifications along the right side of the neck are likely associated with the carotid arteries. Evidence for multilevel degenerative changes in the cervical spine. No evidence for surgical hardware in the cervical spine. Scarring at both lung apices. IMPRESSION: 1. No evidence for surgical hardware in the cervical spine. 2. Prominent calcifications along the right side of the neck  are likely associated with the carotid arteries. Electronically Signed   By: Richarda Overlie M.D.   On: 09/01/2022 09:40   DG Pelvis 1-2 Views  Result Date: 09/01/2022 CLINICAL DATA:  Delirium.  Pre MRI screening. EXAM: PELVIS - 1-2 VIEW COMPARISON:  None Available. FINDINGS: Pelvic bony structures are intact. Lucency overlying the left inferior pubic ramus probably related to Mach line. No definite acute fracture or dislocation. Osteoarthritis in both hips particularly in the right superolateral hip. No evidence for a radiopaque foreign body. IMPRESSION: 1. No evidence for a radiopaque or metallic foreign body. Electronically Signed   By: Richarda Overlie M.D.   On: 09/01/2022 09:37   DG Chest 1 View  Result Date: 08/23/2022 CLINICAL DATA:  89 year old female with history of pneumonia. Shortness of breath. EXAM: CHEST  1 VIEW COMPARISON:  Chest x-ray 04/05/2013. FINDINGS: Poorly defined opacity at the left base partially obscuring the left hemidiaphragm, compatible with  atelectasis and/or consolidation, likely with superimposed small left pleural effusion. Right lung is clear. No right pleural effusion. No pneumothorax. No evidence of pulmonary edema. Heart size is mildly enlarged. Upper mediastinal contours are within normal limits. IMPRESSION: 1. Atelectasis and/or consolidation in the left lower lobe. 2. Small left pleural effusion. 3. Mild cardiomegaly. 4. Aortic atherosclerosis. Electronically Signed   By: Trudie Reed M.D.   On: 08/23/2022 12:41   ECHOCARDIOGRAM COMPLETE  Result Date: 08/20/2022    ECHOCARDIOGRAM REPORT   Patient Name:   Amanda Cochran Date of Exam: 08/20/2022 Medical Rec #:  [id number]    Height:       65.0 in Accession #:    [id number]   Weight:       136.9 lb Date of Birth:  Jun 29, 1928    BSA:          1.684 m Patient Age:    89 years     BP:           153/82 mmHg Patient Gender: F            HR:           70 bpm. Exam Location:  Inpatient Procedure: 2D Echo, Cardiac Doppler and Color  Doppler Indications:    Arrhythmia  History:        Patient has prior history of Echocardiogram examinations, most                 recent 01/13/2019. Stroke; Risk Factors:Hypertension.  Sonographer:    Darlys Gales Referring Phys: [id number] DAVID MANUEL ORTIZ IMPRESSIONS  1. Wall motion difficult to assess due to incomplete visualization of the LV endocardium but appears globally hypokinetic on limited view. Left ventricular ejection fraction, by estimation, is 30 to 35%. The left ventricle has moderately decreased function. The left ventricular internal cavity size was mildly dilated. Left ventricular diastolic parameters are indeterminate.  2. Right ventricular systolic function is moderately reduced. The right ventricular size is mildly enlarged. There is mildly elevated pulmonary artery systolic pressure. The estimated right ventricular systolic pressure is 39.2 mmHg.  3. Left atrial size was severely dilated.  4. Right atrial size was severely dilated.  5. The mitral valve is grossly normal. Moderate mitral valve regurgitation.  6. The aortic valve is tricuspid. There is mild calcification of the aortic valve. There is mild thickening of the aortic valve. Aortic valve regurgitation is trivial. Aortic valve sclerosis/calcification is present, without any evidence of aortic stenosis.  7. The inferior vena cava is dilated in size with <50% respiratory variability, suggesting right atrial pressure of 15 mmHg. Comparison(s): Compared to prior TTE in 2020, the EF has dropped from 50-55% to 30-35%. There is now functional, moderate MR. RV systolic function also now appears reduced. FINDINGS  Left Ventricle: Wall motion difficult to assess due to incomplete visualization of the LV endocardium but appears globally hypokinetic on limited view. Left ventricular ejection fraction, by estimation, is 30 to 35%. The left ventricle has moderately decreased function. The left ventricular internal cavity size was mildly dilated.  There is no left ventricular hypertrophy. Left ventricular diastolic parameters are indeterminate. Right Ventricle: The right ventricular size is mildly enlarged. Right vetricular wall thickness was not well visualized. Right ventricular systolic function is moderately reduced. There is mildly elevated pulmonary artery systolic pressure. The tricuspid  regurgitant velocity is 2.46 m/s, and with an assumed right atrial pressure of 15 mmHg, the estimated right ventricular systolic pressure is 39.2 mmHg. Left Atrium:  Left atrial size was severely dilated. Right Atrium: Right atrial size was severely dilated. Pericardium: There is no evidence of pericardial effusion. Mitral Valve: The mitral valve is grossly normal. Moderate mitral valve regurgitation. Tricuspid Valve: The tricuspid valve is normal in structure. Tricuspid valve regurgitation is mild. Aortic Valve: The aortic valve is tricuspid. There is mild calcification of the aortic valve. There is mild thickening of the aortic valve. Aortic valve regurgitation is trivial. Aortic regurgitation PHT measures 508 msec. Aortic valve sclerosis/calcification is present, without any evidence of aortic stenosis. Aortic valve mean gradient measures 2.0 mmHg. Aortic valve peak gradient measures 2.8 mmHg. Aortic valve area, by VTI measures 2.00 cm. Pulmonic Valve: The pulmonic valve was not well visualized. Pulmonic valve regurgitation is trivial. Aorta: The aortic root is normal in size and structure. Venous: The inferior vena cava is dilated in size with less than 50% respiratory variability, suggesting right atrial pressure of 15 mmHg. IAS/Shunts: The atrial septum is grossly normal.  LEFT VENTRICLE PLAX 2D LVIDd:         5.60 cm   Diastology LVIDs:         5.00 cm   LV e' medial:   3.15 cm/s LV PW:         0.80 cm   LV E/e' medial: 28.4 LV IVS:        0.70 cm LVOT diam:     1.70 cm LV SV:         27 LV SV Index:   16 LVOT Area:     2.27 cm  LEFT ATRIUM              Index  LA Vol (A2C):   69.6 ml  41.33 ml/m LA Vol (A4C):   120.0 ml 71.27 ml/m LA Biplane Vol: 95.9 ml  56.95 ml/m  AORTIC VALVE AV Area (Vmax):    1.57 cm AV Area (Vmean):   1.72 cm AV Area (VTI):     2.00 cm AV Vmax:           83.20 cm/s AV Vmean:          59.200 cm/s AV VTI:            0.134 m AV Peak Grad:      2.8 mmHg AV Mean Grad:      2.0 mmHg LVOT Vmax:         57.40 cm/s LVOT Vmean:        44.800 cm/s LVOT VTI:          0.118 m LVOT/AV VTI ratio: 0.88 AI PHT:            508 msec  AORTA Ao Root diam: 1.80 cm Ao Asc diam:  2.50 cm MITRAL VALVE               TRICUSPID VALVE MV Area (PHT): 6.48 cm    TR Peak grad:   24.2 mmHg MV Decel Time: 117 msec    TR Vmax:        246.00 cm/s MV E velocity: 89.50 cm/s                            SHUNTS                            Systemic VTI:  0.12 m  Systemic Diam: 1.70 cm Laurance Flatten MD Electronically signed by Laurance Flatten MD Signature Date/Time: 08/20/2022/2:42:40 PM    Final    CT Head Wo Contrast  Result Date: 08/19/2022 CLINICAL DATA:  Mental status change EXAM: CT HEAD WITHOUT CONTRAST TECHNIQUE: Contiguous axial images were obtained from the base of the skull through the vertex without intravenous contrast. RADIATION DOSE REDUCTION: This exam was performed according to the departmental dose-optimization program which includes automated exposure control, adjustment of the mA and/or kV according to patient size and/or use of iterative reconstruction technique. COMPARISON:  01/12/2019 CT head FINDINGS: Brain: No evidence of acute infarction, hemorrhage, mass, mass effect, or midline shift. No hydrocephalus or extra-axial fluid collection. Redemonstrated remote right parietal and left basal ganglia infarcts. Periventricular white matter changes, likely the sequela of chronic small vessel ischemic disease. Vascular: No hyperdense vessel. Atherosclerotic calcifications in the intracranial carotid and vertebral arteries. Skull:  Negative for fracture or focal lesion. Sinuses/Orbits: Mucosal thickening in the ethmoid air cells. Status post bilateral lens replacements. Other: None. IMPRESSION: No acute intracranial process. Electronically Signed   By: Wiliam Ke M.D.   On: 08/19/2022 19:28     Discharge Exam: Vitals:   09/16/22 0516 09/16/22 1329  BP: 135/71 138/76  Pulse: (!) 55 69  Resp:  20  Temp: 97.7 F (36.5 C) 97.7 F (36.5 C)  SpO2: 97% 97%   Vitals:   09/16/22 0500 09/16/22 0516 09/16/22 1100 09/16/22 1329  BP:  135/71  138/76  Pulse:  (!) 55  69  Resp:    20  Temp:  97.7 F (36.5 C)  97.7 F (36.5 C)  TempSrc:  Oral  Oral  SpO2:  97%  97%  Weight: 58.2 kg  57 kg   Height:        General: Pt is alert, awake, not in acute distress Cardiovascular: RRR, S1/S2 +, no rubs, no gallops Respiratory: CTA bilaterally, no wheezing, no rhonchi Abdominal: Soft, NT, ND, bowel sounds + Extremities: no edema, no cyanosis    The results of significant diagnostics from this hospitalization (including imaging, microbiology, ancillary and laboratory) are listed below for reference.     Microbiology: No results found for this or any previous visit (from the past 240 hour(s)).   Labs: BNP (last 3 results) No results for input(s): "BNP" in the last 8760 hours. Basic Metabolic Panel: Recent Labs  Lab 09/11/22 0837 09/12/22 0550 09/14/22 0912  NA 141 142 140  K 3.1* 4.4 3.8  CL 105 108 109  CO2 27 27 21*  GLUCOSE 105* 96 83  BUN 22 24* 22  CREATININE 1.20* 1.25* 1.04*  CALCIUM 8.3* 8.5* 8.4*   Liver Function Tests: No results for input(s): "AST", "ALT", "ALKPHOS", "BILITOT", "PROT", "ALBUMIN" in the last 168 hours. No results for input(s): "LIPASE", "AMYLASE" in the last 168 hours. No results for input(s): "AMMONIA" in the last 168 hours. CBC: No results for input(s): "WBC", "NEUTROABS", "HGB", "HCT", "MCV", "PLT" in the last 168 hours. Cardiac Enzymes: No results for input(s): "CKTOTAL",  "CKMB", "CKMBINDEX", "TROPONINI" in the last 168 hours. BNP: Invalid input(s): "POCBNP" CBG: No results for input(s): "GLUCAP" in the last 168 hours. D-Dimer No results for input(s): "DDIMER" in the last 72 hours. Hgb A1c No results for input(s): "HGBA1C" in the last 72 hours. Lipid Profile No results for input(s): "CHOL", "HDL", "LDLCALC", "TRIG", "CHOLHDL", "LDLDIRECT" in the last 72 hours. Thyroid function studies No results for input(s): "TSH", "T4TOTAL", "T3FREE", "THYROIDAB" in the last 72  hours.  Invalid input(s): "FREET3" Anemia work up No results for input(s): "VITAMINB12", "FOLATE", "FERRITIN", "TIBC", "IRON", "RETICCTPCT" in the last 72 hours. Urinalysis    Component Value Date/Time   COLORURINE YELLOW 08/20/2022 0440   APPEARANCEUR CLEAR 08/20/2022 0440   LABSPEC 1.023 08/20/2022 0440   PHURINE 5.0 08/20/2022 0440   GLUCOSEU NEGATIVE 08/20/2022 0440   HGBUR NEGATIVE 08/20/2022 0440   BILIRUBINUR NEGATIVE 08/20/2022 0440   KETONESUR 5 (A) 08/20/2022 0440   PROTEINUR 30 (A) 08/20/2022 0440   UROBILINOGEN 0.2 02/06/2014 2331   NITRITE POSITIVE (A) 08/20/2022 0440   LEUKOCYTESUR NEGATIVE 08/20/2022 0440   Sepsis Labs No results for input(s): "WBC" in the last 168 hours.  Invalid input(s): "PROCALCITONIN", "LACTICIDVEN" Microbiology No results found for this or any previous visit (from the past 240 hour(s)).  FURTHER DISCHARGE INSTRUCTIONS:   Get Medicines reviewed and adjusted: Please take all your medications with you for your next visit with your Primary MD   Laboratory/radiological data: Please request your Primary MD to go over all hospital tests and procedure/radiological results at the follow up, please ask your Primary MD to get all Hospital records sent to his/her office.   In some cases, they will be blood work, cultures and biopsy results pending at the time of your discharge. Please request that your primary care M.D. goes through all the records of  your hospital data and follows up on these results.   Also Note the following: If you experience worsening of your admission symptoms, develop shortness of breath, life threatening emergency, suicidal or homicidal thoughts you must seek medical attention immediately by calling 911 or calling your MD immediately  if symptoms less severe.   You must read complete instructions/literature along with all the possible adverse reactions/side effects for all the Medicines you take and that have been prescribed to you. Take any new Medicines after you have completely understood and accpet all the possible adverse reactions/side effects.    Do not drive when taking Pain medications or sleeping medications (Benzodaizepines)   Do not take more than prescribed Pain, Sleep and Anxiety Medications. It is not advisable to combine anxiety,sleep and pain medications without talking with your primary care practitioner   Special Instructions: If you have smoked or chewed Tobacco  in the last 2 yrs please stop smoking, stop any regular Alcohol  and or any Recreational drug use.   Wear Seat belts while driving.   Please note: You were cared for by a hospitalist during your hospital stay. Once you are discharged, your primary care physician will handle any further medical issues. Please note that NO REFILLS for any discharge medications will be authorized once you are discharged, as it is imperative that you return to your primary care physician (or establish a relationship with a primary care physician if you do not have one) for your post hospital discharge needs so that they can reassess your need for medications and monitor your lab values  Time coordinating discharge: Over 30 minutes  SIGNED:   Hughie Closs, MD  [facility] 09/16/2022, 1:40 PM *Please note that this is a verbal dictation therefore any spelling or grammatical errors are due to the "Dragon Medical One" system interpretation. If 7PM-7AM,  please contact night-coverage [email or web address]

## 2022-09-16 NOTE — Plan of Care (Signed)
Pt alert and oriented to self and birthday, confusion to location and time.  Pt with some anxiety regarding her home, believing someone has stolen her home.  Pt resting quietly throughout shift, ambulating with assistance and front wheel walker to toilet. Pt with good oral intake, likes strawberry ensure the best and drank entire carton. Pt wishes to speak with brother, attempted to call without success.

## 2022-09-16 NOTE — Progress Notes (Signed)
Report called to Sheila RN.

## 2022-09-16 NOTE — TOC Transition Note (Signed)
Transition of Care [facility]) - CM/SW Discharge Note   Patient Details  Name: Amanda Cochran MRN: [id number] Date of Birth: 09-09-1928  Transition of Care (TOC) CM/SW Contact:  Armanda Heritage, RN Phone Number: 09/16/2022, 1:45 PM   Clinical Narrative:     Patient will discharge to [facility] today, nephew has completed all required paperwork per rep.  DC summary faxed to facility, patient will discharge to room 3217, report number [phone].  [facility] transport arranged.  Final next level of care: Skilled Nursing Facility Barriers to Discharge: ED Unsafe disposition   Patient Goals and CMS Choice      Discharge Placement                         Discharge Plan and Services Additional resources added to the After Visit Summary for     Discharge Planning Services: CM Consult                                 Social Determinants of Health (SDOH) Interventions SDOH Screenings   Food Insecurity: No Food Insecurity (08/21/2022)  Housing: Low Risk  (08/21/2022)  Transportation Needs: No Transportation Needs (08/21/2022)  Recent Concern: Transportation Needs - Unmet Transportation Needs (08/20/2022)  Utilities: Not At Risk (08/21/2022)  Tobacco Use: Low Risk  (08/20/2022)     Readmission Risk Interventions     No data to display

## 2022-09-17 DIAGNOSIS — E039 Hypothyroidism, unspecified: Secondary | ICD-10-CM | POA: Diagnosis not present

## 2022-09-17 DIAGNOSIS — G8191 Hemiplegia, unspecified affecting right dominant side: Secondary | ICD-10-CM | POA: Diagnosis not present

## 2022-09-17 DIAGNOSIS — H409 Unspecified glaucoma: Secondary | ICD-10-CM | POA: Diagnosis not present

## 2022-09-17 DIAGNOSIS — M150 Primary generalized (osteo)arthritis: Secondary | ICD-10-CM | POA: Diagnosis not present

## 2022-09-17 DIAGNOSIS — N1831 Chronic kidney disease, stage 3a: Secondary | ICD-10-CM | POA: Diagnosis not present

## 2022-09-17 DIAGNOSIS — I639 Cerebral infarction, unspecified: Secondary | ICD-10-CM | POA: Diagnosis not present

## 2022-09-17 DIAGNOSIS — I129 Hypertensive chronic kidney disease with stage 1 through stage 4 chronic kidney disease, or unspecified chronic kidney disease: Secondary | ICD-10-CM | POA: Diagnosis not present

## 2022-09-17 DIAGNOSIS — I5022 Chronic systolic (congestive) heart failure: Secondary | ICD-10-CM | POA: Diagnosis not present

## 2022-09-17 DIAGNOSIS — E785 Hyperlipidemia, unspecified: Secondary | ICD-10-CM | POA: Diagnosis not present

## 2022-09-17 DIAGNOSIS — I4891 Unspecified atrial fibrillation: Secondary | ICD-10-CM | POA: Diagnosis not present

## 2022-09-17 DIAGNOSIS — G3184 Mild cognitive impairment, so stated: Secondary | ICD-10-CM | POA: Diagnosis not present

## 2022-09-17 DIAGNOSIS — N390 Urinary tract infection, site not specified: Secondary | ICD-10-CM | POA: Diagnosis not present

## 2022-09-18 DIAGNOSIS — I480 Paroxysmal atrial fibrillation: Secondary | ICD-10-CM | POA: Diagnosis not present

## 2022-09-18 DIAGNOSIS — E039 Hypothyroidism, unspecified: Secondary | ICD-10-CM | POA: Diagnosis not present

## 2022-09-18 DIAGNOSIS — G309 Alzheimer's disease, unspecified: Secondary | ICD-10-CM | POA: Diagnosis not present

## 2022-09-22 DIAGNOSIS — I129 Hypertensive chronic kidney disease with stage 1 through stage 4 chronic kidney disease, or unspecified chronic kidney disease: Secondary | ICD-10-CM | POA: Diagnosis not present

## 2022-09-22 DIAGNOSIS — E440 Moderate protein-calorie malnutrition: Secondary | ICD-10-CM | POA: Diagnosis not present

## 2022-09-22 DIAGNOSIS — I4891 Unspecified atrial fibrillation: Secondary | ICD-10-CM | POA: Diagnosis not present

## 2022-09-22 DIAGNOSIS — I5022 Chronic systolic (congestive) heart failure: Secondary | ICD-10-CM | POA: Diagnosis not present

## 2022-09-22 DIAGNOSIS — G3184 Mild cognitive impairment, so stated: Secondary | ICD-10-CM | POA: Diagnosis not present

## 2022-09-28 DIAGNOSIS — I5022 Chronic systolic (congestive) heart failure: Secondary | ICD-10-CM | POA: Diagnosis not present

## 2022-09-28 DIAGNOSIS — I4891 Unspecified atrial fibrillation: Secondary | ICD-10-CM | POA: Diagnosis not present

## 2022-09-28 DIAGNOSIS — G3184 Mild cognitive impairment, so stated: Secondary | ICD-10-CM | POA: Diagnosis not present

## 2022-09-28 DIAGNOSIS — I129 Hypertensive chronic kidney disease with stage 1 through stage 4 chronic kidney disease, or unspecified chronic kidney disease: Secondary | ICD-10-CM | POA: Diagnosis not present

## 2022-09-28 DIAGNOSIS — K649 Unspecified hemorrhoids: Secondary | ICD-10-CM | POA: Diagnosis not present

## 2022-09-30 DIAGNOSIS — I129 Hypertensive chronic kidney disease with stage 1 through stage 4 chronic kidney disease, or unspecified chronic kidney disease: Secondary | ICD-10-CM | POA: Diagnosis not present

## 2022-09-30 DIAGNOSIS — I5022 Chronic systolic (congestive) heart failure: Secondary | ICD-10-CM | POA: Diagnosis not present

## 2022-09-30 DIAGNOSIS — F411 Generalized anxiety disorder: Secondary | ICD-10-CM | POA: Diagnosis not present

## 2022-09-30 DIAGNOSIS — I4891 Unspecified atrial fibrillation: Secondary | ICD-10-CM | POA: Diagnosis not present

## 2022-10-01 ENCOUNTER — Other Ambulatory Visit: Payer: Self-pay

## 2022-10-01 ENCOUNTER — Emergency Department (HOSPITAL_COMMUNITY)
Admission: EM | Admit: 2022-10-01 | Discharge: 2022-10-01 | Disposition: A | Discharge status: Home / Self Care | Payer: Medicare Other | Attending: Emergency Medicine | Admitting: Emergency Medicine

## 2022-10-01 ENCOUNTER — Encounter (HOSPITAL_COMMUNITY): Payer: Self-pay

## 2022-10-01 ENCOUNTER — Emergency Department (HOSPITAL_COMMUNITY): Payer: Medicare Other

## 2022-10-01 DIAGNOSIS — Y92129 Unspecified place in nursing home as the place of occurrence of the external cause: Secondary | ICD-10-CM | POA: Insufficient documentation

## 2022-10-01 DIAGNOSIS — S3991XA Unspecified injury of abdomen, initial encounter: Secondary | ICD-10-CM | POA: Insufficient documentation

## 2022-10-01 DIAGNOSIS — F0390 Unspecified dementia without behavioral disturbance: Secondary | ICD-10-CM | POA: Insufficient documentation

## 2022-10-01 DIAGNOSIS — I10 Essential (primary) hypertension: Secondary | ICD-10-CM | POA: Diagnosis not present

## 2022-10-01 DIAGNOSIS — S0990XA Unspecified injury of head, initial encounter: Secondary | ICD-10-CM | POA: Diagnosis not present

## 2022-10-01 DIAGNOSIS — Z7901 Long term (current) use of anticoagulants: Secondary | ICD-10-CM | POA: Insufficient documentation

## 2022-10-01 DIAGNOSIS — I5022 Chronic systolic (congestive) heart failure: Secondary | ICD-10-CM | POA: Diagnosis not present

## 2022-10-01 DIAGNOSIS — J90 Pleural effusion, not elsewhere classified: Secondary | ICD-10-CM | POA: Diagnosis not present

## 2022-10-01 DIAGNOSIS — N3000 Acute cystitis without hematuria: Secondary | ICD-10-CM | POA: Diagnosis not present

## 2022-10-01 DIAGNOSIS — W19XXXA Unspecified fall, initial encounter: Secondary | ICD-10-CM | POA: Diagnosis not present

## 2022-10-01 DIAGNOSIS — N390 Urinary tract infection, site not specified: Secondary | ICD-10-CM | POA: Insufficient documentation

## 2022-10-01 DIAGNOSIS — G9341 Metabolic encephalopathy: Secondary | ICD-10-CM | POA: Diagnosis not present

## 2022-10-01 DIAGNOSIS — I700 Atherosclerosis of aorta: Secondary | ICD-10-CM | POA: Diagnosis not present

## 2022-10-01 DIAGNOSIS — I6529 Occlusion and stenosis of unspecified carotid artery: Secondary | ICD-10-CM | POA: Diagnosis not present

## 2022-10-01 DIAGNOSIS — R918 Other nonspecific abnormal finding of lung field: Secondary | ICD-10-CM | POA: Diagnosis not present

## 2022-10-01 DIAGNOSIS — M160 Bilateral primary osteoarthritis of hip: Secondary | ICD-10-CM | POA: Diagnosis not present

## 2022-10-01 DIAGNOSIS — G9389 Other specified disorders of brain: Secondary | ICD-10-CM | POA: Diagnosis not present

## 2022-10-01 DIAGNOSIS — L97419 Non-pressure chronic ulcer of right heel and midfoot with unspecified severity: Secondary | ICD-10-CM | POA: Diagnosis not present

## 2022-10-01 DIAGNOSIS — Z79899 Other long term (current) drug therapy: Secondary | ICD-10-CM | POA: Insufficient documentation

## 2022-10-01 DIAGNOSIS — K5730 Diverticulosis of large intestine without perforation or abscess without bleeding: Secondary | ICD-10-CM | POA: Diagnosis not present

## 2022-10-01 DIAGNOSIS — S3993XA Unspecified injury of pelvis, initial encounter: Secondary | ICD-10-CM | POA: Diagnosis not present

## 2022-10-01 DIAGNOSIS — S299XXA Unspecified injury of thorax, initial encounter: Secondary | ICD-10-CM | POA: Diagnosis not present

## 2022-10-01 DIAGNOSIS — B9689 Other specified bacterial agents as the cause of diseases classified elsewhere: Secondary | ICD-10-CM | POA: Insufficient documentation

## 2022-10-01 DIAGNOSIS — K8020 Calculus of gallbladder without cholecystitis without obstruction: Secondary | ICD-10-CM | POA: Diagnosis not present

## 2022-10-01 DIAGNOSIS — I517 Cardiomegaly: Secondary | ICD-10-CM | POA: Diagnosis not present

## 2022-10-01 DIAGNOSIS — M25551 Pain in right hip: Secondary | ICD-10-CM | POA: Insufficient documentation

## 2022-10-01 DIAGNOSIS — K5900 Constipation, unspecified: Secondary | ICD-10-CM | POA: Diagnosis not present

## 2022-10-01 DIAGNOSIS — M25519 Pain in unspecified shoulder: Secondary | ICD-10-CM | POA: Diagnosis not present

## 2022-10-01 LAB — CBC WITH DIFFERENTIAL/PLATELET
Abs Immature Granulocytes: 0.06 10*3/uL (ref 0.00–0.07)
Basophils Absolute: 0.1 10*3/uL (ref 0.0–0.1)
Basophils Relative: 1 %
Eosinophils Absolute: 0.1 10*3/uL (ref 0.0–0.5)
Eosinophils Relative: 2 %
HCT: 45.3 % (ref 36.0–46.0)
Hemoglobin: 14 g/dL (ref 12.0–15.0)
Immature Granulocytes: 1 %
Lymphocytes Relative: 23 %
Lymphs Abs: 2 10*3/uL (ref 0.7–4.0)
MCH: 29 pg (ref 26.0–34.0)
MCHC: 30.9 g/dL (ref 30.0–36.0)
MCV: 94 fL (ref 80.0–100.0)
Monocytes Absolute: 0.5 10*3/uL (ref 0.1–1.0)
Monocytes Relative: 5 %
Neutro Abs: 5.9 10*3/uL (ref 1.7–7.7)
Neutrophils Relative %: 68 %
Platelets: 163 10*3/uL (ref 150–400)
RBC: 4.82 MIL/uL (ref 3.87–5.11)
RDW: 18.1 % — ABNORMAL HIGH (ref 11.5–15.5)
WBC: 8.6 10*3/uL (ref 4.0–10.5)
nRBC: 0 % (ref 0.0–0.2)

## 2022-10-01 LAB — URINALYSIS, ROUTINE W REFLEX MICROSCOPIC
Bilirubin Urine: NEGATIVE
Glucose, UA: NEGATIVE mg/dL
Hgb urine dipstick: NEGATIVE
Ketones, ur: 5 mg/dL — AB
Nitrite: NEGATIVE
Protein, ur: 100 mg/dL — AB
Specific Gravity, Urine: 1.024 (ref 1.005–1.030)
WBC, UA: >50 WBC/hpf (ref 0–5)
pH: 5 (ref 5.0–8.0)

## 2022-10-01 LAB — COMPREHENSIVE METABOLIC PANEL
ALT: 20 U/L (ref 0–44)
AST: 23 U/L (ref 15–41)
Albumin: 3.1 g/dL — ABNORMAL LOW (ref 3.5–5.0)
Alkaline Phosphatase: 50 U/L (ref 38–126)
Anion gap: 12 (ref 5–15)
BUN: 29 mg/dL — ABNORMAL HIGH (ref 8–23)
CO2: 23 mmol/L (ref 22–32)
Calcium: 8.8 mg/dL — ABNORMAL LOW (ref 8.9–10.3)
Chloride: 107 mmol/L (ref 98–111)
Creatinine, Ser: 1.21 mg/dL — ABNORMAL HIGH (ref 0.44–1.00)
GFR, Estimated: 42 mL/min — ABNORMAL LOW (ref >60)
Glucose, Bld: 93 mg/dL (ref 70–99)
Potassium: 3.6 mmol/L (ref 3.5–5.1)
Sodium: 142 mmol/L (ref 135–145)
Total Bilirubin: 0.8 mg/dL (ref 0.3–1.2)
Total Protein: 6.4 g/dL — ABNORMAL LOW (ref 6.5–8.1)

## 2022-10-01 LAB — LIPASE, BLOOD: Lipase: 34 U/L (ref 11–51)

## 2022-10-01 LAB — VALPROIC ACID LEVEL: Valproic Acid Lvl: 18 ug/mL — ABNORMAL LOW (ref 50.0–100.0)

## 2022-10-01 MED ORDER — CEPHALEXIN 250 MG PO CAPS: 250.0000 mg | ORAL_CAPSULE | Freq: Four times a day (QID) | ORAL | 0 refills | Status: AC

## 2022-10-01 MED ORDER — SODIUM CHLORIDE (PF) 0.9 % IJ SOLN
INTRAMUSCULAR | Status: AC
  Filled 2022-10-01: qty 50

## 2022-10-01 MED ORDER — SODIUM CHLORIDE 0.9 % IV SOLN
1.0000 g | Freq: Once | INTRAVENOUS | Status: AC
  Administered 2022-10-01: 1 g via INTRAVENOUS
  Filled 2022-10-01: qty 10

## 2022-10-01 MED ORDER — FOSFOMYCIN TROMETHAMINE 3 G PO PACK
3.0000 g | PACK | Freq: Once | ORAL | Status: DC
  Filled 2022-10-01: qty 3

## 2022-10-01 MED ORDER — IOHEXOL 300 MG/ML  SOLN
75.0000 mL | Freq: Once | INTRAMUSCULAR | Status: AC | PRN
  Administered 2022-10-01: 75 mL via INTRAVENOUS

## 2022-10-01 NOTE — ED Provider Notes (Signed)
[facility] Provider Note   CSN: [id number] Arrival date & time: 10/01/22  0410     History  No chief complaint on file.   Amanda Cochran is a 89 y.o. female.  Level 5 caveat for dementia.  Patient unable to give a history.  History of stroke with right-sided weakness, glaucoma, hypertension, hyperlipidemia presenting after unwitnessed fall.  She was found on the ground by nursing home staff around 3 AM.  Was complaining of right hip pain to staff but denies pain to EMS into ED staff.  She is oriented x 1 at baseline.  Denies head, neck, back, chest or abdominal pain.  Denies hip pain.  Does not remember falling.  She was recently admitted for acute encephalopathy thought secondary to UTI.  Discharged on July 24.  The history is provided by the patient and the EMS personnel. The history is limited by the condition of the patient.       Home Medications Prior to Admission medications   Medication Sig Start Date End Date Taking? Authorizing Provider  apixaban (ELIQUIS) 2.5 MG TABS tablet Take 1 tablet (2.5 mg total) by mouth 2 (two) times daily. 09/16/22 10/16/22  Hughie Closs, MD  Ascorbic Acid (VITAMIN C PO) Take 1 tablet by mouth daily.     [provider]  benazepril (LOTENSIN) 20 MG tablet Take 20 mg by mouth daily.    [provider]  divalproex (DEPAKOTE SPRINKLE) 125 MG capsule Take 1 capsule (125 mg total) by mouth every 8 (eight) hours. 08/29/22 09/28/22  Calvert Cantor, MD  feeding supplement (ENSURE ENLIVE / ENSURE PLUS) LIQD Take 237 mLs by mouth 3 (three) times daily between meals. 08/29/22   Calvert Cantor, MD  latanoprost (XALATAN) 0.005 % ophthalmic solution Place 1 drop into both eyes at bedtime.     [provider]  levothyroxine (SYNTHROID, LEVOTHROID) 100 MCG tablet Take 1 tablet (100 mcg total) by mouth daily. Patient taking differently: Take 100 mcg by mouth daily before breakfast. 02/19/14   Angiulli,  Mcarthur Rossetti, PA-C  metoprolol succinate (TOPROL-XL) 50 MG 24 hr tablet Take 1 tablet (50 mg total) by mouth daily. Take with or immediately following a meal. 08/30/22   Calvert Cantor, MD  Multiple Vitamins-Minerals (CENTRUM SILVER PO) Take 1 tablet by mouth daily.    [provider]  Omega-3 Fatty Acids (FISH OIL) 1000 MG CAPS Take 1,000 mg by mouth daily.     [provider]  pilocarpine (PILOCAR) 1 % ophthalmic solution Place 1 drop into both eyes 4 (four) times daily.  08/06/16   [provider]  timolol (TIMOPTIC) 0.5 % ophthalmic solution Place 1 drop into both eyes daily. 03/18/22   [provider]      Allergies    Patient has no known allergies.    Review of Systems   Review of Systems  Unable to perform ROS: Dementia    Physical Exam Updated Vital Signs There were no vitals taken for this visit. Physical Exam Vitals and nursing note reviewed.  Constitutional:      General: She is not in acute distress.    Appearance: She is well-developed. She is not ill-appearing.  HENT:     Head: Normocephalic and atraumatic.     Mouth/Throat:     Pharynx: No oropharyngeal exudate.  Eyes:     Conjunctiva/sclera: Conjunctivae normal.     Pupils: Pupils are equal, round, and reactive to light.  Neck:  Comments: No C-spine tenderness Cardiovascular:     Rate and Rhythm: Normal rate and regular rhythm.     Heart sounds: Normal heart sounds. No murmur heard. Pulmonary:     Effort: Pulmonary effort is normal. No respiratory distress.     Breath sounds: Normal breath sounds.  Chest:     Chest wall: No tenderness.  Abdominal:     Palpations: Abdomen is soft.     Tenderness: There is no abdominal tenderness. There is no guarding or rebound.  Musculoskeletal:        General: No tenderness. Normal range of motion.     Cervical back: Normal range of motion and neck supple.     Comments: No T or L-spine tenderness.  Full range of motion of bilateral hips  without pain. Full range of motion of right hip without pain  Skin:    General: Skin is warm.  Neurological:     Mental Status: She is alert.     Cranial Nerves: No cranial nerve deficit.     Motor: No abnormal muscle tone.     Coordination: Coordination normal.     Comments: Oriented to person.  Moves all extremities equally.  Follows commands  Psychiatric:        Behavior: Behavior normal.     ED Results / Procedures / Treatments   Labs (all labs ordered are listed, but only abnormal results are displayed) Labs Reviewed  URINALYSIS, ROUTINE W REFLEX MICROSCOPIC - Abnormal; Notable for the following components:      Result Value   APPearance HAZY (*)    Ketones, ur 5 (*)    Protein, ur 100 (*)    Leukocytes,Ua MODERATE (*)    Bacteria, UA MANY (*)    All other components within normal limits  CBC WITH DIFFERENTIAL/PLATELET - Abnormal; Notable for the following components:   RDW 18.1 (*)    All other components within normal limits  COMPREHENSIVE METABOLIC PANEL - Abnormal; Notable for the following components:   BUN 29 (*)    Creatinine, Ser 1.21 (*)    Calcium 8.8 (*)    Total Protein 6.4 (*)    Albumin 3.1 (*)    GFR, Estimated 42 (*)    All other components within normal limits  VALPROIC ACID LEVEL - Abnormal; Notable for the following components:   Valproic Acid Lvl 18 (*)    All other components within normal limits  URINE CULTURE  LIPASE, BLOOD    EKG EKG Interpretation Date/Time:  Thursday October 01 2022 04:25:27 EDT Ventricular Rate:  142 PR Interval:    QRS Duration:  112 QT Interval:  352 QTC Calculation: 524 R Axis:   108  Text Interpretation: Atrial fibrillation with rapid V-rate Borderline intraventricular conduction delay Borderline low voltage, extremity leads Repolarization abnormality, prob rate related Baseline wander in lead(s) V1 No significant change was found Confirmed by Glynn Octave [phone]) on 10/01/2022 4:33:58 AM  Radiology CT  Head Wo Contrast  Result Date: 10/01/2022 CLINICAL DATA:  89 year old female found down. EXAM: CT HEAD WITHOUT CONTRAST TECHNIQUE: Contiguous axial images were obtained from the base of the skull through the vertex without intravenous contrast. RADIATION DOSE REDUCTION: This exam was performed according to the departmental dose-optimization program which includes automated exposure control, adjustment of the mA and/or kV according to patient size and/or use of iterative reconstruction technique. COMPARISON:  Head CT 08/19/2022. FINDINGS: Brain: Stable chronic encephalomalacia in the right parietal lobe and occipital pole. Stable cerebral volume. Confluent  bilateral cerebral white matter hypodensity with discrete chronic lacunar infarct of the left corona radiata. No midline shift, ventriculomegaly, mass effect, evidence of mass lesion, intracranial hemorrhage or evidence of cortically based acute infarction. Vascular: No suspicious intracranial vascular hyperdensity. Calcified atherosclerosis at the skull base. Skull: Mild motion artifact. Stable visualized osseous structures. No acute osseous abnormality identified. Sinuses/Orbits: Visualized paranasal sinuses and mastoids are stable and well aerated. Other: No discrete orbit or scalp soft tissue injury. IMPRESSION: Mild motion artifact. No acute intracranial abnormality or acute traumatic injury identified. Stable non contrast CT appearance of chronic ischemic disease. Electronically Signed   By: Odessa Fleming M.D.   On: 10/01/2022 07:41   DG Pelvis Portable  Result Date: 10/01/2022 CLINICAL DATA:  89 year old female with history of trauma from a fall. EXAM: PORTABLE PELVIS 1-2 VIEWS COMPARISON:  No priors. FINDINGS: Single oblique view of the pelvis is limited by nonstandard projection. There are lucencies extending through the right superior and inferior pubic rami, which appear related to prominent stool in the underlying rectum. No definite acute displaced  fracture confidently identified. Bilateral proximal femurs as visualized appear intact, and the femoral heads project over the acetabula bilaterally. Joint space narrowing, subchondral sclerosis, subchondral cyst formation and osteophyte formation in the hip joints bilaterally (right greater than left), indicative of osteoarthritis. IMPRESSION: 1. Slightly limited study secondary to suboptimal patient positioning demonstrating no definite acute radiographic abnormality of the bony pelvis. 2. Bilateral hip joint osteoarthritis (severe on the right and moderate on the left). Electronically Signed   By: Trudie Reed M.D.   On: 10/01/2022 06:35   DG Chest Portable 1 View  Result Date: 10/01/2022 CLINICAL DATA:  89 year old female with history of trauma from a fall. EXAM: PORTABLE CHEST 1 VIEW COMPARISON:  Chest x-ray 09/02/2022. FINDINGS: Lung volumes are normal. Opacity at the left base obscuring the left hemidiaphragm which may reflect atelectasis and/or consolidation, likely with superimposed small left pleural effusion. Probable small right pleural effusion also noted. No pneumothorax. No evidence of pulmonary edema. Mild cardiomegaly. Upper mediastinal contours are within normal limits. Atherosclerotic calcifications are noted in the thoracic aorta. IMPRESSION: 1. Atelectasis and/or consolidation in the left lung base. 2. Small bilateral pleural effusions. 3. Mild cardiomegaly. 4. Aortic atherosclerosis. Electronically Signed   By: Trudie Reed M.D.   On: 10/01/2022 06:26    Procedures Procedures    Medications Ordered in ED Medications - No data to display  ED Course/ Medical Decision Making/ A&P                                 Medical Decision Making Amount and/or Complexity of Data Reviewed Independent Historian: EMS Labs: ordered. Decision-making details documented in ED Course. Radiology: ordered and independent interpretation performed. Decision-making details documented in ED  Course. ECG/medicine tests: ordered and independent interpretation performed. Decision-making details documented in ED Course.  Risk Prescription drug management.   Unwitnessed fall.  Found on the ground.  Does take Eliquis.  She complained of right hip pain earlier but none now.  No obvious hip fracture on x-ray.  Chest x-ray shows atelectasis and small pleural effusion similar to previous.  Lab is reassuring.  Urinalysis is consistent with UTI.  She was recently treated for same.  Will send culture and give dose of fosfomycin.  Given anticoagulation use will need imaging of CT head and C-spine.  Also complaining of abdominal pain and ongoing right hip pain  with equivocal x-ray.  CT head, C-spine and abdomen pelvis pending at shift change.  Dr. Jearld Fenton to assume care.         Final Clinical Impression(s) / ED Diagnoses Final diagnoses:  None    Rx / DC Orders ED Discharge Orders     None         , Jeannett Senior, MD 10/01/22 [phone]

## 2022-10-01 NOTE — ED Triage Notes (Signed)
Pt BIB EMS from [facility], [place] for a fall. Staff saw the pt on the floor around 3am and stated she has been complaining of right hip pain. With EMS pt denies pain.   A&Ox1 at baseline  HR 60s-70s

## 2022-10-01 NOTE — ED Provider Notes (Signed)
1:42 AM Assumed care of patient from off-going team. For more details, please see note from same day.  In brief, this is a 89 y.o. female unwitnessed fall on thinners at nursing facility. Prior R sided stroke. Endorsed R hip pain w/ EMS but not complaining of that here, Dr. Manus Gunning ranged hip without difficulty. Hip XRs limited but do not think it's a fracture. Abd pain on exam.  DC'd a week ago for encephalopathy 2/2 dementia/PNA/E coli UTI. Urine does look dirty but just finished a course of abx. Will send culture.  Plan/Dispo at time of sign-out & ED Course since sign-out:   BP (!) 132/92   Pulse 87   Temp 97.8 F (36.6 C) (Oral)   Resp 16   SpO2 98%    ED Course:   Clinical Course as of 10/02/22 0142  Thu Oct 01, 2022  0825 CT ABDOMEN PELVIS W CONTRAST 1. Intermittent motion artifact. No acute traumatic injury identified in the abdomen or pelvis. 2. Cardiomegaly with small layering pleural effusions. 3. Cholelithiasis. Sigmoid diverticulosis. Aortic Atherosclerosis (ICD10-I70.0).   [HN]  0825 CT Cervical Spine Wo Contrast 1. No acute traumatic injury identified in the cervical spine. 2. Advanced cervical spine degeneration and widespread ankylosis. 3. Layering pleural effusions in the lung apices with mild pulmonary septal thickening. Consider interstitial edema. 4. Carotid and Aortic Atherosclerosis (ICD10-I70.0).   [HN]  0825 CT Head Wo Contrast Mild motion artifact. No acute intracranial abnormality or acute traumatic injury identified. Stable non contrast CT appearance of chronic ischemic disease.   [HN]  1152 After multiple attempts by nursing, patient is unable to swallow the fosfomycin.  Will give her a dose of IV ceftriaxone here and attempt to call the facility to determine if this is normal for her. [HN]  1229 Called and discussed with the patient's facility.  [facility].  Discussed with the nurse who is familiar with the patient.  She states that  she is working with speech therapy at the facility and does not typically swallow liquids.  They normally have to crush her medicines and to applesauce, yogurt, or ice cream.  They state that it is normal for the patient did not know where she is or to confabulate as she is doing here today.  That is her baseline for her dementia.  Patient appears to be at her baseline.  Will give her IV ceftriaxone and discharge her with oral antibiotics.  Urine culture in process.  Will be given discharge instructions and return precautions written for the facility. [HN]    Clinical Course User Index [HN] Loetta Rough, MD    Dispo: DC ------------------------------- Vivi Barrack, MD Emergency Medicine  This note was created using dictation software, which may contain spelling or grammatical errors.   Loetta Rough, MD 10/02/22 [phone]

## 2022-10-01 NOTE — ED Notes (Signed)
Patient refused temperature check by Audria Nine, NT.

## 2022-10-01 NOTE — Discharge Instructions (Addendum)
Thank you for coming to [facility] Emergency Department. You were seen for fall. We did an exam, labs, and imaging, and these showed a UTI. Please take keflex 250 mg four times per day for 5 days. A urine culture has been drawn and you will be notified if you need to take a different antibiotic.  Please follow up with your primary care provider within 1 week.   Do not hesitate to return to the ED or call 911 if you experience: -Worsening symptoms -Lightheadedness, passing out -Fevers/chills -Anything else that concerns you

## 2022-10-04 ENCOUNTER — Telehealth (HOSPITAL_BASED_OUTPATIENT_CLINIC_OR_DEPARTMENT_OTHER): Payer: Self-pay | Admitting: *Deleted

## 2022-10-04 NOTE — Telephone Encounter (Signed)
Post ED Visit - Positive Culture Follow-up: Successful Patient Follow-Up  Culture assessed and recommendations reviewed by:  []  Enzo Bi, Pharm.D. []  Celedonio Miyamoto, Pharm.D., BCPS AQ-ID []  Garvin Fila, Pharm.D., BCPS []  Georgina Pillion, Pharm.D., BCPS []  [place], [address].D., BCPS, AAHIVP []  Estella Husk, Pharm.D., BCPS, AAHIVP []  Lysle Pearl, PharmD, BCPS []  Phillips Climes, PharmD, BCPS []  Agapito Games, PharmD, BCPS [x]  Tacy Learn, PharmD  Positive urine culture  []  Patient discharged without antimicrobial prescription and treatment is now indicated [x]  Organism is resistant to prescribed ED discharge antimicrobial []  Patient with positive blood cultures  Changes discussed with ED provider: Maxwell Marion, PA-C New antibiotic prescription Amoxicillin 500mg  bid x 5 days Called and faxed report to [facility], [place], [place]  Contacted facility, date 10/04/22 , time 1407   Amanda Cochran, Amanda Cochran 10/04/2022, 2:08 PM

## 2022-10-04 NOTE — Progress Notes (Signed)
ED Antimicrobial Stewardship Positive Culture Follow Up   Amanda Cochran is an 89 y.o. female who presented to [facility] on 10/01/2022 with a chief complaint of unwitnessed fall at nursing home.  No complaints of urinary symptoms on 8/8.  Recently discharged 7/24 after treated for encephalopathy thought to be secondary to UTI.   Recent Results (from the past 720 hour(s))  Urine Culture (for pregnant, neutropenic or urologic patients or patients with an indwelling urinary catheter)     Status: Abnormal   Collection Time: 10/01/22  7:34 AM   Specimen: Urine, Clean Catch  Result Value Ref Range Status   Specimen Description   Final    URINE, CLEAN CATCH Performed at [facility], [address], [place], [place] [address]    Special Requests   Final    NONE Performed at [facility], [address], [place], [place] [address]    Culture (A)  Final    80,000 COLONIES/mL AEROCOCCUS SPECIES Standardized susceptibility testing for this organism is not available. >=100,000 COLONIES/mL ENTEROCOCCUS FAECALIS    Report Status 10/03/2022 FINAL  Final   Organism ID, Bacteria ENTEROCOCCUS FAECALIS (A)  Final      Susceptibility   Enterococcus faecalis - MIC*    AMPICILLIN <=2 SENSITIVE Sensitive     NITROFURANTOIN <=16 SENSITIVE Sensitive     VANCOMYCIN 1 SENSITIVE Sensitive     * >=100,000 COLONIES/mL ENTEROCOCCUS FAECALIS    [x]  Treated with Cephalexin, organism resistant to prescribed antimicrobial []  Patient discharged originally without antimicrobial agent and treatment is now indicated  8/8 Urine Cx:  enterococcus faecalis (>100,000 col/ml) sensitive to ampicillin & aerococcus sp (80,000 col./ml) w/ no sensitivities reported Discharged from ED on Cephalexin.   Discontinue Cephalexin. New antibiotic prescription: Amoxicillin 500mg  po bid x 5 days  ED Provider: Maxwell Marion, PA    Amanda Cochran 10/04/2022, 1:46 PM Clinical Pharmacist Monday -  Friday phone -  [phone] Saturday - Sunday phone - [phone]

## 2022-10-05 DIAGNOSIS — I4891 Unspecified atrial fibrillation: Secondary | ICD-10-CM | POA: Diagnosis not present

## 2022-10-05 DIAGNOSIS — W19XXXD Unspecified fall, subsequent encounter: Secondary | ICD-10-CM | POA: Diagnosis not present

## 2022-10-05 DIAGNOSIS — N390 Urinary tract infection, site not specified: Secondary | ICD-10-CM | POA: Diagnosis not present

## 2022-10-05 DIAGNOSIS — I5022 Chronic systolic (congestive) heart failure: Secondary | ICD-10-CM | POA: Diagnosis not present

## 2022-10-05 DIAGNOSIS — I129 Hypertensive chronic kidney disease with stage 1 through stage 4 chronic kidney disease, or unspecified chronic kidney disease: Secondary | ICD-10-CM | POA: Diagnosis not present

## 2022-10-05 DIAGNOSIS — F411 Generalized anxiety disorder: Secondary | ICD-10-CM | POA: Diagnosis not present

## 2022-10-07 ENCOUNTER — Ambulatory Visit: Payer: Medicare Other | Admitting: Podiatry

## 2022-10-07 ENCOUNTER — Encounter: Payer: Self-pay | Admitting: Podiatry

## 2022-10-07 DIAGNOSIS — L97522 Non-pressure chronic ulcer of other part of left foot with fat layer exposed: Secondary | ICD-10-CM | POA: Diagnosis not present

## 2022-10-07 DIAGNOSIS — L97512 Non-pressure chronic ulcer of other part of right foot with fat layer exposed: Secondary | ICD-10-CM

## 2022-10-07 NOTE — Progress Notes (Signed)
  Subjective:  Patient ID: Amanda Cochran, female    DOB: 01-Jun-1928,   MRN: [id number]  Chief Complaint  Patient presents with   Nail Problem    new pt-callus R foot-non req    89 y.o. female presents for concern of callus on her right foot and left foot. Patient here alone from a facility and difficult to get history from.  Denies any other pedal complaints. Denies n/v/f/c.   Past Medical History:  Diagnosis Date   Arthritis    Bladder infection    Blindness    CVA (cerebral infarction) 02/07/2014   Left basal ganglia infarct with right hemiparesis    Glaucoma    Glaucoma    Glaucoma (increased eye pressure)    Hyperlipidemia    Hypertension    Left sided lacunar stroke (HCC) 02/14/2014   Right hemiparesis (HCC) 02/09/2014   Left basal ganglia infarct Dec 2015    Stroke [facility])    Thyroid disease     Objective:  Physical Exam: Vascular: DP/PT pulses 2/4 bilateral. CFT <3 seconds. Normal hair growth on digits. No edema.  Skin. No lacerations or abrasions bilateral feet. Hyperkeratotic lesions noted to bilateral fifth metatarsal heads and first metatrsal head on the left. Upon debridement ulcerations noted about 0.5 cm circumfirentially with granular base. No erythema edema or purulence noted.  Musculoskeletal: MMT 5/5 bilateral lower extremities in DF, PF, Inversion and Eversion. Deceased ROM in DF of ankle joint.  Neurological: Sensation intact to light touch.   Assessment:   1. Ulcer of left foot with fat layer exposed (HCC)   2. Ulcer of right foot with fat layer exposed (HCC)      Plan:  Patient was evaluated and treated and all questions answered. Ulcer plantar fifth metatarasal on right with fat layer exposed, left fifth mettarsal with fat layer exposed and first mettarsal on left with fat layer exposed  -Debridement as below. -Dressed with betadine , DSD. -Off-loading with padding and slipper  -No abx indicated.  -Discussed glucose control and proper protein-rich  diet.  -Discussed if any worsening redness, pain, fever or chills to call or may need to report to the emergency room. Patient expressed understanding.   Procedure: Excisional Debridement of Wound Rationale: Removal of non-viable soft tissue from the wound to promote healing.  Anesthesia: none Pre-Debridement Wound Measurements: Overlying callus  Post-Debridement Wound Measurements: 0.5 cm x 0.5 cm x 0.2 cm  to each wound on plantar fifth metatarsal bilateral and first metatarsal on left  Type of Debridement: Sharp Excisional Tissue Removed: Non-viable soft tissue Depth of Debridement: subcutaneous tissue. Technique: Sharp excisional debridement to bleeding, viable wound base.  Dressing: Dry, sterile, compression dressing. Disposition: Patient tolerated procedure well. Patient to return in 2 week for follow-up.  Return in about 2 weeks (around 10/21/2022) for wound check.   Louann Sjogren, DPM

## 2022-10-08 ENCOUNTER — Telehealth: Payer: Self-pay

## 2022-10-08 NOTE — Telephone Encounter (Signed)
Transition Care Management Unsuccessful Follow-up Telephone Call  Date of discharge and from where:  [facility] 8/8  Attempts:  2nd  Reason for unsuccessful TCM follow-up call:  No answer/busy   Lenard Forth [facility] Guide, [facility] [phone] [address] [place], [place], [place] [address] Phone: [phone] Email: [email or web address]

## 2022-10-08 NOTE — Telephone Encounter (Signed)
Transition Care Management Unsuccessful Follow-up Telephone Call  Date of discharge and from where:  Amanda Cochran 8/8  Attempts:  1st Attempt  Reason for unsuccessful TCM follow-up call:  No answer/busy   Lenard Forth [facility] Guide, [facility] [phone] [address] [place], [place], [place] [address] Phone: [phone] Email: [email or web address]

## 2022-10-13 DIAGNOSIS — I129 Hypertensive chronic kidney disease with stage 1 through stage 4 chronic kidney disease, or unspecified chronic kidney disease: Secondary | ICD-10-CM | POA: Diagnosis not present

## 2022-10-13 DIAGNOSIS — G3184 Mild cognitive impairment, so stated: Secondary | ICD-10-CM | POA: Diagnosis not present

## 2022-10-13 DIAGNOSIS — I5022 Chronic systolic (congestive) heart failure: Secondary | ICD-10-CM | POA: Diagnosis not present

## 2022-10-13 DIAGNOSIS — I4891 Unspecified atrial fibrillation: Secondary | ICD-10-CM | POA: Diagnosis not present

## 2022-10-14 DIAGNOSIS — I4891 Unspecified atrial fibrillation: Secondary | ICD-10-CM | POA: Diagnosis not present

## 2022-10-14 DIAGNOSIS — I129 Hypertensive chronic kidney disease with stage 1 through stage 4 chronic kidney disease, or unspecified chronic kidney disease: Secondary | ICD-10-CM | POA: Diagnosis not present

## 2022-10-14 DIAGNOSIS — E440 Moderate protein-calorie malnutrition: Secondary | ICD-10-CM | POA: Diagnosis not present

## 2022-10-14 DIAGNOSIS — I5022 Chronic systolic (congestive) heart failure: Secondary | ICD-10-CM | POA: Diagnosis not present

## 2022-10-15 DIAGNOSIS — S61411A Laceration without foreign body of right hand, initial encounter: Secondary | ICD-10-CM | POA: Diagnosis not present

## 2022-10-15 DIAGNOSIS — G3184 Mild cognitive impairment, so stated: Secondary | ICD-10-CM | POA: Diagnosis not present

## 2022-10-15 DIAGNOSIS — I4891 Unspecified atrial fibrillation: Secondary | ICD-10-CM | POA: Diagnosis not present

## 2022-10-15 DIAGNOSIS — F411 Generalized anxiety disorder: Secondary | ICD-10-CM | POA: Diagnosis not present

## 2022-10-15 DIAGNOSIS — E440 Moderate protein-calorie malnutrition: Secondary | ICD-10-CM | POA: Diagnosis not present

## 2022-10-15 DIAGNOSIS — I5022 Chronic systolic (congestive) heart failure: Secondary | ICD-10-CM | POA: Diagnosis not present

## 2022-10-15 DIAGNOSIS — I129 Hypertensive chronic kidney disease with stage 1 through stage 4 chronic kidney disease, or unspecified chronic kidney disease: Secondary | ICD-10-CM | POA: Diagnosis not present

## 2022-10-16 ENCOUNTER — Emergency Department (HOSPITAL_COMMUNITY): Payer: Medicare Other

## 2022-10-16 ENCOUNTER — Encounter (HOSPITAL_COMMUNITY): Payer: Self-pay | Admitting: Internal Medicine

## 2022-10-16 ENCOUNTER — Inpatient Hospital Stay (HOSPITAL_COMMUNITY)
Admission: EM | Admit: 2022-10-16 | Discharge: 2022-10-19 | DRG: 291 | Disposition: A | Discharge status: Skilled Nursing Facility | Payer: Medicare Other | Source: Skilled Nursing Facility | Attending: Internal Medicine | Admitting: Internal Medicine

## 2022-10-16 DIAGNOSIS — S2231XD Fracture of one rib, right side, subsequent encounter for fracture with routine healing: Secondary | ICD-10-CM | POA: Diagnosis not present

## 2022-10-16 DIAGNOSIS — F411 Generalized anxiety disorder: Secondary | ICD-10-CM | POA: Diagnosis not present

## 2022-10-16 DIAGNOSIS — R296 Repeated falls: Secondary | ICD-10-CM | POA: Diagnosis not present

## 2022-10-16 DIAGNOSIS — Z8249 Family history of ischemic heart disease and other diseases of the circulatory system: Secondary | ICD-10-CM | POA: Diagnosis not present

## 2022-10-16 DIAGNOSIS — M47816 Spondylosis without myelopathy or radiculopathy, lumbar region: Secondary | ICD-10-CM | POA: Diagnosis not present

## 2022-10-16 DIAGNOSIS — W19XXXA Unspecified fall, initial encounter: Principal | ICD-10-CM

## 2022-10-16 DIAGNOSIS — I10 Essential (primary) hypertension: Secondary | ICD-10-CM | POA: Diagnosis not present

## 2022-10-16 DIAGNOSIS — S0181XA Laceration without foreign body of other part of head, initial encounter: Secondary | ICD-10-CM | POA: Diagnosis not present

## 2022-10-16 DIAGNOSIS — J811 Chronic pulmonary edema: Secondary | ICD-10-CM | POA: Diagnosis not present

## 2022-10-16 DIAGNOSIS — N179 Acute kidney failure, unspecified: Secondary | ICD-10-CM

## 2022-10-16 DIAGNOSIS — S299XXA Unspecified injury of thorax, initial encounter: Secondary | ICD-10-CM | POA: Diagnosis not present

## 2022-10-16 DIAGNOSIS — J9601 Acute respiratory failure with hypoxia: Secondary | ICD-10-CM | POA: Diagnosis not present

## 2022-10-16 DIAGNOSIS — I480 Paroxysmal atrial fibrillation: Secondary | ICD-10-CM | POA: Diagnosis present

## 2022-10-16 DIAGNOSIS — I110 Hypertensive heart disease with heart failure: Secondary | ICD-10-CM | POA: Diagnosis present

## 2022-10-16 DIAGNOSIS — M160 Bilateral primary osteoarthritis of hip: Secondary | ICD-10-CM | POA: Diagnosis not present

## 2022-10-16 DIAGNOSIS — H4010X0 Unspecified open-angle glaucoma, stage unspecified: Secondary | ICD-10-CM | POA: Diagnosis present

## 2022-10-16 DIAGNOSIS — R4189 Other symptoms and signs involving cognitive functions and awareness: Secondary | ICD-10-CM | POA: Diagnosis not present

## 2022-10-16 DIAGNOSIS — M48061 Spinal stenosis, lumbar region without neurogenic claudication: Secondary | ICD-10-CM | POA: Diagnosis present

## 2022-10-16 DIAGNOSIS — M47814 Spondylosis without myelopathy or radiculopathy, thoracic region: Secondary | ICD-10-CM | POA: Diagnosis not present

## 2022-10-16 DIAGNOSIS — K7689 Other specified diseases of liver: Secondary | ICD-10-CM | POA: Diagnosis not present

## 2022-10-16 DIAGNOSIS — I493 Ventricular premature depolarization: Secondary | ICD-10-CM | POA: Diagnosis present

## 2022-10-16 DIAGNOSIS — G309 Alzheimer's disease, unspecified: Secondary | ICD-10-CM | POA: Diagnosis not present

## 2022-10-16 DIAGNOSIS — I5023 Acute on chronic systolic (congestive) heart failure: Secondary | ICD-10-CM | POA: Diagnosis not present

## 2022-10-16 DIAGNOSIS — J90 Pleural effusion, not elsewhere classified: Secondary | ICD-10-CM | POA: Diagnosis not present

## 2022-10-16 DIAGNOSIS — Z79890 Hormone replacement therapy (postmenopausal): Secondary | ICD-10-CM | POA: Diagnosis not present

## 2022-10-16 DIAGNOSIS — E039 Hypothyroidism, unspecified: Secondary | ICD-10-CM | POA: Diagnosis present

## 2022-10-16 DIAGNOSIS — H548 Legal blindness, as defined in USA: Secondary | ICD-10-CM | POA: Diagnosis not present

## 2022-10-16 DIAGNOSIS — Z8673 Personal history of transient ischemic attack (TIA), and cerebral infarction without residual deficits: Secondary | ICD-10-CM

## 2022-10-16 DIAGNOSIS — S3992XA Unspecified injury of lower back, initial encounter: Secondary | ICD-10-CM | POA: Diagnosis not present

## 2022-10-16 DIAGNOSIS — E876 Hypokalemia: Secondary | ICD-10-CM | POA: Diagnosis not present

## 2022-10-16 DIAGNOSIS — M5032 Other cervical disc degeneration, mid-cervical region, unspecified level: Secondary | ICD-10-CM | POA: Diagnosis not present

## 2022-10-16 DIAGNOSIS — Z79899 Other long term (current) drug therapy: Secondary | ICD-10-CM

## 2022-10-16 DIAGNOSIS — S01111A Laceration without foreign body of right eyelid and periocular area, initial encounter: Secondary | ICD-10-CM | POA: Diagnosis not present

## 2022-10-16 DIAGNOSIS — E079 Disorder of thyroid, unspecified: Secondary | ICD-10-CM | POA: Diagnosis not present

## 2022-10-16 DIAGNOSIS — I700 Atherosclerosis of aorta: Secondary | ICD-10-CM | POA: Diagnosis not present

## 2022-10-16 DIAGNOSIS — Z66 Do not resuscitate: Secondary | ICD-10-CM | POA: Diagnosis present

## 2022-10-16 DIAGNOSIS — Y92129 Unspecified place in nursing home as the place of occurrence of the external cause: Secondary | ICD-10-CM

## 2022-10-16 DIAGNOSIS — S199XXA Unspecified injury of neck, initial encounter: Secondary | ICD-10-CM | POA: Diagnosis not present

## 2022-10-16 DIAGNOSIS — I5082 Biventricular heart failure: Secondary | ICD-10-CM | POA: Diagnosis present

## 2022-10-16 DIAGNOSIS — Z7901 Long term (current) use of anticoagulants: Secondary | ICD-10-CM

## 2022-10-16 DIAGNOSIS — W010XXA Fall on same level from slipping, tripping and stumbling without subsequent striking against object, initial encounter: Secondary | ICD-10-CM | POA: Diagnosis present

## 2022-10-16 DIAGNOSIS — S3993XA Unspecified injury of pelvis, initial encounter: Secondary | ICD-10-CM | POA: Diagnosis not present

## 2022-10-16 DIAGNOSIS — S0990XA Unspecified injury of head, initial encounter: Secondary | ICD-10-CM | POA: Diagnosis not present

## 2022-10-16 DIAGNOSIS — E785 Hyperlipidemia, unspecified: Secondary | ICD-10-CM | POA: Diagnosis not present

## 2022-10-16 DIAGNOSIS — S0993XA Unspecified injury of face, initial encounter: Secondary | ICD-10-CM | POA: Diagnosis not present

## 2022-10-16 DIAGNOSIS — I517 Cardiomegaly: Secondary | ICD-10-CM | POA: Diagnosis not present

## 2022-10-16 HISTORY — DX: Paroxysmal atrial fibrillation: I48.0

## 2022-10-16 HISTORY — DX: Chronic systolic (congestive) heart failure: I50.22

## 2022-10-16 LAB — SAMPLE TO BLOOD BANK

## 2022-10-16 LAB — CBC
HCT: 48.2 % — ABNORMAL HIGH (ref 36.0–46.0)
Hemoglobin: 14.8 g/dL (ref 12.0–15.0)
MCH: 29.7 pg (ref 26.0–34.0)
MCHC: 30.7 g/dL (ref 30.0–36.0)
MCV: 96.8 fL (ref 80.0–100.0)
Platelets: 138 10*3/uL — ABNORMAL LOW (ref 150–400)
RBC: 4.98 MIL/uL (ref 3.87–5.11)
RDW: 20.2 % — ABNORMAL HIGH (ref 11.5–15.5)
WBC: 9.7 10*3/uL (ref 4.0–10.5)
nRBC: 0 % (ref 0.0–0.2)

## 2022-10-16 LAB — I-STAT CHEM 8, ED
BUN: 29 mg/dL — ABNORMAL HIGH (ref 8–23)
Calcium, Ion: 1.01 mmol/L — ABNORMAL LOW (ref 1.15–1.40)
Chloride: 105 mmol/L (ref 98–111)
Creatinine, Ser: 1.6 mg/dL — ABNORMAL HIGH (ref 0.44–1.00)
Glucose, Bld: 133 mg/dL — ABNORMAL HIGH (ref 70–99)
HCT: 46 % (ref 36.0–46.0)
Hemoglobin: 15.6 g/dL — ABNORMAL HIGH (ref 12.0–15.0)
Potassium: 4.2 mmol/L (ref 3.5–5.1)
Sodium: 140 mmol/L (ref 135–145)
TCO2: 25 mmol/L (ref 22–32)

## 2022-10-16 LAB — COMPREHENSIVE METABOLIC PANEL
ALT: 44 U/L (ref 0–44)
AST: 52 U/L — ABNORMAL HIGH (ref 15–41)
Albumin: 3 g/dL — ABNORMAL LOW (ref 3.5–5.0)
Alkaline Phosphatase: 84 U/L (ref 38–126)
Anion gap: 14 (ref 5–15)
BUN: 28 mg/dL — ABNORMAL HIGH (ref 8–23)
CO2: 23 mmol/L (ref 22–32)
Calcium: 8.6 mg/dL — ABNORMAL LOW (ref 8.9–10.3)
Chloride: 102 mmol/L (ref 98–111)
Creatinine, Ser: 1.59 mg/dL — ABNORMAL HIGH (ref 0.44–1.00)
GFR, Estimated: 30 mL/min — ABNORMAL LOW (ref >60)
Glucose, Bld: 135 mg/dL — ABNORMAL HIGH (ref 70–99)
Potassium: 4.3 mmol/L (ref 3.5–5.1)
Sodium: 139 mmol/L (ref 135–145)
Total Bilirubin: 1.1 mg/dL (ref 0.3–1.2)
Total Protein: 6.4 g/dL — ABNORMAL LOW (ref 6.5–8.1)

## 2022-10-16 LAB — CK: Total CK: 58 U/L (ref 38–234)

## 2022-10-16 LAB — PROTIME-INR
INR: 1.4 — ABNORMAL HIGH (ref 0.8–1.2)
Prothrombin Time: 17.1 seconds — ABNORMAL HIGH (ref 11.4–15.2)

## 2022-10-16 MED ORDER — LIDOCAINE-EPINEPHRINE-TETRACAINE (LET) TOPICAL GEL
3.0000 mL | Freq: Once | TOPICAL | Status: AC
  Administered 2022-10-16: 3 mL via TOPICAL
  Filled 2022-10-16: qty 3

## 2022-10-16 MED ORDER — MELATONIN 3 MG PO TABS
3.0000 mg | ORAL_TABLET | Freq: Every evening | ORAL | Status: DC | PRN
  Administered 2022-10-18: 3 mg via ORAL
  Filled 2022-10-16: qty 1

## 2022-10-16 MED ORDER — ACETAMINOPHEN 650 MG RE SUPP: 650.0000 mg | Freq: Four times a day (QID) | RECTAL | Status: DC | PRN

## 2022-10-16 MED ORDER — ACETAMINOPHEN 325 MG PO TABS
650.0000 mg | ORAL_TABLET | Freq: Four times a day (QID) | ORAL | Status: DC | PRN
  Administered 2022-10-18: 650 mg via ORAL
  Filled 2022-10-16: qty 2

## 2022-10-16 MED ORDER — IOHEXOL 350 MG/ML SOLN
60.0000 mL | Freq: Once | INTRAVENOUS | Status: AC | PRN
  Administered 2022-10-16: 60 mL via INTRAVENOUS

## 2022-10-16 MED ORDER — ONDANSETRON HCL 4 MG/2ML IJ SOLN
4.0000 mg | Freq: Four times a day (QID) | INTRAMUSCULAR | Status: DC | PRN
  Administered 2022-10-18: 4 mg via INTRAVENOUS
  Filled 2022-10-16: qty 2

## 2022-10-16 NOTE — ED Notes (Signed)
ED TO INPATIENT HANDOFF REPORT  ED Nurse Name and Phone #:  Marcello Moores [phone]  S Name/Age/Gender Amanda Cochran 89 y.o. female Room/Bed: 018C/018C  Code Status   Code Status: Full Code  Home/SNF/Other Nursing Home Patient oriented to: place Is this baseline? Yes   Triage Complete: Triage complete  Chief Complaint Acute on chronic systolic heart failure [facility]) [I50.23]  Triage Note Patient BIB [facility] from [facility] due to fall on thinners. No staff no gave report to EMS so unsure of it was witnessed or unwitnessed, Unknown LOC. Patient takes Eliquis. Patient Alert but confused, unsure if baseline or not. Patient has laceration above right eye. VSS.   Allergies No Known Allergies  Level of Care/Admitting Diagnosis ED Disposition     ED Disposition  Admit   Condition  --   Comment  Hospital Area: [facility] [[id number]]  Level of Care: Telemetry Medical [104]  May admit patient to [facility] or [facility] if equivalent level of care is available:: No  Covid Evaluation: Asymptomatic - no recent exposure (last 10 days) testing not required  Diagnosis: Acute on chronic systolic heart failure (HCC) [428.23.ICD-9-CM]  Admitting Physician: Angie Fava [[id number]]  Attending Physician: Angie Fava [[id number]]  Certification:: I certify this patient will need inpatient services for at least 2 midnights  Expected Medical Readiness: 10/18/2022          B Medical/Surgery History Past Medical History:  Diagnosis Date   Arthritis    Bladder infection    Blindness    Chronic systolic heart failure (HCC)    CVA (cerebral infarction) 02/07/2014   Left basal ganglia infarct with right hemiparesis    Glaucoma    Glaucoma    Glaucoma (increased eye pressure)    Hyperlipidemia    Hypertension    Left sided lacunar stroke (HCC) 02/14/2014   Paroxysmal atrial fibrillation (HCC)    Right hemiparesis (HCC) 02/09/2014   Left basal ganglia infarct Dec 2015     Stroke [facility])    Thyroid disease    Past Surgical History:  Procedure Laterality Date   cataract surgery Bilateral 2015   THYROID SURGERY  1976   TONSILLECTOMY AND ADENOIDECTOMY     VAGINAL PROLAPSE REPAIR       A IV Location/Drains/Wounds Patient Lines/Drains/Airways Status     Active Line/Drains/Airways     Name Placement date Placement time Site Days   Peripheral IV 10/16/22 18 G Right Antecubital 10/16/22  2056  Antecubital  less than 1   Wound / Incision (Open or Dehisced) 09/12/22 Irritant Dermatitis (Moisture Associated Skin Damage) Buttocks Medial 09/12/22  2000  Buttocks  34            Intake/Output Last 24 hours No intake or output data in the 24 hours ending 10/16/22 2348  Labs/Imaging Results for orders placed or performed during the hospital encounter of 10/16/22 (from the past 48 hour(s))  Comprehensive metabolic panel     Status: Abnormal   Collection Time: 10/16/22  8:53 PM  Result Value Ref Range   Sodium 139 135 - 145 mmol/L   Potassium 4.3 3.5 - 5.1 mmol/L    Comment: HEMOLYSIS AT THIS LEVEL MAY AFFECT RESULT   Chloride 102 98 - 111 mmol/L   CO2 23 22 - 32 mmol/L   Glucose, Bld 135 (H) 70 - 99 mg/dL    Comment: Glucose reference range applies only to samples taken after fasting for at least 8 hours.   BUN  28 (H) 8 - 23 mg/dL   Creatinine, Ser 5.63 (H) 0.44 - 1.00 mg/dL   Calcium 8.6 (L) 8.9 - 10.3 mg/dL   Total Protein 6.4 (L) 6.5 - 8.1 g/dL   Albumin 3.0 (L) 3.5 - 5.0 g/dL   AST 52 (H) 15 - 41 U/L    Comment: HEMOLYSIS AT THIS LEVEL MAY AFFECT RESULT   ALT 44 0 - 44 U/L    Comment: HEMOLYSIS AT THIS LEVEL MAY AFFECT RESULT   Alkaline Phosphatase 84 38 - 126 U/L   Total Bilirubin 1.1 0.3 - 1.2 mg/dL    Comment: HEMOLYSIS AT THIS LEVEL MAY AFFECT RESULT   GFR, Estimated 30 (L) >60 mL/min    Comment: (NOTE) Calculated using the CKD-EPI Creatinine Equation (2021)    Anion gap 14 5 - 15    Comment: Performed at [facility], [address] [place], [place] [address]  CBC     Status: Abnormal   Collection Time: 10/16/22  8:53 PM  Result Value Ref Range   WBC 9.7 4.0 - 10.5 K/uL   RBC 4.98 3.87 - 5.11 MIL/uL   Hemoglobin 14.8 12.0 - 15.0 g/dL   HCT 33.2 (H) 95.1 - 88.4 %   MCV 96.8 80.0 - 100.0 fL   MCH 29.7 26.0 - 34.0 pg   MCHC 30.7 30.0 - 36.0 g/dL   RDW 16.6 (H) 06.3 - 01.6 %   Platelets 138 (L) 150 - 400 K/uL   nRBC 0.0 0.0 - 0.2 %    Comment: Performed at [facility], [address], [place], [place] [address]  Protime-INR     Status: Abnormal   Collection Time: 10/16/22  8:53 PM  Result Value Ref Range   Prothrombin Time 17.1 (H) 11.4 - 15.2 seconds   INR 1.4 (H) 0.8 - 1.2    Comment: (NOTE) INR goal varies based on device and disease states. Performed at [facility], [address] [place], [place] [address]   Sample to Blood Bank     Status: None   Collection Time: 10/16/22  8:53 PM  Result Value Ref Range   Blood Bank Specimen SAMPLE AVAILABLE FOR TESTING    Sample Expiration      10/19/2022,2359 Performed at [facility], [address], [place], [place] [address]   CK     Status: None   Collection Time: 10/16/22  8:53 PM  Result Value Ref Range   Total CK 58 38 - 234 U/L    Comment: HEMOLYSIS AT THIS LEVEL MAY AFFECT RESULT Performed at [facility], [address] [place], [place] [address]   I-Stat Chem 8, ED     Status: Abnormal   Collection Time: 10/16/22  9:13 PM  Result Value Ref Range   Sodium 140 135 - 145 mmol/L   Potassium 4.2 3.5 - 5.1 mmol/L   Chloride 105 98 - 111 mmol/L   BUN 29 (H) 8 - 23 mg/dL   Creatinine, Ser 6.23 (H) 0.44 - 1.00 mg/dL   Glucose, Bld 762 (H) 70 - 99 mg/dL    Comment: Glucose reference range applies only to samples taken after fasting for at least 8 hours.   Calcium, Ion 1.01 (L) 1.15 - 1.40 mmol/L   TCO2 25 22 - 32 mmol/L   Hemoglobin 15.6 (H) 12.0 - 15.0 g/dL   HCT 83.1 51.7 - 61.6 %   CT T-SPINE NO CHARGE  Result  Date: 10/16/2022 CLINICAL DATA:  Fall with blunt polytrauma. EXAM: CT THORACIC AND LUMBAR SPINE WITHOUT CONTRAST TECHNIQUE: Multidetector CT imaging of the thoracic and lumbar spine was performed without intravenous contrast. Multiplanar CT image reconstructions were also generated. RADIATION DOSE REDUCTION: This exam was performed according to the departmental dose-optimization program which includes automated exposure control, adjustment of the mA and/or kV according to patient size and/or use of iterative reconstruction technique. COMPARISON:  CT abdomen and pelvis and reconstructions dated 10/01/2022, contemporaneous chest CT. FINDINGS: CT THORACIC SPINE FINDINGS Alignment: There is mild thoracic kyphodextroscoliosis, without AP listhesis. Vertebrae: No acute fracture or focal pathologic process is seen. There is osteopenia. There is spondylosis, with multilevel right anterolateral bridging enthesopathy starting at T3 and continuing through T10-11. Paraspinal and other soft tissues: No acute findings. Small right-greater-than-left pleural effusions are unchanged since 10/01/2022. No paraspinal hematoma or reactive change. Disc levels: The thoracic discs are degenerated throughout 2 varying degrees. The greatest disc collapse at T5-6, T6-7, T7-8 and T8-9. No herniated discs or cord compromise are suspected. Dorsal ligamentous hypertrophic ossification on the right impresses mildly on the right dorsolateral thecal sac at T11-12. There are slight facet spurring changes at the lower thoracic levels, but no thoracic spine levels demonstrate significant foraminal encroachment. CT LUMBAR SPINE FINDINGS Segmentation: 5 lumbar type vertebrae. Alignment: Chronic grade 1 L5-S1 anterolisthesis is again noted due to chronic L5 pars defects. Alignment is otherwise within normal limits. Vertebrae: There is osteopenia. There is preservation of the normal vertebral body heights. No acute lumbar fracture is seen or focal  pathologic process. There is moderate marginal osteophytosis, most prominently from L3-4 down. Paraspinal and other soft tissues: Heavy aortoiliac atherosclerosis. No acute findings. Disc levels: The discs are variably degenerated, with vacuum phenomenon noted at L2-3, L4-5 and L5-S1. The greatest disc space loss is at the lowest 2 levels, with relatively mild disc space loss at L1-2, L2-3 and L3-4. There is normal disc height at T12-L1. L1-2: There has a posterior disc osteophyte complex causing mild spinal canal stenosis without significant lateral recess encroachment. There are right-greater-than-left facet spurs but no foraminal stenosis. L2-3: Severe acquired spinal stenosis again noted due to circumferential disc osteophyte complex, advanced facet hypertrophy, and ligamentous thickening. Both lateral recesses are effaced. There is moderate right and mild left foraminal stenosis. L3-4: There is a calcified central disc extrusion. Again noted severe acquired spinal stenosis due to the disc extrusion, superimposed circumferential disc osteophyte complex, advanced facet hypertrophy, with ligamentous thickening. Both lateral recesses are effaced. There is moderate to severe right and mild left foraminal stenosis. L4-5: Mild-to-moderate facet hypertrophy partially effaces the lateral recesses. There is a mild nonstenosing posterior disc osteophyte complex without herniation. There is moderate right-greater-than-left foraminal stenosis. L5-S1: Chronic L5 pars defects. Grade 1 L5-S1 spondylolisthesis with disc collapse and vacuum phenomenon. No herniated disc. Disc space air again extends into the right lateral recess. There are bilateral facet spurs. There is bilateral severe acquired foraminal stenosis. There is ankylosis across the left SI joint, spurring of both SI joints are patent right SI joint. No sacral insufficiency fracture is seen. IMPRESSION: 1. Thoracic spine degenerative changes and mild  kyphodextroscoliosis without evidence of acute fractures. 2. Small right-greater-than-left pleural effusions, unchanged since 10/01/2022. 3. Lumbar osteopenia and degenerative change without evidence of acute fractures. 4. Chronic L5 pars defects with grade 1 L5-S1 spondylolisthesis. 5. Severe acquired spinal stenosis at L2-3 and L3-4 with bilateral lateral recess effacement, and bilateral foraminal stenosis. 6. Lateral recess effacement at  L4-5 with moderate right-greater-than-left foraminal stenosis. 7. Severe bilateral L5-S1 foraminal stenosis. 8. Aortic atherosclerosis. Aortic Atherosclerosis (ICD10-I70.0). Electronically Signed   By: Almira Bar M.D.   On: 10/16/2022 23:18   CT L-SPINE NO CHARGE  Result Date: 10/16/2022 CLINICAL DATA:  Fall with blunt polytrauma. EXAM: CT THORACIC AND LUMBAR SPINE WITHOUT CONTRAST TECHNIQUE: Multidetector CT imaging of the thoracic and lumbar spine was performed without intravenous contrast. Multiplanar CT image reconstructions were also generated. RADIATION DOSE REDUCTION: This exam was performed according to the departmental dose-optimization program which includes automated exposure control, adjustment of the mA and/or kV according to patient size and/or use of iterative reconstruction technique. COMPARISON:  CT abdomen and pelvis and reconstructions dated 10/01/2022, contemporaneous chest CT. FINDINGS: CT THORACIC SPINE FINDINGS Alignment: There is mild thoracic kyphodextroscoliosis, without AP listhesis. Vertebrae: No acute fracture or focal pathologic process is seen. There is osteopenia. There is spondylosis, with multilevel right anterolateral bridging enthesopathy starting at T3 and continuing through T10-11. Paraspinal and other soft tissues: No acute findings. Small right-greater-than-left pleural effusions are unchanged since 10/01/2022. No paraspinal hematoma or reactive change. Disc levels: The thoracic discs are degenerated throughout 2 varying degrees.  The greatest disc collapse at T5-6, T6-7, T7-8 and T8-9. No herniated discs or cord compromise are suspected. Dorsal ligamentous hypertrophic ossification on the right impresses mildly on the right dorsolateral thecal sac at T11-12. There are slight facet spurring changes at the lower thoracic levels, but no thoracic spine levels demonstrate significant foraminal encroachment. CT LUMBAR SPINE FINDINGS Segmentation: 5 lumbar type vertebrae. Alignment: Chronic grade 1 L5-S1 anterolisthesis is again noted due to chronic L5 pars defects. Alignment is otherwise within normal limits. Vertebrae: There is osteopenia. There is preservation of the normal vertebral body heights. No acute lumbar fracture is seen or focal pathologic process. There is moderate marginal osteophytosis, most prominently from L3-4 down. Paraspinal and other soft tissues: Heavy aortoiliac atherosclerosis. No acute findings. Disc levels: The discs are variably degenerated, with vacuum phenomenon noted at L2-3, L4-5 and L5-S1. The greatest disc space loss is at the lowest 2 levels, with relatively mild disc space loss at L1-2, L2-3 and L3-4. There is normal disc height at T12-L1. L1-2: There has a posterior disc osteophyte complex causing mild spinal canal stenosis without significant lateral recess encroachment. There are right-greater-than-left facet spurs but no foraminal stenosis. L2-3: Severe acquired spinal stenosis again noted due to circumferential disc osteophyte complex, advanced facet hypertrophy, and ligamentous thickening. Both lateral recesses are effaced. There is moderate right and mild left foraminal stenosis. L3-4: There is a calcified central disc extrusion. Again noted severe acquired spinal stenosis due to the disc extrusion, superimposed circumferential disc osteophyte complex, advanced facet hypertrophy, with ligamentous thickening. Both lateral recesses are effaced. There is moderate to severe right and mild left foraminal  stenosis. L4-5: Mild-to-moderate facet hypertrophy partially effaces the lateral recesses. There is a mild nonstenosing posterior disc osteophyte complex without herniation. There is moderate right-greater-than-left foraminal stenosis. L5-S1: Chronic L5 pars defects. Grade 1 L5-S1 spondylolisthesis with disc collapse and vacuum phenomenon. No herniated disc. Disc space air again extends into the right lateral recess. There are bilateral facet spurs. There is bilateral severe acquired foraminal stenosis. There is ankylosis across the left SI joint, spurring of both SI joints are patent right SI joint. No sacral insufficiency fracture is seen. IMPRESSION: 1. Thoracic spine degenerative changes and mild kyphodextroscoliosis without evidence of acute fractures. 2. Small right-greater-than-left pleural effusions, unchanged since 10/01/2022. 3. Lumbar osteopenia and  degenerative change without evidence of acute fractures. 4. Chronic L5 pars defects with grade 1 L5-S1 spondylolisthesis. 5. Severe acquired spinal stenosis at L2-3 and L3-4 with bilateral lateral recess effacement, and bilateral foraminal stenosis. 6. Lateral recess effacement at L4-5 with moderate right-greater-than-left foraminal stenosis. 7. Severe bilateral L5-S1 foraminal stenosis. 8. Aortic atherosclerosis. Aortic Atherosclerosis (ICD10-I70.0). Electronically Signed   By: Almira Bar M.D.   On: 10/16/2022 23:18   CT MAXILLOFACIAL WO CONTRAST  Result Date: 10/16/2022 CLINICAL DATA:  Level 2 fall with blunt poly fusion trauma on blood thinners. EXAM: CT MAXILLOFACIAL WITHOUT CONTRAST CT CHEST, ABDOMEN AND PELVIS WITH CONTRAST TECHNIQUE: Multidetector CT imaging of the maxillofacial structures was performed. Multiplanar CT image reconstructions were also generated. A small metallic BB was placed on the right temple in order to reliably differentiate right from left. Multidetector CT imaging of the chest, abdomen and pelvis was performed following  the standard protocol during bolus administration of intravenous contrast. RADIATION DOSE REDUCTION: This exam was performed according to the departmental dose-optimization program which includes automated exposure control, adjustment of the mA and/or kV according to patient size and/or use of iterative reconstruction technique. CONTRAST:  60mL OMNIPAQUE IOHEXOL 350 MG/ML SOLN COMPARISON:  CT scan head without contrast 09/30/2021, CT abdomen and pelvis with contrast 10/01/2022, chest CT with contrast 08/19/2000, abdomen and pelvis CT no contrast 02/04/2006. FINDINGS: CT MAXILLOFACIAL FINDINGS Osseous: Chronic slightly depressed fracture, right side of the nasal bone. No evidence of acute facial fracture. No mandibular dislocation is seen. There is asymmetric joint space loss and remodeling of the left TMJ. There is osteopenia. Orbits: Negative. No traumatic or inflammatory finding. Again noted are old lens replacements. Sinuses: There is mild membrane thickening in the paranasal sinuses without fluid levels. There is a broad defect in the cartilaginous nasal septum which may be postsurgical. There is a small defect in the medial wall of the right maxillary sinus alongside the middle nasal meatus which also may be postsurgical such as due to prior meatotomy. Both of the ostiomeatal complexes are clear. The turbinates are intact. Moderate to severe deviation of the nasal septum to the left is again noted. The mastoid air cells are clear.  The middle ears are clear. Soft tissues: There is laceration in the lateral aspect of the right brow with underlying soft tissue gas. There is a small amount of air in the left external jugular vein and right IJ vein, most likely injected placement of the patient's IV. The epiglottis and tongue base appear normal. Both proximal cervical ICAs are heavily calcified with likely flow-limiting stenoses. CT CHEST FINDINGS Cardiovascular: Moderate to severe cardiomegaly is again noted with  a right chamber predominance indicating likely chronic right heart dysfunction. There are three-vessel coronary artery calcifications. No pericardial effusion. Pulmonary arteries are normal caliber and centrally clear. There are prominent superior pulmonary veins. There is aortic atherosclerosis tortuosity and scattered calcific plaques in the great vessels but no stenosis, dissection or aneurysm. Mediastinum/Nodes: No enlarged mediastinal, hilar, or axillary lymph nodes. Thyroid gland, trachea, and esophagus demonstrate no significant findings. Lungs/Pleura: Again noted are small right-greater-than-left layering pleural effusions, with adjacent atelectasis in the lower lobes, unchanged. Small amount of apical pleural-parenchymal scarring is similar to 2002. There is diffuse bronchial thickening. The lungs demonstrate mild mosaicism most likely due to small airways disease with air trapping. Scattered linear scar-like opacities both bases. Again there appears to be slight interstitial edema in both lung bases but I do not see evidence for airspace edema.  This was seen previously.  No suspicious pulmonary nodule. Musculoskeletal: There is osteopenia, degenerative changes of the spine including multilevel bridging enthesopathy, and chronic mild thoracic kyphodextroscoliosis. No spinal compression fracture is seen. The ribcage, shoulder girdles and sternum demonstrate no evidence of an acute fracture. There is a nondisplaced subacute fracture of the anterolateral right fifth rib with evidence of early healing. This is above the plane of the prior abdomen pelvis CT. There are dystrophic calcifications in both breasts consistent with fibrocystic disease but no chest wall hematoma or mass. CT ABDOMEN AND PELVIS FINDINGS Hepatobiliary: Single small stone in the proximal gallbladder. No wall thickening. No biliary dilatation. Unremarkable liver without obvious laceration or adjacent hematoma. Pancreas: Partially atrophic.   Otherwise unremarkable. Spleen: No abnormality. Adrenals/Urinary Tract: Adrenal glands are unremarkable. Kidneys are normal, without renal calculi, focal lesion, or hydronephrosis. Bladder is unremarkable. Stomach/Bowel: No dilatation or wall thickening. Moderate fecal stasis. Sigmoid diverticulosis without evidence of diverticulitis. Normal caliber appendix. Vascular/Lymphatic: Heavy aortoiliac calcific plaque. Multifocal ostial branch vessel atherosclerosis. No AAA. No adenopathy. Reproductive: Uterus and bilateral adnexa are unremarkable. Other: Trace ascites in the posterior deep pelvis. Mild increased edema extends along the body wall, greater in the dependent areas. There is no free hemorrhage, free air, or focal inflammatory process. Small umbilical fat hernia. Musculoskeletal: Chronic L5 pars defects and grade 1 L5-S1 spondylolisthesis with chronic disc collapse with vacuum phenomenon. Osteopenia and degenerative change remaining lumbar spine. No acute regional skeletal fractures. Ankylosis left SI joint. Severe acquired spinal stenosis at L2-3, L3-4. IMPRESSION: 1. Laceration in the lateral right brow with underlying soft tissue gas. No acute facial or orbital fractures. 2. Osteopenia and degenerative change. 3. Cardiomegaly with right chamber predominance, prominent superior pulmonary veins, small right-greater-than-left layering pleural effusions with probable slight interstitial edema in the lung bases. Similar findings on abdomen and pelvis CT 10/01/2022. 4. Bronchitis with mosaicism in the lungs most likely due to small airways disease with air trapping. 5. Aortic and coronary artery atherosclerosis. 6. Cholelithiasis. 7. Constipation and diverticulosis. 8. Trace ascites in the posterior deep pelvis. No free air or focal inflammatory process. Increased body wall edema. 9. Chronic L5 pars defects with grade 1 L5-S1 spondylolisthesis and chronic disc collapse. 10. Severe acquired spinal stenosis at L2-3  and L3-4. 11. Subacute nondisplaced fracture of the anterolateral right fifth rib with evidence of early healing. No acute regional skeletal fractures are seen. 12. Small amount of air in the left EJ vein and right IJ vein, most likely iatrogenic. Aortic Atherosclerosis (ICD10-I70.0). Electronically Signed   By: Almira Bar M.D.   On: 10/16/2022 22:47   CT CHEST ABDOMEN PELVIS W CONTRAST  Result Date: 10/16/2022 CLINICAL DATA:  Level 2 fall with blunt poly fusion trauma on blood thinners. EXAM: CT MAXILLOFACIAL WITHOUT CONTRAST CT CHEST, ABDOMEN AND PELVIS WITH CONTRAST TECHNIQUE: Multidetector CT imaging of the maxillofacial structures was performed. Multiplanar CT image reconstructions were also generated. A small metallic BB was placed on the right temple in order to reliably differentiate right from left. Multidetector CT imaging of the chest, abdomen and pelvis was performed following the standard protocol during bolus administration of intravenous contrast. RADIATION DOSE REDUCTION: This exam was performed according to the departmental dose-optimization program which includes automated exposure control, adjustment of the mA and/or kV according to patient size and/or use of iterative reconstruction technique. CONTRAST:  60mL OMNIPAQUE IOHEXOL 350 MG/ML SOLN COMPARISON:  CT scan head without contrast 09/30/2021, CT abdomen and pelvis with contrast 10/01/2022, chest CT  with contrast 08/19/2000, abdomen and pelvis CT no contrast 02/04/2006. FINDINGS: CT MAXILLOFACIAL FINDINGS Osseous: Chronic slightly depressed fracture, right side of the nasal bone. No evidence of acute facial fracture. No mandibular dislocation is seen. There is asymmetric joint space loss and remodeling of the left TMJ. There is osteopenia. Orbits: Negative. No traumatic or inflammatory finding. Again noted are old lens replacements. Sinuses: There is mild membrane thickening in the paranasal sinuses without fluid levels. There is a  broad defect in the cartilaginous nasal septum which may be postsurgical. There is a small defect in the medial wall of the right maxillary sinus alongside the middle nasal meatus which also may be postsurgical such as due to prior meatotomy. Both of the ostiomeatal complexes are clear. The turbinates are intact. Moderate to severe deviation of the nasal septum to the left is again noted. The mastoid air cells are clear.  The middle ears are clear. Soft tissues: There is laceration in the lateral aspect of the right brow with underlying soft tissue gas. There is a small amount of air in the left external jugular vein and right IJ vein, most likely injected placement of the patient's IV. The epiglottis and tongue base appear normal. Both proximal cervical ICAs are heavily calcified with likely flow-limiting stenoses. CT CHEST FINDINGS Cardiovascular: Moderate to severe cardiomegaly is again noted with a right chamber predominance indicating likely chronic right heart dysfunction. There are three-vessel coronary artery calcifications. No pericardial effusion. Pulmonary arteries are normal caliber and centrally clear. There are prominent superior pulmonary veins. There is aortic atherosclerosis tortuosity and scattered calcific plaques in the great vessels but no stenosis, dissection or aneurysm. Mediastinum/Nodes: No enlarged mediastinal, hilar, or axillary lymph nodes. Thyroid gland, trachea, and esophagus demonstrate no significant findings. Lungs/Pleura: Again noted are small right-greater-than-left layering pleural effusions, with adjacent atelectasis in the lower lobes, unchanged. Small amount of apical pleural-parenchymal scarring is similar to 2002. There is diffuse bronchial thickening. The lungs demonstrate mild mosaicism most likely due to small airways disease with air trapping. Scattered linear scar-like opacities both bases. Again there appears to be slight interstitial edema in both lung bases but I do  not see evidence for airspace edema. This was seen previously.  No suspicious pulmonary nodule. Musculoskeletal: There is osteopenia, degenerative changes of the spine including multilevel bridging enthesopathy, and chronic mild thoracic kyphodextroscoliosis. No spinal compression fracture is seen. The ribcage, shoulder girdles and sternum demonstrate no evidence of an acute fracture. There is a nondisplaced subacute fracture of the anterolateral right fifth rib with evidence of early healing. This is above the plane of the prior abdomen pelvis CT. There are dystrophic calcifications in both breasts consistent with fibrocystic disease but no chest wall hematoma or mass. CT ABDOMEN AND PELVIS FINDINGS Hepatobiliary: Single small stone in the proximal gallbladder. No wall thickening. No biliary dilatation. Unremarkable liver without obvious laceration or adjacent hematoma. Pancreas: Partially atrophic.  Otherwise unremarkable. Spleen: No abnormality. Adrenals/Urinary Tract: Adrenal glands are unremarkable. Kidneys are normal, without renal calculi, focal lesion, or hydronephrosis. Bladder is unremarkable. Stomach/Bowel: No dilatation or wall thickening. Moderate fecal stasis. Sigmoid diverticulosis without evidence of diverticulitis. Normal caliber appendix. Vascular/Lymphatic: Heavy aortoiliac calcific plaque. Multifocal ostial branch vessel atherosclerosis. No AAA. No adenopathy. Reproductive: Uterus and bilateral adnexa are unremarkable. Other: Trace ascites in the posterior deep pelvis. Mild increased edema extends along the body wall, greater in the dependent areas. There is no free hemorrhage, free air, or focal inflammatory process. Small umbilical fat hernia. Musculoskeletal:  Chronic L5 pars defects and grade 1 L5-S1 spondylolisthesis with chronic disc collapse with vacuum phenomenon. Osteopenia and degenerative change remaining lumbar spine. No acute regional skeletal fractures. Ankylosis left SI joint.  Severe acquired spinal stenosis at L2-3, L3-4. IMPRESSION: 1. Laceration in the lateral right brow with underlying soft tissue gas. No acute facial or orbital fractures. 2. Osteopenia and degenerative change. 3. Cardiomegaly with right chamber predominance, prominent superior pulmonary veins, small right-greater-than-left layering pleural effusions with probable slight interstitial edema in the lung bases. Similar findings on abdomen and pelvis CT 10/01/2022. 4. Bronchitis with mosaicism in the lungs most likely due to small airways disease with air trapping. 5. Aortic and coronary artery atherosclerosis. 6. Cholelithiasis. 7. Constipation and diverticulosis. 8. Trace ascites in the posterior deep pelvis. No free air or focal inflammatory process. Increased body wall edema. 9. Chronic L5 pars defects with grade 1 L5-S1 spondylolisthesis and chronic disc collapse. 10. Severe acquired spinal stenosis at L2-3 and L3-4. 11. Subacute nondisplaced fracture of the anterolateral right fifth rib with evidence of early healing. No acute regional skeletal fractures are seen. 12. Small amount of air in the left EJ vein and right IJ vein, most likely iatrogenic. Aortic Atherosclerosis (ICD10-I70.0). Electronically Signed   By: Almira Bar M.D.   On: 10/16/2022 22:47   CT CERVICAL SPINE WO CONTRAST  Result Date: 10/16/2022 CLINICAL DATA:  Polytrauma, blunt.  Fall EXAM: CT CERVICAL SPINE WITHOUT CONTRAST TECHNIQUE: Multidetector CT imaging of the cervical spine was performed without intravenous contrast. Multiplanar CT image reconstructions were also generated. RADIATION DOSE REDUCTION: This exam was performed according to the departmental dose-optimization program which includes automated exposure control, adjustment of the mA and/or kV according to patient size and/or use of iterative reconstruction technique. COMPARISON:  None Available. FINDINGS: Alignment: Normal Skull base and vertebrae: No acute fracture. No primary  bone lesion or focal pathologic process. Soft tissues and spinal canal: No prevertebral fluid or swelling. No visible canal hematoma. Disc levels: Diffuse degenerative disc disease, most pronounced in the lower cervical spine. Diffuse advanced degenerative facet disease. Upper chest: Biapical scarring.  No acute findings. Other: None IMPRESSION: Degenerative disc and facet disease.  No acute bony abnormality. Electronically Signed   By: Charlett Nose M.D.   On: 10/16/2022 22:10   CT HEAD WO CONTRAST  Result Date: 10/16/2022 CLINICAL DATA:  Head trauma, moderate-severe. Fall. On blood thinners. EXAM: CT HEAD WITHOUT CONTRAST TECHNIQUE: Contiguous axial images were obtained from the base of the skull through the vertex without intravenous contrast. RADIATION DOSE REDUCTION: This exam was performed according to the departmental dose-optimization program which includes automated exposure control, adjustment of the mA and/or kV according to patient size and/or use of iterative reconstruction technique. 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 There is atrophy and chronic small vessel disease changes. No acute intracranial abnormality. Specifically, no hemorrhage, hydrocephalus, mass lesion, acute infarction, or significant intracranial injury. Vascular: No hyperdense vessel or unexpected calcification. Skull: No acute calvarial abnormality. Sinuses/Orbits: No acute findings Other: None IMPRESSION: Old right parietal infarct. Atrophy, chronic microvascular disease. No acute intracranial abnormality. Electronically Signed   By: Charlett Nose M.D.   On: 10/16/2022 22:07   DG Pelvis Portable  Result Date: 10/16/2022 CLINICAL DATA:  Trauma level 2 fall EXAM: PORTABLE PELVIS 1-2 VIEWS COMPARISON:  10/01/2022 FINDINGS: SI joints show mild degenerative change but no widening. Pubic symphysis and rami appear intact. No fracture or malalignment. Moderate bilateral hip  degenerative changes IMPRESSION:  No acute osseous abnormality. Electronically Signed   By: Jasmine Pang M.D.   On: 10/16/2022 21:53   DG Chest Portable 1 View  Result Date: 10/16/2022 CLINICAL DATA:  Trauma level 2 fall EXAM: PORTABLE CHEST 1 VIEW COMPARISON:  10/01/2022, CT 08/19/2000 FINDINGS: Cardiomegaly with mild diffuse interstitial opacities possible edema. Possible left effusion. Aortic atherosclerosis. No convincing pneumothorax IMPRESSION: Cardiomegaly with mild interstitial edema and possible small left effusion. Electronically Signed   By: Jasmine Pang M.D.   On: 10/16/2022 21:49    Pending Labs Unresulted Labs (From admission, onward)     Start     Ordered   10/17/22 0500  CBC with Differential/Platelet  Tomorrow morning,   R        10/16/22 2337   10/17/22 0500  Comprehensive metabolic panel  Tomorrow morning,   R        10/16/22 2337   10/17/22 0500  Magnesium  Tomorrow morning,   R        10/16/22 2337   10/17/22 0500  Phosphorus  Tomorrow morning,   R        10/16/22 2337   10/16/22 2337  Magnesium  Add-on,   AD        10/16/22 2337   10/16/22 2106  Urinalysis, Routine w reflex microscopic -Urine, Clean Catch  [facility] ED TRAUMA PANEL [facility])  Once,   URGENT       Question:  Specimen Source  Answer:  Urine, Clean Catch   10/16/22 2108            Vitals/Pain Today's Vitals   10/16/22 2055 10/16/22 2100 10/16/22 2215 10/16/22 2230  BP:  130/80 (!) 149/69 130/86  Pulse:  78 76 69  Resp:  14 19 19   Temp:  98.1 F (36.7 C)    TempSrc:  Oral    SpO2: 96% 95% 100% 100%    Isolation Precautions No active isolations  Medications Medications  acetaminophen (TYLENOL) tablet 650 mg (has no administration in time range)    Or  acetaminophen (TYLENOL) suppository 650 mg (has no administration in time range)  melatonin tablet 3 mg (has no administration in time range)  ondansetron (ZOFRAN) injection 4 mg (has no administration in time range)  iohexol (OMNIPAQUE) 350  MG/ML injection 60 mL (60 mLs Intravenous Contrast Given 10/16/22 2158)  lidocaine-EPINEPHrine-tetracaine (LET) topical gel (3 mLs Topical Given 10/16/22 2209)    Mobility walks with device     Focused Assessments     R Recommendations: See Admitting Provider Note  Report given to:   Additional Notes:  Talked to Family on the phone. They said 75% of the time she is A&Ox4 and the other 25% of the time she is alert but very confused. Currently in her confusion state. Not trying to get out of bed.

## 2022-10-16 NOTE — ED Provider Notes (Signed)
[place] EMERGENCY DEPARTMENT AT [facility] Provider Note   CSN: [id number] Arrival date & time: 10/16/22  2046     History  Chief Complaint  Patient presents with   Fall on Thinners    Amanda Cochran is a 89 y.o. female.  HPI   Patient is a 89yF w/ PMHx of significant of open-angle glaucoma of both eyes with legal blindness, CVA with some residual right-sided weakness (uses a cane to ambulate), hyperlipidemia, hypertension, atrial fibrillation on Eliquis, hypothyroidism, presenting today for an unwitnessed fall as a level 2 trauma alert.  Upon arrival of EMS, no staff was available for report.  Patient is unable to offer any significant history.  They noted an obvious injury to her right eyebrow from a fall today.  Dressing was placed prior to arrival.  Is unclear as to whether or not her mentation is at baseline at this time.  She will complain of some pain to her eyebrow but denies any further symptoms at this time.  She is oriented to self.  Home Medications Prior to Admission medications   Medication Sig Start Date End Date Taking? Authorizing Provider  dorzolamide (TRUSOPT) 2 % ophthalmic solution Place 1 drop into both eyes 2 (two) times daily. 07/24/22  Yes [provider]  escitalopram (LEXAPRO) 5 MG tablet Take 5 mg by mouth daily. 10/15/22  Yes [provider]  apixaban (ELIQUIS) 2.5 MG TABS tablet Take 1 tablet (2.5 mg total) by mouth 2 (two) times daily. 09/16/22 10/16/22  Hughie Closs, MD  Ascorbic Acid (VITAMIN C PO) Take 1 tablet by mouth daily.     [provider]  benazepril (LOTENSIN) 20 MG tablet Take 20 mg by mouth daily.    [provider]  busPIRone (BUSPAR) 5 MG tablet Take 5 mg by mouth 2 (two) times daily.    [provider]  cefTRIAXone (ROCEPHIN) IVPB Inject 1 g into the vein every 12 (twelve) hours.    [provider]  divalproex (DEPAKOTE SPRINKLE) 125 MG capsule Take 1 capsule (125 mg total) by  mouth every 8 (eight) hours. 08/29/22 10/01/22  Calvert Cantor, MD  feeding supplement (ENSURE ENLIVE / ENSURE PLUS) LIQD Take 237 mLs by mouth 3 (three) times daily between meals. 08/29/22   Calvert Cantor, MD  HEMMOREX-HC 25 MG suppository Place 25 mg rectally 2 (two) times daily as needed for hemorrhoids. 09/28/22   [provider]  latanoprost (XALATAN) 0.005 % ophthalmic solution Place 1 drop into both eyes at bedtime.     [provider]  levothyroxine (SYNTHROID, LEVOTHROID) 100 MCG tablet Take 1 tablet (100 mcg total) by mouth daily. Patient taking differently: Take 100 mcg by mouth daily before breakfast. 02/19/14   Angiulli, Mcarthur Rossetti, PA-C  metoprolol succinate (TOPROL-XL) 50 MG 24 hr tablet Take 1 tablet (50 mg total) by mouth daily. Take with or immediately following a meal. 08/30/22   Calvert Cantor, MD  Multiple Vitamin (MULTIVITAMIN) capsule Take 1 capsule by mouth daily. Centrum silver    [provider]  Multiple Vitamins-Minerals (CENTRUM SILVER PO) Take 1 tablet by mouth daily.    [provider]  Omega-3 Fatty Acids (FISH OIL) 1000 MG CAPS Take 1,000 mg by mouth daily.     [provider]  pilocarpine (PILOCAR) 1 % ophthalmic solution Place 1 drop into both eyes 4 (four) times daily.  08/06/16   [provider]  timolol (TIMOPTIC) 0.5 % ophthalmic solution Place 1 drop into both  eyes at bedtime. 03/18/22   [provider]  TUBERCULIN PPD ID Inject 5 Units into the skin. 0.1 ml intradermally every evening    [provider]      Allergies    Patient has no known allergies.    Review of Systems   Review of Systems Unable to obtain secondary to mental status  Physical Exam Updated Vital Signs BP 130/86   Pulse 69   Temp 98.1 F (36.7 C) (Oral)   Resp 19   SpO2 100%  Physical Exam Vitals and nursing note reviewed.  Constitutional:      General: She is not in acute distress.    Appearance: She is well-developed.   HENT:     Head: Normocephalic.     Comments: Hematoma with 2 cm laceration over the right eyebrow.  Bleeding controlled.    Mouth/Throat:     Mouth: Mucous membranes are moist.  Eyes:     Conjunctiva/sclera: Conjunctivae normal.     Pupils: Pupils are equal, round, and reactive to light.  Neck:     Comments: No cervical spine tenderness.  Placed in cervical collar Cardiovascular:     Rate and Rhythm: Normal rate. Rhythm irregular.     Heart sounds: No murmur heard.    Comments: No chest wall tenderness Pulmonary:     Effort: Pulmonary effort is normal. No respiratory distress.     Breath sounds: Normal breath sounds. No wheezing.     Comments: Saturating well on room air Abdominal:     General: Abdomen is flat. There is no distension.     Palpations: Abdomen is soft.     Tenderness: There is no abdominal tenderness. There is no guarding.  Musculoskeletal:        General: No swelling or tenderness.     Cervical back: Neck supple.  Skin:    General: Skin is warm and dry.     Capillary Refill: Capillary refill takes less than 2 seconds.     Comments: Small skin tear appreciated over the right hand and left forearm.  Faint abrasions appreciated over bilateral anterior knees  Neurological:     Mental Status: She is alert.  Psychiatric:        Mood and Affect: Mood normal.     ED Results / Procedures / Treatments   Labs (all labs ordered are listed, but only abnormal results are displayed) Labs Reviewed  COMPREHENSIVE METABOLIC PANEL - Abnormal; Notable for the following components:      Result Value   Glucose, Bld 135 (*)    BUN 28 (*)    Creatinine, Ser 1.59 (*)    Calcium 8.6 (*)    Total Protein 6.4 (*)    Albumin 3.0 (*)    AST 52 (*)    GFR, Estimated 30 (*)    All other components within normal limits  CBC - Abnormal; Notable for the following components:   HCT 48.2 (*)    RDW 20.2 (*)    Platelets 138 (*)    All other components within normal limits   PROTIME-INR - Abnormal; Notable for the following components:   Prothrombin Time 17.1 (*)    INR 1.4 (*)    All other components within normal limits  I-STAT CHEM 8, ED - Abnormal; Notable for the following components:   BUN 29 (*)    Creatinine, Ser 1.60 (*)    Glucose, Bld 133 (*)    Calcium, Ion 1.01 (*)  Hemoglobin 15.6 (*)    All other components within normal limits  CK  URINALYSIS, ROUTINE W REFLEX MICROSCOPIC  CBC WITH DIFFERENTIAL/PLATELET  COMPREHENSIVE METABOLIC PANEL  MAGNESIUM  MAGNESIUM  PHOSPHORUS  SAMPLE TO BLOOD BANK    EKG EKG Interpretation Date/Time:  Friday October 16 2022 21:32:49 EDT Ventricular Rate:  74 PR Interval:    QRS Duration:  109 QT Interval:  388 QTC Calculation: 431 R Axis:   121  Text Interpretation: Atrial fibrillation Ventricular premature complex Right axis deviation Borderline low voltage, extremity leads Borderline repolarization abnormality Confirmed by Pricilla Loveless [phone]) on 10/16/2022 10:19:32 PM  Radiology CT T-SPINE NO CHARGE  Result Date: 10/16/2022 CLINICAL DATA:  Fall with blunt polytrauma. EXAM: CT THORACIC AND LUMBAR SPINE WITHOUT CONTRAST TECHNIQUE: Multidetector CT imaging of the thoracic and lumbar spine was performed without intravenous contrast. Multiplanar CT image reconstructions were also generated. RADIATION DOSE REDUCTION: This exam was performed according to the departmental dose-optimization program which includes automated exposure control, adjustment of the mA and/or kV according to patient size and/or use of iterative reconstruction technique. COMPARISON:  CT abdomen and pelvis and reconstructions dated 10/01/2022, contemporaneous chest CT. FINDINGS: CT THORACIC SPINE FINDINGS Alignment: There is mild thoracic kyphodextroscoliosis, without AP listhesis. Vertebrae: No acute fracture or focal pathologic process is seen. There is osteopenia. There is spondylosis, with multilevel right anterolateral bridging  enthesopathy starting at T3 and continuing through T10-11. Paraspinal and other soft tissues: No acute findings. Small right-greater-than-left pleural effusions are unchanged since 10/01/2022. No paraspinal hematoma or reactive change. Disc levels: The thoracic discs are degenerated throughout 2 varying degrees. The greatest disc collapse at T5-6, T6-7, T7-8 and T8-9. No herniated discs or cord compromise are suspected. Dorsal ligamentous hypertrophic ossification on the right impresses mildly on the right dorsolateral thecal sac at T11-12. There are slight facet spurring changes at the lower thoracic levels, but no thoracic spine levels demonstrate significant foraminal encroachment. CT LUMBAR SPINE FINDINGS Segmentation: 5 lumbar type vertebrae. Alignment: Chronic grade 1 L5-S1 anterolisthesis is again noted due to chronic L5 pars defects. Alignment is otherwise within normal limits. Vertebrae: There is osteopenia. There is preservation of the normal vertebral body heights. No acute lumbar fracture is seen or focal pathologic process. There is moderate marginal osteophytosis, most prominently from L3-4 down. Paraspinal and other soft tissues: Heavy aortoiliac atherosclerosis. No acute findings. Disc levels: The discs are variably degenerated, with vacuum phenomenon noted at L2-3, L4-5 and L5-S1. The greatest disc space loss is at the lowest 2 levels, with relatively mild disc space loss at L1-2, L2-3 and L3-4. There is normal disc height at T12-L1. L1-2: There has a posterior disc osteophyte complex causing mild spinal canal stenosis without significant lateral recess encroachment. There are right-greater-than-left facet spurs but no foraminal stenosis. L2-3: Severe acquired spinal stenosis again noted due to circumferential disc osteophyte complex, advanced facet hypertrophy, and ligamentous thickening. Both lateral recesses are effaced. There is moderate right and mild left foraminal stenosis. L3-4: There is a  calcified central disc extrusion. Again noted severe acquired spinal stenosis due to the disc extrusion, superimposed circumferential disc osteophyte complex, advanced facet hypertrophy, with ligamentous thickening. Both lateral recesses are effaced. There is moderate to severe right and mild left foraminal stenosis. L4-5: Mild-to-moderate facet hypertrophy partially effaces the lateral recesses. There is a mild nonstenosing posterior disc osteophyte complex without herniation. There is moderate right-greater-than-left foraminal stenosis. L5-S1: Chronic L5 pars defects. Grade 1 L5-S1 spondylolisthesis with disc collapse and vacuum phenomenon. No  herniated disc. Disc space air again extends into the right lateral recess. There are bilateral facet spurs. There is bilateral severe acquired foraminal stenosis. There is ankylosis across the left SI joint, spurring of both SI joints are patent right SI joint. No sacral insufficiency fracture is seen. IMPRESSION: 1. Thoracic spine degenerative changes and mild kyphodextroscoliosis without evidence of acute fractures. 2. Small right-greater-than-left pleural effusions, unchanged since 10/01/2022. 3. Lumbar osteopenia and degenerative change without evidence of acute fractures. 4. Chronic L5 pars defects with grade 1 L5-S1 spondylolisthesis. 5. Severe acquired spinal stenosis at L2-3 and L3-4 with bilateral lateral recess effacement, and bilateral foraminal stenosis. 6. Lateral recess effacement at L4-5 with moderate right-greater-than-left foraminal stenosis. 7. Severe bilateral L5-S1 foraminal stenosis. 8. Aortic atherosclerosis. Aortic Atherosclerosis (ICD10-I70.0). Electronically Signed   By: Almira Bar M.D.   On: 10/16/2022 23:18   CT L-SPINE NO CHARGE  Result Date: 10/16/2022 CLINICAL DATA:  Fall with blunt polytrauma. EXAM: CT THORACIC AND LUMBAR SPINE WITHOUT CONTRAST TECHNIQUE: Multidetector CT imaging of the thoracic and lumbar spine was performed without  intravenous contrast. Multiplanar CT image reconstructions were also generated. RADIATION DOSE REDUCTION: This exam was performed according to the departmental dose-optimization program which includes automated exposure control, adjustment of the mA and/or kV according to patient size and/or use of iterative reconstruction technique. COMPARISON:  CT abdomen and pelvis and reconstructions dated 10/01/2022, contemporaneous chest CT. FINDINGS: CT THORACIC SPINE FINDINGS Alignment: There is mild thoracic kyphodextroscoliosis, without AP listhesis. Vertebrae: No acute fracture or focal pathologic process is seen. There is osteopenia. There is spondylosis, with multilevel right anterolateral bridging enthesopathy starting at T3 and continuing through T10-11. Paraspinal and other soft tissues: No acute findings. Small right-greater-than-left pleural effusions are unchanged since 10/01/2022. No paraspinal hematoma or reactive change. Disc levels: The thoracic discs are degenerated throughout 2 varying degrees. The greatest disc collapse at T5-6, T6-7, T7-8 and T8-9. No herniated discs or cord compromise are suspected. Dorsal ligamentous hypertrophic ossification on the right impresses mildly on the right dorsolateral thecal sac at T11-12. There are slight facet spurring changes at the lower thoracic levels, but no thoracic spine levels demonstrate significant foraminal encroachment. CT LUMBAR SPINE FINDINGS Segmentation: 5 lumbar type vertebrae. Alignment: Chronic grade 1 L5-S1 anterolisthesis is again noted due to chronic L5 pars defects. Alignment is otherwise within normal limits. Vertebrae: There is osteopenia. There is preservation of the normal vertebral body heights. No acute lumbar fracture is seen or focal pathologic process. There is moderate marginal osteophytosis, most prominently from L3-4 down. Paraspinal and other soft tissues: Heavy aortoiliac atherosclerosis. No acute findings. Disc levels: The discs are  variably degenerated, with vacuum phenomenon noted at L2-3, L4-5 and L5-S1. The greatest disc space loss is at the lowest 2 levels, with relatively mild disc space loss at L1-2, L2-3 and L3-4. There is normal disc height at T12-L1. L1-2: There has a posterior disc osteophyte complex causing mild spinal canal stenosis without significant lateral recess encroachment. There are right-greater-than-left facet spurs but no foraminal stenosis. L2-3: Severe acquired spinal stenosis again noted due to circumferential disc osteophyte complex, advanced facet hypertrophy, and ligamentous thickening. Both lateral recesses are effaced. There is moderate right and mild left foraminal stenosis. L3-4: There is a calcified central disc extrusion. Again noted severe acquired spinal stenosis due to the disc extrusion, superimposed circumferential disc osteophyte complex, advanced facet hypertrophy, with ligamentous thickening. Both lateral recesses are effaced. There is moderate to severe right and mild left foraminal stenosis. L4-5: Mild-to-moderate  facet hypertrophy partially effaces the lateral recesses. There is a mild nonstenosing posterior disc osteophyte complex without herniation. There is moderate right-greater-than-left foraminal stenosis. L5-S1: Chronic L5 pars defects. Grade 1 L5-S1 spondylolisthesis with disc collapse and vacuum phenomenon. No herniated disc. Disc space air again extends into the right lateral recess. There are bilateral facet spurs. There is bilateral severe acquired foraminal stenosis. There is ankylosis across the left SI joint, spurring of both SI joints are patent right SI joint. No sacral insufficiency fracture is seen. IMPRESSION: 1. Thoracic spine degenerative changes and mild kyphodextroscoliosis without evidence of acute fractures. 2. Small right-greater-than-left pleural effusions, unchanged since 10/01/2022. 3. Lumbar osteopenia and degenerative change without evidence of acute fractures. 4.  Chronic L5 pars defects with grade 1 L5-S1 spondylolisthesis. 5. Severe acquired spinal stenosis at L2-3 and L3-4 with bilateral lateral recess effacement, and bilateral foraminal stenosis. 6. Lateral recess effacement at L4-5 with moderate right-greater-than-left foraminal stenosis. 7. Severe bilateral L5-S1 foraminal stenosis. 8. Aortic atherosclerosis. Aortic Atherosclerosis (ICD10-I70.0). Electronically Signed   By: Almira Bar M.D.   On: 10/16/2022 23:18   CT MAXILLOFACIAL WO CONTRAST  Result Date: 10/16/2022 CLINICAL DATA:  Level 2 fall with blunt poly fusion trauma on blood thinners. EXAM: CT MAXILLOFACIAL WITHOUT CONTRAST CT CHEST, ABDOMEN AND PELVIS WITH CONTRAST TECHNIQUE: Multidetector CT imaging of the maxillofacial structures was performed. Multiplanar CT image reconstructions were also generated. A small metallic BB was placed on the right temple in order to reliably differentiate right from left. Multidetector CT imaging of the chest, abdomen and pelvis was performed following the standard protocol during bolus administration of intravenous contrast. RADIATION DOSE REDUCTION: This exam was performed according to the departmental dose-optimization program which includes automated exposure control, adjustment of the mA and/or kV according to patient size and/or use of iterative reconstruction technique. CONTRAST:  60mL OMNIPAQUE IOHEXOL 350 MG/ML SOLN COMPARISON:  CT scan head without contrast 09/30/2021, CT abdomen and pelvis with contrast 10/01/2022, chest CT with contrast 08/19/2000, abdomen and pelvis CT no contrast 02/04/2006. FINDINGS: CT MAXILLOFACIAL FINDINGS Osseous: Chronic slightly depressed fracture, right side of the nasal bone. No evidence of acute facial fracture. No mandibular dislocation is seen. There is asymmetric joint space loss and remodeling of the left TMJ. There is osteopenia. Orbits: Negative. No traumatic or inflammatory finding. Again noted are old lens replacements.  Sinuses: There is mild membrane thickening in the paranasal sinuses without fluid levels. There is a broad defect in the cartilaginous nasal septum which may be postsurgical. There is a small defect in the medial wall of the right maxillary sinus alongside the middle nasal meatus which also may be postsurgical such as due to prior meatotomy. Both of the ostiomeatal complexes are clear. The turbinates are intact. Moderate to severe deviation of the nasal septum to the left is again noted. The mastoid air cells are clear.  The middle ears are clear. Soft tissues: There is laceration in the lateral aspect of the right brow with underlying soft tissue gas. There is a small amount of air in the left external jugular vein and right IJ vein, most likely injected placement of the patient's IV. The epiglottis and tongue base appear normal. Both proximal cervical ICAs are heavily calcified with likely flow-limiting stenoses. CT CHEST FINDINGS Cardiovascular: Moderate to severe cardiomegaly is again noted with a right chamber predominance indicating likely chronic right heart dysfunction. There are three-vessel coronary artery calcifications. No pericardial effusion. Pulmonary arteries are normal caliber and centrally clear. There are prominent  superior pulmonary veins. There is aortic atherosclerosis tortuosity and scattered calcific plaques in the great vessels but no stenosis, dissection or aneurysm. Mediastinum/Nodes: No enlarged mediastinal, hilar, or axillary lymph nodes. Thyroid gland, trachea, and esophagus demonstrate no significant findings. Lungs/Pleura: Again noted are small right-greater-than-left layering pleural effusions, with adjacent atelectasis in the lower lobes, unchanged. Small amount of apical pleural-parenchymal scarring is similar to 2002. There is diffuse bronchial thickening. The lungs demonstrate mild mosaicism most likely due to small airways disease with air trapping. Scattered linear scar-like  opacities both bases. Again there appears to be slight interstitial edema in both lung bases but I do not see evidence for airspace edema. This was seen previously.  No suspicious pulmonary nodule. Musculoskeletal: There is osteopenia, degenerative changes of the spine including multilevel bridging enthesopathy, and chronic mild thoracic kyphodextroscoliosis. No spinal compression fracture is seen. The ribcage, shoulder girdles and sternum demonstrate no evidence of an acute fracture. There is a nondisplaced subacute fracture of the anterolateral right fifth rib with evidence of early healing. This is above the plane of the prior abdomen pelvis CT. There are dystrophic calcifications in both breasts consistent with fibrocystic disease but no chest wall hematoma or mass. CT ABDOMEN AND PELVIS FINDINGS Hepatobiliary: Single small stone in the proximal gallbladder. No wall thickening. No biliary dilatation. Unremarkable liver without obvious laceration or adjacent hematoma. Pancreas: Partially atrophic.  Otherwise unremarkable. Spleen: No abnormality. Adrenals/Urinary Tract: Adrenal glands are unremarkable. Kidneys are normal, without renal calculi, focal lesion, or hydronephrosis. Bladder is unremarkable. Stomach/Bowel: No dilatation or wall thickening. Moderate fecal stasis. Sigmoid diverticulosis without evidence of diverticulitis. Normal caliber appendix. Vascular/Lymphatic: Heavy aortoiliac calcific plaque. Multifocal ostial branch vessel atherosclerosis. No AAA. No adenopathy. Reproductive: Uterus and bilateral adnexa are unremarkable. Other: Trace ascites in the posterior deep pelvis. Mild increased edema extends along the body wall, greater in the dependent areas. There is no free hemorrhage, free air, or focal inflammatory process. Small umbilical fat hernia. Musculoskeletal: Chronic L5 pars defects and grade 1 L5-S1 spondylolisthesis with chronic disc collapse with vacuum phenomenon. Osteopenia and  degenerative change remaining lumbar spine. No acute regional skeletal fractures. Ankylosis left SI joint. Severe acquired spinal stenosis at L2-3, L3-4. IMPRESSION: 1. Laceration in the lateral right brow with underlying soft tissue gas. No acute facial or orbital fractures. 2. Osteopenia and degenerative change. 3. Cardiomegaly with right chamber predominance, prominent superior pulmonary veins, small right-greater-than-left layering pleural effusions with probable slight interstitial edema in the lung bases. Similar findings on abdomen and pelvis CT 10/01/2022. 4. Bronchitis with mosaicism in the lungs most likely due to small airways disease with air trapping. 5. Aortic and coronary artery atherosclerosis. 6. Cholelithiasis. 7. Constipation and diverticulosis. 8. Trace ascites in the posterior deep pelvis. No free air or focal inflammatory process. Increased body wall edema. 9. Chronic L5 pars defects with grade 1 L5-S1 spondylolisthesis and chronic disc collapse. 10. Severe acquired spinal stenosis at L2-3 and L3-4. 11. Subacute nondisplaced fracture of the anterolateral right fifth rib with evidence of early healing. No acute regional skeletal fractures are seen. 12. Small amount of air in the left EJ vein and right IJ vein, most likely iatrogenic. Aortic Atherosclerosis (ICD10-I70.0). Electronically Signed   By: Almira Bar M.D.   On: 10/16/2022 22:47   CT CHEST ABDOMEN PELVIS W CONTRAST  Result Date: 10/16/2022 CLINICAL DATA:  Level 2 fall with blunt poly fusion trauma on blood thinners. EXAM: CT MAXILLOFACIAL WITHOUT CONTRAST CT CHEST, ABDOMEN AND PELVIS WITH CONTRAST TECHNIQUE:  Multidetector CT imaging of the maxillofacial structures was performed. Multiplanar CT image reconstructions were also generated. A small metallic BB was placed on the right temple in order to reliably differentiate right from left. Multidetector CT imaging of the chest, abdomen and pelvis was performed following the standard  protocol during bolus administration of intravenous contrast. RADIATION DOSE REDUCTION: This exam was performed according to the departmental dose-optimization program which includes automated exposure control, adjustment of the mA and/or kV according to patient size and/or use of iterative reconstruction technique. CONTRAST:  60mL OMNIPAQUE IOHEXOL 350 MG/ML SOLN COMPARISON:  CT scan head without contrast 09/30/2021, CT abdomen and pelvis with contrast 10/01/2022, chest CT with contrast 08/19/2000, abdomen and pelvis CT no contrast 02/04/2006. FINDINGS: CT MAXILLOFACIAL FINDINGS Osseous: Chronic slightly depressed fracture, right side of the nasal bone. No evidence of acute facial fracture. No mandibular dislocation is seen. There is asymmetric joint space loss and remodeling of the left TMJ. There is osteopenia. Orbits: Negative. No traumatic or inflammatory finding. Again noted are old lens replacements. Sinuses: There is mild membrane thickening in the paranasal sinuses without fluid levels. There is a broad defect in the cartilaginous nasal septum which may be postsurgical. There is a small defect in the medial wall of the right maxillary sinus alongside the middle nasal meatus which also may be postsurgical such as due to prior meatotomy. Both of the ostiomeatal complexes are clear. The turbinates are intact. Moderate to severe deviation of the nasal septum to the left is again noted. The mastoid air cells are clear.  The middle ears are clear. Soft tissues: There is laceration in the lateral aspect of the right brow with underlying soft tissue gas. There is a small amount of air in the left external jugular vein and right IJ vein, most likely injected placement of the patient's IV. The epiglottis and tongue base appear normal. Both proximal cervical ICAs are heavily calcified with likely flow-limiting stenoses. CT CHEST FINDINGS Cardiovascular: Moderate to severe cardiomegaly is again noted with a right  chamber predominance indicating likely chronic right heart dysfunction. There are three-vessel coronary artery calcifications. No pericardial effusion. Pulmonary arteries are normal caliber and centrally clear. There are prominent superior pulmonary veins. There is aortic atherosclerosis tortuosity and scattered calcific plaques in the great vessels but no stenosis, dissection or aneurysm. Mediastinum/Nodes: No enlarged mediastinal, hilar, or axillary lymph nodes. Thyroid gland, trachea, and esophagus demonstrate no significant findings. Lungs/Pleura: Again noted are small right-greater-than-left layering pleural effusions, with adjacent atelectasis in the lower lobes, unchanged. Small amount of apical pleural-parenchymal scarring is similar to 2002. There is diffuse bronchial thickening. The lungs demonstrate mild mosaicism most likely due to small airways disease with air trapping. Scattered linear scar-like opacities both bases. Again there appears to be slight interstitial edema in both lung bases but I do not see evidence for airspace edema. This was seen previously.  No suspicious pulmonary nodule. Musculoskeletal: There is osteopenia, degenerative changes of the spine including multilevel bridging enthesopathy, and chronic mild thoracic kyphodextroscoliosis. No spinal compression fracture is seen. The ribcage, shoulder girdles and sternum demonstrate no evidence of an acute fracture. There is a nondisplaced subacute fracture of the anterolateral right fifth rib with evidence of early healing. This is above the plane of the prior abdomen pelvis CT. There are dystrophic calcifications in both breasts consistent with fibrocystic disease but no chest wall hematoma or mass. CT ABDOMEN AND PELVIS FINDINGS Hepatobiliary: Single small stone in the proximal gallbladder. No wall thickening. No  biliary dilatation. Unremarkable liver without obvious laceration or adjacent hematoma. Pancreas: Partially atrophic.   Otherwise unremarkable. Spleen: No abnormality. Adrenals/Urinary Tract: Adrenal glands are unremarkable. Kidneys are normal, without renal calculi, focal lesion, or hydronephrosis. Bladder is unremarkable. Stomach/Bowel: No dilatation or wall thickening. Moderate fecal stasis. Sigmoid diverticulosis without evidence of diverticulitis. Normal caliber appendix. Vascular/Lymphatic: Heavy aortoiliac calcific plaque. Multifocal ostial branch vessel atherosclerosis. No AAA. No adenopathy. Reproductive: Uterus and bilateral adnexa are unremarkable. Other: Trace ascites in the posterior deep pelvis. Mild increased edema extends along the body wall, greater in the dependent areas. There is no free hemorrhage, free air, or focal inflammatory process. Small umbilical fat hernia. Musculoskeletal: Chronic L5 pars defects and grade 1 L5-S1 spondylolisthesis with chronic disc collapse with vacuum phenomenon. Osteopenia and degenerative change remaining lumbar spine. No acute regional skeletal fractures. Ankylosis left SI joint. Severe acquired spinal stenosis at L2-3, L3-4. IMPRESSION: 1. Laceration in the lateral right brow with underlying soft tissue gas. No acute facial or orbital fractures. 2. Osteopenia and degenerative change. 3. Cardiomegaly with right chamber predominance, prominent superior pulmonary veins, small right-greater-than-left layering pleural effusions with probable slight interstitial edema in the lung bases. Similar findings on abdomen and pelvis CT 10/01/2022. 4. Bronchitis with mosaicism in the lungs most likely due to small airways disease with air trapping. 5. Aortic and coronary artery atherosclerosis. 6. Cholelithiasis. 7. Constipation and diverticulosis. 8. Trace ascites in the posterior deep pelvis. No free air or focal inflammatory process. Increased body wall edema. 9. Chronic L5 pars defects with grade 1 L5-S1 spondylolisthesis and chronic disc collapse. 10. Severe acquired spinal stenosis at L2-3  and L3-4. 11. Subacute nondisplaced fracture of the anterolateral right fifth rib with evidence of early healing. No acute regional skeletal fractures are seen. 12. Small amount of air in the left EJ vein and right IJ vein, most likely iatrogenic. Aortic Atherosclerosis (ICD10-I70.0). Electronically Signed   By: Almira Bar M.D.   On: 10/16/2022 22:47   CT CERVICAL SPINE WO CONTRAST  Result Date: 10/16/2022 CLINICAL DATA:  Polytrauma, blunt.  Fall EXAM: CT CERVICAL SPINE WITHOUT CONTRAST TECHNIQUE: Multidetector CT imaging of the cervical spine was performed without intravenous contrast. Multiplanar CT image reconstructions were also generated. RADIATION DOSE REDUCTION: This exam was performed according to the departmental dose-optimization program which includes automated exposure control, adjustment of the mA and/or kV according to patient size and/or use of iterative reconstruction technique. COMPARISON:  None Available. FINDINGS: Alignment: Normal Skull base and vertebrae: No acute fracture. No primary bone lesion or focal pathologic process. Soft tissues and spinal canal: No prevertebral fluid or swelling. No visible canal hematoma. Disc levels: Diffuse degenerative disc disease, most pronounced in the lower cervical spine. Diffuse advanced degenerative facet disease. Upper chest: Biapical scarring.  No acute findings. Other: None IMPRESSION: Degenerative disc and facet disease.  No acute bony abnormality. Electronically Signed   By: Charlett Nose M.D.   On: 10/16/2022 22:10   CT HEAD WO CONTRAST  Result Date: 10/16/2022 CLINICAL DATA:  Head trauma, moderate-severe. Fall. On blood thinners. EXAM: CT HEAD WITHOUT CONTRAST TECHNIQUE: Contiguous axial images were obtained from the base of the skull through the vertex without intravenous contrast. RADIATION DOSE REDUCTION: This exam was performed according to the departmental dose-optimization program which includes automated exposure control, adjustment  of the mA and/or kV according to patient size and/or use of iterative reconstruction technique. 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 There is atrophy and chronic small  vessel disease changes. No acute intracranial abnormality. Specifically, no hemorrhage, hydrocephalus, mass lesion, acute infarction, or significant intracranial injury. Vascular: No hyperdense vessel or unexpected calcification. Skull: No acute calvarial abnormality. Sinuses/Orbits: No acute findings Other: None IMPRESSION: Old right parietal infarct. Atrophy, chronic microvascular disease. No acute intracranial abnormality. Electronically Signed   By: Charlett Nose M.D.   On: 10/16/2022 22:07   DG Pelvis Portable  Result Date: 10/16/2022 CLINICAL DATA:  Trauma level 2 fall EXAM: PORTABLE PELVIS 1-2 VIEWS COMPARISON:  10/01/2022 FINDINGS: SI joints show mild degenerative change but no widening. Pubic symphysis and rami appear intact. No fracture or malalignment. Moderate bilateral hip degenerative changes IMPRESSION: No acute osseous abnormality. Electronically Signed   By: Jasmine Pang M.D.   On: 10/16/2022 21:53   DG Chest Portable 1 View  Result Date: 10/16/2022 CLINICAL DATA:  Trauma level 2 fall EXAM: PORTABLE CHEST 1 VIEW COMPARISON:  10/01/2022, CT 08/19/2000 FINDINGS: Cardiomegaly with mild diffuse interstitial opacities possible edema. Possible left effusion. Aortic atherosclerosis. No convincing pneumothorax IMPRESSION: Cardiomegaly with mild interstitial edema and possible small left effusion. Electronically Signed   By: Jasmine Pang M.D.   On: 10/16/2022 21:49    Procedures .Marland KitchenLaceration Repair  Date/Time: 10/16/2022 11:58 PM  Performed by: Rhys Martini, DO Authorized by: Pricilla Loveless, MD   Universal protocol:    Patient identity confirmed:  Verbally with patient and arm band Anesthesia:    Anesthesia method:  Topical application   Topical anesthetic:   LET Laceration details:    Location:  Face   Face location:  R eyebrow   Length (cm):  2 Pre-procedure details:    Preparation:  Patient was prepped and draped in usual sterile fashion Treatment:    Area cleansed with:  Saline   Amount of cleaning:  Standard   Irrigation solution:  Sterile saline   Irrigation method:  Syringe   Visualized foreign bodies/material removed: no   Skin repair:    Repair method:  Sutures   Suture size:  5-0   Suture material:  Fast-absorbing gut   Suture technique:  Simple interrupted   Number of sutures:  2 Approximation:    Approximation:  Close Repair type:    Repair type:  Simple Post-procedure details:    Procedure completion:  Tolerated well, no immediate complications     Medications Ordered in ED Medications  acetaminophen (TYLENOL) tablet 650 mg (has no administration in time range)    Or  acetaminophen (TYLENOL) suppository 650 mg (has no administration in time range)  melatonin tablet 3 mg (has no administration in time range)  ondansetron (ZOFRAN) injection 4 mg (has no administration in time range)  iohexol (OMNIPAQUE) 350 MG/ML injection 60 mL (60 mLs Intravenous Contrast Given 10/16/22 2158)  lidocaine-EPINEPHrine-tetracaine (LET) topical gel (3 mLs Topical Given 10/16/22 2209)    ED Course/ Medical Decision Making/ A&P                                Medical Decision Making Problems Addressed: Fall, initial encounter: complicated acute illness or injury  Amount and/or Complexity of Data Reviewed Independent Historian: EMS Labs: ordered. Radiology: ordered and independent interpretation performed.  Risk Prescription drug management. Decision regarding hospitalization.   DEEANDRA HELING is a 89 y.o. female who presented to the ED by EMS as an activated Level 2 trauma for an unwitnessed fall.  Prior to arrival of the patient, the room was prepared  with the following: code cart to bedside, glidescope, suction, BVM.   Upon  arrival of the patient, EMS provided pertinent history and exam findings. The patient was transferred over to the trauma bed. ABCs intact as exam below. Once IVs were established, the secondary exam was performed. Pertinent physical exam findings include hematoma with 2 cm laceration noted over the right eyebrow. Portable XRs performed at the bedside. The patient was then prepared and sent to the CT for trauma scans.   Full trauma scans were performed with no significant findings, with the exception of right greater than left pleural effusions.   Eyebrow laceration repaired as further described above.  Trauma labs reveal AKI with creatinine of 1.59 increased from baseline of 1.2.  INR is 1.4.  CK is normal.   On reevaluation, patient is now requiring 2L O2 via nasal cannula due to hypoxia.  The patient will be admitted to the hospitalist service for AKI, recurrent falls, and hypoxia likely secondary to bilateral pleural effusions.  The plan for this patient was discussed with Dr. Criss Alvine, who voiced agreement and who oversaw evaluation and treatment of this patient.     Final Clinical Impression(s) / ED Diagnoses Final diagnoses:  Fall, initial encounter  AKI (acute kidney injury) [facility])  Facial laceration, initial encounter    Rx / DC Orders ED Discharge Orders     None         Rhys Martini, DO 10/17/22 0002    Pricilla Loveless, MD 10/17/22 [phone]

## 2022-10-16 NOTE — ED Triage Notes (Signed)
Patient BIB [facility] from [place] due to fall on thinners. No staff no gave report to EMS so unsure of it was witnessed or unwitnessed, Unknown LOC. Patient takes Eliquis. Patient Alert but confused, unsure if baseline or not. Patient has laceration above right eye. VSS.

## 2022-10-16 NOTE — H&P (Signed)
History and Physical      Amanda Cochran NWG:[id number] DOB: [birth year] DOA: 10/16/2022; DOS: 10/16/2022  PCP: [name], MD *** Patient coming from: home ***  I have personally briefly reviewed patient's old medical records in [facility]  Chief Complaint: ***  HPI: Amanda Cochran is a 89 y.o. female with medical history significant for *** who is admitted to [facility] on 10/16/2022 with *** after presenting from home*** to [facility] ED complaining of ***.    ***       ***   ED Course:  Vital signs in the ED were notable for the following: ***  Labs were notable for the following: ***  Per my interpretation, EKG in ED demonstrated the following:  ***  Imaging in the ED, per corresponding formal radiology read, was notable for the following:  ***  While in the ED, the following were administered: ***  Subsequently, the patient was admitted  ***  ***red    Review of Systems: As per HPI otherwise 10 point review of systems negative.   Past Medical History:  Diagnosis Date   Arthritis    Bladder infection    Blindness    CVA (cerebral infarction) 02/07/2014   Left basal ganglia infarct with right hemiparesis    Glaucoma    Glaucoma    Glaucoma (increased eye pressure)    Hyperlipidemia    Hypertension    Left sided lacunar stroke (HCC) 02/14/2014   Right hemiparesis (HCC) 02/09/2014   Left basal ganglia infarct Dec 2015    Stroke [facility])    Thyroid disease     Past Surgical History:  Procedure Laterality Date   cataract surgery Bilateral 2015   THYROID SURGERY  1976   TONSILLECTOMY AND ADENOIDECTOMY     VAGINAL PROLAPSE REPAIR      Social History:  reports that she has never smoked. She has never used smokeless tobacco. She reports that she does not drink alcohol and does not use drugs.   No Known Allergies  Family History  Problem Relation Age of Onset   Heart failure Mother    AAA (abdominal aortic aneurysm) Father     Family  history reviewed and not pertinent ***   Prior to Admission medications   Medication Sig Start Date End Date Taking? Authorizing Provider  dorzolamide (TRUSOPT) 2 % ophthalmic solution Place 1 drop into both eyes 2 (two) times daily. 07/24/22  Yes [provider]  escitalopram (LEXAPRO) 5 MG tablet Take 5 mg by mouth daily. 10/15/22  Yes [provider]  apixaban (ELIQUIS) 2.5 MG TABS tablet Take 1 tablet (2.5 mg total) by mouth 2 (two) times daily. 09/16/22 10/16/22  Hughie Closs, MD  Ascorbic Acid (VITAMIN C PO) Take 1 tablet by mouth daily.     [provider]  benazepril (LOTENSIN) 20 MG tablet Take 20 mg by mouth daily.    [provider]  busPIRone (BUSPAR) 5 MG tablet Take 5 mg by mouth 2 (two) times daily.    [provider]  cefTRIAXone (ROCEPHIN) IVPB Inject 1 g into the vein every 12 (twelve) hours.    [provider]  divalproex (DEPAKOTE SPRINKLE) 125 MG capsule Take 1 capsule (125 mg total) by mouth every 8 (eight) hours. 08/29/22 10/01/22  Calvert Cantor, MD  feeding supplement (ENSURE ENLIVE / ENSURE PLUS) LIQD Take 237 mLs by mouth 3 (three) times daily between meals. 08/29/22   Calvert Cantor, MD  HEMMOREX-HC 25 MG suppository  Place 25 mg rectally 2 (two) times daily as needed for hemorrhoids. 09/28/22   [provider]  latanoprost (XALATAN) 0.005 % ophthalmic solution Place 1 drop into both eyes at bedtime.     [provider]  levothyroxine (SYNTHROID, LEVOTHROID) 100 MCG tablet Take 1 tablet (100 mcg total) by mouth daily. Patient taking differently: Take 100 mcg by mouth daily before breakfast. 02/19/14   Angiulli, Mcarthur Rossetti, PA-C  metoprolol succinate (TOPROL-XL) 50 MG 24 hr tablet Take 1 tablet (50 mg total) by mouth daily. Take with or immediately following a meal. 08/30/22   Calvert Cantor, MD  Multiple Vitamin (MULTIVITAMIN) capsule Take 1 capsule by mouth daily. Centrum silver    [provider]  Multiple  Vitamins-Minerals (CENTRUM SILVER PO) Take 1 tablet by mouth daily.    [provider]  Omega-3 Fatty Acids (FISH OIL) 1000 MG CAPS Take 1,000 mg by mouth daily.     [provider]  pilocarpine (PILOCAR) 1 % ophthalmic solution Place 1 drop into both eyes 4 (four) times daily.  08/06/16   [provider]  timolol (TIMOPTIC) 0.5 % ophthalmic solution Place 1 drop into both eyes at bedtime. 03/18/22   [provider]  TUBERCULIN PPD ID Inject 5 Units into the skin. 0.1 ml intradermally every evening    [provider]     Objective    Physical Exam: Vitals:   10/16/22 2055 10/16/22 2100 10/16/22 2215 10/16/22 2230  BP:  130/80 (!) 149/69 130/86  Pulse:  78 76 69  Resp:  14 19 19   Temp:  98.1 F (36.7 C)    TempSrc:  Oral    SpO2: 96% 95% 100% 100%    General: appears to be stated age; alert, oriented Skin: warm, dry, no rash Head:  AT/[place] Mouth:  Oral mucosa membranes appear moist, normal dentition Neck: supple; trachea midline Heart:  RRR; did not appreciate any M/R/G Lungs: CTAB, did not appreciate any wheezes, rales, or rhonchi Abdomen: + BS; soft, ND, NT Vascular: 2+ pedal pulses b/l; 2+ radial pulses b/l Extremities: no peripheral edema, no muscle wasting Neuro: strength and sensation intact in upper and lower extremities b/l ***   *** Neuro: 5/5 strength of the proximal and distal flexors and extensors of the upper and lower extremities bilaterally; sensation intact in upper and lower extremities b/l; cranial nerves II through XII grossly intact; no pronator drift; no evidence suggestive of slurred speech, dysarthria, or facial droop; Normal muscle tone. No tremors.  *** Neuro: In the setting of the patient's current mental status and associated inability to follow instructions, unable to perform full neurologic exam at this time.  As such, assessment of strength, sensation, and cranial nerves is limited at this time. Patient  noted to spontaneously move all 4 extremities. No tremors.  ***    Labs on Admission: I have personally reviewed following labs and imaging studies  CBC: Recent Labs  Lab 10/16/22 2053 10/16/22 2113  WBC 9.7  --   HGB 14.8 15.6*  HCT 48.2* 46.0  MCV 96.8  --   PLT 138*  --    Basic Metabolic Panel: Recent Labs  Lab 10/16/22 2053 10/16/22 2113  NA 139 140  K 4.3 4.2  CL 102 105  CO2 23  --   GLUCOSE 135* 133*  BUN 28* 29*  CREATININE 1.59* 1.60*  CALCIUM 8.6*  --    GFR: CrCl cannot be calculated (Unknown ideal weight.). Liver Function Tests: Recent Labs  Lab 10/16/22 2053  AST 52*  ALT 44  ALKPHOS 84  BILITOT 1.1  PROT 6.4*  ALBUMIN 3.0*   No results for input(s): "LIPASE", "AMYLASE" in the last 168 hours. No results for input(s): "AMMONIA" in the last 168 hours. Coagulation Profile: Recent Labs  Lab 10/16/22 2053  INR 1.4*   Cardiac Enzymes: Recent Labs  Lab 10/16/22 2053  CKTOTAL 58   BNP (last 3 results) No results for input(s): "PROBNP" in the last 8760 hours. HbA1C: No results for input(s): "HGBA1C" in the last 72 hours. CBG: No results for input(s): "GLUCAP" in the last 168 hours. Lipid Profile: No results for input(s): "CHOL", "HDL", "LDLCALC", "TRIG", "CHOLHDL", "LDLDIRECT" in the last 72 hours. Thyroid Function Tests: No results for input(s): "TSH", "T4TOTAL", "FREET4", "T3FREE", "THYROIDAB" in the last 72 hours. Anemia Panel: No results for input(s): "VITAMINB12", "FOLATE", "FERRITIN", "TIBC", "IRON", "RETICCTPCT" in the last 72 hours. Urine analysis:    Component Value Date/Time   COLORURINE YELLOW 10/01/2022 0538   APPEARANCEUR HAZY (A) 10/01/2022 0538   LABSPEC 1.024 10/01/2022 0538   PHURINE 5.0 10/01/2022 0538   GLUCOSEU NEGATIVE 10/01/2022 0538   HGBUR NEGATIVE 10/01/2022 0538   BILIRUBINUR NEGATIVE 10/01/2022 0538   KETONESUR 5 (A) 10/01/2022 0538   PROTEINUR 100 (A) 10/01/2022 0538   UROBILINOGEN 0.2 02/06/2014 2331    NITRITE NEGATIVE 10/01/2022 0538   LEUKOCYTESUR MODERATE (A) 10/01/2022 0538    Radiological Exams on Admission: CT T-SPINE NO CHARGE  Result Date: 10/16/2022 CLINICAL DATA:  Fall with blunt polytrauma. EXAM: CT THORACIC AND LUMBAR SPINE WITHOUT CONTRAST TECHNIQUE: Multidetector CT imaging of the thoracic and lumbar spine was performed without intravenous contrast. Multiplanar CT image reconstructions were also generated. RADIATION DOSE REDUCTION: This exam was performed according to the departmental dose-optimization program which includes automated exposure control, adjustment of the mA and/or kV according to patient size and/or use of iterative reconstruction technique. COMPARISON:  CT abdomen and pelvis and reconstructions dated 10/01/2022, contemporaneous chest CT. FINDINGS: CT THORACIC SPINE FINDINGS Alignment: There is mild thoracic kyphodextroscoliosis, without AP listhesis. Vertebrae: No acute fracture or focal pathologic process is seen. There is osteopenia. There is spondylosis, with multilevel right anterolateral bridging enthesopathy starting at T3 and continuing through T10-11. Paraspinal and other soft tissues: No acute findings. Small right-greater-than-left pleural effusions are unchanged since 10/01/2022. No paraspinal hematoma or reactive change. Disc levels: The thoracic discs are degenerated throughout 2 varying degrees. The greatest disc collapse at T5-6, T6-7, T7-8 and T8-9. No herniated discs or cord compromise are suspected. Dorsal ligamentous hypertrophic ossification on the right impresses mildly on the right dorsolateral thecal sac at T11-12. There are slight facet spurring changes at the lower thoracic levels, but no thoracic spine levels demonstrate significant foraminal encroachment. CT LUMBAR SPINE FINDINGS Segmentation: 5 lumbar type vertebrae. Alignment: Chronic grade 1 L5-S1 anterolisthesis is again noted due to chronic L5 pars defects. Alignment is otherwise within  normal limits. Vertebrae: There is osteopenia. There is preservation of the normal vertebral body heights. No acute lumbar fracture is seen or focal pathologic process. There is moderate marginal osteophytosis, most prominently from L3-4 down. Paraspinal and other soft tissues: Heavy aortoiliac atherosclerosis. No acute findings. Disc levels: The discs are variably degenerated, with vacuum phenomenon noted at L2-3, L4-5 and L5-S1. The greatest disc space loss is at the lowest 2 levels, with relatively mild disc space loss at L1-2, L2-3 and L3-4. There is normal disc height at T12-L1. L1-2: There has a posterior disc osteophyte complex  causing mild spinal canal stenosis without significant lateral recess encroachment. There are right-greater-than-left facet spurs but no foraminal stenosis. L2-3: Severe acquired spinal stenosis again noted due to circumferential disc osteophyte complex, advanced facet hypertrophy, and ligamentous thickening. Both lateral recesses are effaced. There is moderate right and mild left foraminal stenosis. L3-4: There is a calcified central disc extrusion. Again noted severe acquired spinal stenosis due to the disc extrusion, superimposed circumferential disc osteophyte complex, advanced facet hypertrophy, with ligamentous thickening. Both lateral recesses are effaced. There is moderate to severe right and mild left foraminal stenosis. L4-5: Mild-to-moderate facet hypertrophy partially effaces the lateral recesses. There is a mild nonstenosing posterior disc osteophyte complex without herniation. There is moderate right-greater-than-left foraminal stenosis. L5-S1: Chronic L5 pars defects. Grade 1 L5-S1 spondylolisthesis with disc collapse and vacuum phenomenon. No herniated disc. Disc space air again extends into the right lateral recess. There are bilateral facet spurs. There is bilateral severe acquired foraminal stenosis. There is ankylosis across the left SI joint, spurring of both SI  joints are patent right SI joint. No sacral insufficiency fracture is seen. IMPRESSION: 1. Thoracic spine degenerative changes and mild kyphodextroscoliosis without evidence of acute fractures. 2. Small right-greater-than-left pleural effusions, unchanged since 10/01/2022. 3. Lumbar osteopenia and degenerative change without evidence of acute fractures. 4. Chronic L5 pars defects with grade 1 L5-S1 spondylolisthesis. 5. Severe acquired spinal stenosis at L2-3 and L3-4 with bilateral lateral recess effacement, and bilateral foraminal stenosis. 6. Lateral recess effacement at L4-5 with moderate right-greater-than-left foraminal stenosis. 7. Severe bilateral L5-S1 foraminal stenosis. 8. Aortic atherosclerosis. Aortic Atherosclerosis (ICD10-I70.0). Electronically Signed   By: Almira Bar M.D.   On: 10/16/2022 23:18   CT L-SPINE NO CHARGE  Result Date: 10/16/2022 CLINICAL DATA:  Fall with blunt polytrauma. EXAM: CT THORACIC AND LUMBAR SPINE WITHOUT CONTRAST TECHNIQUE: Multidetector CT imaging of the thoracic and lumbar spine was performed without intravenous contrast. Multiplanar CT image reconstructions were also generated. RADIATION DOSE REDUCTION: This exam was performed according to the departmental dose-optimization program which includes automated exposure control, adjustment of the mA and/or kV according to patient size and/or use of iterative reconstruction technique. COMPARISON:  CT abdomen and pelvis and reconstructions dated 10/01/2022, contemporaneous chest CT. FINDINGS: CT THORACIC SPINE FINDINGS Alignment: There is mild thoracic kyphodextroscoliosis, without AP listhesis. Vertebrae: No acute fracture or focal pathologic process is seen. There is osteopenia. There is spondylosis, with multilevel right anterolateral bridging enthesopathy starting at T3 and continuing through T10-11. Paraspinal and other soft tissues: No acute findings. Small right-greater-than-left pleural effusions are unchanged since  10/01/2022. No paraspinal hematoma or reactive change. Disc levels: The thoracic discs are degenerated throughout 2 varying degrees. The greatest disc collapse at T5-6, T6-7, T7-8 and T8-9. No herniated discs or cord compromise are suspected. Dorsal ligamentous hypertrophic ossification on the right impresses mildly on the right dorsolateral thecal sac at T11-12. There are slight facet spurring changes at the lower thoracic levels, but no thoracic spine levels demonstrate significant foraminal encroachment. CT LUMBAR SPINE FINDINGS Segmentation: 5 lumbar type vertebrae. Alignment: Chronic grade 1 L5-S1 anterolisthesis is again noted due to chronic L5 pars defects. Alignment is otherwise within normal limits. Vertebrae: There is osteopenia. There is preservation of the normal vertebral body heights. No acute lumbar fracture is seen or focal pathologic process. There is moderate marginal osteophytosis, most prominently from L3-4 down. Paraspinal and other soft tissues: Heavy aortoiliac atherosclerosis. No acute findings. Disc levels: The discs are variably degenerated, with vacuum phenomenon noted at L2-3,  L4-5 and L5-S1. The greatest disc space loss is at the lowest 2 levels, with relatively mild disc space loss at L1-2, L2-3 and L3-4. There is normal disc height at T12-L1. L1-2: There has a posterior disc osteophyte complex causing mild spinal canal stenosis without significant lateral recess encroachment. There are right-greater-than-left facet spurs but no foraminal stenosis. L2-3: Severe acquired spinal stenosis again noted due to circumferential disc osteophyte complex, advanced facet hypertrophy, and ligamentous thickening. Both lateral recesses are effaced. There is moderate right and mild left foraminal stenosis. L3-4: There is a calcified central disc extrusion. Again noted severe acquired spinal stenosis due to the disc extrusion, superimposed circumferential disc osteophyte complex, advanced facet  hypertrophy, with ligamentous thickening. Both lateral recesses are effaced. There is moderate to severe right and mild left foraminal stenosis. L4-5: Mild-to-moderate facet hypertrophy partially effaces the lateral recesses. There is a mild nonstenosing posterior disc osteophyte complex without herniation. There is moderate right-greater-than-left foraminal stenosis. L5-S1: Chronic L5 pars defects. Grade 1 L5-S1 spondylolisthesis with disc collapse and vacuum phenomenon. No herniated disc. Disc space air again extends into the right lateral recess. There are bilateral facet spurs. There is bilateral severe acquired foraminal stenosis. There is ankylosis across the left SI joint, spurring of both SI joints are patent right SI joint. No sacral insufficiency fracture is seen. IMPRESSION: 1. Thoracic spine degenerative changes and mild kyphodextroscoliosis without evidence of acute fractures. 2. Small right-greater-than-left pleural effusions, unchanged since 10/01/2022. 3. Lumbar osteopenia and degenerative change without evidence of acute fractures. 4. Chronic L5 pars defects with grade 1 L5-S1 spondylolisthesis. 5. Severe acquired spinal stenosis at L2-3 and L3-4 with bilateral lateral recess effacement, and bilateral foraminal stenosis. 6. Lateral recess effacement at L4-5 with moderate right-greater-than-left foraminal stenosis. 7. Severe bilateral L5-S1 foraminal stenosis. 8. Aortic atherosclerosis. Aortic Atherosclerosis (ICD10-I70.0). Electronically Signed   By: Almira Bar M.D.   On: 10/16/2022 23:18   CT MAXILLOFACIAL WO CONTRAST  Result Date: 10/16/2022 CLINICAL DATA:  Level 2 fall with blunt poly fusion trauma on blood thinners. EXAM: CT MAXILLOFACIAL WITHOUT CONTRAST CT CHEST, ABDOMEN AND PELVIS WITH CONTRAST TECHNIQUE: Multidetector CT imaging of the maxillofacial structures was performed. Multiplanar CT image reconstructions were also generated. A small metallic BB was placed on the right temple  in order to reliably differentiate right from left. Multidetector CT imaging of the chest, abdomen and pelvis was performed following the standard protocol during bolus administration of intravenous contrast. RADIATION DOSE REDUCTION: This exam was performed according to the departmental dose-optimization program which includes automated exposure control, adjustment of the mA and/or kV according to patient size and/or use of iterative reconstruction technique. CONTRAST:  60mL OMNIPAQUE IOHEXOL 350 MG/ML SOLN COMPARISON:  CT scan head without contrast 09/30/2021, CT abdomen and pelvis with contrast 10/01/2022, chest CT with contrast 08/19/2000, abdomen and pelvis CT no contrast 02/04/2006. FINDINGS: CT MAXILLOFACIAL FINDINGS Osseous: Chronic slightly depressed fracture, right side of the nasal bone. No evidence of acute facial fracture. No mandibular dislocation is seen. There is asymmetric joint space loss and remodeling of the left TMJ. There is osteopenia. Orbits: Negative. No traumatic or inflammatory finding. Again noted are old lens replacements. Sinuses: There is mild membrane thickening in the paranasal sinuses without fluid levels. There is a broad defect in the cartilaginous nasal septum which may be postsurgical. There is a small defect in the medial wall of the right maxillary sinus alongside the middle nasal meatus which also may be postsurgical such as due to prior meatotomy.  Both of the ostiomeatal complexes are clear. The turbinates are intact. Moderate to severe deviation of the nasal septum to the left is again noted. The mastoid air cells are clear.  The middle ears are clear. Soft tissues: There is laceration in the lateral aspect of the right brow with underlying soft tissue gas. There is a small amount of air in the left external jugular vein and right IJ vein, most likely injected placement of the patient's IV. The epiglottis and tongue base appear normal. Both proximal cervical ICAs are  heavily calcified with likely flow-limiting stenoses. CT CHEST FINDINGS Cardiovascular: Moderate to severe cardiomegaly is again noted with a right chamber predominance indicating likely chronic right heart dysfunction. There are three-vessel coronary artery calcifications. No pericardial effusion. Pulmonary arteries are normal caliber and centrally clear. There are prominent superior pulmonary veins. There is aortic atherosclerosis tortuosity and scattered calcific plaques in the great vessels but no stenosis, dissection or aneurysm. Mediastinum/Nodes: No enlarged mediastinal, hilar, or axillary lymph nodes. Thyroid gland, trachea, and esophagus demonstrate no significant findings. Lungs/Pleura: Again noted are small right-greater-than-left layering pleural effusions, with adjacent atelectasis in the lower lobes, unchanged. Small amount of apical pleural-parenchymal scarring is similar to 2002. There is diffuse bronchial thickening. The lungs demonstrate mild mosaicism most likely due to small airways disease with air trapping. Scattered linear scar-like opacities both bases. Again there appears to be slight interstitial edema in both lung bases but I do not see evidence for airspace edema. This was seen previously.  No suspicious pulmonary nodule. Musculoskeletal: There is osteopenia, degenerative changes of the spine including multilevel bridging enthesopathy, and chronic mild thoracic kyphodextroscoliosis. No spinal compression fracture is seen. The ribcage, shoulder girdles and sternum demonstrate no evidence of an acute fracture. There is a nondisplaced subacute fracture of the anterolateral right fifth rib with evidence of early healing. This is above the plane of the prior abdomen pelvis CT. There are dystrophic calcifications in both breasts consistent with fibrocystic disease but no chest wall hematoma or mass. CT ABDOMEN AND PELVIS FINDINGS Hepatobiliary: Single small stone in the proximal gallbladder. No  wall thickening. No biliary dilatation. Unremarkable liver without obvious laceration or adjacent hematoma. Pancreas: Partially atrophic.  Otherwise unremarkable. Spleen: No abnormality. Adrenals/Urinary Tract: Adrenal glands are unremarkable. Kidneys are normal, without renal calculi, focal lesion, or hydronephrosis. Bladder is unremarkable. Stomach/Bowel: No dilatation or wall thickening. Moderate fecal stasis. Sigmoid diverticulosis without evidence of diverticulitis. Normal caliber appendix. Vascular/Lymphatic: Heavy aortoiliac calcific plaque. Multifocal ostial branch vessel atherosclerosis. No AAA. No adenopathy. Reproductive: Uterus and bilateral adnexa are unremarkable. Other: Trace ascites in the posterior deep pelvis. Mild increased edema extends along the body wall, greater in the dependent areas. There is no free hemorrhage, free air, or focal inflammatory process. Small umbilical fat hernia. Musculoskeletal: Chronic L5 pars defects and grade 1 L5-S1 spondylolisthesis with chronic disc collapse with vacuum phenomenon. Osteopenia and degenerative change remaining lumbar spine. No acute regional skeletal fractures. Ankylosis left SI joint. Severe acquired spinal stenosis at L2-3, L3-4. IMPRESSION: 1. Laceration in the lateral right brow with underlying soft tissue gas. No acute facial or orbital fractures. 2. Osteopenia and degenerative change. 3. Cardiomegaly with right chamber predominance, prominent superior pulmonary veins, small right-greater-than-left layering pleural effusions with probable slight interstitial edema in the lung bases. Similar findings on abdomen and pelvis CT 10/01/2022. 4. Bronchitis with mosaicism in the lungs most likely due to small airways disease with air trapping. 5. Aortic and coronary artery atherosclerosis. 6. Cholelithiasis. 7.  Constipation and diverticulosis. 8. Trace ascites in the posterior deep pelvis. No free air or focal inflammatory process. Increased body wall  edema. 9. Chronic L5 pars defects with grade 1 L5-S1 spondylolisthesis and chronic disc collapse. 10. Severe acquired spinal stenosis at L2-3 and L3-4. 11. Subacute nondisplaced fracture of the anterolateral right fifth rib with evidence of early healing. No acute regional skeletal fractures are seen. 12. Small amount of air in the left EJ vein and right IJ vein, most likely iatrogenic. Aortic Atherosclerosis (ICD10-I70.0). Electronically Signed   By: Almira Bar M.D.   On: 10/16/2022 22:47   CT CHEST ABDOMEN PELVIS W CONTRAST  Result Date: 10/16/2022 CLINICAL DATA:  Level 2 fall with blunt poly fusion trauma on blood thinners. EXAM: CT MAXILLOFACIAL WITHOUT CONTRAST CT CHEST, ABDOMEN AND PELVIS WITH CONTRAST TECHNIQUE: Multidetector CT imaging of the maxillofacial structures was performed. Multiplanar CT image reconstructions were also generated. A small metallic BB was placed on the right temple in order to reliably differentiate right from left. Multidetector CT imaging of the chest, abdomen and pelvis was performed following the standard protocol during bolus administration of intravenous contrast. RADIATION DOSE REDUCTION: This exam was performed according to the departmental dose-optimization program which includes automated exposure control, adjustment of the mA and/or kV according to patient size and/or use of iterative reconstruction technique. CONTRAST:  60mL OMNIPAQUE IOHEXOL 350 MG/ML SOLN COMPARISON:  CT scan head without contrast 09/30/2021, CT abdomen and pelvis with contrast 10/01/2022, chest CT with contrast 08/19/2000, abdomen and pelvis CT no contrast 02/04/2006. FINDINGS: CT MAXILLOFACIAL FINDINGS Osseous: Chronic slightly depressed fracture, right side of the nasal bone. No evidence of acute facial fracture. No mandibular dislocation is seen. There is asymmetric joint space loss and remodeling of the left TMJ. There is osteopenia. Orbits: Negative. No traumatic or inflammatory finding.  Again noted are old lens replacements. Sinuses: There is mild membrane thickening in the paranasal sinuses without fluid levels. There is a broad defect in the cartilaginous nasal septum which may be postsurgical. There is a small defect in the medial wall of the right maxillary sinus alongside the middle nasal meatus which also may be postsurgical such as due to prior meatotomy. Both of the ostiomeatal complexes are clear. The turbinates are intact. Moderate to severe deviation of the nasal septum to the left is again noted. The mastoid air cells are clear.  The middle ears are clear. Soft tissues: There is laceration in the lateral aspect of the right brow with underlying soft tissue gas. There is a small amount of air in the left external jugular vein and right IJ vein, most likely injected placement of the patient's IV. The epiglottis and tongue base appear normal. Both proximal cervical ICAs are heavily calcified with likely flow-limiting stenoses. CT CHEST FINDINGS Cardiovascular: Moderate to severe cardiomegaly is again noted with a right chamber predominance indicating likely chronic right heart dysfunction. There are three-vessel coronary artery calcifications. No pericardial effusion. Pulmonary arteries are normal caliber and centrally clear. There are prominent superior pulmonary veins. There is aortic atherosclerosis tortuosity and scattered calcific plaques in the great vessels but no stenosis, dissection or aneurysm. Mediastinum/Nodes: No enlarged mediastinal, hilar, or axillary lymph nodes. Thyroid gland, trachea, and esophagus demonstrate no significant findings. Lungs/Pleura: Again noted are small right-greater-than-left layering pleural effusions, with adjacent atelectasis in the lower lobes, unchanged. Small amount of apical pleural-parenchymal scarring is similar to 2002. There is diffuse bronchial thickening. The lungs demonstrate mild mosaicism most likely due to small airways  disease with air  trapping. Scattered linear scar-like opacities both bases. Again there appears to be slight interstitial edema in both lung bases but I do not see evidence for airspace edema. This was seen previously.  No suspicious pulmonary nodule. Musculoskeletal: There is osteopenia, degenerative changes of the spine including multilevel bridging enthesopathy, and chronic mild thoracic kyphodextroscoliosis. No spinal compression fracture is seen. The ribcage, shoulder girdles and sternum demonstrate no evidence of an acute fracture. There is a nondisplaced subacute fracture of the anterolateral right fifth rib with evidence of early healing. This is above the plane of the prior abdomen pelvis CT. There are dystrophic calcifications in both breasts consistent with fibrocystic disease but no chest wall hematoma or mass. CT ABDOMEN AND PELVIS FINDINGS Hepatobiliary: Single small stone in the proximal gallbladder. No wall thickening. No biliary dilatation. Unremarkable liver without obvious laceration or adjacent hematoma. Pancreas: Partially atrophic.  Otherwise unremarkable. Spleen: No abnormality. Adrenals/Urinary Tract: Adrenal glands are unremarkable. Kidneys are normal, without renal calculi, focal lesion, or hydronephrosis. Bladder is unremarkable. Stomach/Bowel: No dilatation or wall thickening. Moderate fecal stasis. Sigmoid diverticulosis without evidence of diverticulitis. Normal caliber appendix. Vascular/Lymphatic: Heavy aortoiliac calcific plaque. Multifocal ostial branch vessel atherosclerosis. No AAA. No adenopathy. Reproductive: Uterus and bilateral adnexa are unremarkable. Other: Trace ascites in the posterior deep pelvis. Mild increased edema extends along the body wall, greater in the dependent areas. There is no free hemorrhage, free air, or focal inflammatory process. Small umbilical fat hernia. Musculoskeletal: Chronic L5 pars defects and grade 1 L5-S1 spondylolisthesis with chronic disc collapse with vacuum  phenomenon. Osteopenia and degenerative change remaining lumbar spine. No acute regional skeletal fractures. Ankylosis left SI joint. Severe acquired spinal stenosis at L2-3, L3-4. IMPRESSION: 1. Laceration in the lateral right brow with underlying soft tissue gas. No acute facial or orbital fractures. 2. Osteopenia and degenerative change. 3. Cardiomegaly with right chamber predominance, prominent superior pulmonary veins, small right-greater-than-left layering pleural effusions with probable slight interstitial edema in the lung bases. Similar findings on abdomen and pelvis CT 10/01/2022. 4. Bronchitis with mosaicism in the lungs most likely due to small airways disease with air trapping. 5. Aortic and coronary artery atherosclerosis. 6. Cholelithiasis. 7. Constipation and diverticulosis. 8. Trace ascites in the posterior deep pelvis. No free air or focal inflammatory process. Increased body wall edema. 9. Chronic L5 pars defects with grade 1 L5-S1 spondylolisthesis and chronic disc collapse. 10. Severe acquired spinal stenosis at L2-3 and L3-4. 11. Subacute nondisplaced fracture of the anterolateral right fifth rib with evidence of early healing. No acute regional skeletal fractures are seen. 12. Small amount of air in the left EJ vein and right IJ vein, most likely iatrogenic. Aortic Atherosclerosis (ICD10-I70.0). Electronically Signed   By: Almira Bar M.D.   On: 10/16/2022 22:47   CT CERVICAL SPINE WO CONTRAST  Result Date: 10/16/2022 CLINICAL DATA:  Polytrauma, blunt.  Fall EXAM: CT CERVICAL SPINE WITHOUT CONTRAST TECHNIQUE: Multidetector CT imaging of the cervical spine was performed without intravenous contrast. Multiplanar CT image reconstructions were also generated. RADIATION DOSE REDUCTION: This exam was performed according to the departmental dose-optimization program which includes automated exposure control, adjustment of the mA and/or kV according to patient size and/or use of iterative  reconstruction technique. COMPARISON:  None Available. FINDINGS: Alignment: Normal Skull base and vertebrae: No acute fracture. No primary bone lesion or focal pathologic process. Soft tissues and spinal canal: No prevertebral fluid or swelling. No visible canal hematoma. Disc levels: Diffuse degenerative disc disease, most  pronounced in the lower cervical spine. Diffuse advanced degenerative facet disease. Upper chest: Biapical scarring.  No acute findings. Other: None IMPRESSION: Degenerative disc and facet disease.  No acute bony abnormality. Electronically Signed   By: Charlett Nose M.D.   On: 10/16/2022 22:10   CT HEAD WO CONTRAST  Result Date: 10/16/2022 CLINICAL DATA:  Head trauma, moderate-severe. Fall. On blood thinners. EXAM: CT HEAD WITHOUT CONTRAST TECHNIQUE: Contiguous axial images were obtained from the base of the skull through the vertex without intravenous contrast. RADIATION DOSE REDUCTION: This exam was performed according to the departmental dose-optimization program which includes automated exposure control, adjustment of the mA and/or kV according to patient size and/or use of iterative reconstruction technique. 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 There is atrophy and chronic small vessel disease changes. No acute intracranial abnormality. Specifically, no hemorrhage, hydrocephalus, mass lesion, acute infarction, or significant intracranial injury. Vascular: No hyperdense vessel or unexpected calcification. Skull: No acute calvarial abnormality. Sinuses/Orbits: No acute findings Other: None IMPRESSION: Old right parietal infarct. Atrophy, chronic microvascular disease. No acute intracranial abnormality. Electronically Signed   By: Charlett Nose M.D.   On: 10/16/2022 22:07   DG Pelvis Portable  Result Date: 10/16/2022 CLINICAL DATA:  Trauma level 2 fall EXAM: PORTABLE PELVIS 1-2 VIEWS COMPARISON:  10/01/2022 FINDINGS: SI joints show  mild degenerative change but no widening. Pubic symphysis and rami appear intact. No fracture or malalignment. Moderate bilateral hip degenerative changes IMPRESSION: No acute osseous abnormality. Electronically Signed   By: Jasmine Pang M.D.   On: 10/16/2022 21:53   DG Chest Portable 1 View  Result Date: 10/16/2022 CLINICAL DATA:  Trauma level 2 fall EXAM: PORTABLE CHEST 1 VIEW COMPARISON:  10/01/2022, CT 08/19/2000 FINDINGS: Cardiomegaly with mild diffuse interstitial opacities possible edema. Possible left effusion. Aortic atherosclerosis. No convincing pneumothorax IMPRESSION: Cardiomegaly with mild interstitial edema and possible small left effusion. Electronically Signed   By: Jasmine Pang M.D.   On: 10/16/2022 21:49      Assessment/Plan   Principal Problem:   Acute on chronic systolic heart failure (HCC)   ***            ***                ***                 ***               ***               ***               ***                ***               ***               ***               ***               ***              ***          ***  DVT prophylaxis: SCD's ***  Code Status: Full code*** Family Communication: none*** Disposition Plan: Per Rounding Team Consults called: none***;  Admission status: ***     I SPENT GREATER THAN 75 *** MINUTES IN CLINICAL CARE TIME/MEDICAL DECISION-MAKING IN COMPLETING THIS ADMISSION.  Chaney Born Raychelle Hudman DO [facility]  From 7PM - 7AM   10/16/2022, 11:38 PM   ***

## 2022-10-16 NOTE — Progress Notes (Signed)
   10/16/22 2125  Spiritual Encounters  Type of Visit Initial  Care provided to: Pt not available  Conversation partners present during encounter Nurse;Physician;Other (comment)  Referral source Code page  Reason for visit Trauma   Responded to Level 2 trauma - fall on thinners. 89 year old female with head bleed and scraped skin/body bruise states she fell out of bed, (nursing home reports they are unsure what happed as they found the patient on the floor.) Patient alert. No family present. X-rays taken. Patient taken for CT scan. Per medical team - concern as patient has bruised head bleed and is on thinners - Patient has laceration above right eye  - must check for possible internal injury as well. If discharged patient will return to nursing home facility.

## 2022-10-16 NOTE — Progress Notes (Signed)
Orthopedic Tech Progress Note Patient Details:  Amanda Cochran [birth year] [id number] Level 2 Trauma  Patient ID: [name], female   DOB: Nov 24, 1928, 89 y.o.   MRN: [id number]  Smitty Pluck 10/16/2022, 9:15 PM

## 2022-10-17 DIAGNOSIS — R296 Repeated falls: Secondary | ICD-10-CM | POA: Insufficient documentation

## 2022-10-17 DIAGNOSIS — E039 Hypothyroidism, unspecified: Secondary | ICD-10-CM

## 2022-10-17 DIAGNOSIS — I5023 Acute on chronic systolic (congestive) heart failure: Secondary | ICD-10-CM

## 2022-10-17 DIAGNOSIS — I480 Paroxysmal atrial fibrillation: Secondary | ICD-10-CM | POA: Diagnosis not present

## 2022-10-17 DIAGNOSIS — N179 Acute kidney failure, unspecified: Secondary | ICD-10-CM | POA: Diagnosis not present

## 2022-10-17 DIAGNOSIS — J9601 Acute respiratory failure with hypoxia: Secondary | ICD-10-CM | POA: Insufficient documentation

## 2022-10-17 DIAGNOSIS — F411 Generalized anxiety disorder: Secondary | ICD-10-CM | POA: Diagnosis present

## 2022-10-17 DIAGNOSIS — I10 Essential (primary) hypertension: Secondary | ICD-10-CM

## 2022-10-17 LAB — COMPREHENSIVE METABOLIC PANEL
ALT: 40 U/L (ref 0–44)
AST: 38 U/L (ref 15–41)
Albumin: 2.9 g/dL — ABNORMAL LOW (ref 3.5–5.0)
Alkaline Phosphatase: 72 U/L (ref 38–126)
Anion gap: 12 (ref 5–15)
BUN: 24 mg/dL — ABNORMAL HIGH (ref 8–23)
CO2: 24 mmol/L (ref 22–32)
Calcium: 8.2 mg/dL — ABNORMAL LOW (ref 8.9–10.3)
Chloride: 102 mmol/L (ref 98–111)
Creatinine, Ser: 1.54 mg/dL — ABNORMAL HIGH (ref 0.44–1.00)
GFR, Estimated: 31 mL/min — ABNORMAL LOW (ref >60)
Glucose, Bld: 121 mg/dL — ABNORMAL HIGH (ref 70–99)
Potassium: 3.6 mmol/L (ref 3.5–5.1)
Sodium: 138 mmol/L (ref 135–145)
Total Bilirubin: 1 mg/dL (ref 0.3–1.2)
Total Protein: 5.8 g/dL — ABNORMAL LOW (ref 6.5–8.1)

## 2022-10-17 LAB — CBC WITH DIFFERENTIAL/PLATELET
Abs Immature Granulocytes: 0.05 10*3/uL (ref 0.00–0.07)
Basophils Absolute: 0.1 10*3/uL (ref 0.0–0.1)
Basophils Relative: 1 %
Eosinophils Absolute: 0.1 10*3/uL (ref 0.0–0.5)
Eosinophils Relative: 1 %
HCT: 45.3 % (ref 36.0–46.0)
Hemoglobin: 14.6 g/dL (ref 12.0–15.0)
Immature Granulocytes: 1 %
Lymphocytes Relative: 20 %
Lymphs Abs: 1.7 10*3/uL (ref 0.7–4.0)
MCH: 30.4 pg (ref 26.0–34.0)
MCHC: 32.2 g/dL (ref 30.0–36.0)
MCV: 94.4 fL (ref 80.0–100.0)
Monocytes Absolute: 0.5 10*3/uL (ref 0.1–1.0)
Monocytes Relative: 6 %
Neutro Abs: 6.2 10*3/uL (ref 1.7–7.7)
Neutrophils Relative %: 71 %
Platelets: 137 10*3/uL — ABNORMAL LOW (ref 150–400)
RBC: 4.8 MIL/uL (ref 3.87–5.11)
RDW: 20.1 % — ABNORMAL HIGH (ref 11.5–15.5)
WBC: 8.6 10*3/uL (ref 4.0–10.5)
nRBC: 0 % (ref 0.0–0.2)

## 2022-10-17 LAB — MAGNESIUM
Magnesium: 1.9 mg/dL (ref 1.7–2.4)
Magnesium: 1.8 mg/dL (ref 1.7–2.4)

## 2022-10-17 LAB — BRAIN NATRIURETIC PEPTIDE: B Natriuretic Peptide: 3904.1 pg/mL — ABNORMAL HIGH (ref 0.0–100.0)

## 2022-10-17 LAB — PHOSPHORUS: Phosphorus: 3 mg/dL (ref 2.5–4.6)

## 2022-10-17 MED ORDER — APIXABAN 2.5 MG PO TABS
2.5000 mg | ORAL_TABLET | Freq: Two times a day (BID) | ORAL | Status: DC
  Administered 2022-10-17: 2.5 mg via ORAL
  Filled 2022-10-17: qty 1

## 2022-10-17 MED ORDER — FUROSEMIDE 10 MG/ML IJ SOLN
20.0000 mg | Freq: Two times a day (BID) | INTRAMUSCULAR | Status: DC
  Administered 2022-10-17 (×2): 20 mg via INTRAVENOUS
  Filled 2022-10-17 (×2): qty 2

## 2022-10-17 MED ORDER — BUSPIRONE HCL 5 MG PO TABS
5.0000 mg | ORAL_TABLET | Freq: Two times a day (BID) | ORAL | Status: DC
  Administered 2022-10-17 – 2022-10-19 (×5): 5 mg via ORAL
  Filled 2022-10-17 (×5): qty 1

## 2022-10-17 MED ORDER — ESCITALOPRAM OXALATE 10 MG PO TABS
5.0000 mg | ORAL_TABLET | Freq: Every day | ORAL | Status: DC
  Administered 2022-10-17 – 2022-10-19 (×3): 5 mg via ORAL
  Filled 2022-10-17 (×3): qty 1

## 2022-10-17 MED ORDER — METOPROLOL SUCCINATE ER 25 MG PO TB24
25.0000 mg | ORAL_TABLET | Freq: Every day | ORAL | Status: DC
  Administered 2022-10-17 – 2022-10-19 (×3): 25 mg via ORAL
  Filled 2022-10-17 (×3): qty 1

## 2022-10-17 MED ORDER — FUROSEMIDE 20 MG PO TABS
20.0000 mg | ORAL_TABLET | Freq: Every day | ORAL | Status: DC
  Administered 2022-10-18 – 2022-10-19 (×2): 20 mg via ORAL
  Filled 2022-10-17 (×2): qty 1

## 2022-10-17 MED ORDER — LEVOTHYROXINE SODIUM 100 MCG PO TABS
100.0000 ug | ORAL_TABLET | Freq: Every day | ORAL | Status: DC
  Administered 2022-10-17 – 2022-10-19 (×3): 100 ug via ORAL
  Filled 2022-10-17 (×3): qty 1

## 2022-10-17 NOTE — Assessment & Plan Note (Signed)
Continue rate control with metoprolol. Considering ambulatory dysfunction and frequent falls, will hold on anticoagulation. Discussed with patient's nice.

## 2022-10-17 NOTE — Assessment & Plan Note (Signed)
Continue blood pressure control with metoprolol.

## 2022-10-17 NOTE — Evaluation (Signed)
Occupational Therapy Evaluation Patient Details Name: Amanda Cochran MRN: [id number] DOB: 05-06-1928 Today's Date: 10/17/2022   History of Present Illness Pt is a 89 y.o. female admitted 8/23 following a fall. She struck R side of head. CT negative. Admitted with dx of acute on chronic systolic heart failure, AKI, and hypoxia. PMH: depression, h/o CVA (L BG infarct with R hemi), HLD, HTN, a fib, heart failure   Clinical Impression   Pt admitted from SNF for rehab when she fell. Prior to that she lived at home alone and Independent for ADLs.  Patient required mod assist supine to sit, min  A sit to stand and max A for LB ADLs.  Patient would benefit from additional OT intervention to address functional deficits of activity tolerance, safety, fall prevention, strength and independence in ADLs and functional mobility.  Patient would benefit from SNF placement to address functional deficits listed above in order for patient to return to PLOF      If plan is discharge home, recommend the following: Assistance with cooking/housework;Assist for transportation;Help with stairs or ramp for entrance;Direct supervision/assist for financial management;Direct supervision/assist for medications management;A lot of help with bathing/dressing/bathroom    Functional Status Assessment  Patient has had a recent decline in their functional status and demonstrates the ability to make significant improvements in function in a reasonable and predictable amount of time.  Equipment Recommendations  Other (comment) (defer to next level of care)    Recommendations for Other Services       Precautions / Restrictions Precautions Precautions: Fall Restrictions Weight Bearing Restrictions: No      Mobility Bed Mobility Overal bed mobility: Needs Assistance Bed Mobility: Supine to Sit, Sit to Supine     Supine to sit: Mod assist, HOB elevated Sit to supine: Mod assist   General bed mobility comments: increased  time, cues for sequencing, assist with BLE and trunk    Transfers Overall transfer level: Needs assistance Equipment used: Rolling walker (2 wheels) Transfers: Sit to/from Stand Sit to Stand: Min assist                  Balance Overall balance assessment: Needs assistance Sitting-balance support: Feet supported, Bilateral upper extremity supported                                       ADL either performed or assessed with clinical judgement   ADL Overall ADL's : Needs assistance/impaired Eating/Feeding: Minimal assistance   Grooming: Wash/dry face;Wash/dry hands;Sitting;Moderate assistance   Upper Body Bathing: Maximal assistance;Sitting   Lower Body Bathing: Maximal assistance;Sitting/lateral leans   Upper Body Dressing : Maximal assistance   Lower Body Dressing: Total assistance   Toilet Transfer: Minimal assistance   Toileting- Clothing Manipulation and Hygiene: Maximal assistance               Vision         Perception         Praxis         Pertinent Vitals/Pain Pain Assessment Pain Assessment: 0-10 Pain Score: 2  Pain Location: generalized Pain Descriptors / Indicators: Moaning Pain Intervention(s): Monitored during session     Extremity/Trunk Assessment Upper Extremity Assessment Upper Extremity Assessment: Generalized weakness   Lower Extremity Assessment Lower Extremity Assessment: Defer to PT evaluation   Cervical / Trunk Assessment Cervical / Trunk Assessment: Kyphotic   Communication Communication Communication: Difficulty following commands/understanding;Hearing  impairment Following commands: Follows one step commands inconsistently Cueing Techniques: Verbal cues;Gestural cues;Tactile cues   Cognition Arousal: Alert Behavior During Therapy: Anxious, Impulsive Overall Cognitive Status: No family/caregiver present to determine baseline cognitive functioning                                  General Comments: Very confused, asking repeatedly why she can't die     General Comments  VSS on 2L. Pt perseverating on need to pee. Purewick and brief in place in bed. Once standing, pt able to urinate and became more relaxed.    Exercises     Shoulder Instructions      Home Living Family/patient expects to be discharged to:: Private residence Living Arrangements: Alone Available Help at Discharge: Friend(s);Available PRN/intermittently Type of Home: House Home Access: Stairs to enter [employer] of Steps: 3 Entrance Stairs-Rails: Left;Right;Can reach both Home Layout: Two level Alternate Level Stairs-Number of Steps: 6 Alternate Level Stairs-Rails: Left Bathroom Shower/Tub: Tub/shower unit   Bathroom Toilet: Handicapped height     Home Equipment: Agricultural consultant (2 wheels);Shower seat;BSC/3in1;Cane - single point;Wheelchair - manual   Additional Comments: Hospitalization at [facility] July 2024 with d/c to SNF. Home set up/PLOF provide is prior to that admission.      Prior Functioning/Environment Prior Level of Function : Independent/Modified Independent;Patient poor historian/Family not available             Mobility Comments: SPC ADLs Comments: Assist with paying bills, groceries, mowing        OT Problem List: Decreased strength;Decreased activity tolerance;Decreased safety awareness      OT Treatment/Interventions: Self-care/ADL training;Therapeutic exercise;Neuromuscular education;Therapeutic activities;Patient/family education    OT Goals(Current goals can be found in the care plan section) Acute Rehab OT Goals OT Goal Formulation: With patient Time For Goal Achievement: 10/31/22 Potential to Achieve Goals: Good  OT Frequency: Min 1X/week    Co-evaluation              AM-PAC OT "6 Clicks" Daily Activity     Outcome Measure Help from another person eating meals?: A Little Help from another person taking care of personal grooming?: A  Little Help from another person toileting, which includes using toliet, bedpan, or urinal?: A Lot Help from another person bathing (including washing, rinsing, drying)?: A Lot Help from another person to put on and taking off regular upper body clothing?: A Lot Help from another person to put on and taking off regular lower body clothing?: A Lot 6 Click Score: 14   End of Session Equipment Utilized During Treatment: Gait belt;Rolling walker (2 wheels) Nurse Communication: Mobility status  Activity Tolerance: Patient tolerated treatment well Patient left: in bed;with call bell/phone within reach  OT Visit Diagnosis: Unsteadiness on feet (R26.81);Muscle weakness (generalized) (M62.81)                Time: 1610-9604 OT Time Calculation (min): 23 min Charges:  OT General Charges $OT Visit: 1 Visit OT Treatments $Self Care/Home Management : 8-22 mins  Governor Specking OT/L  Denice Paradise 10/17/2022, 2:05 PM

## 2022-10-17 NOTE — Evaluation (Signed)
Physical Therapy Evaluation Patient Details Name: Amanda Cochran MRN: [id number] DOB: 1928/09/06 Today's Date: 10/17/2022  History of Present Illness  Pt is a 89 y.o. female admitted 8/23 following a fall. She struck R side of head. CT negative. Admitted with dx of acute on chronic systolic heart failure, AKI, and hypoxia. PMH: depression, h/o CVA (L BG infarct with R hemi), HLD, HTN, a fib, heart failure   Clinical Impression  Pt admitted with above diagnosis. She was at [facility] for rehab when she fell. Prior to that she lived at home alone, mod I. Pt currently with functional limitations due to the deficits listed below (see PT Problem List). On eval, she required mod assist bed mobility, min assist sit to stand, and min assist sidestepping with RW 3'. She demo deficits in strength, balance, cognition, and activity tolerance. Pt will benefit from acute skilled PT to increase their independence and safety with mobility to allow discharge.  Upon d/c, pt would benefit from return to SNF for further therapy.          If plan is discharge home, recommend the following: Assist for transportation;A little help with walking and/or transfers;A little help with bathing/dressing/bathroom;Direct supervision/assist for financial management;Help with stairs or ramp for entrance;Direct supervision/assist for medications management;Assistance with cooking/housework;Supervision due to cognitive status   Can travel by private vehicle   No    Equipment Recommendations Other (comment) (defer to next venue)  Recommendations for Other Services       Functional Status Assessment Patient has had a recent decline in their functional status and demonstrates the ability to make significant improvements in function in a reasonable and predictable amount of time.     Precautions / Restrictions Precautions Precautions: Fall      Mobility  Bed Mobility Overal bed mobility: Needs Assistance Bed Mobility: Supine to  Sit, Sit to Supine     Supine to sit: Mod assist, HOB elevated Sit to supine: Mod assist   General bed mobility comments: increased time, cues for sequencing, assist with BLE and trunk    Transfers Overall transfer level: Needs assistance Equipment used: Rolling walker (2 wheels) Transfers: Sit to/from Stand Sit to Stand: Min assist           General transfer comment: cues for hand placement and sequencing, assist to power up. Pt pulling up on RW.    Ambulation/Gait Ambulation/Gait assistance: Min Chemical engineer (Feet): 3 Feet Assistive device: Rolling walker (2 wheels)         General Gait Details: sidestepping bedside  Stairs            Wheelchair Mobility     Tilt Bed    Modified Rankin (Stroke Patients Only)       Balance Overall balance assessment: Needs assistance Sitting-balance support: Feet supported, Bilateral upper extremity supported Sitting balance-Leahy Scale: Fair     Standing balance support: Bilateral upper extremity supported, During functional activity, Reliant on assistive device for balance Standing balance-Leahy Scale: Poor                               Pertinent Vitals/Pain Pain Assessment Pain Assessment: Faces Faces Pain Scale: Hurts a little bit Pain Location: generalized Pain Descriptors / Indicators: Moaning Pain Intervention(s): Monitored during session, Repositioned, Limited activity within patient's tolerance    Home Living Family/patient expects to be discharged to:: Private residence Living Arrangements: Alone Available Help at Discharge: Friend(s);Available PRN/intermittently Type  of Home: House Home Access: Stairs to enter Entrance Stairs-Rails: Left;Right;Can reach both Entrance Stairs-Number of Steps: 3 Alternate Level Stairs-Number of Steps: 6 Home Layout: Two level Home Equipment: Agricultural consultant (2 wheels);Shower seat;BSC/3in1;Cane - single point;Wheelchair - manual Additional  Comments: Hospitalization at [facility] July 2024 with d/c to SNF. Home set up/PLOF provide is prior to that admission.    Prior Function Prior Level of Function : Independent/Modified Independent;Patient poor historian/Family not available             Mobility Comments: SPC ADLs Comments: Assist with paying bills, groceries, mowing     Extremity/Trunk Assessment   Upper Extremity Assessment Upper Extremity Assessment: Defer to OT evaluation    Lower Extremity Assessment Lower Extremity Assessment: Generalized weakness    Cervical / Trunk Assessment Cervical / Trunk Assessment: Kyphotic  Communication   Communication Communication: Difficulty following commands/understanding;Hearing impairment Following commands: Follows one step commands inconsistently Cueing Techniques: Verbal cues;Tactile cues  Cognition Arousal: Alert Behavior During Therapy: Anxious, Impulsive Overall Cognitive Status: No family/caregiver present to determine baseline cognitive functioning                                 General Comments: Very confused. Continually calling out for help. Following simple commands inconsisitently. Easily distracted. Cues to stay on task.        General Comments General comments (skin integrity, edema, etc.): VSS on 2L. Pt perseverating on need to pee. Purewick and brief in place in bed. Once standing, pt able to urinate and became more relaxed.    Exercises     Assessment/Plan    PT Assessment Patient needs continued PT services  PT Problem List Decreased strength;Decreased balance;Decreased cognition;Decreased knowledge of precautions;Decreased mobility;Decreased activity tolerance;Decreased safety awareness       PT Treatment Interventions DME instruction;Functional mobility training;Balance training;Patient/family education;Gait training;Therapeutic activities;Therapeutic exercise    PT Goals (Current goals can be found in the Care Plan section)   Acute Rehab PT Goals Patient Stated Goal: not stated PT Goal Formulation: Patient unable to participate in goal setting Time For Goal Achievement: 10/31/22 Potential to Achieve Goals: Fair    Frequency Min 1X/week     Co-evaluation               AM-PAC PT "6 Clicks" Mobility  Outcome Measure Help needed turning from your back to your side while in a flat bed without using bedrails?: A Little Help needed moving from lying on your back to sitting on the side of a flat bed without using bedrails?: A Lot Help needed moving to and from a bed to a chair (including a wheelchair)?: A Lot Help needed standing up from a chair using your arms (e.g., wheelchair or bedside chair)?: A Little Help needed to walk in hospital room?: A Lot Help needed climbing 3-5 steps with a railing? : Total 6 Click Score: 13    End of Session Equipment Utilized During Treatment: Gait belt;Oxygen Activity Tolerance: Patient limited by fatigue Patient left: in bed;with call bell/phone within reach;with bed alarm set Nurse Communication: Mobility status PT Visit Diagnosis: Other abnormalities of gait and mobility (R26.89);Muscle weakness (generalized) (M62.81)    Time: 1610-9604 PT Time Calculation (min) (ACUTE ONLY): 17 min   Charges:   PT Evaluation $PT Eval Moderate Complexity: 1 Mod   PT General Charges $$ ACUTE PT VISIT: 1 Visit         Ferd Glassing., PT  Office # [phone]   Ilda Foil 10/17/2022, 11:43 AM

## 2022-10-17 NOTE — Hospital Course (Addendum)
Mrs. Tenaglia was admitted to the hospital with the working diagnosis of heart failure exacerbation.   89 yo female with the past medical history of paroxysmal atrial fibrillation, heart failure and hypertension, transferred from SNF to the ED due to mechanical fall. Patient sustained a mechanical fall from her own hight while ambulating. Apparently she tripped and fell, producing a right sided head trauma. No syncope. On her initial physical examination her blood pressure was 130/80, HR 76, RR 14 and 02 saturation 96%, lungs with no wheezing or rales, heart with S1 and S2 present irregularly irregular, abdomen with no distention and no lower extremity edema.   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 No acute changes.   Chest radiograph with cardiomegaly, bilateral hilar vascular congestion, fluid in the right fissure, no effusions.   EKG 74 bpm, right axis, normal intervals, atrial fibrillation rhythm, with PVC, no significant ST segment changes, negative T wave V4 to V6.   Her volume has improved.  Echocardiogram with reduced LV systolic function.   08/24 transitioned to oral furosemide. Plan to stop apixaban due to fall and bleeding risk. 08/25 volume status has improved, plan to transfer to SNF in am.  08/26 transfer to SNF today.

## 2022-10-17 NOTE — Assessment & Plan Note (Addendum)
Volume status has improved.   Renal function with serum cr at 1,28, with K at 3,4 and serum bicarbonate at 25.   Plan to continue diuresis with oral furosemide.  Kcl 40 meq today.  Follow up renal function and electrolytes in am.

## 2022-10-17 NOTE — Assessment & Plan Note (Signed)
Continue with buspirone, and escitalopram. Positive cognitive impairment and high risk for delirium.

## 2022-10-17 NOTE — Progress Notes (Signed)
  Progress Note   Patient: Amanda Cochran IRS:[id number] DOB: [birth year] DOA: 10/16/2022     1 DOS: the patient was seen and examined on 10/17/2022   Brief hospital course: Amanda Cochran was admitted to the hospital with the working diagnosis of heart failure exacerbation.   89 yo female with the past medical history of paroxysmal atrial fibrillation, heart failure and hypertension, transferred from SNF to the ED due to mechanical fall. Patient sustained a mechanical fall from her own hight while ambulating. Apparently she tripped and fell, producing a right sided head trauma. No syncope. On her initial physical examination her blood pressure was 130/80, HR 76, RR 14 and 02 saturation 96%, lungs with no wheezing or rales, heart with S1 and S2 present irregularly irregular, abdomen with no distention and no lower extremity edema.   Head CT with old right parietal infarct. No acute changes.   Chest radiograph with cardiomegaly, bilateral hilar vascular congestion, fluid in the right fissure, no effusions.   EKG 74 bpm, right axis, normal intervals, atrial fibrillation rhythm, with PVC, no significant ST segment changes, negative T wave V4 to V6.   Assessment and Plan: * Acute on chronic systolic heart failure (HCC) Echocardiogram with reduced LV systolic function EF 30 to 35%, LV cavity with mild dilatation, RV systolic function with moderate reduction, RVSP 39.2 mnmHg. LA and RA with severe dilatation. Moderate MR.   Urine output 800 cc Systolic blood pressure 122 to 137 mmHg.   Volume status has improved. Plan to change to oral furosemide to continue diuresis. Limited pharmacologic therapy due to cognitive impairment and poor mobility.    Paroxysmal atrial fibrillation (HCC) Continue rate control with metoprolol. Considering ambulatory dysfunction and frequent falls, will hold on anticoagulation for now.   AKI (acute kidney injury) (HCC) Renal function with serum cr at 1,54 with K at 3,6 and  serum bicarbonate at 24. Na 138, Mg 1.8   Plan to continue diuresis with oral furosemide.  Follow up renal function in am.   Essential hypertension Continue blood pressure control with metoprolol.   Acquired hypothyroidism Continue with levothyroxine.   GAD (generalized anxiety disorder) Continue with buspirone, and escitalopram. Positive cognitive impairment and high risk for delirium.         Subjective: Patient confused and disorientated. Decreased hearing and decreased vision, no headache or dyspnea.   Physical Exam: Vitals:   10/17/22 0655 10/17/22 0744 10/17/22 0903 10/17/22 1107  BP:  137/78 124/69 122/82  Pulse:  79  67  Resp:  17  (!) 22  Temp:  (!) 97 F (36.1 C) 97.6 F (36.4 C) 98.1 F (36.7 C)  TempSrc:  Oral Axillary Oral  SpO2: 97% 99%  100%  Weight:       Neurology awake and alert. Confused and disorientated. Able to follow simple commands. Deconditioned and ill looking appearing. Positive laceration on her right superior orbital region.  ENT with mild pallor Cardiovascular with S1 and S2 present, irregularly irregular with no gallops or rubs Respiratory with no rales or wheezing Abdomen with no distention  Data Reviewed:    Family Communication: no family at the bedside   Disposition: Status is: Inpatient Remains inpatient appropriate because: heart failure   Planned Discharge Destination: Skilled nursing facility     Author: Coralie Keens, MD 10/17/2022 3:09 PM  For on call review [email or web address].

## 2022-10-17 NOTE — Plan of Care (Signed)
  Problem: Clinical Measurements: Goal: Will remain free from infection Outcome: Progressing Goal: Respiratory complications will improve Outcome: Progressing   Problem: Activity: Goal: Risk for activity intolerance will decrease Outcome: Progressing   Problem: Nutrition: Goal: Adequate nutrition will be maintained Outcome: Progressing   Problem: Coping: Goal: Level of anxiety will decrease Outcome: Progressing   Problem: Elimination: Goal: Will not experience complications related to bowel motility Outcome: Progressing Goal: Will not experience complications related to urinary retention Outcome: Progressing   Problem: Pain Managment: Goal: General experience of comfort will improve Outcome: Progressing   Problem: Safety: Goal: Ability to remain free from injury will improve Outcome: Progressing   Problem: Skin Integrity: Goal: Risk for impaired skin integrity will decrease Outcome: Progressing

## 2022-10-17 NOTE — Assessment & Plan Note (Signed)
Echocardiogram with reduced LV systolic function EF 30 to 35%, LV cavity with mild dilatation, RV systolic function with moderate reduction, RVSP 39.2 mnmHg. LA and RA with severe dilatation. Moderate MR.   Urine output 800 cc Systolic blood pressure 122 to 137 mmHg.   Volume status has improved. Plan to change to oral furosemide to continue diuresis. Limited pharmacologic therapy due to cognitive impairment and poor mobility.

## 2022-10-17 NOTE — Assessment & Plan Note (Signed)
Continue with levothyroxine  

## 2022-10-18 DIAGNOSIS — I480 Paroxysmal atrial fibrillation: Secondary | ICD-10-CM | POA: Diagnosis not present

## 2022-10-18 DIAGNOSIS — N179 Acute kidney failure, unspecified: Secondary | ICD-10-CM | POA: Diagnosis not present

## 2022-10-18 DIAGNOSIS — I5023 Acute on chronic systolic (congestive) heart failure: Secondary | ICD-10-CM | POA: Diagnosis not present

## 2022-10-18 DIAGNOSIS — I10 Essential (primary) hypertension: Secondary | ICD-10-CM | POA: Diagnosis not present

## 2022-10-18 LAB — BASIC METABOLIC PANEL
Anion gap: 9 (ref 5–15)
BUN: 24 mg/dL — ABNORMAL HIGH (ref 8–23)
CO2: 25 mmol/L (ref 22–32)
Calcium: 8.2 mg/dL — ABNORMAL LOW (ref 8.9–10.3)
Chloride: 105 mmol/L (ref 98–111)
Creatinine, Ser: 1.28 mg/dL — ABNORMAL HIGH (ref 0.44–1.00)
GFR, Estimated: 39 mL/min — ABNORMAL LOW (ref >60)
Glucose, Bld: 94 mg/dL (ref 70–99)
Potassium: 3.4 mmol/L — ABNORMAL LOW (ref 3.5–5.1)
Sodium: 139 mmol/L (ref 135–145)

## 2022-10-18 MED ORDER — POTASSIUM CHLORIDE CRYS ER 20 MEQ PO TBCR
40.0000 meq | EXTENDED_RELEASE_TABLET | Freq: Once | ORAL | Status: AC
  Administered 2022-10-18: 40 meq via ORAL
  Filled 2022-10-18: qty 2

## 2022-10-18 NOTE — Progress Notes (Addendum)
Progress Note   Patient: Amanda Cochran ZOX:[id number] DOB: 03-Jul-1928 DOA: 10/16/2022     2 DOS: the patient was seen and examined on 10/18/2022   Brief hospital course: Mrs. Reavey was admitted to the hospital with the working diagnosis of heart failure exacerbation.   89 yo female with the past medical history of paroxysmal atrial fibrillation, heart failure and hypertension, transferred from SNF to the ED due to mechanical fall. Patient sustained a mechanical fall from her own hight while ambulating. Apparently she tripped and fell, producing a right sided head trauma. No syncope. On her initial physical examination her blood pressure was 130/80, HR 76, RR 14 and 02 saturation 96%, lungs with no wheezing or rales, heart with S1 and S2 present irregularly irregular, abdomen with no distention and no lower extremity edema.   Head CT with old right parietal infarct. No acute changes.   Chest radiograph with cardiomegaly, bilateral hilar vascular congestion, fluid in the right fissure, no effusions.   EKG 74 bpm, right axis, normal intervals, atrial fibrillation rhythm, with PVC, no significant ST segment changes, negative T wave V4 to V6.   Her volume has improved.   08/24 transitioned to oral furosemide. Plan to stop apixaban due to fall and bleeding risk. 08/25 volume status has improved, plan to transfer to SNF in am.    Assessment and Plan: * Acute on chronic systolic heart failure (HCC) Echocardiogram with reduced LV systolic function EF 30 to 35%, LV cavity with mild dilatation, RV systolic function with moderate reduction, RVSP 39.2 mnmHg. LA and RA with severe dilatation. Moderate MR.   Urine output 1,300 cc Systolic blood pressure 111 to 130 mmHg.   Volume status has improved. Continue with furosemide 20 mg po daily.  Limited pharmacologic therapy due to cognitive impairment and poor mobility.   No SGLT 2 inh  due to risk of urinary tract infection.  No RAAS inhibition due to  recovering AKI.   Paroxysmal atrial fibrillation (HCC) Continue rate control with metoprolol. Considering ambulatory dysfunction and frequent falls, will hold on anticoagulation for now.   AKI (acute kidney injury) (HCC) Volume status has improved.   Renal function with serum cr at 1,28, with K at 3,4 and serum bicarbonate at 25.   Plan to continue diuresis with oral furosemide.  Kcl 40 meq today.  Follow up renal function and electrolytes in am.   Essential hypertension Continue blood pressure control with metoprolol.   Acquired hypothyroidism Continue with levothyroxine.   GAD (generalized anxiety disorder) Continue with buspirone, and escitalopram. Positive cognitive impairment and high risk for delirium.       Subjective: Patient is feeling better, denies any chest pain or dyspnea, has been confused and disorientated but not agitated.   Physical Exam: Vitals:   10/17/22 2000 10/18/22 0000 10/18/22 0400 10/18/22 0745  BP: (!) 108/59 137/76 (!) 123/97 (!) 111/90  Pulse: (!) 51 75 78 67  Resp: 16 20 16 15   Temp:  (!) 97.4 F (36.3 C) (!) 97.5 F (36.4 C) 98 F (36.7 C)  TempSrc:  Oral Oral Oral  SpO2: 97% 94% 95% 96%  Weight:   63 kg    Neurology awake and alert ENT with mild pallor, laceration on left upper orbital region Cardiovascular with S1 and S2 present, irregularly irregular with systolic murmur at the right sternal border. No JVD Respiratory with no rales or wheezing. Abdomen with no distention  No lower extremity edema  Data Reviewed:   Family Communication:  I spoke with patient's nice over the phone, we talked in detail about patient's condition, plan of care and prognosis and all questions were addressed.  Disposition: Status is: Inpatient Remains inpatient appropriate because: possible transfer to SNF in am.   Planned Discharge Destination: Skilled nursing facility     Author: Coralie Keens, MD 10/18/2022 10:28 AM  For on call  review [email or web address].

## 2022-10-18 NOTE — NC FL2 (Signed)
[place] MEDICAID FL2 LEVEL OF CARE FORM     IDENTIFICATION  Patient Name: Amanda Cochran Birthdate: [birth year] Sex: female Admission Date (Current Location): 10/16/2022  [facility] and [place] Number:  Producer, television/film/video and Address:  [facility] [place], [address] [place], [place] [address]      Provider Number: [id number]  Attending Physician Name and Address:  Coralie Keens,*  Relative Name and Phone Number:  Thayer Ohm [phone]    Current Level of Care: Hospital Recommended Level of Care: Skilled Nursing Facility Prior Approval Number:    Date Approved/Denied:   PASRR Number: [id number]  Discharge Plan: SNF    Current Diagnoses: Patient Active Problem List   Diagnosis Date Noted   Acute hypoxic respiratory failure (HCC) 10/17/2022   Paroxysmal atrial fibrillation (HCC) 10/17/2022   Recurrent falls 10/17/2022   GAD (generalized anxiety disorder) 10/17/2022   Acute on chronic systolic heart failure (HCC) 10/16/2022   Pneumonia of left lower lobe due to infectious organism 08/23/2022   Chronic systolic CHF (congestive heart failure) (HCC) 08/22/2022   E-coli UTI 08/22/2022   Acquired hypothyroidism 08/22/2022   Paranoid delusion (HCC) 08/21/2022   Acute HFrEF (heart failure with reduced ejection fraction) (HCC) 08/21/2022   Osteoarthritis 08/21/2022   AKI (acute kidney injury) (HCC) 08/21/2022   Acute metabolic encephalopathy 08/20/2022   Bilateral knee pain 11/27/2021   Pseudohallucination 10/05/2019   Intermediate stage nonexudative age-related macular degeneration of both eyes 10/05/2019   Optic disc atrophy 10/05/2019   Primary open angle glaucoma of both eyes, severe stage 10/05/2019   Low back pain 10/05/2019   MCI (mild cognitive impairment) 10/04/2019   History of CVA (cerebrovascular accident) 01/13/2019   Atrial fibrillation, chronic (HCC) 11/03/2018   Left sided lacunar stroke (HCC) 02/14/2014   Right hemiparesis  (HCC) 02/09/2014   Hyperlipidemia    Essential hypertension 02/03/2014    Orientation RESPIRATION BLADDER Height & Weight        O2 Incontinent, External catheter Weight: 138 lb 14.2 oz (63 kg) Height:     BEHAVIORAL SYMPTOMS/MOOD NEUROLOGICAL BOWEL NUTRITION STATUS      Continent Diet (See DC summary)  AMBULATORY STATUS COMMUNICATION OF NEEDS Skin   Limited Assist Verbally Skin abrasions, Bruising (Laceration on face)                       Personal Care Assistance Level of Assistance  Bathing, Feeding, Dressing Bathing Assistance: Limited assistance Feeding assistance: Limited assistance Dressing Assistance: Maximum assistance     Functional Limitations Info  Sight, Hearing, Speech Sight Info: Impaired Hearing Info: Impaired Speech Info: Adequate    SPECIAL CARE FACTORS FREQUENCY  PT (By licensed PT), OT (By licensed OT)     PT Frequency: 5x per week OT Frequency: 5x per week            Contractures Contractures Info: Not present    Additional Factors Info  Code Status, Allergies Code Status Info: Full Allergies Info: NKA           Current Medications (10/18/2022):  This is the current hospital active medication list Current Facility-Administered Medications  Medication Dose Route Frequency Provider Last Rate Last Admin   acetaminophen (TYLENOL) tablet 650 mg  650 mg Oral Q6H PRN Howerter, Justin B, DO       Or   acetaminophen (TYLENOL) suppository 650 mg  650 mg Rectal Q6H PRN Howerter, Justin B, DO  busPIRone (BUSPAR) tablet 5 mg  5 mg Oral BID Howerter, Justin B, DO   5 mg at 10/18/22 1003   escitalopram (LEXAPRO) tablet 5 mg  5 mg Oral Daily Howerter, Justin B, DO   5 mg at 10/18/22 1002   furosemide (LASIX) tablet 20 mg  20 mg Oral Daily Arrien, York Ram, MD   20 mg at 10/18/22 1002   levothyroxine (SYNTHROID) tablet 100 mcg  100 mcg Oral QAC breakfast Howerter, Justin B, DO   100 mcg at 10/18/22 0800   melatonin tablet 3 mg  3 mg  Oral QHS PRN Howerter, Justin B, DO       metoprolol succinate (TOPROL-XL) 24 hr tablet 25 mg  25 mg Oral Daily Howerter, Justin B, DO   25 mg at 10/18/22 1002   ondansetron (ZOFRAN) injection 4 mg  4 mg Intravenous Q6H PRN Howerter, Justin B, DO         Discharge Medications: Please see discharge summary for a list of discharge medications.  Relevant Imaging Results:  Relevant Lab Results:   Additional Information SSN#  [id number]  Patrice Paradise, LCSW

## 2022-10-18 NOTE — TOC CAGE-AID Note (Signed)
Transition of Care [facility]) - CAGE-AID Screening   Patient Details  Name: Amanda Cochran MRN: [id number] Date of Birth: 08-30-1928   Hewitt Shorts, RN Trauma Response Nurse Phone Number: [phone] 10/18/2022, 2:23 PM      CAGE-AID Screening: Substance Abuse Screening unable to be completed due to: : Patient unable to participate (pt is confused, resides in SNF)

## 2022-10-18 NOTE — Procedures (Signed)
CSW attempted to reach out to pt's nephew Thayer Ohm to discuss discharge planning. Pt is not oriented. CSW had to leave VM.   TOC team will continue to assist with discharge planning needs.

## 2022-10-18 NOTE — TOC Initial Note (Signed)
Transition of Care [facility]) - Initial/Assessment Note    Patient Details  Name: Amanda Cochran MRN: [id number] Date of Birth: 08/03/1928  Transition of Care [facility]) CM/SW Contact:    Patrice Paradise, LCSW Phone Number: 10/18/2022, 12:27 PM  Clinical Narrative:                  CSW spoke with pt's nephew Thayer Ohm and he confirmed that pt was a STR resident at [facility]. He has requested that pt's referral be sent out to other facilities. Thayer Ohm is concerned about the number of falls that pt has had since at facility. Thayer Ohm wants to see what other come back for pt.  CSW and Thayer Ohm discussed possibly LTC placement in the future. Thayer Ohm states that he is aware of the medicare.gov website for the facility ratings. Thayer Ohm states that once offers have come back that he will make a decision. Thayer Ohm gave permission for referral to be sent out to other facilities.  TOC team will continue to assist with discharge planning needs.          Patient Goals and CMS Choice            Expected Discharge Plan and Services                                              Prior Living Arrangements/Services                       Activities of Daily Living      Permission Sought/Granted                  Emotional Assessment              Admission diagnosis:  Acute on chronic systolic heart failure (HCC) [I50.23] AKI (acute kidney injury) (HCC) [N17.9] Facial laceration, initial encounter [S01.81XA] Fall, initial encounter [W19.XXXA] Patient Active Problem List   Diagnosis Date Noted   Acute hypoxic respiratory failure (HCC) 10/17/2022   Paroxysmal atrial fibrillation (HCC) 10/17/2022   Recurrent falls 10/17/2022   GAD (generalized anxiety disorder) 10/17/2022   Acute on chronic systolic heart failure (HCC) 10/16/2022   Pneumonia of left lower lobe due to infectious organism 08/23/2022   Chronic systolic CHF (congestive heart failure) (HCC) 08/22/2022   E-coli UTI  08/22/2022   Acquired hypothyroidism 08/22/2022   Paranoid delusion (HCC) 08/21/2022   Acute HFrEF (heart failure with reduced ejection fraction) (HCC) 08/21/2022   Osteoarthritis 08/21/2022   AKI (acute kidney injury) (HCC) 08/21/2022   Acute metabolic encephalopathy 08/20/2022   Bilateral knee pain 11/27/2021   Pseudohallucination 10/05/2019   Intermediate stage nonexudative age-related macular degeneration of both eyes 10/05/2019   Optic disc atrophy 10/05/2019   Primary open angle glaucoma of both eyes, severe stage 10/05/2019   Low back pain 10/05/2019   MCI (mild cognitive impairment) 10/04/2019   History of CVA (cerebrovascular accident) 01/13/2019   Atrial fibrillation, chronic (HCC) 11/03/2018   Left sided lacunar stroke (HCC) 02/14/2014   Right hemiparesis (HCC) 02/09/2014   Hyperlipidemia    Essential hypertension 02/03/2014   PCP:  [name], MD Pharmacy:   [facility] - [place], [place] - [address] [place] [address] Phone: [phone] Fax: [phone]     Social Determinants of Health (SDOH) Social History: SDOH  Screenings   Food Insecurity: No Food Insecurity (08/21/2022)  Housing: Low Risk  (08/21/2022)  Transportation Needs: No Transportation Needs (08/21/2022)  Recent Concern: Transportation Needs - Unmet Transportation Needs (08/20/2022)  Utilities: Not At Risk (08/21/2022)  Tobacco Use: Low Risk  (10/16/2022)   SDOH Interventions:     Readmission Risk Interventions     No data to display

## 2022-10-19 DIAGNOSIS — N179 Acute kidney failure, unspecified: Secondary | ICD-10-CM | POA: Diagnosis not present

## 2022-10-19 DIAGNOSIS — I480 Paroxysmal atrial fibrillation: Secondary | ICD-10-CM | POA: Diagnosis not present

## 2022-10-19 DIAGNOSIS — I10 Essential (primary) hypertension: Secondary | ICD-10-CM | POA: Diagnosis not present

## 2022-10-19 DIAGNOSIS — I5023 Acute on chronic systolic (congestive) heart failure: Secondary | ICD-10-CM | POA: Diagnosis not present

## 2022-10-19 LAB — BASIC METABOLIC PANEL
Anion gap: 11 (ref 5–15)
BUN: 23 mg/dL (ref 8–23)
CO2: 27 mmol/L (ref 22–32)
Calcium: 8.2 mg/dL — ABNORMAL LOW (ref 8.9–10.3)
Chloride: 102 mmol/L (ref 98–111)
Creatinine, Ser: 1.26 mg/dL — ABNORMAL HIGH (ref 0.44–1.00)
GFR, Estimated: 40 mL/min — ABNORMAL LOW (ref >60)
Glucose, Bld: 97 mg/dL (ref 70–99)
Potassium: 4.1 mmol/L (ref 3.5–5.1)
Sodium: 140 mmol/L (ref 135–145)

## 2022-10-19 LAB — MAGNESIUM: Magnesium: 1.8 mg/dL (ref 1.7–2.4)

## 2022-10-19 MED ORDER — SPIRONOLACTONE 25 MG PO TABS: 12.5000 mg | ORAL_TABLET | Freq: Every day | ORAL | 0 refills | Status: AC

## 2022-10-19 MED ORDER — SPIRONOLACTONE 12.5 MG HALF TABLET
12.5000 mg | ORAL_TABLET | Freq: Every day | ORAL | Status: DC
  Administered 2022-10-19: 12.5 mg via ORAL
  Filled 2022-10-19: qty 1

## 2022-10-19 MED ORDER — FUROSEMIDE 20 MG PO TABS: 20.0000 mg | ORAL_TABLET | Freq: Every day | ORAL | 0 refills | Status: AC

## 2022-10-19 NOTE — Progress Notes (Signed)
Report called to receiving RN at [facility]. [facility] arrived to pick up pt. PIV removed and pt transferred to [facility] stretcher. Discharge packet with gold DNR form sent w/ [facility].

## 2022-10-19 NOTE — Discharge Summary (Addendum)
Physician Discharge Summary   Patient: Amanda Cochran MRN: [id number] DOB: 17-Sep-1928  Admit date:     10/16/2022  Discharge date: 10/19/22  Discharge Physician: York Ram Dazha Kempa   PCP: [name], MD   Recommendations at discharge:    Patient has been placed on spironolactone for heart failure with reduced LV systolic function.  Diuresis with furosemide 20 mg daily.  Possible addition of ARB as outpatient, limited pharmacologic options due to poor mobility, cognitive impairment and frailty. Discontinue apixaban due to frequent falls and high risk of bleeding. Follow up with Dr Waynard Edwards in 7 to 10 days. Follow up renal function and electrolytes in 7 days as outpatient.    I spoke with patient's nice and nephew (Mrs. Malen Gauze Mr. Orson Slick ) over the phone, we talked in detail about patient's condition, plan of care and prognosis and all questions were addressed. The family will get together and make decision about code status. Certainly considering Mrs. Sanpedro condition, in case of a cardiac arrest her prognosis will be extremely poor.   Late entry, Mr. Orson Slick has informed the team that Mrs. Dehn is DNR.   Discharge Diagnoses: Principal Problem:   Acute on chronic systolic heart failure (HCC) Active Problems:   Paroxysmal atrial fibrillation (HCC)   AKI (acute kidney injury) (HCC)   Essential hypertension   Acquired hypothyroidism   GAD (generalized anxiety disorder)  Resolved Problems:   * No resolved hospital problems. [facility] Course: Mrs. Lamagna was admitted to the hospital with the working diagnosis of heart failure exacerbation.   89 yo female with the past medical history of paroxysmal atrial fibrillation, heart failure and hypertension, transferred from SNF to the ED due to mechanical fall. Patient sustained a mechanical fall from her own hight while ambulating. Apparently she tripped and fell, producing a right sided head trauma. No syncope. On her initial physical  examination her blood pressure was 130/80, HR 76, RR 14 and 02 saturation 96%, lungs with no wheezing or rales, heart with S1 and S2 present irregularly irregular, abdomen with no distention and no lower extremity edema.   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 No acute changes.   Chest radiograph with cardiomegaly, bilateral hilar vascular congestion, fluid in the right fissure, no effusions.   EKG 74 bpm, right axis, normal intervals, atrial fibrillation rhythm, with PVC, no significant ST segment changes, negative T wave V4 to V6.   Her volume has improved.   08/24 transitioned to oral furosemide. Plan to stop apixaban due to fall and bleeding risk. 08/25 volume status has improved, plan to transfer to SNF in am.  08/26 transfer to SNF today, continue furosemide as needed for signs of volume overload.    Assessment and Plan: * Acute on chronic systolic heart failure (HCC) Echocardiogram with reduced LV systolic function EF 30 to 35%, LV cavity with mild dilatation, RV systolic function with moderate reduction, RVSP 39.2 mnmHg. LA and RA with severe dilatation. Moderate MR.   Patient was placed on IV furosemide for diuresis, negative fluid balance was achieved, patient loss about 2 Kg during this hospitalization. Systolic blood pressure overt last 24 hrs 118 to 123 mmHg.   Plan to continue medical therapy with spironolactone. Diuresis with furosemide.  Possible addition of ARB as outpatient.  Limited pharmacologic options due to advance age and poor mobility, along with cognitive impairment.    No SGLT 2 inh  due to risk of urinary tract infection.   Paroxysmal atrial fibrillation (HCC) Continue rate control with metoprolol. Considering ambulatory dysfunction and frequent falls, will hold on anticoagulation. Discussed with patient's nice.    AKI (acute kidney injury) (HCC) Hypokalemia.  Improved volume status.   Renal function at the time of her discharge with serum cr at 1,26, K at 4,1 and serum bicarbonate at 27. Na 140 and Mg 1,8   Will continue as outpatient furosemide and spironolactone.  Follow up renal function and electrolytes as outpatient in 7 days.   Essential hypertension Continue blood pressure control with metoprolol.   Acquired hypothyroidism Continue with levothyroxine.   GAD (generalized anxiety disorder) Continue with buspirone, and escitalopram. Positive cognitive impairment and decreased vision.  She has a high risk for developing delirium.    Consultants: none  Procedures performed: none   Disposition: Home Diet recommendation:  Regular diet with fluid restriction 1200 ml per day and less than 2 g of salt per day.  DISCHARGE MEDICATION: Allergies as of 10/19/2022   No Known Allergies      Medication List     STOP taking these medications    apixaban 2.5 MG Tabs tablet Commonly known as: ELIQUIS   benazepril 20 MG tablet Commonly known as: LOTENSIN   cefTRIAXone IVPB Commonly known as: ROCEPHIN       TAKE these medications    busPIRone 5 MG tablet Commonly known as: BUSPAR Take 5 mg by mouth 2 (two) times daily.   CENTRUM SILVER PO Take 1 tablet by mouth daily.   divalproex 125 MG capsule Commonly known as: DEPAKOTE SPRINKLE Take 1 capsule (125 mg total) by mouth every 8 (eight) hours.   escitalopram 5 MG tablet Commonly known as: LEXAPRO Take 5 mg by mouth daily.   Fish Oil 1000 MG Caps Take 1,000 mg by mouth daily.   furosemide 20 MG tablet Commonly known as: LASIX Take 1 tablet (20 mg total) by mouth daily.   latanoprost 0.005 % ophthalmic solution Commonly known as: XALATAN Place 1 drop into both eyes at bedtime.   levothyroxine 100 MCG tablet Commonly known as: SYNTHROID Take 1 tablet (100 mcg total) by mouth daily.   metoprolol succinate 50 MG  24 hr tablet Commonly known as: TOPROL-XL Take 1 tablet (50 mg total) by mouth daily. Take with or immediately following a meal.   NUTRITIONAL SHAKE PO Take 237 mLs by mouth in the morning, at noon, and at bedtime. House supplement   pilocarpine 1 % ophthalmic solution Commonly known as: PILOCAR Place 1 drop into both eyes 4 (four) times daily.   spironolactone 25 MG tablet Commonly known as: ALDACTONE Take 0.5 tablets (12.5 mg total) by mouth daily.   timolol 0.5 % ophthalmic solution Commonly known as: TIMOPTIC Place 1 drop into both eyes at bedtime.   VITAMIN C PO Take 1 tablet by mouth daily.        Discharge Exam: Filed Weights   10/17/22 0451 10/18/22 0400 10/19/22 0400  Weight: 60.5 kg 63 kg 58.7 kg   BP 118/85 (BP Location: Left Arm)   Pulse 60   Temp 97.8 F (36.6 C) (Axillary)   Resp (!) 30   Wt 58.7 kg   SpO2 96%   BMI 21.53 kg/m   Patient with no chest pain or dyspnea, no PND or orthopnea  Neurology  awake and alert ENT with mild pallor Cardiovascular with S1 and S2 present, irregularly irregular with no gallops, rubs or murmurs Respiratory with no rales or wheezing, no rhonchi Abdomen with no distention  No lower extremity edema   Condition at discharge: stable  The results of significant diagnostics from this hospitalization (including imaging, microbiology, ancillary and laboratory) are listed below for reference.   Imaging Studies: CT T-SPINE NO CHARGE  Result Date: 10/16/2022 CLINICAL DATA:  Fall with blunt polytrauma. EXAM: CT THORACIC AND LUMBAR SPINE WITHOUT CONTRAST TECHNIQUE: Multidetector CT imaging of the thoracic and lumbar spine was performed without intravenous contrast. Multiplanar CT image reconstructions were also generated. RADIATION DOSE REDUCTION: This exam was performed according to the departmental dose-optimization program which includes automated exposure control, adjustment of the mA and/or kV according to patient size  and/or use of iterative reconstruction technique. COMPARISON:  CT abdomen and pelvis and reconstructions dated 10/01/2022, contemporaneous chest CT. FINDINGS: CT THORACIC SPINE FINDINGS Alignment: There is mild thoracic kyphodextroscoliosis, without AP listhesis. Vertebrae: No acute fracture or focal pathologic process is seen. There is osteopenia. There is spondylosis, with multilevel right anterolateral bridging enthesopathy starting at T3 and continuing through T10-11. Paraspinal and other soft tissues: No acute findings. Small right-greater-than-left pleural effusions are unchanged since 10/01/2022. No paraspinal hematoma or reactive change. Disc levels: The thoracic discs are degenerated throughout 2 varying degrees. The greatest disc collapse at T5-6, T6-7, T7-8 and T8-9. No herniated discs or cord compromise are suspected. Dorsal ligamentous hypertrophic ossification on the right impresses mildly on the right dorsolateral thecal sac at T11-12. There are slight facet spurring changes at the lower thoracic levels, but no thoracic spine levels demonstrate significant foraminal encroachment. CT LUMBAR SPINE FINDINGS Segmentation: 5 lumbar type vertebrae. Alignment: Chronic grade 1 L5-S1 anterolisthesis is again noted due to chronic L5 pars defects. Alignment is otherwise within normal limits. Vertebrae: There is osteopenia. There is preservation of the normal vertebral body heights. No acute lumbar fracture is seen or focal pathologic process. There is moderate marginal osteophytosis, most prominently from L3-4 down. Paraspinal and other soft tissues: Heavy aortoiliac atherosclerosis. No acute findings. Disc levels: The discs are variably degenerated, with vacuum phenomenon noted at L2-3, L4-5 and L5-S1. The greatest disc space loss is at the lowest 2 levels, with relatively mild disc space loss at L1-2, L2-3 and L3-4. There is normal disc height at T12-L1. L1-2: There has a posterior disc osteophyte complex  causing mild spinal canal stenosis without significant lateral recess encroachment. There are right-greater-than-left facet spurs but no foraminal stenosis. L2-3: Severe acquired spinal stenosis again noted due to circumferential disc osteophyte complex, advanced facet hypertrophy, and ligamentous thickening. Both lateral recesses are effaced. There is moderate right and mild left foraminal stenosis. L3-4: There is a calcified central disc extrusion. Again noted severe acquired spinal stenosis due to the disc extrusion, superimposed circumferential disc osteophyte complex, advanced facet hypertrophy, with ligamentous thickening. Both lateral recesses are effaced. There is moderate to severe right and mild left foraminal stenosis. L4-5: Mild-to-moderate facet hypertrophy partially effaces the lateral recesses. There is a mild nonstenosing posterior disc osteophyte complex without herniation. There is moderate right-greater-than-left foraminal stenosis. L5-S1: Chronic L5 pars defects. Grade 1 L5-S1 spondylolisthesis with disc collapse and vacuum phenomenon. No herniated disc. Disc space air again extends into the right lateral recess. There are bilateral facet spurs. There is bilateral severe acquired foraminal stenosis. There is ankylosis across the left SI joint, spurring of both SI joints are patent right  SI joint. No sacral insufficiency fracture is seen. IMPRESSION: 1. Thoracic spine degenerative changes and mild kyphodextroscoliosis without evidence of acute fractures. 2. Small right-greater-than-left pleural effusions, unchanged since 10/01/2022. 3. Lumbar osteopenia and degenerative change without evidence of acute fractures. 4. Chronic L5 pars defects with grade 1 L5-S1 spondylolisthesis. 5. Severe acquired spinal stenosis at L2-3 and L3-4 with bilateral lateral recess effacement, and bilateral foraminal stenosis. 6. Lateral recess effacement at L4-5 with moderate right-greater-than-left foraminal stenosis.  7. Severe bilateral L5-S1 foraminal stenosis. 8. Aortic atherosclerosis. Aortic Atherosclerosis (ICD10-I70.0). Electronically Signed   By: Almira Bar M.D.   On: 10/16/2022 23:18   CT L-SPINE NO CHARGE  Result Date: 10/16/2022 CLINICAL DATA:  Fall with blunt polytrauma. EXAM: CT THORACIC AND LUMBAR SPINE WITHOUT CONTRAST TECHNIQUE: Multidetector CT imaging of the thoracic and lumbar spine was performed without intravenous contrast. Multiplanar CT image reconstructions were also generated. RADIATION DOSE REDUCTION: This exam was performed according to the departmental dose-optimization program which includes automated exposure control, adjustment of the mA and/or kV according to patient size and/or use of iterative reconstruction technique. COMPARISON:  CT abdomen and pelvis and reconstructions dated 10/01/2022, contemporaneous chest CT. FINDINGS: CT THORACIC SPINE FINDINGS Alignment: There is mild thoracic kyphodextroscoliosis, without AP listhesis. Vertebrae: No acute fracture or focal pathologic process is seen. There is osteopenia. There is spondylosis, with multilevel right anterolateral bridging enthesopathy starting at T3 and continuing through T10-11. Paraspinal and other soft tissues: No acute findings. Small right-greater-than-left pleural effusions are unchanged since 10/01/2022. No paraspinal hematoma or reactive change. Disc levels: The thoracic discs are degenerated throughout 2 varying degrees. The greatest disc collapse at T5-6, T6-7, T7-8 and T8-9. No herniated discs or cord compromise are suspected. Dorsal ligamentous hypertrophic ossification on the right impresses mildly on the right dorsolateral thecal sac at T11-12. There are slight facet spurring changes at the lower thoracic levels, but no thoracic spine levels demonstrate significant foraminal encroachment. CT LUMBAR SPINE FINDINGS Segmentation: 5 lumbar type vertebrae. Alignment: Chronic grade 1 L5-S1 anterolisthesis is again noted  due to chronic L5 pars defects. Alignment is otherwise within normal limits. Vertebrae: There is osteopenia. There is preservation of the normal vertebral body heights. No acute lumbar fracture is seen or focal pathologic process. There is moderate marginal osteophytosis, most prominently from L3-4 down. Paraspinal and other soft tissues: Heavy aortoiliac atherosclerosis. No acute findings. Disc levels: The discs are variably degenerated, with vacuum phenomenon noted at L2-3, L4-5 and L5-S1. The greatest disc space loss is at the lowest 2 levels, with relatively mild disc space loss at L1-2, L2-3 and L3-4. There is normal disc height at T12-L1. L1-2: There has a posterior disc osteophyte complex causing mild spinal canal stenosis without significant lateral recess encroachment. There are right-greater-than-left facet spurs but no foraminal stenosis. L2-3: Severe acquired spinal stenosis again noted due to circumferential disc osteophyte complex, advanced facet hypertrophy, and ligamentous thickening. Both lateral recesses are effaced. There is moderate right and mild left foraminal stenosis. L3-4: There is a calcified central disc extrusion. Again noted severe acquired spinal stenosis due to the disc extrusion, superimposed circumferential disc osteophyte complex, advanced facet hypertrophy, with ligamentous thickening. Both lateral recesses are effaced. There is moderate to severe right and mild left foraminal stenosis. L4-5: Mild-to-moderate facet hypertrophy partially effaces the lateral recesses. There is a mild nonstenosing posterior disc osteophyte complex without herniation. There is moderate right-greater-than-left foraminal stenosis. L5-S1: Chronic L5 pars defects. Grade 1 L5-S1 spondylolisthesis with disc collapse and vacuum phenomenon. No  herniated disc. Disc space air again extends into the right lateral recess. There are bilateral facet spurs. There is bilateral severe acquired foraminal stenosis. There  is ankylosis across the left SI joint, spurring of both SI joints are patent right SI joint. No sacral insufficiency fracture is seen. IMPRESSION: 1. Thoracic spine degenerative changes and mild kyphodextroscoliosis without evidence of acute fractures. 2. Small right-greater-than-left pleural effusions, unchanged since 10/01/2022. 3. Lumbar osteopenia and degenerative change without evidence of acute fractures. 4. Chronic L5 pars defects with grade 1 L5-S1 spondylolisthesis. 5. Severe acquired spinal stenosis at L2-3 and L3-4 with bilateral lateral recess effacement, and bilateral foraminal stenosis. 6. Lateral recess effacement at L4-5 with moderate right-greater-than-left foraminal stenosis. 7. Severe bilateral L5-S1 foraminal stenosis. 8. Aortic atherosclerosis. Aortic Atherosclerosis (ICD10-I70.0). Electronically Signed   By: Almira Bar M.D.   On: 10/16/2022 23:18   CT MAXILLOFACIAL WO CONTRAST  Result Date: 10/16/2022 CLINICAL DATA:  Level 2 fall with blunt poly fusion trauma on blood thinners. EXAM: CT MAXILLOFACIAL WITHOUT CONTRAST CT CHEST, ABDOMEN AND PELVIS WITH CONTRAST TECHNIQUE: Multidetector CT imaging of the maxillofacial structures was performed. Multiplanar CT image reconstructions were also generated. A small metallic BB was placed on the right temple in order to reliably differentiate right from left. Multidetector CT imaging of the chest, abdomen and pelvis was performed following the standard protocol during bolus administration of intravenous contrast. RADIATION DOSE REDUCTION: This exam was performed according to the departmental dose-optimization program which includes automated exposure control, adjustment of the mA and/or kV according to patient size and/or use of iterative reconstruction technique. CONTRAST:  60mL OMNIPAQUE IOHEXOL 350 MG/ML SOLN COMPARISON:  CT scan head without contrast 09/30/2021, CT abdomen and pelvis with contrast 10/01/2022, chest CT with contrast 08/19/2000,  abdomen and pelvis CT no contrast 02/04/2006. FINDINGS: CT MAXILLOFACIAL FINDINGS Osseous: Chronic slightly depressed fracture, right side of the nasal bone. No evidence of acute facial fracture. No mandibular dislocation is seen. There is asymmetric joint space loss and remodeling of the left TMJ. There is osteopenia. Orbits: Negative. No traumatic or inflammatory finding. Again noted are old lens replacements. Sinuses: There is mild membrane thickening in the paranasal sinuses without fluid levels. There is a broad defect in the cartilaginous nasal septum which may be postsurgical. There is a small defect in the medial wall of the right maxillary sinus alongside the middle nasal meatus which also may be postsurgical such as due to prior meatotomy. Both of the ostiomeatal complexes are clear. The turbinates are intact. Moderate to severe deviation of the nasal septum to the left is again noted. The mastoid air cells are clear.  The middle ears are clear. Soft tissues: There is laceration in the lateral aspect of the right brow with underlying soft tissue gas. There is a small amount of air in the left external jugular vein and right IJ vein, most likely injected placement of the patient's IV. The epiglottis and tongue base appear normal. Both proximal cervical ICAs are heavily calcified with likely flow-limiting stenoses. CT CHEST FINDINGS Cardiovascular: Moderate to severe cardiomegaly is again noted with a right chamber predominance indicating likely chronic right heart dysfunction. There are three-vessel coronary artery calcifications. No pericardial effusion. Pulmonary arteries are normal caliber and centrally clear. There are prominent superior pulmonary veins. There is aortic atherosclerosis tortuosity and scattered calcific plaques in the great vessels but no stenosis, dissection or aneurysm. Mediastinum/Nodes: No enlarged mediastinal, hilar, or axillary lymph nodes. Thyroid gland, trachea, and esophagus  demonstrate no significant  findings. Lungs/Pleura: Again noted are small right-greater-than-left layering pleural effusions, with adjacent atelectasis in the lower lobes, unchanged. Small amount of apical pleural-parenchymal scarring is similar to 2002. There is diffuse bronchial thickening. The lungs demonstrate mild mosaicism most likely due to small airways disease with air trapping. Scattered linear scar-like opacities both bases. Again there appears to be slight interstitial edema in both lung bases but I do not see evidence for airspace edema. This was seen previously.  No suspicious pulmonary nodule. Musculoskeletal: There is osteopenia, degenerative changes of the spine including multilevel bridging enthesopathy, and chronic mild thoracic kyphodextroscoliosis. No spinal compression fracture is seen. The ribcage, shoulder girdles and sternum demonstrate no evidence of an acute fracture. There is a nondisplaced subacute fracture of the anterolateral right fifth rib with evidence of early healing. This is above the plane of the prior abdomen pelvis CT. There are dystrophic calcifications in both breasts consistent with fibrocystic disease but no chest wall hematoma or mass. CT ABDOMEN AND PELVIS FINDINGS Hepatobiliary: Single small stone in the proximal gallbladder. No wall thickening. No biliary dilatation. Unremarkable liver without obvious laceration or adjacent hematoma. Pancreas: Partially atrophic.  Otherwise unremarkable. Spleen: No abnormality. Adrenals/Urinary Tract: Adrenal glands are unremarkable. Kidneys are normal, without renal calculi, focal lesion, or hydronephrosis. Bladder is unremarkable. Stomach/Bowel: No dilatation or wall thickening. Moderate fecal stasis. Sigmoid diverticulosis without evidence of diverticulitis. Normal caliber appendix. Vascular/Lymphatic: Heavy aortoiliac calcific plaque. Multifocal ostial branch vessel atherosclerosis. No AAA. No adenopathy. Reproductive: Uterus and  bilateral adnexa are unremarkable. Other: Trace ascites in the posterior deep pelvis. Mild increased edema extends along the body wall, greater in the dependent areas. There is no free hemorrhage, free air, or focal inflammatory process. Small umbilical fat hernia. Musculoskeletal: Chronic L5 pars defects and grade 1 L5-S1 spondylolisthesis with chronic disc collapse with vacuum phenomenon. Osteopenia and degenerative change remaining lumbar spine. No acute regional skeletal fractures. Ankylosis left SI joint. Severe acquired spinal stenosis at L2-3, L3-4. IMPRESSION: 1. Laceration in the lateral right brow with underlying soft tissue gas. No acute facial or orbital fractures. 2. Osteopenia and degenerative change. 3. Cardiomegaly with right chamber predominance, prominent superior pulmonary veins, small right-greater-than-left layering pleural effusions with probable slight interstitial edema in the lung bases. Similar findings on abdomen and pelvis CT 10/01/2022. 4. Bronchitis with mosaicism in the lungs most likely due to small airways disease with air trapping. 5. Aortic and coronary artery atherosclerosis. 6. Cholelithiasis. 7. Constipation and diverticulosis. 8. Trace ascites in the posterior deep pelvis. No free air or focal inflammatory process. Increased body wall edema. 9. Chronic L5 pars defects with grade 1 L5-S1 spondylolisthesis and chronic disc collapse. 10. Severe acquired spinal stenosis at L2-3 and L3-4. 11. Subacute nondisplaced fracture of the anterolateral right fifth rib with evidence of early healing. No acute regional skeletal fractures are seen. 12. Small amount of air in the left EJ vein and right IJ vein, most likely iatrogenic. Aortic Atherosclerosis (ICD10-I70.0). Electronically Signed   By: Almira Bar M.D.   On: 10/16/2022 22:47   CT CHEST ABDOMEN PELVIS W CONTRAST  Result Date: 10/16/2022 CLINICAL DATA:  Level 2 fall with blunt poly fusion trauma on blood thinners. EXAM: CT  MAXILLOFACIAL WITHOUT CONTRAST CT CHEST, ABDOMEN AND PELVIS WITH CONTRAST TECHNIQUE: Multidetector CT imaging of the maxillofacial structures was performed. Multiplanar CT image reconstructions were also generated. A small metallic BB was placed on the right temple in order to reliably differentiate right from left. Multidetector CT imaging of the  chest, abdomen and pelvis was performed following the standard protocol during bolus administration of intravenous contrast. RADIATION DOSE REDUCTION: This exam was performed according to the departmental dose-optimization program which includes automated exposure control, adjustment of the mA and/or kV according to patient size and/or use of iterative reconstruction technique. CONTRAST:  60mL OMNIPAQUE IOHEXOL 350 MG/ML SOLN COMPARISON:  CT scan head without contrast 09/30/2021, CT abdomen and pelvis with contrast 10/01/2022, chest CT with contrast 08/19/2000, abdomen and pelvis CT no contrast 02/04/2006. FINDINGS: CT MAXILLOFACIAL FINDINGS Osseous: Chronic slightly depressed fracture, right side of the nasal bone. No evidence of acute facial fracture. No mandibular dislocation is seen. There is asymmetric joint space loss and remodeling of the left TMJ. There is osteopenia. Orbits: Negative. No traumatic or inflammatory finding. Again noted are old lens replacements. Sinuses: There is mild membrane thickening in the paranasal sinuses without fluid levels. There is a broad defect in the cartilaginous nasal septum which may be postsurgical. There is a small defect in the medial wall of the right maxillary sinus alongside the middle nasal meatus which also may be postsurgical such as due to prior meatotomy. Both of the ostiomeatal complexes are clear. The turbinates are intact. Moderate to severe deviation of the nasal septum to the left is again noted. The mastoid air cells are clear.  The middle ears are clear. Soft tissues: There is laceration in the lateral aspect of  the right brow with underlying soft tissue gas. There is a small amount of air in the left external jugular vein and right IJ vein, most likely injected placement of the patient's IV. The epiglottis and tongue base appear normal. Both proximal cervical ICAs are heavily calcified with likely flow-limiting stenoses. CT CHEST FINDINGS Cardiovascular: Moderate to severe cardiomegaly is again noted with a right chamber predominance indicating likely chronic right heart dysfunction. There are three-vessel coronary artery calcifications. No pericardial effusion. Pulmonary arteries are normal caliber and centrally clear. There are prominent superior pulmonary veins. There is aortic atherosclerosis tortuosity and scattered calcific plaques in the great vessels but no stenosis, dissection or aneurysm. Mediastinum/Nodes: No enlarged mediastinal, hilar, or axillary lymph nodes. Thyroid gland, trachea, and esophagus demonstrate no significant findings. Lungs/Pleura: Again noted are small right-greater-than-left layering pleural effusions, with adjacent atelectasis in the lower lobes, unchanged. Small amount of apical pleural-parenchymal scarring is similar to 2002. There is diffuse bronchial thickening. The lungs demonstrate mild mosaicism most likely due to small airways disease with air trapping. Scattered linear scar-like opacities both bases. Again there appears to be slight interstitial edema in both lung bases but I do not see evidence for airspace edema. This was seen previously.  No suspicious pulmonary nodule. Musculoskeletal: There is osteopenia, degenerative changes of the spine including multilevel bridging enthesopathy, and chronic mild thoracic kyphodextroscoliosis. No spinal compression fracture is seen. The ribcage, shoulder girdles and sternum demonstrate no evidence of an acute fracture. There is a nondisplaced subacute fracture of the anterolateral right fifth rib with evidence of early healing. This is above  the plane of the prior abdomen pelvis CT. There are dystrophic calcifications in both breasts consistent with fibrocystic disease but no chest wall hematoma or mass. CT ABDOMEN AND PELVIS FINDINGS Hepatobiliary: Single small stone in the proximal gallbladder. No wall thickening. No biliary dilatation. Unremarkable liver without obvious laceration or adjacent hematoma. Pancreas: Partially atrophic.  Otherwise unremarkable. Spleen: No abnormality. Adrenals/Urinary Tract: Adrenal glands are unremarkable. Kidneys are normal, without renal calculi, focal lesion, or hydronephrosis. Bladder is unremarkable. Stomach/Bowel:  No dilatation or wall thickening. Moderate fecal stasis. Sigmoid diverticulosis without evidence of diverticulitis. Normal caliber appendix. Vascular/Lymphatic: Heavy aortoiliac calcific plaque. Multifocal ostial branch vessel atherosclerosis. No AAA. No adenopathy. Reproductive: Uterus and bilateral adnexa are unremarkable. Other: Trace ascites in the posterior deep pelvis. Mild increased edema extends along the body wall, greater in the dependent areas. There is no free hemorrhage, free air, or focal inflammatory process. Small umbilical fat hernia. Musculoskeletal: Chronic L5 pars defects and grade 1 L5-S1 spondylolisthesis with chronic disc collapse with vacuum phenomenon. Osteopenia and degenerative change remaining lumbar spine. No acute regional skeletal fractures. Ankylosis left SI joint. Severe acquired spinal stenosis at L2-3, L3-4. IMPRESSION: 1. Laceration in the lateral right brow with underlying soft tissue gas. No acute facial or orbital fractures. 2. Osteopenia and degenerative change. 3. Cardiomegaly with right chamber predominance, prominent superior pulmonary veins, small right-greater-than-left layering pleural effusions with probable slight interstitial edema in the lung bases. Similar findings on abdomen and pelvis CT 10/01/2022. 4. Bronchitis with mosaicism in the lungs most likely  due to small airways disease with air trapping. 5. Aortic and coronary artery atherosclerosis. 6. Cholelithiasis. 7. Constipation and diverticulosis. 8. Trace ascites in the posterior deep pelvis. No free air or focal inflammatory process. Increased body wall edema. 9. Chronic L5 pars defects with grade 1 L5-S1 spondylolisthesis and chronic disc collapse. 10. Severe acquired spinal stenosis at L2-3 and L3-4. 11. Subacute nondisplaced fracture of the anterolateral right fifth rib with evidence of early healing. No acute regional skeletal fractures are seen. 12. Small amount of air in the left EJ vein and right IJ vein, most likely iatrogenic. Aortic Atherosclerosis (ICD10-I70.0). Electronically Signed   By: Almira Bar M.D.   On: 10/16/2022 22:47   CT CERVICAL SPINE WO CONTRAST  Result Date: 10/16/2022 CLINICAL DATA:  Polytrauma, blunt.  Fall EXAM: CT CERVICAL SPINE WITHOUT CONTRAST TECHNIQUE: Multidetector CT imaging of the cervical spine was performed without intravenous contrast. Multiplanar CT image reconstructions were also generated. RADIATION DOSE REDUCTION: This exam was performed according to the departmental dose-optimization program which includes automated exposure control, adjustment of the mA and/or kV according to patient size and/or use of iterative reconstruction technique. COMPARISON:  None Available. FINDINGS: Alignment: Normal Skull base and vertebrae: No acute fracture. No primary bone lesion or focal pathologic process. Soft tissues and spinal canal: No prevertebral fluid or swelling. No visible canal hematoma. Disc levels: Diffuse degenerative disc disease, most pronounced in the lower cervical spine. Diffuse advanced degenerative facet disease. Upper chest: Biapical scarring.  No acute findings. Other: None IMPRESSION: Degenerative disc and facet disease.  No acute bony abnormality. Electronically Signed   By: Charlett Nose M.D.   On: 10/16/2022 22:10   CT HEAD WO CONTRAST  Result  Date: 10/16/2022 CLINICAL DATA:  Head trauma, moderate-severe. Fall. On blood thinners. EXAM: CT HEAD WITHOUT CONTRAST TECHNIQUE: Contiguous axial images were obtained from the base of the skull through the vertex without intravenous contrast. RADIATION DOSE REDUCTION: This exam was performed according to the departmental dose-optimization program which includes automated exposure control, adjustment of the mA and/or kV according to patient size and/or use of iterative reconstruction technique. 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 There is atrophy and chronic small vessel disease changes. No acute intracranial abnormality. Specifically, no hemorrhage, hydrocephalus, mass lesion, acute infarction, or significant intracranial injury. Vascular: No hyperdense vessel or unexpected calcification. Skull: No acute calvarial abnormality. Sinuses/Orbits: No acute findings Other: None IMPRESSION: Old right  parietal infarct. Atrophy, chronic microvascular disease. No acute intracranial abnormality. Electronically Signed   By: Charlett Nose M.D.   On: 10/16/2022 22:07   DG Pelvis Portable  Result Date: 10/16/2022 CLINICAL DATA:  Trauma level 2 fall EXAM: PORTABLE PELVIS 1-2 VIEWS COMPARISON:  10/01/2022 FINDINGS: SI joints show mild degenerative change but no widening. Pubic symphysis and rami appear intact. No fracture or malalignment. Moderate bilateral hip degenerative changes IMPRESSION: No acute osseous abnormality. Electronically Signed   By: Jasmine Pang M.D.   On: 10/16/2022 21:53   DG Chest Portable 1 View  Result Date: 10/16/2022 CLINICAL DATA:  Trauma level 2 fall EXAM: PORTABLE CHEST 1 VIEW COMPARISON:  10/01/2022, CT 08/19/2000 FINDINGS: Cardiomegaly with mild diffuse interstitial opacities possible edema. Possible left effusion. Aortic atherosclerosis. No convincing pneumothorax IMPRESSION: Cardiomegaly with mild interstitial edema and possible small left  effusion. Electronically Signed   By: Jasmine Pang M.D.   On: 10/16/2022 21:49   CT ABDOMEN PELVIS W CONTRAST  Result Date: 10/01/2022 CLINICAL DATA:  89 year old female found down.  Right hip pain. EXAM: CT ABDOMEN AND PELVIS WITH CONTRAST TECHNIQUE: Multidetector CT imaging of the abdomen and pelvis was performed using the standard protocol following bolus administration of intravenous contrast. RADIATION DOSE REDUCTION: This exam was performed according to the departmental dose-optimization program which includes automated exposure control, adjustment of the mA and/or kV according to patient size and/or use of iterative reconstruction technique. CONTRAST:  75mL OMNIPAQUE IOHEXOL 300 MG/ML  SOLN COMPARISON:  CT Abdomen and Pelvis 02/04/2006. FINDINGS: Lower chest: Moderate to severe cardiomegaly. Layering small pleural effusions. Lung base respiratory motion artifact. No pericardial effusion. Hepatobiliary: Cholelithiasis on series 4, image 25. Mild motion artifact but no obvious gallbladder inflammation. Negative liver. Pancreas: Partially atrophied. Spleen: Negative. Adrenals/Urinary Tract: Normal adrenal glands. Nonobstructed kidneys with symmetric renal enhancement and early contrast excretion. Decompressed ureters. Decompressed bladder. Occasional pelvic phleboliths. Stomach/Bowel: Mild retained gas and stool in the rectum. Moderate to severe sigmoid diverticulosis with no active inflammation identified. Decompressed descending colon. Mild motion artifact in the mid and upper abdomen. Transverse colon and right colon appear negative. Normal appendix on series 4, image 51. Decompressed terminal ileum and no dilated small bowel. Decompressed stomach and duodenum. No free air or free fluid identified. Vascular/Lymphatic: Extensive Aortoiliac calcified atherosclerosis. Normal caliber abdominal aorta. Major arterial structures are patent. Portal venous system timing is early, not evaluated. No lymphadenopathy  identified. Reproductive: Negative. Other: No pelvis free fluid. Musculoskeletal: Chronic L5 pars fractures with mild grade 1 anterolisthesis L5-S1. Similar mild degenerative anterolisthesis at L2-L3 with vacuum disc and facet arthropathy. No lumbar vertebral fracture identified. Sacrum, SI joints and pelvis appear intact. Proximal right femurs appear symmetric and intact. No superficial soft tissue injury identified. IMPRESSION: 1. Intermittent motion artifact. No acute traumatic injury identified in the abdomen or pelvis. 2. Cardiomegaly with small layering pleural effusions. 3. Cholelithiasis. Sigmoid diverticulosis. Aortic Atherosclerosis (ICD10-I70.0). Electronically Signed   By: Odessa Fleming M.D.   On: 10/01/2022 07:49   CT Cervical Spine Wo Contrast  Result Date: 10/01/2022 CLINICAL DATA:  89 year old female found down. EXAM: CT CERVICAL SPINE WITHOUT CONTRAST TECHNIQUE: Multidetector CT imaging of the cervical spine was performed without intravenous contrast. Multiplanar CT image reconstructions were also generated. RADIATION DOSE REDUCTION: This exam was performed according to the departmental dose-optimization program which includes automated exposure control, adjustment of the mA and/or kV according to patient size and/or use of iterative reconstruction technique. COMPARISON:  Head CT today.  CTA head  and neck 01/13/2019. FINDINGS: Alignment: Stable mild chronic reversal of cervical lordosis. Cervicothoracic junction alignment is within normal limits. Bilateral posterior element alignment is within normal limits. Skull base and vertebrae: Mild motion artifact. Visualized skull base is intact. No atlanto-occipital dissociation. C1 and C2 appear chronically degenerated but intact and aligned. No acute osseous abnormality identified. Soft tissues and spinal canal: No prevertebral fluid or swelling. No visible canal hematoma. Neck and pharynx motion artifact. Bulky calcified cervical carotid artery  atherosclerosis. Disc levels: Widespread cervical facet arthropathy and chronic facet ankylosis, C2 or C3 through the C5 level bilaterally. Chronic interbody ankylosis at C6-C7. Stable bulky adjacent C5-C6 degeneration. Upper chest: Mild motion artifact. Visible upper thoracic levels appear intact. Small to moderate layering pleural effusions are visible. Calcified aortic atherosclerosis. Mild apical pulmonary septal thickening. IMPRESSION: 1. No acute traumatic injury identified in the cervical spine. 2. Advanced cervical spine degeneration and widespread ankylosis. 3. Layering pleural effusions in the lung apices with mild pulmonary septal thickening. Consider interstitial edema. 4. Carotid and Aortic Atherosclerosis (ICD10-I70.0). Electronically Signed   By: Odessa Fleming M.D.   On: 10/01/2022 07:45   CT Head Wo Contrast  Result Date: 10/01/2022 CLINICAL DATA:  89 year old female found down. EXAM: CT HEAD WITHOUT CONTRAST TECHNIQUE: Contiguous axial images were obtained from the base of the skull through the vertex without intravenous contrast. RADIATION DOSE REDUCTION: This exam was performed according to the departmental dose-optimization program which includes automated exposure control, adjustment of the mA and/or kV according to patient size and/or use of iterative reconstruction technique. COMPARISON:  Head CT 08/19/2022. FINDINGS: Brain: Stable chronic encephalomalacia in the right parietal lobe and occipital pole. Stable cerebral volume. Confluent bilateral cerebral white matter hypodensity with discrete chronic lacunar infarct of the left corona radiata. No midline shift, ventriculomegaly, mass effect, evidence of mass lesion, intracranial hemorrhage or evidence of cortically based acute infarction. Vascular: No suspicious intracranial vascular hyperdensity. Calcified atherosclerosis at the skull base. Skull: Mild motion artifact. Stable visualized osseous structures. No acute osseous abnormality  identified. Sinuses/Orbits: Visualized paranasal sinuses and mastoids are stable and well aerated. Other: No discrete orbit or scalp soft tissue injury. IMPRESSION: Mild motion artifact. No acute intracranial abnormality or acute traumatic injury identified. Stable non contrast CT appearance of chronic ischemic disease. Electronically Signed   By: Odessa Fleming M.D.   On: 10/01/2022 07:41   DG Pelvis Portable  Result Date: 10/01/2022 CLINICAL DATA:  89 year old female with history of trauma from a fall. EXAM: PORTABLE PELVIS 1-2 VIEWS COMPARISON:  No priors. FINDINGS: Single oblique view of the pelvis is limited by nonstandard projection. There are lucencies extending through the right superior and inferior pubic rami, which appear related to prominent stool in the underlying rectum. No definite acute displaced fracture confidently identified. Bilateral proximal femurs as visualized appear intact, and the femoral heads project over the acetabula bilaterally. Joint space narrowing, subchondral sclerosis, subchondral cyst formation and osteophyte formation in the hip joints bilaterally (right greater than left), indicative of osteoarthritis. IMPRESSION: 1. Slightly limited study secondary to suboptimal patient positioning demonstrating no definite acute radiographic abnormality of the bony pelvis. 2. Bilateral hip joint osteoarthritis (severe on the right and moderate on the left). Electronically Signed   By: Trudie Reed M.D.   On: 10/01/2022 06:35   DG Chest Portable 1 View  Result Date: 10/01/2022 CLINICAL DATA:  89 year old female with history of trauma from a fall. EXAM: PORTABLE CHEST 1 VIEW COMPARISON:  Chest x-ray 09/02/2022. FINDINGS: Lung volumes are  normal. Opacity at the left base obscuring the left hemidiaphragm which may reflect atelectasis and/or consolidation, likely with superimposed small left pleural effusion. Probable small right pleural effusion also noted. No pneumothorax. No evidence of  pulmonary edema. Mild cardiomegaly. Upper mediastinal contours are within normal limits. Atherosclerotic calcifications are noted in the thoracic aorta. IMPRESSION: 1. Atelectasis and/or consolidation in the left lung base. 2. Small bilateral pleural effusions. 3. Mild cardiomegaly. 4. Aortic atherosclerosis. Electronically Signed   By: Trudie Reed M.D.   On: 10/01/2022 06:26    Microbiology: Results for orders placed or performed during the hospital encounter of 10/01/22  Urine Culture (for pregnant, neutropenic or urologic patients or patients with an indwelling urinary catheter)     Status: Abnormal   Collection Time: 10/01/22  7:34 AM   Specimen: Urine, Clean Catch  Result Value Ref Range Status   Specimen Description   Final    URINE, CLEAN CATCH Performed at [facility], [address] [place], [place] [address]    Special Requests   Final    NONE Performed at [facility], [address] [place], [place] [address]    Culture (A)  Final    80,000 COLONIES/mL AEROCOCCUS SPECIES Standardized susceptibility testing for this organism is not available. >=100,000 COLONIES/mL ENTEROCOCCUS FAECALIS    Report Status 10/03/2022 FINAL  Final   Organism ID, Bacteria ENTEROCOCCUS FAECALIS (A)  Final      Susceptibility   Enterococcus faecalis - MIC*    AMPICILLIN <=2 SENSITIVE Sensitive     NITROFURANTOIN <=16 SENSITIVE Sensitive     VANCOMYCIN 1 SENSITIVE Sensitive     * >=100,000 COLONIES/mL ENTEROCOCCUS FAECALIS    Labs: CBC: Recent Labs  Lab 10/16/22 2053 10/16/22 2113 10/17/22 0318  WBC 9.7  --  8.6  NEUTROABS  --   --  6.2  HGB 14.8 15.6* 14.6  HCT 48.2* 46.0 45.3  MCV 96.8  --  94.4  PLT 138*  --  137*   Basic Metabolic Panel: Recent Labs  Lab 10/16/22 2053 10/16/22 2113 10/17/22 0318 10/18/22 0444 10/19/22 0433  NA 139 140 138 139 140  K 4.3 4.2 3.6 3.4* 4.1  CL 102 105 102 105 102  CO2 23  --  24 25 27   GLUCOSE  135* 133* 121* 94 97  BUN 28* 29* 24* 24* 23  CREATININE 1.59* 1.60* 1.54* 1.28* 1.26*  CALCIUM 8.6*  --  8.2* 8.2* 8.2*  MG 1.9  --  1.8  --  1.8  PHOS  --   --  3.0  --   --    Liver Function Tests: Recent Labs  Lab 10/16/22 2053 10/17/22 0318  AST 52* 38  ALT 44 40  ALKPHOS 84 72  BILITOT 1.1 1.0  PROT 6.4* 5.8*  ALBUMIN 3.0* 2.9*   CBG: No results for input(s): "GLUCAP" in the last 168 hours.  Discharge time spent: greater than 30 minutes.  Signed: Coralie Keens, MD [facility] 10/19/2022

## 2022-10-19 NOTE — TOC Progression Note (Addendum)
Transition of Care [facility]) - Progression Note    Patient Details  Name: Amanda Cochran MRN: [id number] Date of Birth: 04-22-1928  [facility] CM/SW Contact  Mearl Latin, LCSW Phone Number: 10/19/2022, 10:42 AM  Clinical Narrative:    10:42 AM-CSW spoke with patient's nephew and made him aware of the other SNF bed offers. He stated he prefers for patient to return to [facility] and has spoken with them about modifications for patient. CSW updated [facility] and they can accept patient back today.     Expected Discharge Plan: Skilled Nursing Facility Barriers to Discharge: SNF Pending bed offer (pending family decision)  Expected Discharge Plan and Services In-house Referral: Clinical Social Work   Post Acute Care Choice: Skilled Nursing Facility Living arrangements for the past 2 months: Skilled Nursing Facility                                       Social Determinants of Health (SDOH) Interventions SDOH Screenings   Food Insecurity: No Food Insecurity (08/21/2022)  Housing: Low Risk  (08/21/2022)  Transportation Needs: No Transportation Needs (08/21/2022)  Recent Concern: Transportation Needs - Unmet Transportation Needs (08/20/2022)  Utilities: Not At Risk (08/21/2022)  Tobacco Use: Low Risk  (10/16/2022)    Readmission Risk Interventions     No data to display

## 2022-10-19 NOTE — Progress Notes (Signed)
Occupational Therapy Treatment Patient Details Name: Amanda Cochran MRN: [id number] DOB: Nov 11, 1928 Today's Date: 10/19/2022   History of present illness Pt is a 89 y.o. female admitted 8/23 following a fall. She struck R side of head. CT negative. Admitted with dx of acute on chronic systolic heart failure, AKI, and hypoxia. PMH: depression, h/o CVA (L BG infarct with R hemi), HLD, HTN, a fib, heart failure   OT comments  Patient with good progress toward patient focused goals.  More alert this session, moving better and following commands.  Patient needing Mod A and increased time for supine to sit, but standing with step pivot to recliner and Min A.  Overall Mod A for lower body ADL due to balance deficits and fear of falling, but progressing to sit to stand.  OT can continue efforts in the acute setting to address deficits, and Patient will benefit from continued inpatient follow up therapy, <3 hours/day       If plan is discharge home, recommend the following:  Assistance with cooking/housework;Assist for transportation;Help with stairs or ramp for entrance;Direct supervision/assist for financial management;Direct supervision/assist for medications management;A lot of help with bathing/dressing/bathroom   Equipment Recommendations  None recommended by OT    Recommendations for Other Services      Precautions / Restrictions Precautions Precautions: Fall Restrictions Weight Bearing Restrictions: No       Mobility Bed Mobility Overal bed mobility: Needs Assistance Bed Mobility: Supine to Sit     Supine to sit: Mod assist       Patient Response: Cooperative  Transfers Overall transfer level: Needs assistance Equipment used: 1 person hand held assist Transfers: Sit to/from Stand, Bed to chair/wheelchair/BSC Sit to Stand: Min assist     Step pivot transfers: Min assist           Balance Overall balance assessment: Needs assistance Sitting-balance support: Feet  supported, Bilateral upper extremity supported Sitting balance-Leahy Scale: Fair     Standing balance support: Bilateral upper extremity supported Standing balance-Leahy Scale: Poor                             ADL either performed or assessed with clinical judgement   ADL       Grooming: Wash/dry face;Wash/dry hands;Sitting;Set up   Upper Body Bathing: Minimal assistance;Sitting   Lower Body Bathing: Sit to/from stand;Moderate assistance   Upper Body Dressing : Minimal assistance;Sitting   Lower Body Dressing: Moderate assistance;Sit to/from stand;Maximal assistance   Toilet Transfer: Minimal assistance                  Extremity/Trunk Assessment Upper Extremity Assessment Upper Extremity Assessment: Generalized weakness   Lower Extremity Assessment Lower Extremity Assessment: Defer to PT evaluation   Cervical / Trunk Assessment Cervical / Trunk Assessment: Kyphotic    Vision Patient Visual Report: No change from baseline     Perception Perception Perception: Not tested   Praxis      Cognition Arousal: Alert Behavior During Therapy: [facility] for tasks assessed/performed Overall Cognitive Status: Within Functional Limits for tasks assessed                                                             Pertinent Vitals/ Pain  Pain Assessment Faces Pain Scale: Hurts a little bit Pain Location: generalized with movement Pain Descriptors / Indicators: Grimacing Pain Intervention(s): Monitored during session                                                          Frequency  Min 1X/week        Progress Toward Goals  OT Goals(current goals can now be found in the care plan section)  Progress towards OT goals: Progressing toward goals  Acute Rehab OT Goals OT Goal Formulation: With patient Time For Goal Achievement: 10/31/22 Potential to Achieve Goals: Good  Plan       Co-evaluation                 AM-PAC OT "6 Clicks" Daily Activity     Outcome Measure   Help from another person eating meals?: None Help from another person taking care of personal grooming?: None Help from another person toileting, which includes using toliet, bedpan, or urinal?: A Lot Help from another person bathing (including washing, rinsing, drying)?: A Lot Help from another person to put on and taking off regular upper body clothing?: A Lot Help from another person to put on and taking off regular lower body clothing?: A Lot 6 Click Score: 16    End of Session Equipment Utilized During Treatment: Gait belt  OT Visit Diagnosis: Unsteadiness on feet (R26.81);Muscle weakness (generalized) (M62.81)   Activity Tolerance Patient tolerated treatment well   Patient Left in chair;with call bell/phone within reach   Nurse Communication Mobility status        Time: 0935-1006 OT Time Calculation (min): 31 min  Charges: OT General Charges $OT Visit: 1 Visit OT Treatments $Self Care/Home Management : 23-37 mins  10/19/2022  RP, OTR/L  Acute Rehabilitation Services  Office:  [phone]   Suzanna Obey 10/19/2022, 10:10 AM

## 2022-10-19 NOTE — Plan of Care (Signed)

## 2022-10-19 NOTE — Progress Notes (Signed)
Mobility Specialist Progress Note:    10/19/22 1548  Mobility  Activity Transferred from chair to bed  Level of Assistance Minimal assist, patient does 75% or more  Assistive Device Front wheel walker  Distance Ambulated (ft) 6 ft  Activity Response Tolerated well  $Mobility charge 1 Mobility  Mobility Specialist Start Time (ACUTE ONLY) 1530  Mobility Specialist Stop Time (ACUTE ONLY) 1545  Mobility Specialist Time Calculation (min) (ACUTE ONLY) 15 min   Pt received in chair requesting to get back to bed. Pt needed MinA for STS and contact guard during transfer. Pt was able to take a couple steps towards the chair w/ max verbal cues, d/t hard of hearing. Situated in bed w/ all needs met.     Thompson Grayer Mobility Specialist  Please contact vis Secure Chat or  Rehab Office [phone]

## 2022-10-19 NOTE — TOC Transition Note (Addendum)
Transition of Care [facility]) - CM/SW Discharge Note   Patient Details  Name: Amanda Cochran MRN: [id number] Date of Birth: 08/27/1928  [facility] of [facility] CM/SW Contact:  Mearl Latin, LCSW Phone Number: 10/19/2022, 2:24 PM   Clinical Narrative:    Patient will DC to: [facility] Anticipated DC date: 10/19/22 Family notified: Coolidge Breeze Transport by: Sharin Mons  Per [facility]) patient has a POA that states she is a DNR. CSW provided email address and patient's lawyer will send paperwork for the record. CSW made MD aware to see if DNR needs to be signed for transport.   Update: DNR signed by MD and placed in packet. CSW received POA paperwork via email and printed and placed in patient's hard chart for medical records.   Per MD patient ready for DC to [facility]. RN to call report prior to discharge [phone] room [phone]). RN, patient, patient's family, and facility notified of DC. Discharge Summary and FL2 sent to facility. DC packet on chart. Ambulance transport requested for patient.   CSW will sign off for now as social work intervention is no longer needed. Please consult [place] again if new needs arise.     Final next level of care: Skilled Nursing Facility Barriers to Discharge: Barriers Resolved   Patient Goals and CMS Choice CMS Medicare.gov Compare Post Acute Care list provided to:: Patient Represenative (must comment) Choice offered to / list presented to :  (Nephew)  Discharge Placement     Existing PASRR number confirmed : 10/19/22          [facility] Patient to be transferred to facility by: [facility] Name of family member notified: Coolidge Breeze Patient and family notified of of transfer: 10/19/22  Discharge Plan and Services Additional resources added to the After Visit Summary for   In-house Referral: Clinical Social Work   Post Acute Care Choice: Skilled Nursing Facility                                Social Determinants of Health (SDOH) Interventions SDOH Screenings   Food Insecurity: No Food Insecurity (08/21/2022)  Housing: Low Risk  (08/21/2022)  Transportation Needs: No Transportation Needs (08/21/2022)  Recent Concern: Transportation Needs - Unmet Transportation Needs (08/20/2022)  Utilities: Not At Risk (08/21/2022)  Tobacco Use: Low Risk  (10/16/2022)     Readmission Risk Interventions     No data to display

## 2022-10-19 NOTE — Progress Notes (Signed)
Heart Failure Navigator Progress Note  Assessed for Heart & Vascular TOC clinic readiness.  Patient does not meet criteria due to cognitive impairment per Dr. Ella Jubilee.   Navigator will sign off at this time.   Randell Teare,RN, BSN,MSN Heart Failure Nurse Navigator. Contact by secure chat only.

## 2022-10-19 NOTE — Consult Note (Signed)
   [facility] CM Inpatient Consult   10/19/2022  Amanda Cochran [birth year] [id number]  [facility] [[facility]]  Accountable Care Organization [ACO] Patient: Medicare ACO REACH  Primary Care Provider: [name], MD with [facility]     Patient was reviewed for less than 30 days readmission rising to high risk score barriers to care in community. Patient was screened for hospitalization and on behalf of [facility] with [facility] Care Coordination to assess for post hospital community care needs.  Patient is being considered to return to a skilled nursing facility level of care for post hospital transition. Patient's electronic medical record reviewed for post hospital recommendations and needs.  If the patient goes to a [facility] affiliated facility then, patient can be followed by [facility] RN with traditional Medicare and approved Medicare Advantage plans.    Plan:   Can alert the Community [facility] RN can follow for any known or needs for transitional care needs for returning to post facility care coordination needs to return to community.  For questions or referrals, please contact:   Charlesetta Shanks, RN BSN CCM [facility]  [phone] business mobile phone Toll free office [phone]  Fax number: [phone] [email or web address]

## 2022-10-20 DIAGNOSIS — N1831 Chronic kidney disease, stage 3a: Secondary | ICD-10-CM | POA: Diagnosis not present

## 2022-10-20 DIAGNOSIS — I4891 Unspecified atrial fibrillation: Secondary | ICD-10-CM | POA: Diagnosis not present

## 2022-10-20 DIAGNOSIS — G3184 Mild cognitive impairment, so stated: Secondary | ICD-10-CM | POA: Diagnosis not present

## 2022-10-20 DIAGNOSIS — G8191 Hemiplegia, unspecified affecting right dominant side: Secondary | ICD-10-CM | POA: Diagnosis not present

## 2022-10-20 DIAGNOSIS — M150 Primary generalized (osteo)arthritis: Secondary | ICD-10-CM | POA: Diagnosis not present

## 2022-10-20 DIAGNOSIS — G9340 Encephalopathy, unspecified: Secondary | ICD-10-CM | POA: Diagnosis not present

## 2022-10-20 DIAGNOSIS — I5022 Chronic systolic (congestive) heart failure: Secondary | ICD-10-CM | POA: Diagnosis not present

## 2022-10-20 DIAGNOSIS — I129 Hypertensive chronic kidney disease with stage 1 through stage 4 chronic kidney disease, or unspecified chronic kidney disease: Secondary | ICD-10-CM | POA: Diagnosis not present

## 2022-10-20 DIAGNOSIS — E039 Hypothyroidism, unspecified: Secondary | ICD-10-CM | POA: Diagnosis not present

## 2022-10-20 DIAGNOSIS — I10 Essential (primary) hypertension: Secondary | ICD-10-CM | POA: Diagnosis not present

## 2022-10-20 DIAGNOSIS — I639 Cerebral infarction, unspecified: Secondary | ICD-10-CM | POA: Diagnosis not present

## 2022-10-20 DIAGNOSIS — E785 Hyperlipidemia, unspecified: Secondary | ICD-10-CM | POA: Diagnosis not present

## 2022-10-23 DIAGNOSIS — G309 Alzheimer's disease, unspecified: Secondary | ICD-10-CM | POA: Diagnosis not present

## 2022-10-23 DIAGNOSIS — I480 Paroxysmal atrial fibrillation: Secondary | ICD-10-CM | POA: Diagnosis not present

## 2022-10-23 DIAGNOSIS — W19XXXA Unspecified fall, initial encounter: Secondary | ICD-10-CM | POA: Diagnosis not present

## 2022-10-23 DIAGNOSIS — E039 Hypothyroidism, unspecified: Secondary | ICD-10-CM | POA: Diagnosis not present

## 2022-10-27 DIAGNOSIS — I5022 Chronic systolic (congestive) heart failure: Secondary | ICD-10-CM | POA: Diagnosis not present

## 2022-10-27 DIAGNOSIS — I4891 Unspecified atrial fibrillation: Secondary | ICD-10-CM | POA: Diagnosis not present

## 2022-10-27 DIAGNOSIS — G3184 Mild cognitive impairment, so stated: Secondary | ICD-10-CM | POA: Diagnosis not present

## 2022-10-27 DIAGNOSIS — I129 Hypertensive chronic kidney disease with stage 1 through stage 4 chronic kidney disease, or unspecified chronic kidney disease: Secondary | ICD-10-CM | POA: Diagnosis not present

## 2022-10-27 DIAGNOSIS — E440 Moderate protein-calorie malnutrition: Secondary | ICD-10-CM | POA: Diagnosis not present

## 2022-10-27 DIAGNOSIS — N1831 Chronic kidney disease, stage 3a: Secondary | ICD-10-CM | POA: Diagnosis not present

## 2022-11-04 ENCOUNTER — Ambulatory Visit (INDEPENDENT_AMBULATORY_CARE_PROVIDER_SITE_OTHER): Payer: Medicare Other | Admitting: Podiatry

## 2022-11-04 ENCOUNTER — Encounter: Payer: Self-pay | Admitting: Podiatry

## 2022-11-04 DIAGNOSIS — L84 Corns and callosities: Secondary | ICD-10-CM | POA: Diagnosis not present

## 2022-11-04 DIAGNOSIS — I129 Hypertensive chronic kidney disease with stage 1 through stage 4 chronic kidney disease, or unspecified chronic kidney disease: Secondary | ICD-10-CM | POA: Diagnosis not present

## 2022-11-04 DIAGNOSIS — E440 Moderate protein-calorie malnutrition: Secondary | ICD-10-CM | POA: Diagnosis not present

## 2022-11-04 DIAGNOSIS — I5022 Chronic systolic (congestive) heart failure: Secondary | ICD-10-CM | POA: Diagnosis not present

## 2022-11-04 DIAGNOSIS — Z87898 Personal history of other specified conditions: Secondary | ICD-10-CM | POA: Diagnosis not present

## 2022-11-04 DIAGNOSIS — F411 Generalized anxiety disorder: Secondary | ICD-10-CM | POA: Diagnosis not present

## 2022-11-04 DIAGNOSIS — G3184 Mild cognitive impairment, so stated: Secondary | ICD-10-CM | POA: Diagnosis not present

## 2022-11-04 NOTE — Progress Notes (Addendum)
  Subjective:  Patient ID: Amanda Cochran, female    DOB: 1928-06-06,   MRN: [id number]  No chief complaint on file.   89 y.o. female presents for follow-up of bilateral foot wounds. Relates doing well. Here today with aid.   Denies any other pedal complaints. Denies n/v/f/c.   Past Medical History:  Diagnosis Date   Arthritis    Bladder infection    Blindness    Chronic systolic heart failure (HCC)    CVA (cerebral infarction) 02/07/2014   Left basal ganglia infarct with right hemiparesis    Glaucoma    Glaucoma    Glaucoma (increased eye pressure)    Hyperlipidemia    Hypertension    Left sided lacunar stroke (HCC) 02/14/2014   Paroxysmal atrial fibrillation (HCC)    Right hemiparesis (HCC) 02/09/2014   Left basal ganglia infarct Dec 2015    Stroke [facility])    Thyroid disease     Objective:  Physical Exam: Vascular: DP/PT pulses 2/4 bilateral. CFT <3 seconds. Normal hair growth on digits. No edema.  Skin. No lacerations or abrasions bilateral feet. Hyperkeratotic lesions noted to bilateral fifth metatarsal heads and first metatrsal head on the left. Upon debridement ulcerations healed.  Musculoskeletal: MMT 5/5 bilateral lower extremities in DF, PF, Inversion and Eversion. Deceased ROM in DF of ankle joint.  Neurological: Sensation intact to light touch.   Assessment:   1. Pre-ulcerative calluses   2. History of ulceration      Plan:  Patient was evaluated and treated and all questions answered. Ulcer plantar fifth metatarasal on right healed. , left fifth mettarsal healed and first mettarsal on left -healed.  -Debridement of hyperkeratotic tissue with underlying wounds healed.  -No abx indicated.  -Discussed glucose control and proper protein-rich diet.  -Discussed if any worsening redness, pain, fever or chills to call or may need to report to the emergency room. Patient expressed understanding.   Return in 3 months for rfc.   Return in about 3 months (around  02/03/2023) for rfc.   Louann Sjogren, DPM

## 2022-11-12 DIAGNOSIS — F411 Generalized anxiety disorder: Secondary | ICD-10-CM | POA: Diagnosis not present

## 2022-11-12 DIAGNOSIS — G3184 Mild cognitive impairment, so stated: Secondary | ICD-10-CM | POA: Diagnosis not present

## 2022-11-12 DIAGNOSIS — I5022 Chronic systolic (congestive) heart failure: Secondary | ICD-10-CM | POA: Diagnosis not present

## 2022-11-12 DIAGNOSIS — I129 Hypertensive chronic kidney disease with stage 1 through stage 4 chronic kidney disease, or unspecified chronic kidney disease: Secondary | ICD-10-CM | POA: Diagnosis not present

## 2022-11-12 DIAGNOSIS — I4891 Unspecified atrial fibrillation: Secondary | ICD-10-CM | POA: Diagnosis not present

## 2022-11-23 DIAGNOSIS — R41841 Cognitive communication deficit: Secondary | ICD-10-CM | POA: Diagnosis not present

## 2022-11-23 DIAGNOSIS — M6281 Muscle weakness (generalized): Secondary | ICD-10-CM | POA: Diagnosis not present

## 2022-11-23 DIAGNOSIS — G9341 Metabolic encephalopathy: Secondary | ICD-10-CM | POA: Diagnosis not present

## 2022-11-23 DIAGNOSIS — I480 Paroxysmal atrial fibrillation: Secondary | ICD-10-CM | POA: Diagnosis not present

## 2022-11-23 DIAGNOSIS — F05 Delirium due to known physiological condition: Secondary | ICD-10-CM | POA: Diagnosis not present

## 2022-11-23 DIAGNOSIS — I69351 Hemiplegia and hemiparesis following cerebral infarction affecting right dominant side: Secondary | ICD-10-CM | POA: Diagnosis not present

## 2022-11-23 DIAGNOSIS — I10 Essential (primary) hypertension: Secondary | ICD-10-CM | POA: Diagnosis not present

## 2022-11-23 DIAGNOSIS — F411 Generalized anxiety disorder: Secondary | ICD-10-CM | POA: Diagnosis not present

## 2022-11-23 DIAGNOSIS — R2689 Other abnormalities of gait and mobility: Secondary | ICD-10-CM | POA: Diagnosis not present

## 2022-11-23 DIAGNOSIS — H353130 Nonexudative age-related macular degeneration, bilateral, stage unspecified: Secondary | ICD-10-CM | POA: Diagnosis not present

## 2022-11-23 DIAGNOSIS — N390 Urinary tract infection, site not specified: Secondary | ICD-10-CM | POA: Diagnosis not present

## 2022-11-23 DIAGNOSIS — J188 Other pneumonia, unspecified organism: Secondary | ICD-10-CM | POA: Diagnosis not present

## 2022-11-23 NOTE — Progress Notes (Signed)
11/23/22 CSW received call from patient's nephew, Thayer [facility]) requesting help getting a letter stating patient lacks capacity so he can utilize his HCPOA to pay her bills while she is in SNF. CSW directed him to request medical records from her July hospital visit for the psych notes from that admission. CSW also recommended he utilize patient's PCP or the MD at [facility] for updated capacity info since patient is not hospitalized.  Joaquin Courts, MSW, [facility]

## 2022-12-15 DIAGNOSIS — I69351 Hemiplegia and hemiparesis following cerebral infarction affecting right dominant side: Secondary | ICD-10-CM | POA: Diagnosis not present

## 2022-12-15 DIAGNOSIS — G9341 Metabolic encephalopathy: Secondary | ICD-10-CM | POA: Diagnosis not present

## 2022-12-15 DIAGNOSIS — M6281 Muscle weakness (generalized): Secondary | ICD-10-CM | POA: Diagnosis not present

## 2022-12-15 DIAGNOSIS — J188 Other pneumonia, unspecified organism: Secondary | ICD-10-CM | POA: Diagnosis not present

## 2022-12-15 DIAGNOSIS — I10 Essential (primary) hypertension: Secondary | ICD-10-CM | POA: Diagnosis not present

## 2022-12-15 DIAGNOSIS — I480 Paroxysmal atrial fibrillation: Secondary | ICD-10-CM | POA: Diagnosis not present

## 2022-12-15 DIAGNOSIS — R2689 Other abnormalities of gait and mobility: Secondary | ICD-10-CM | POA: Diagnosis not present

## 2022-12-15 DIAGNOSIS — H353130 Nonexudative age-related macular degeneration, bilateral, stage unspecified: Secondary | ICD-10-CM | POA: Diagnosis not present

## 2022-12-15 DIAGNOSIS — N390 Urinary tract infection, site not specified: Secondary | ICD-10-CM | POA: Diagnosis not present

## 2022-12-15 DIAGNOSIS — R41841 Cognitive communication deficit: Secondary | ICD-10-CM | POA: Diagnosis not present

## 2022-12-15 DIAGNOSIS — F411 Generalized anxiety disorder: Secondary | ICD-10-CM | POA: Diagnosis not present

## 2022-12-15 DIAGNOSIS — F05 Delirium due to known physiological condition: Secondary | ICD-10-CM | POA: Diagnosis not present

## 2022-12-19 DIAGNOSIS — F05 Delirium due to known physiological condition: Secondary | ICD-10-CM | POA: Diagnosis not present

## 2022-12-19 DIAGNOSIS — F411 Generalized anxiety disorder: Secondary | ICD-10-CM | POA: Diagnosis not present

## 2022-12-19 DIAGNOSIS — R2689 Other abnormalities of gait and mobility: Secondary | ICD-10-CM | POA: Diagnosis not present

## 2022-12-19 DIAGNOSIS — M6281 Muscle weakness (generalized): Secondary | ICD-10-CM | POA: Diagnosis not present

## 2022-12-19 DIAGNOSIS — R41841 Cognitive communication deficit: Secondary | ICD-10-CM | POA: Diagnosis not present

## 2022-12-19 DIAGNOSIS — I10 Essential (primary) hypertension: Secondary | ICD-10-CM | POA: Diagnosis not present

## 2022-12-19 DIAGNOSIS — G9341 Metabolic encephalopathy: Secondary | ICD-10-CM | POA: Diagnosis not present

## 2022-12-19 DIAGNOSIS — I69351 Hemiplegia and hemiparesis following cerebral infarction affecting right dominant side: Secondary | ICD-10-CM | POA: Diagnosis not present

## 2022-12-19 DIAGNOSIS — H353130 Nonexudative age-related macular degeneration, bilateral, stage unspecified: Secondary | ICD-10-CM | POA: Diagnosis not present

## 2022-12-19 DIAGNOSIS — J188 Other pneumonia, unspecified organism: Secondary | ICD-10-CM | POA: Diagnosis not present

## 2022-12-19 DIAGNOSIS — N390 Urinary tract infection, site not specified: Secondary | ICD-10-CM | POA: Diagnosis not present

## 2022-12-19 DIAGNOSIS — I480 Paroxysmal atrial fibrillation: Secondary | ICD-10-CM | POA: Diagnosis not present

## 2022-12-23 DIAGNOSIS — R41841 Cognitive communication deficit: Secondary | ICD-10-CM | POA: Diagnosis not present

## 2022-12-23 DIAGNOSIS — F05 Delirium due to known physiological condition: Secondary | ICD-10-CM | POA: Diagnosis not present

## 2022-12-23 DIAGNOSIS — N390 Urinary tract infection, site not specified: Secondary | ICD-10-CM | POA: Diagnosis not present

## 2022-12-23 DIAGNOSIS — I10 Essential (primary) hypertension: Secondary | ICD-10-CM | POA: Diagnosis not present

## 2022-12-23 DIAGNOSIS — R2689 Other abnormalities of gait and mobility: Secondary | ICD-10-CM | POA: Diagnosis not present

## 2022-12-23 DIAGNOSIS — H353130 Nonexudative age-related macular degeneration, bilateral, stage unspecified: Secondary | ICD-10-CM | POA: Diagnosis not present

## 2022-12-23 DIAGNOSIS — M6281 Muscle weakness (generalized): Secondary | ICD-10-CM | POA: Diagnosis not present

## 2022-12-23 DIAGNOSIS — J188 Other pneumonia, unspecified organism: Secondary | ICD-10-CM | POA: Diagnosis not present

## 2022-12-23 DIAGNOSIS — G9341 Metabolic encephalopathy: Secondary | ICD-10-CM | POA: Diagnosis not present

## 2022-12-23 DIAGNOSIS — I480 Paroxysmal atrial fibrillation: Secondary | ICD-10-CM | POA: Diagnosis not present

## 2022-12-23 DIAGNOSIS — F411 Generalized anxiety disorder: Secondary | ICD-10-CM | POA: Diagnosis not present

## 2022-12-23 DIAGNOSIS — I69351 Hemiplegia and hemiparesis following cerebral infarction affecting right dominant side: Secondary | ICD-10-CM | POA: Diagnosis not present

## 2022-12-28 DIAGNOSIS — R2689 Other abnormalities of gait and mobility: Secondary | ICD-10-CM | POA: Diagnosis not present

## 2022-12-28 DIAGNOSIS — G9341 Metabolic encephalopathy: Secondary | ICD-10-CM | POA: Diagnosis not present

## 2022-12-28 DIAGNOSIS — R41841 Cognitive communication deficit: Secondary | ICD-10-CM | POA: Diagnosis not present

## 2022-12-28 DIAGNOSIS — R1312 Dysphagia, oropharyngeal phase: Secondary | ICD-10-CM | POA: Diagnosis not present

## 2022-12-28 DIAGNOSIS — M6281 Muscle weakness (generalized): Secondary | ICD-10-CM | POA: Diagnosis not present

## 2022-12-29 DIAGNOSIS — E785 Hyperlipidemia, unspecified: Secondary | ICD-10-CM | POA: Diagnosis not present

## 2022-12-29 DIAGNOSIS — B372 Candidiasis of skin and nail: Secondary | ICD-10-CM | POA: Diagnosis not present

## 2022-12-29 DIAGNOSIS — G3184 Mild cognitive impairment, so stated: Secondary | ICD-10-CM | POA: Diagnosis not present

## 2022-12-29 DIAGNOSIS — E039 Hypothyroidism, unspecified: Secondary | ICD-10-CM | POA: Diagnosis not present

## 2023-01-04 DIAGNOSIS — I10 Essential (primary) hypertension: Secondary | ICD-10-CM | POA: Diagnosis not present

## 2023-01-04 DIAGNOSIS — G9341 Metabolic encephalopathy: Secondary | ICD-10-CM | POA: Diagnosis not present

## 2023-01-04 DIAGNOSIS — R2689 Other abnormalities of gait and mobility: Secondary | ICD-10-CM | POA: Diagnosis not present

## 2023-01-04 DIAGNOSIS — R41841 Cognitive communication deficit: Secondary | ICD-10-CM | POA: Diagnosis not present

## 2023-01-04 DIAGNOSIS — J188 Other pneumonia, unspecified organism: Secondary | ICD-10-CM | POA: Diagnosis not present

## 2023-01-04 DIAGNOSIS — I69351 Hemiplegia and hemiparesis following cerebral infarction affecting right dominant side: Secondary | ICD-10-CM | POA: Diagnosis not present

## 2023-01-04 DIAGNOSIS — M6281 Muscle weakness (generalized): Secondary | ICD-10-CM | POA: Diagnosis not present

## 2023-01-04 DIAGNOSIS — N390 Urinary tract infection, site not specified: Secondary | ICD-10-CM | POA: Diagnosis not present

## 2023-01-04 DIAGNOSIS — I480 Paroxysmal atrial fibrillation: Secondary | ICD-10-CM | POA: Diagnosis not present

## 2023-01-04 DIAGNOSIS — H353130 Nonexudative age-related macular degeneration, bilateral, stage unspecified: Secondary | ICD-10-CM | POA: Diagnosis not present

## 2023-01-04 DIAGNOSIS — F05 Delirium due to known physiological condition: Secondary | ICD-10-CM | POA: Diagnosis not present

## 2023-01-04 DIAGNOSIS — F411 Generalized anxiety disorder: Secondary | ICD-10-CM | POA: Diagnosis not present

## 2023-01-05 DIAGNOSIS — F05 Delirium due to known physiological condition: Secondary | ICD-10-CM | POA: Diagnosis not present

## 2023-01-05 DIAGNOSIS — J188 Other pneumonia, unspecified organism: Secondary | ICD-10-CM | POA: Diagnosis not present

## 2023-01-05 DIAGNOSIS — F411 Generalized anxiety disorder: Secondary | ICD-10-CM | POA: Diagnosis not present

## 2023-01-05 DIAGNOSIS — H353130 Nonexudative age-related macular degeneration, bilateral, stage unspecified: Secondary | ICD-10-CM | POA: Diagnosis not present

## 2023-01-05 DIAGNOSIS — R2689 Other abnormalities of gait and mobility: Secondary | ICD-10-CM | POA: Diagnosis not present

## 2023-01-05 DIAGNOSIS — I10 Essential (primary) hypertension: Secondary | ICD-10-CM | POA: Diagnosis not present

## 2023-01-05 DIAGNOSIS — G9341 Metabolic encephalopathy: Secondary | ICD-10-CM | POA: Diagnosis not present

## 2023-01-05 DIAGNOSIS — R41841 Cognitive communication deficit: Secondary | ICD-10-CM | POA: Diagnosis not present

## 2023-01-05 DIAGNOSIS — I480 Paroxysmal atrial fibrillation: Secondary | ICD-10-CM | POA: Diagnosis not present

## 2023-01-05 DIAGNOSIS — M6281 Muscle weakness (generalized): Secondary | ICD-10-CM | POA: Diagnosis not present

## 2023-01-05 DIAGNOSIS — N390 Urinary tract infection, site not specified: Secondary | ICD-10-CM | POA: Diagnosis not present

## 2023-01-05 DIAGNOSIS — I69351 Hemiplegia and hemiparesis following cerebral infarction affecting right dominant side: Secondary | ICD-10-CM | POA: Diagnosis not present

## 2023-01-07 DIAGNOSIS — E039 Hypothyroidism, unspecified: Secondary | ICD-10-CM | POA: Diagnosis not present

## 2023-01-24 DEATH — deceased

## 2023-02-03 ENCOUNTER — Ambulatory Visit (INDEPENDENT_AMBULATORY_CARE_PROVIDER_SITE_OTHER): Payer: Medicare Other | Admitting: Podiatry

## 2023-02-03 DIAGNOSIS — Z91199 Patient's noncompliance with other medical treatment and regimen due to unspecified reason: Secondary | ICD-10-CM

## 2023-02-03 NOTE — Progress Notes (Signed)
No show
